# Patient Record
Sex: Female | Born: 1965
Health system: Southern US, Community
[De-identification: ages and names within clinical notes are randomized; demographics above are authoritative.]

## PROBLEM LIST (undated history)

## (undated) DIAGNOSIS — I10 Essential (primary) hypertension: Secondary | ICD-10-CM

## (undated) DIAGNOSIS — F329 Major depressive disorder, single episode, unspecified: Secondary | ICD-10-CM

## (undated) DIAGNOSIS — E669 Obesity, unspecified: Secondary | ICD-10-CM

## (undated) DIAGNOSIS — F32A Depression, unspecified: Secondary | ICD-10-CM

## (undated) DIAGNOSIS — M25559 Pain in unspecified hip: Secondary | ICD-10-CM

## (undated) DIAGNOSIS — Z9889 Other specified postprocedural states: Secondary | ICD-10-CM

## (undated) DIAGNOSIS — Z973 Presence of spectacles and contact lenses: Secondary | ICD-10-CM

## (undated) DIAGNOSIS — I219 Acute myocardial infarction, unspecified: Secondary | ICD-10-CM

## (undated) DIAGNOSIS — R7303 Prediabetes: Secondary | ICD-10-CM

## (undated) DIAGNOSIS — R112 Nausea with vomiting, unspecified: Secondary | ICD-10-CM

## (undated) DIAGNOSIS — R609 Edema, unspecified: Secondary | ICD-10-CM

## (undated) DIAGNOSIS — M549 Dorsalgia, unspecified: Secondary | ICD-10-CM

## (undated) DIAGNOSIS — C801 Malignant (primary) neoplasm, unspecified: Secondary | ICD-10-CM

## (undated) DIAGNOSIS — I2510 Atherosclerotic heart disease of native coronary artery without angina pectoris: Secondary | ICD-10-CM

## (undated) HISTORY — DX: Dorsalgia, unspecified: M54.9

## (undated) HISTORY — PX: COLONOSCOPY: SHX174

## (undated) HISTORY — DX: Pain in unspecified hip: M25.559

## (undated) HISTORY — DX: Prediabetes: R73.03

## (undated) HISTORY — DX: Essential (primary) hypertension: I10

## (undated) HISTORY — PX: CHOLECYSTECTOMY: SHX55

## (undated) HISTORY — DX: Edema, unspecified: R60.9

## (undated) HISTORY — PX: GASTRIC BYPASS: SHX52

## (undated) HISTORY — PX: DILATION AND CURETTAGE OF UTERUS: SHX78

## (undated) HISTORY — DX: Obesity, unspecified: E66.9

---
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
## 1997-04-16 ENCOUNTER — Other Ambulatory Visit: Admission: RE | Admit: 1997-04-16 | Discharge: 1997-04-16 | Payer: Self-pay | Admitting: Obstetrics & Gynecology

## 1997-04-17 ENCOUNTER — Ambulatory Visit (HOSPITAL_COMMUNITY): Admission: RE | Admit: 1997-04-17 | Discharge: 1997-04-17 | Payer: Self-pay | Admitting: Obstetrics & Gynecology

## 1997-09-13 ENCOUNTER — Emergency Department (HOSPITAL_COMMUNITY): Admission: EM | Admit: 1997-09-13 | Discharge: 1997-09-13 | Payer: Self-pay | Admitting: Emergency Medicine

## 1998-05-21 ENCOUNTER — Other Ambulatory Visit: Admission: RE | Admit: 1998-05-21 | Discharge: 1998-05-21 | Payer: Self-pay | Admitting: Obstetrics and Gynecology

## 1998-05-29 ENCOUNTER — Emergency Department (HOSPITAL_COMMUNITY): Admission: EM | Admit: 1998-05-29 | Discharge: 1998-05-29 | Payer: Self-pay | Admitting: Emergency Medicine

## 1999-05-18 ENCOUNTER — Other Ambulatory Visit: Admission: RE | Admit: 1999-05-18 | Discharge: 1999-05-18 | Payer: Self-pay | Admitting: Obstetrics and Gynecology

## 1999-09-07 ENCOUNTER — Emergency Department (HOSPITAL_COMMUNITY): Admission: EM | Admit: 1999-09-07 | Discharge: 1999-09-07 | Payer: Self-pay | Admitting: Emergency Medicine

## 1999-09-07 ENCOUNTER — Encounter: Payer: Self-pay | Admitting: Emergency Medicine

## 1999-10-28 ENCOUNTER — Encounter: Payer: Self-pay | Admitting: Orthopedic Surgery

## 1999-10-28 ENCOUNTER — Encounter: Admission: RE | Admit: 1999-10-28 | Discharge: 1999-10-28 | Payer: Self-pay | Admitting: Orthopedic Surgery

## 2000-01-03 HISTORY — PX: KNEE SURGERY: SHX244

## 2000-05-11 ENCOUNTER — Encounter: Admission: RE | Admit: 2000-05-11 | Discharge: 2000-05-11 | Payer: Self-pay | Admitting: Family Medicine

## 2000-05-11 ENCOUNTER — Encounter: Payer: Self-pay | Admitting: Family Medicine

## 2000-06-15 ENCOUNTER — Encounter (INDEPENDENT_AMBULATORY_CARE_PROVIDER_SITE_OTHER): Payer: Self-pay | Admitting: Specialist

## 2000-06-15 ENCOUNTER — Observation Stay (HOSPITAL_COMMUNITY): Admission: RE | Admit: 2000-06-15 | Discharge: 2000-06-16 | Payer: Self-pay | Admitting: Surgery

## 2001-09-16 ENCOUNTER — Other Ambulatory Visit: Admission: RE | Admit: 2001-09-16 | Discharge: 2001-09-16 | Payer: Self-pay | Admitting: Obstetrics and Gynecology

## 2002-07-09 ENCOUNTER — Encounter: Admission: RE | Admit: 2002-07-09 | Discharge: 2002-07-09 | Payer: Self-pay | Admitting: *Deleted

## 2002-07-21 ENCOUNTER — Encounter: Admission: RE | Admit: 2002-07-21 | Discharge: 2002-10-19 | Payer: Self-pay | Admitting: *Deleted

## 2002-10-13 ENCOUNTER — Other Ambulatory Visit: Admission: RE | Admit: 2002-10-13 | Discharge: 2002-10-13 | Payer: Self-pay | Admitting: Obstetrics and Gynecology

## 2003-05-25 ENCOUNTER — Encounter: Admission: RE | Admit: 2003-05-25 | Discharge: 2003-08-23 | Payer: Self-pay | Admitting: *Deleted

## 2003-06-08 ENCOUNTER — Inpatient Hospital Stay (HOSPITAL_COMMUNITY): Admission: RE | Admit: 2003-06-08 | Discharge: 2003-06-10 | Payer: Self-pay | Admitting: *Deleted

## 2003-10-28 ENCOUNTER — Encounter: Admission: RE | Admit: 2003-10-28 | Discharge: 2003-10-28 | Payer: Self-pay | Admitting: *Deleted

## 2004-02-02 ENCOUNTER — Encounter: Admission: RE | Admit: 2004-02-02 | Discharge: 2004-05-02 | Payer: Self-pay | Admitting: *Deleted

## 2004-03-29 ENCOUNTER — Other Ambulatory Visit: Admission: RE | Admit: 2004-03-29 | Discharge: 2004-03-29 | Payer: Self-pay | Admitting: Obstetrics and Gynecology

## 2004-04-22 ENCOUNTER — Emergency Department (HOSPITAL_COMMUNITY): Admission: EM | Admit: 2004-04-22 | Discharge: 2004-04-22 | Payer: Self-pay | Admitting: Emergency Medicine

## 2004-06-30 ENCOUNTER — Ambulatory Visit: Admission: RE | Admit: 2004-06-30 | Discharge: 2004-06-30 | Payer: Self-pay | Admitting: Obstetrics and Gynecology

## 2005-01-31 ENCOUNTER — Encounter: Admission: RE | Admit: 2005-01-31 | Discharge: 2005-01-31 | Payer: Self-pay | Admitting: Obstetrics and Gynecology

## 2005-07-04 ENCOUNTER — Ambulatory Visit (HOSPITAL_COMMUNITY): Admission: RE | Admit: 2005-07-04 | Discharge: 2005-07-04 | Payer: Self-pay | Admitting: Obstetrics and Gynecology

## 2006-04-04 ENCOUNTER — Encounter: Admission: RE | Admit: 2006-04-04 | Discharge: 2006-04-04 | Payer: Self-pay | Admitting: Obstetrics and Gynecology

## 2006-07-10 ENCOUNTER — Ambulatory Visit (HOSPITAL_COMMUNITY): Admission: RE | Admit: 2006-07-10 | Discharge: 2006-07-10 | Payer: Self-pay | Admitting: Obstetrics and Gynecology

## 2007-06-25 ENCOUNTER — Emergency Department (HOSPITAL_COMMUNITY): Admission: EM | Admit: 2007-06-25 | Discharge: 2007-06-25 | Payer: Self-pay | Admitting: Emergency Medicine

## 2007-07-18 ENCOUNTER — Ambulatory Visit (HOSPITAL_COMMUNITY): Admission: RE | Admit: 2007-07-18 | Discharge: 2007-07-18 | Payer: Self-pay | Admitting: Obstetrics and Gynecology

## 2008-07-30 ENCOUNTER — Ambulatory Visit (HOSPITAL_COMMUNITY): Admission: RE | Admit: 2008-07-30 | Discharge: 2008-07-30 | Payer: Self-pay | Admitting: Obstetrics and Gynecology

## 2009-08-03 ENCOUNTER — Ambulatory Visit (HOSPITAL_COMMUNITY): Admission: RE | Admit: 2009-08-03 | Discharge: 2009-08-03 | Payer: Self-pay | Admitting: Obstetrics and Gynecology

## 2010-01-22 ENCOUNTER — Encounter: Payer: Self-pay | Admitting: Surgery

## 2010-01-24 ENCOUNTER — Encounter: Payer: Self-pay | Admitting: Obstetrics and Gynecology

## 2010-05-20 NOTE — Op Note (Signed)
Roberta [name], Roberta [name]                          ACCOUNT NO.:  [id number]   MEDICAL RECORD NO.:  [id number]                   PATIENT TYPE:  INP   LOCATION:  X001                                 FACILITY:  [name]   PHYSICIAN:  Vikki Ports, M.D.         DATE OF BIRTH:  01/27/1965   DATE OF PROCEDURE:  06/08/2003  DATE OF DISCHARGE:                                 OPERATIVE REPORT   PREOPERATIVE DIAGNOSIS:  Morbid obesity.   POSTOPERATIVE DIAGNOSIS:  Morbid obesity.   OPERATION/PROCEDURE:  Laparoscopic Roux-en-Y gastric bypass, antegastric,  antecolic.   SURGEON:  Vikki Ports, M.D.   ASSISTANT:  Sandria Bales. Ezzard Standing, M.D.   ANESTHESIA:  General.   DESCRIPTION OF PROCEDURE:  The patient was taken to the operating room and  placed in the supine position.  After adequate general anesthesia was  induced using endotracheal tube, the abdomen was prepped and draped in the  normal sterile fashion.  Using a left upper quadrant 10 mm incision, a 12 mm  trocar was placed using the Optiview technique.  Pneumoperitoneum was  obtained.  Additional two 12 mm trocars were placed in the right upper  quadrant and another one in the left mid abdomen.  A 5 mm trocar was placed  on the left lateral abdomen.  The ligament of Treitz was identified and the  bowel was run to 40 cm and divided using a white load GIA stapling device.  Mesentery was divided slightly using Harmonic scalpel.  Distal end was  tacked using a Penrose drain.  I then counted 100 cm distal to this,  bringing up the area of jejunum.  I did a side-to-side jejunojejunostomy in  the standard fashion using a GIA stapling device.  Defect was closed with a  running 2-0 Vicryl suture.  The anastomosis was reinforced suing Tisseel.  The Roux limb was easily mobilized and went up to the EG junction.   Nathenson liver retractor was then placed through a 5 mm incision in the  upper abdomen.  The area on the lesser curve was  identified and opened using  a Harmonic scalpel into the lesser sac.  The stomach was then divided using  four 60 mm blue load GIA stapling device.  The EG junction was opened.  This  created a very nice small gastric pouch.  A gastric posterior serosal layer  approximating the gastric pouch to the Roux limb was accomplished using a  running 2-0 Vicryl suture.  Gastrotomy and enterotomy were then made using  the Harmonic scalpel and gastrojejunostomy was accomplished with a 45 mm  blue load GIA stapler.  Defect was closed with running 2-0 Vicryl suture.  Anterior serosal layer was performed with the Ewald through the anastomosis.  Upper abdomen was then irrigated.  Upper endoscopy was performed by Dr.  Ezzard Standing which showed patent anastomosis and no evidence of leak.  The  anastomosis was reinforced using Tisseel.  Drain was placed through the  gastrogastrostomy.  Mesenteric defect of the jejunojejunostomy was closed  with a running 2-0 silk.  Pneumoperitoneum was released.  Skin incisions  were closed with staples and injected with Marcaine.  The patient tolerated  the procedure well and went to PACU in good condition.                                              Vikki Ports, M.D.   KRH/MEDQ  D:  06/08/2003  T:  06/08/2003  Job:  [id number]

## 2010-05-20 NOTE — Discharge Summary (Signed)
NAMECECELY, RENGEL                          ACCOUNT NO.:  [id number]   MEDICAL RECORD NO.:  [id number]                   PATIENT TYPE:  INP   LOCATION:  0482                                 [facility]ACILITY:  [facility]   PHYSICIAN:  Vikki Ports, M.D.         DATE OF BIRTH:  12/11/1965   DATE OF ADMISSION:  06/08/2003  DATE OF DISCHARGE:  06/10/2003                                 DISCHARGE SUMMARY   ADMISSION DIAGNOSIS:  Morbid obesity.   DISCHARGE DIAGNOSIS:  Morbid obesity.   CONDITION ON DISCHARGE:  Good.   FOLLOW UP:  With me five days after discharge.   DISPOSITION:  Discharge home.   HOSPITAL COURSE:  Patient was admitted and underwent laparoscopic Roux-en-Y  bypass.  She did well.  Postoperatively, she had upper GI.  On postoperative  day #1, was started on a clear-liquid diet.  She also underwent Doppler  studies which showed no evidence of DVT.  Upper GI showed no evidence of  leak and a patent anastomosis.  She tolerated clear liquids well.   By postoperative day #2, she was advanced to include Ensure.  She was  tolerating that well.  She had minimal pain and was ready for discharge  home.  She was discharged home to follow up with me in five days.  Wound  care instructions and drain care instructions were given.                                               Vikki Ports, M.D.    KRH/MEDQ  D:  06/24/2003  T:  06/25/2003  Job:  [id number]

## 2010-05-20 NOTE — Op Note (Signed)
[facility]  Patient:    Roberta [name], Roberta [name]                         MRN: [id number] Proc. Date: 06/15/00 Attending:  Abigail Miyamoto, M.D.                           Operative Report  PREOPERATIVE DIAGNOSIS:  Symptomatic cholelithiasis.  POSTOPERATIVE DIAGNOSIS:  Symptomatic cholelithiasis.  OPERATION:  Laparoscopic cholecystectomy  SURGEON:  Abigail Miyamoto, M.D.  ASSISTANT:  Chevis Pretty, M.D.  ANESTHESIA:  General endotracheal anesthesia.  ESTIMATED BLOOD LOSS:  Minimal.  PROCEDURE IN DETAIL:  The patient was brought to the operating room and identified as Roberta [name].  She was placed supine on the operating table, and general anesthesia was induced.  Her abdomen was then prepped and draped in the usual sterile fashion.  Using a #15 blade, a small transverse incision was below the umbilicus.  The incision was carried down to the fascia which was then opened with a scalpel.  A small umbilical hernia was identified just above this incision, and the incision was extended into this.  Next, a 0 Vicryl pursestring suture was placed around the fascial opening.  The Hasson port was then placed through the opening after Hemostat was used into the peritoneum cavity.  Insufflation of abdomen was begun.  Next, an 11 mm port was placed in the patients epigastrium, and two 5 mm ports were placed in the patients right flank all under direct vision.  The gallbladder was then grasped and retracted above the liver bed.  Dissection was then carried out at the base.  The cystic duct was dissected out along with the cystic artery. The cystic artery was first clipped twice proximally and once distally and transected with scissors.  The second branch of vessels was clipped once proximally and transected as well.  The cystic stump was then further dissected out.  It was then clipped twice proximally and once distally and transected with scissors.  The gallbladder was then  dissected free from the liver bed with the electrocautery.  Hemostasis appeared to be achieved in the liver bed with the cautery.  The gallbladder was then grasped and removed through the incision at the umbilicus.  The 0 Vicryl in the umbilicus was then tied in place.  Two separate 0 Vicryl sutures were placed at the umbilicus for fascial closure as well.  The abdomen was then irrigated with normal saline. Hemostasis appeared to be achieved.  All ports were then removed under direct vision, and the abdomen was deflated.  All incisions were then anesthetized with 0.25% Marcaine and closed with 4-0 Monocryl subcuticular sutures. Steri-Strips, gauze, and tape were then applied.  The patient tolerated the procedure well.   All sponge, needle, and instrument counts were correct at the end of the procedure.  The patient was then extubated in the operating room and taken in stable condition to the recovery room. DD:  06/15/00 TD:  06/15/00 Job: [id number]

## 2010-05-20 NOTE — Op Note (Signed)
[name], [name]                          ACCOUNT NO.:  [id number]   MEDICAL RECORD NO.:  [id number]                   PATIENT TYPE:  INP   LOCATION:  X001                                 FACILITY:  [facility]   PHYSICIAN:  Sandria Bales. Ezzard Standing, M.D.               DATE OF BIRTH:  [birth year]   DATE OF PROCEDURE:  06/08/2003  DATE OF DISCHARGE:                                 OPERATIVE REPORT   PREOPERATIVE DIAGNOSIS:  Morbid obesity with body mass index of 52.   POSTOPERATIVE DIAGNOSIS:  1. Morbid obesity with body mass index of 52.  2. Patent gastrojejunal anastomosis.   OPERATION/PROCEDURE:  Esophagogastroscopy.   SURGEON:  Sandria Bales. Ezzard Standing, M.D.   FIRST ASSISTANT:  None.   ANESTHESIA:  General endotracheal anesthesia.   ESTIMATED BLOOD LOSS:  None.   INDICATIONS FOR PROCEDURE:  Mrs. Roberta [name] is a 45 year old black female who  has a BMI of 52 and is undergoing a laparoscopic Roux-en-Y gastrojejunostomy  by Dr. Vikki Ports, M.D. for her morbid obesity.  Dr. Luan Pulling  is manning the laparoscope while I am doing the upper endoscopy to check her  anastomosis, make sure there is no bleeding from her pouch and make sure  there is no leak.   DESCRIPTION OF PROCEDURE:  A flexible Olympus endoscope was passed without  difficulty down the back of [name]'s throat into  her stomach pouch.  Her gastrojejunal anastomosis was visualized at 47 cm.  There was no  bleeding from the anastomosis.  It was patent.  I took photos of this. While  I was at the anastomosis, I insufflated the stomach.  Dr. Luan Pulling had  clamped off the jejunal limb and we insufflated this while she flooded the  upper abdomen.  There was no bleeding from the staple line and the stomach.   The scope was then withdrawn and the GE junction noticed at about 40-41 cm,  so the patient had an approximately 6 cm length pouch.  The scope was then  withdrawn into the esophagus which was unremarkable.  The patient  tolerated  the procedure well, had a patent anastomosis without leak, without bleeding  and Dr. Luan Pulling will dictate the majority of the Roux-en-Y  gastrojejunostomy surgery.                                               Sandria Bales. Ezzard Standing, M.D.    DHN/MEDQ  D:  06/08/2003  T:  06/08/2003  Job:  [id number]   cc:   Vikki Ports, M.D.  [address], [address]  [place]  [place] [address]  Fax: [phone]

## 2010-07-14 ENCOUNTER — Other Ambulatory Visit (HOSPITAL_COMMUNITY): Payer: Self-pay | Admitting: Obstetrics and Gynecology

## 2010-07-14 DIAGNOSIS — Z1231 Encounter for screening mammogram for malignant neoplasm of breast: Secondary | ICD-10-CM

## 2010-08-09 ENCOUNTER — Ambulatory Visit (HOSPITAL_COMMUNITY)
Admission: RE | Admit: 2010-08-09 | Discharge: 2010-08-09 | Disposition: A | Discharge status: Home / Self Care | Payer: BC Managed Care – PPO | Source: Ambulatory Visit | Attending: Obstetrics and Gynecology | Admitting: Obstetrics and Gynecology

## 2010-08-09 ENCOUNTER — Ambulatory Visit (HOSPITAL_COMMUNITY): Payer: Self-pay

## 2010-08-09 DIAGNOSIS — Z1231 Encounter for screening mammogram for malignant neoplasm of breast: Secondary | ICD-10-CM | POA: Insufficient documentation

## 2010-09-29 LAB — DIFFERENTIAL
Basophils Absolute: 0.1
Basophils Relative: 1
Eosinophils Absolute: 0.2
Eosinophils Relative: 2
Lymphocytes Relative: 26
Lymphs Abs: 1.8
Monocytes Absolute: 0.6
Monocytes Relative: 8
Neutro Abs: 4.5
Neutrophils Relative %: 63

## 2010-09-29 LAB — CBC
HCT: 35.5 — ABNORMAL LOW
Hemoglobin: 11.6 — ABNORMAL LOW
MCHC: 32.5
MCV: 80.7
Platelets: 236
RBC: 4.4
RDW: 18.9 — ABNORMAL HIGH
WBC: 7.1

## 2010-09-29 LAB — URINALYSIS, ROUTINE W REFLEX MICROSCOPIC
Bilirubin Urine: NEGATIVE
Glucose, UA: NEGATIVE
Ketones, ur: NEGATIVE
Nitrite: POSITIVE — AB
Protein, ur: 100 — AB
Specific Gravity, Urine: 1.016
Urobilinogen, UA: 0.2
pH: 6

## 2010-09-29 LAB — URINE MICROSCOPIC-ADD ON

## 2010-09-29 LAB — POCT I-STAT, CHEM 8
BUN: 12
Calcium, Ion: 1.13
Chloride: 106
Creatinine, Ser: 1.1
Glucose, Bld: 100 — ABNORMAL HIGH
HCT: 36
Hemoglobin: 12.2
Potassium: 3.8
Sodium: 138
TCO2: 24

## 2010-09-29 LAB — POCT PREGNANCY, URINE
Operator id: 27065
Preg Test, Ur: NEGATIVE

## 2011-04-05 ENCOUNTER — Ambulatory Visit (INDEPENDENT_AMBULATORY_CARE_PROVIDER_SITE_OTHER): Payer: BC Managed Care – PPO | Admitting: Obstetrics and Gynecology

## 2011-04-05 DIAGNOSIS — Z124 Encounter for screening for malignant neoplasm of cervix: Secondary | ICD-10-CM

## 2011-04-05 DIAGNOSIS — R300 Dysuria: Secondary | ICD-10-CM

## 2011-04-05 DIAGNOSIS — N390 Urinary tract infection, site not specified: Secondary | ICD-10-CM

## 2011-04-05 DIAGNOSIS — Z01419 Encounter for gynecological examination (general) (routine) without abnormal findings: Secondary | ICD-10-CM

## 2011-04-13 ENCOUNTER — Telehealth: Payer: Self-pay | Admitting: Obstetrics and Gynecology

## 2011-04-14 NOTE — Telephone Encounter (Signed)
TC TO PT PER TELEPHONE CALL. LM ON VM TO CB

## 2011-04-17 ENCOUNTER — Other Ambulatory Visit: Payer: Self-pay | Admitting: Obstetrics and Gynecology

## 2011-04-17 ENCOUNTER — Other Ambulatory Visit: Payer: Self-pay

## 2011-04-17 ENCOUNTER — Encounter: Payer: Self-pay | Admitting: Obstetrics and Gynecology

## 2011-04-17 DIAGNOSIS — R300 Dysuria: Secondary | ICD-10-CM

## 2011-04-17 NOTE — Telephone Encounter (Signed)
PT C/O "DISCOMFORT WITH URINATION, BUT NOT NECESSARILY PAIN". ADVISED PT TO INC WATER AND CRANBERRY JUICE INTAKE. PT AGREES

## 2011-04-17 NOTE — Telephone Encounter (Signed)
TC FROM PT. PT REQ URINE CULTURE RESULTS. TOLD PT URINE NOT SENT FOR CULTURE. APOLOGIZED TO PT FOR INCONVENIENCE.  APPT SCHED 04-18-11 @2 :30 FOR LAB ONLY APPT FOR URINE CULTURE. PT AGREES. DENIES FEVER OR DYSURIA.

## 2011-04-17 NOTE — Telephone Encounter (Signed)
LM ON VM TO CB PER TELEPHONE CALL.  

## 2011-04-18 ENCOUNTER — Other Ambulatory Visit: Payer: BC Managed Care – PPO

## 2011-04-18 ENCOUNTER — Telehealth: Payer: Self-pay | Admitting: Obstetrics and Gynecology

## 2011-04-19 ENCOUNTER — Other Ambulatory Visit: Payer: BC Managed Care – PPO

## 2011-04-24 ENCOUNTER — Other Ambulatory Visit: Payer: BC Managed Care – PPO

## 2011-04-24 DIAGNOSIS — R300 Dysuria: Secondary | ICD-10-CM

## 2011-04-24 DIAGNOSIS — N390 Urinary tract infection, site not specified: Secondary | ICD-10-CM

## 2011-04-26 LAB — URINE CULTURE: Colony Count: >=100000

## 2011-05-02 ENCOUNTER — Telehealth: Payer: Self-pay | Admitting: Obstetrics and Gynecology

## 2011-05-02 ENCOUNTER — Telehealth: Payer: Self-pay

## 2011-05-02 MED ORDER — SULFAMETHOXAZOLE-TRIMETHOPRIM 800-160 MG PO TABS: ORAL_TABLET | ORAL | Status: DC

## 2011-05-02 NOTE — Telephone Encounter (Signed)
Pc to pt rgdg positive urine culture results. Told pt needs Septra DS 1 tab po bid x3d #6 with no rf's. Rx called to pharm on file. Informed pt to inc water intake. Needs toc 2wks after completing meds. Pt states," will cb to sched due to being in class at this time". Pt voices understanding.

## 2011-05-03 NOTE — Telephone Encounter (Signed)
See telephone note. Thanks

## 2011-05-04 ENCOUNTER — Telehealth: Payer: Self-pay | Admitting: Obstetrics and Gynecology

## 2011-05-16 ENCOUNTER — Other Ambulatory Visit: Payer: BC Managed Care – PPO

## 2011-05-16 DIAGNOSIS — R300 Dysuria: Secondary | ICD-10-CM

## 2011-05-18 LAB — URINE CULTURE
Colony Count: NO GROWTH
Organism ID, Bacteria: NO GROWTH

## 2011-06-13 ENCOUNTER — Encounter: Payer: BC Managed Care – PPO | Admitting: Obstetrics and Gynecology

## 2011-06-22 ENCOUNTER — Ambulatory Visit (HOSPITAL_COMMUNITY)
Admission: RE | Admit: 2011-06-22 | Payer: BC Managed Care – PPO | Source: Ambulatory Visit | Admitting: Obstetrics and Gynecology

## 2011-06-22 ENCOUNTER — Encounter (HOSPITAL_COMMUNITY): Admission: RE | Payer: Self-pay | Source: Ambulatory Visit

## 2011-06-22 SURGERY — LIGATION, FALLOPIAN TUBE, LAPAROSCOPIC
Anesthesia: General

## 2011-07-04 ENCOUNTER — Encounter: Payer: BC Managed Care – PPO | Admitting: Obstetrics and Gynecology

## 2011-08-21 ENCOUNTER — Other Ambulatory Visit: Payer: Self-pay | Admitting: Obstetrics and Gynecology

## 2011-08-21 DIAGNOSIS — Z1231 Encounter for screening mammogram for malignant neoplasm of breast: Secondary | ICD-10-CM

## 2011-09-06 ENCOUNTER — Ambulatory Visit (HOSPITAL_COMMUNITY): Payer: BC Managed Care – PPO | Attending: Obstetrics and Gynecology

## 2011-10-16 ENCOUNTER — Ambulatory Visit (INDEPENDENT_AMBULATORY_CARE_PROVIDER_SITE_OTHER): Payer: BC Managed Care – PPO | Admitting: Family Medicine

## 2011-10-16 VITALS — BP 119/79 | HR 73 | Temp 98.3°F | Resp 16 | Ht 66.0 in | Wt 273.0 lb

## 2011-10-16 DIAGNOSIS — M549 Dorsalgia, unspecified: Secondary | ICD-10-CM

## 2011-10-16 DIAGNOSIS — T148XXA Other injury of unspecified body region, initial encounter: Secondary | ICD-10-CM

## 2011-10-16 MED ORDER — METHOCARBAMOL 500 MG PO TABS: 500.0000 mg | ORAL_TABLET | Freq: Every evening | ORAL | Status: DC | PRN

## 2011-10-16 MED ORDER — TRAMADOL HCL 50 MG PO TABS: 50.0000 mg | ORAL_TABLET | Freq: Four times a day (QID) | ORAL | Status: DC | PRN

## 2011-10-16 MED ORDER — NABUMETONE 500 MG PO TABS: 500.0000 mg | ORAL_TABLET | Freq: Two times a day (BID) | ORAL | Status: DC

## 2011-10-16 NOTE — Progress Notes (Signed)
[facility]:  Office Visit  Chief Complaint:  Chief Complaint  Patient presents with  . Spasms    Low back since Sat; NKI    HPI: Roberta [name] is a 46 y.o. female who complains of  one week h/o back spasm, occurred at home while in front of kitchen sink.  NKI. No numbness or tingling. No incontinence. She denies any prior back pain, any back surgeries or recent heavy lifting/pulling or pushing. The apin is localized more to the right lower back but is also on the left, sharp 7-10/10 pain, worse with any ROM. She is a [employer] and would prefer not to be out of work due to back pain.   Past Medical History  Diagnosis Date  . Hypertension    Past Surgical History  Procedure Date  . Cholecystectomy   . Gastric bypass    History   Social History  . Marital Status: Legally Separated    Spouse Name: N/A    Number of Children: N/A  . Years of Education: N/A   Social History Main Topics  . Smoking status: Never Smoker   . Smokeless tobacco: None  . Alcohol Use: None  . Drug Use: None  . Sexually Active: None   Other Topics Concern  . None   Social History Narrative  . None   No family history on file. Allergies  Allergen Reactions  . Lisinopril Swelling    Swelling of the tongue  . Penicillins     Pt told as a child-unknown reaction.  . Tetanus Toxoids Swelling    Swelling at site of injection  . Norvasc (Amlodipine Besylate) Rash   Prior to Admission medications   Medication Sig Start Date End Date Taking? Authorizing Provider  Olmesartan-Amlodipine-HCTZ (TRIBENZOR) 20-5-12.5 MG TABS Take by mouth.   Yes Historical Provider, MD     ROS: The patient denies fevers, chills, night sweats, unintentional weight loss, chest pain, palpitations, wheezing, dyspnea on exertion, nausea, vomiting, abdominal pain, dysuria, hematuria, melena, numbness, weakness, or tingling.   All other systems have been reviewed and were otherwise negative with the  exception of those mentioned in the HPI and as above.    PHYSICAL EXAM: Filed Vitals:   10/16/11 2034  BP: 119/79  Pulse: 73  Temp: 98.3 F (36.8 C)  Resp: 16   Filed Vitals:   10/16/11 2034  Height: 5\' 6"  (1.676 m)  Weight: 273 lb (123.832 kg)   Body mass index is 44.06 kg/(m^2).  General: Alert, no acute distress, obese AA female HEENT:  Normocephalic, atraumatic, oropharynx patent.  Cardiovascular:  Regular rate and rhythm, no rubs murmurs or gallops.  No Carotid bruits, radial pulse intact. No pedal edema.  Respiratory: Clear to auscultation bilaterally.  No wheezes, rales, or rhonchi.  No cyanosis, no use of accessory musculature GI: No organomegaly, abdomen is soft and non-tender, positive bowel sounds.  No masses. Skin: No rashes. Neurologic: Facial musculature symmetric. Psychiatric: Patient is appropriate throughout our interaction. Lymphatic: No cervical lymphadenopathy Musculoskeletal: Gait intact. Decrease ROM due to pain Ender on right LB 5/5 strength, 2/2 DTRs, sensation intact + DP, neg. Straight leg Hips nl   LABS: Results for orders placed in visit on 05/16/11  URINE CULTURE      Component Value Range   Colony Count NO GROWTH     Organism ID, Bacteria NO GROWTH       EKG/XRAY:   Primary read interpreted by Dr. Conley Rolls at [facility].  ASSESSMENT/PLAN: Encounter Diagnoses  Name Primary?  . Back pain Yes  . Sprain and strain    Will defer Xrays since NKI Rx Robaxin, Relafen, Tramadol Would rather not take narcotics or drugs that cause her to be sleepy If she continues to have pain consider xray and rx prednisone taper    Spence Soberano PHUONG, DO 10/17/2011 8:50 AM

## 2011-11-09 ENCOUNTER — Ambulatory Visit (HOSPITAL_COMMUNITY)
Admission: RE | Admit: 2011-11-09 | Discharge: 2011-11-09 | Disposition: A | Discharge status: Home / Self Care | Payer: BC Managed Care – PPO | Source: Ambulatory Visit | Attending: Obstetrics and Gynecology | Admitting: Obstetrics and Gynecology

## 2011-11-09 DIAGNOSIS — Z1231 Encounter for screening mammogram for malignant neoplasm of breast: Secondary | ICD-10-CM | POA: Insufficient documentation

## 2011-11-10 ENCOUNTER — Ambulatory Visit (INDEPENDENT_AMBULATORY_CARE_PROVIDER_SITE_OTHER): Payer: BC Managed Care – PPO | Admitting: Surgery

## 2011-11-10 ENCOUNTER — Encounter (INDEPENDENT_AMBULATORY_CARE_PROVIDER_SITE_OTHER): Payer: Self-pay | Admitting: Surgery

## 2011-11-10 VITALS — BP 132/74 | HR 76 | Temp 97.8°F | Resp 16 | Ht 66.0 in | Wt 272.4 lb

## 2011-11-10 DIAGNOSIS — Z9884 Bariatric surgery status: Secondary | ICD-10-CM

## 2011-11-10 DIAGNOSIS — I10 Essential (primary) hypertension: Secondary | ICD-10-CM

## 2011-11-10 NOTE — Progress Notes (Signed)
Roberta [name] is a 46 year old African American lady followed by Dr. Leontine Locket who underwent a gastric bypass by Dr. Colin Benton in June of 2005. It preop weight was 350 and today's weight is 272.6. She is finding that she is able to be more he doesn't feel restriction and was inquiring about her pouch size and whether it should be revised.  I discussed getting her over to see Maralyn Sago and perhaps we will do that in the future. At the present time I will arrange for her to have an upper GI series so that we can assess her pouch size and I will it empties. Based on that we may make recommendations redo regarding further resectional therapy or lap band of her bypass.

## 2011-11-13 ENCOUNTER — Other Ambulatory Visit: Payer: Self-pay | Admitting: Obstetrics and Gynecology

## 2011-11-13 DIAGNOSIS — R928 Other abnormal and inconclusive findings on diagnostic imaging of breast: Secondary | ICD-10-CM

## 2011-11-29 ENCOUNTER — Ambulatory Visit
Admission: RE | Admit: 2011-11-29 | Discharge: 2011-11-29 | Disposition: A | Discharge status: Home / Self Care | Payer: BC Managed Care – PPO | Source: Ambulatory Visit | Attending: Obstetrics and Gynecology | Admitting: Obstetrics and Gynecology

## 2011-11-29 ENCOUNTER — Ambulatory Visit
Admission: RE | Admit: 2011-11-29 | Discharge: 2011-11-29 | Disposition: A | Discharge status: Home / Self Care | Payer: BC Managed Care – PPO | Source: Ambulatory Visit | Attending: Surgery | Admitting: Surgery

## 2011-11-29 ENCOUNTER — Other Ambulatory Visit (INDEPENDENT_AMBULATORY_CARE_PROVIDER_SITE_OTHER): Payer: Self-pay | Admitting: Surgery

## 2011-11-29 DIAGNOSIS — Z9884 Bariatric surgery status: Secondary | ICD-10-CM

## 2011-11-29 DIAGNOSIS — R928 Other abnormal and inconclusive findings on diagnostic imaging of breast: Secondary | ICD-10-CM

## 2011-12-13 ENCOUNTER — Encounter: Payer: Self-pay | Admitting: Obstetrics and Gynecology

## 2011-12-13 DIAGNOSIS — R922 Inconclusive mammogram: Secondary | ICD-10-CM | POA: Insufficient documentation

## 2011-12-21 ENCOUNTER — Encounter (INDEPENDENT_AMBULATORY_CARE_PROVIDER_SITE_OTHER): Payer: Self-pay | Admitting: Surgery

## 2011-12-21 ENCOUNTER — Ambulatory Visit (INDEPENDENT_AMBULATORY_CARE_PROVIDER_SITE_OTHER): Payer: BC Managed Care – PPO | Admitting: Surgery

## 2011-12-21 VITALS — BP 128/82 | HR 76 | Temp 98.8°F | Resp 16 | Ht 66.0 in | Wt 270.8 lb

## 2011-12-21 DIAGNOSIS — Z1329 Encounter for screening for other suspected endocrine disorder: Secondary | ICD-10-CM

## 2011-12-21 LAB — T4: T4, Total: 8.8 ug/dL (ref 5.0–12.5)

## 2011-12-21 LAB — T3: T3, Total: 142.7 ng/dL (ref 80.0–204.0)

## 2011-12-21 LAB — TSH: TSH: 1.79 u[IU]/mL (ref 0.350–4.500)

## 2011-12-21 NOTE — Patient Instructions (Addendum)
Diet Following Bariatric Surgery The bariatric diet is designed to provide fluids and nourishment while promoting weight loss after bariatric surgery. The diet is divided into 3 stages. The rate of progression varies based on individual food tolerance. DIET FOLLOWING BARIATRIC SURGERY The diet following surgery is divided into 3 stages to allow a gradual adjustment. It is very important to the success of your surgery to:  Progress to each stage slowly.  Eat at set times.  Chew food well and stop eating when you are full.  Not drink liquids 30 minutes before and after meals. If you feel tightness or pressure in your chest, that means you are full. Wait 30 minutes before you try to eat again. STAGE 1 BARIATRIC DIET - ABOUT 2 WEEKS IN DURATION   The diet begins the day of surgery. It will last about 1 to 2 weeks after surgery. Your surgeon may have individual guidelines for you about specific foods or the progression of your diet. Follow your surgeon's guidelines.  If clear liquids are well-tolerated without vomiting, your caregiver will add a 4 oz to 6 oz high protein, low-calorie liquid supplement. You could add this to your meal plan 3 times daily. You will need at least 60 g to 80 g of protein daily or as determined by your Registered Dietitian.  Guidelines for choosing a protein supplement include:  At least 15 g of protein per 8 oz serving.  Less than 20 g total carbohydrate per 8 oz serving.  Less than 5 g fat per 8 oz serving.  Avoid carbonated beverages, caffeine, alcohol, and concentrated sweets such as sugar, cakes, and cookies.  Right after surgery, you may only be able to eat 3 to 4 tsp per meal. Your maximum volume should not exceed  to  cup total. Do not eat or drink more than 1 oz or 2 tbs every 15 minutes.  Take a chewable multivitamin and mineral supplement.  Drink at least 48 oz of fluid daily, which includes your protein supplement. Food and beverages from the  list below are allowed at set times (for example at 8 AM, 12 noon, or 5 PM):  Decaffeinated coffee or tea.  100% fruit juice.  Diet or sugar-free drinks.  Broth.  Blenderized soup.  Skim milk or lactose-free milk.  Sugar-free gelatin dessert or frozen ice pops.  Mashed potatoes.  Yogurt (artificially sweetened).  Sugar-free pudding.  Blended low-fat cottage cheese.  Unsweetened applesauce, grits, or hot wheat cereal. Four to six ounces of a liquid protein supplement from the list below is recommended for snacks at 10 AM, 2 PM, and 8 PM.  STAGE 2 BARIATRIC DIET (SOFT DIET) - ABOUT 4 WEEKS IN DURATION  About 2 weeks after surgery, your caregiver will progress your diet to this stage. Foods may need to be blended to the consistency of applesauce. Choose low-fat foods (less than 5 g of fat per serving) and avoid concentrated sweets and sugar (less than 10 g of sugar per serving). Meals should not exceed  to  cup total. This stage will last about 4 weeks. It is recommended that you meet with your dietitian at this stage to begin preparation for the last stage. This stage consists of 3 meals a day with a liquid protein supplement between meals twice daily. Do not drink liquids with foods. You must wait 30 minutes for the stomach pouch to empty before drinking. Chew food well. The food must be almost liquified before swallowing. Soft foods from the   list below can now be slowly added to your diet:  Soft fruit (soft canned fruit in light syrup or natural juice, banana, melon, peaches, pears, or strawberries).  Cooked vegetables.  Toast or crackers (becomes soft after chewing 20 times).  Hot wheat cereal.  Fish.  Eggs (scrambled, soft-boiled). STAGE 3 BARIATRIC DIET (REGULAR DIET) - ABOUT 6 to 8 WEEKS AFTER SURGERY About 6 to 8 weeks after surgery, you will be advanced to food that is regular in texture. This diet should include all food groups. The diet will continue to promote  weight loss. Meals should not exceed  to 1 cup total. Your dietitian will be available to assist you in meal planning and additional behavioral strategies to make this final stage a long-term success. Slowly add foods of regular consistency and remember:  Eat only at your chosen meal times.  Minimize drinking with meals. You should drink 30 minutes before eating. Do not start drinking again for about 2 hours after eating.  Chew food well. Take small bites.  Think about the portion size of a healthy frozen meal. You will be able to eat most of this.  Make sure your meal is balanced with starch, protein, fruits, and vegetables.  When you feel full, stop eating. Document Released: 06/25/2002 Document Revised: 03/13/2011 Document Reviewed: 03/18/2010 [facility] Patient Information 2013 [place], [place].

## 2011-12-21 NOTE — Progress Notes (Signed)
Roberta [name] 46 y.o.  Body mass index is 43.71 kg/(m^2).  Patient Active Problem List  Diagnosis  . 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 Hypertension  . Dense breasts    Allergies  Allergen Reactions  . Lisinopril Swelling    Swelling of the tongue  . Penicillins     Pt told as a child-unknown reaction.  . Tetanus Toxoids Swelling    Swelling at site of injection  . Norvasc (Amlodipine Besylate) Rash    Past Surgical History  Procedure Date  . Cholecystectomy   . Gastric bypass    SANDERS,ROBYN N, MD No diagnosis found.  Ms. Roberta [name] came in today and I reviewed her upper GI series of November 27. Her pouch is small at 8 x 4 cm. I think she has the tool but has spelled out in its use. She has lost approximately 70 pounds from her preoperative weight and is That off but she wants to be under 200 pounds which means she needs to lose another 70. Her weight today is 270 with a BMI of 44.  She's never had her thyroid functions checked. Thyroid exam reveals a possible nodule in the right so we'll also get a thyroid ultrasound. In addition I would get a hypothyroid panel. I think she needs to see Huntley Dec Himmelrich for a dietary consult.  Matt B. Daphine Deutscher, MD, [facility] [phone] beeper [phone]  12/21/2011 9:21 AM

## 2011-12-22 ENCOUNTER — Other Ambulatory Visit: Payer: BC Managed Care – PPO

## 2011-12-22 ENCOUNTER — Telehealth (INDEPENDENT_AMBULATORY_CARE_PROVIDER_SITE_OTHER): Payer: Self-pay

## 2011-12-22 NOTE — Telephone Encounter (Signed)
LMOM letting pt know of her f/u appt with MM - Thurs 01/10/12 @ 215p.

## 2012-01-02 ENCOUNTER — Ambulatory Visit
Admission: RE | Admit: 2012-01-02 | Discharge: 2012-01-02 | Disposition: A | Discharge status: Home / Self Care | Payer: BC Managed Care – PPO | Source: Ambulatory Visit | Attending: Surgery | Admitting: Surgery

## 2012-01-02 DIAGNOSIS — Z1329 Encounter for screening for other suspected endocrine disorder: Secondary | ICD-10-CM

## 2012-01-04 ENCOUNTER — Encounter: Payer: Self-pay | Admitting: *Deleted

## 2012-01-04 ENCOUNTER — Encounter: Payer: BC Managed Care – PPO | Attending: Surgery | Admitting: *Deleted

## 2012-01-04 VITALS — Ht 66.0 in | Wt 274.5 lb

## 2012-01-04 DIAGNOSIS — Z713 Dietary counseling and surveillance: Secondary | ICD-10-CM | POA: Insufficient documentation

## 2012-01-04 DIAGNOSIS — Z09 Encounter for follow-up examination after completed treatment for conditions other than malignant neoplasm: Secondary | ICD-10-CM | POA: Insufficient documentation

## 2012-01-04 DIAGNOSIS — Z9884 Bariatric surgery status: Secondary | ICD-10-CM | POA: Insufficient documentation

## 2012-01-04 DIAGNOSIS — E669 Obesity, unspecified: Secondary | ICD-10-CM

## 2012-01-04 NOTE — Progress Notes (Addendum)
  Follow-up visit:  8 Years Post-Operative RYGB Surgery  Medical Nutrition Therapy:  Appt start time: 1545  End time:  1715.  Primary concerns today: Post-operative RYGB Nutrition Refresher. Roberta [name] comes today for RYGB nutrition refresher. States she can eat sweets with no problems and has noticed times when she can eat more than others; likely d/t drinking with meals. EGD shows no stretching of the pouch. Dietary recall reveals excessive CHO intake. 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   Surgery date: 06/2003 Start weight: 349.5 lbs (per pt) Lowest reported weight: 225 lbs (2008) Weight today: 274.5 lbs Total weight lost: 75.0 lbs  TANITA  BODY COMP RESULTS  01/04/12   BMI (kg/m^2) 44.3   Fat Mass (lbs) 145.0   Fat Free Mass (lbs) 129.5   Total Body Water (lbs) 95.0   24-hr recall: B (AM): Reg coffee (10 oz); 3 pcs reg bacon & 1 egg on wheat bread (2 pcs) OR Yoplait greek yogurt (5.3 oz cup) -- 12-15g Snk (AM): Coffee (16 oz) on way to work and almonds, luna bar, or yogurt (1-2 days/week) - 10-15g L (PM): Salad w/ tomato, 3 oz rotissere chicken (no skin) & balsamic or [place] dressing (dips or light drizzle on top) - sometimes uses ranch - 25g Snk (PM): NONE  D (PM): Spaghetti (by [employer] - made with veggies - 40g CHO) with ground beef in tomato sauce - 10g Snk (PM): NONE or dark chocolate hot chocolate w/ marshmallows ("craves" during monthly cycle) Beverages: Coffee, water, Monster Pink Lemonade energy drink  Fluid intake: 5-6 (16.9 oz bottles) water = 80-95 oz Estimated total protein intake: 60-65 g  Medications:  See medication list Supplementation: None at this time  Using straws: No Drinking while eating: Yes Hair loss: No Carbonated beverages: No N/V/D/C: No Dumping syndrome: None recently - only 4-5 episodes during first year post-op  Recent physical activity:  None at this time. Was working out 1 hr/day @ 2x/wk until  06/2011. Stopped d/t school ending and exercise program no longer available. Currently wearing foot brace (Lt foot) d/t bone spurs, but usually dances (Salsa) 1-2 times/week for 2 hours/day.  Progress Towards Goal(s):  In progress.  Handouts given during visit include:  Bariatric Surgery Modified Post-Op diet  Vitamin & Minerals handout   Nutritional Diagnosis:  NI-5.8.2 Excessive carbohydrate intake As related to gastric bypass surgery.  As evidenced by patient-reported food recall and weight gain of ~50 lbs in last 4 years.    Intervention:  Nutrition education.  Monitoring/Evaluation:  Dietary intake, exercise, and body weight. Follow up in 6 weeks for post-op visit.

## 2012-01-04 NOTE — Patient Instructions (Addendum)
Goals:  Follow Bariatric Surgery Modified Post-Op Diet  Keep carbs to 15 g per meal - use www.calorieking.com for food label info  Track intake for 2 weekdays & 1 Saturday - email to me for analysis  Increase lean protein foods to meet 60-80g goal  Avoid drinking 15 minutes before, nothing during and for 20-30 minutes after eating  Aim for >30 min of physical activity daily  Resume vitamin and mineral supplementation (see handout)  Decrease coffee intake - try 1/2 and 1/2  Contact Dr. Ermalene Searing office re: having nutrition labs drawn (specifically ferritin, vit D)

## 2012-01-10 ENCOUNTER — Encounter (INDEPENDENT_AMBULATORY_CARE_PROVIDER_SITE_OTHER): Payer: BC Managed Care – PPO | Admitting: Surgery

## 2012-02-10 ENCOUNTER — Ambulatory Visit: Payer: BC Managed Care – PPO | Admitting: *Deleted

## 2012-02-23 ENCOUNTER — Ambulatory Visit (INDEPENDENT_AMBULATORY_CARE_PROVIDER_SITE_OTHER): Payer: BC Managed Care – PPO | Admitting: Surgery

## 2012-02-23 ENCOUNTER — Encounter (INDEPENDENT_AMBULATORY_CARE_PROVIDER_SITE_OTHER): Payer: Self-pay | Admitting: Surgery

## 2012-02-23 VITALS — BP 122/83 | HR 68 | Temp 98.4°F | Resp 16 | Ht 66.0 in | Wt 276.0 lb

## 2012-02-23 DIAGNOSIS — Z9884 Bariatric surgery status: Secondary | ICD-10-CM

## 2012-02-23 NOTE — Patient Instructions (Signed)
Stay on the low carb diet and try to engage in the water aerobics and exercise programs.

## 2012-02-23 NOTE — Progress Notes (Signed)
Roberta [name] 47 y.o.  Body mass index is 44.57 kg/(m^2).  Patient Active Problem List  Diagnosis  . 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 Hypertension  . Dense breasts    Allergies  Allergen Reactions  . Lisinopril Swelling    Swelling of the tongue  . Penicillins     Pt told as a child-unknown reaction.  . Tetanus Toxoids Swelling    Swelling at site of injection  . Norvasc (Amlodipine Besylate) Rash    Past Surgical History  Procedure Laterality Date  . Cholecystectomy    . Gastric bypass    . Knee surgery  2002    Ligament repair   [name], MD No diagnosis found.  Has had heel spurs since last visit and this has sidelined her.  Visit with Huntley Dec was good.  Her thyroid functions were normal and the ultrasound was unremarkable.  She is headed to [place] over spring break.  Will see her back in 3 months. Matt B. Daphine Deutscher, MD, [facility] [phone] beeper [phone]  02/23/2012 5:14 PM

## 2012-02-26 ENCOUNTER — Encounter: Payer: Self-pay | Admitting: *Deleted

## 2012-02-26 ENCOUNTER — Encounter: Payer: BC Managed Care – PPO | Attending: Surgery | Admitting: *Deleted

## 2012-02-26 DIAGNOSIS — Z09 Encounter for follow-up examination after completed treatment for conditions other than malignant neoplasm: Secondary | ICD-10-CM | POA: Insufficient documentation

## 2012-02-26 DIAGNOSIS — Z9884 Bariatric surgery status: Secondary | ICD-10-CM | POA: Insufficient documentation

## 2012-02-26 DIAGNOSIS — Z713 Dietary counseling and surveillance: Secondary | ICD-10-CM | POA: Insufficient documentation

## 2012-02-26 NOTE — Progress Notes (Addendum)
  Follow-up visit:  8 Years Post-Operative RYGB Surgery  Medical Nutrition Therapy:  Appt start time:  1400  End time:  1445.  Primary concerns today: Post-operative RYGB Nutrition Refresher. Roberta [name] comes today for f/u and reports increased fatigue.  Dietary recall reveals increased CHO intake from previous visit, though reports she is now reading food labels. 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  Surgery date: 06/2003 Start weight: 349.5 lbs (per pt) Lowest reported weight: 225 lbs (2008)  Weight today: 278.0 lbs Weight change: 3.5 lb GAIN (lost 5 lbs FM) Total weight lost: 70.0 lbs  TANITA  BODY COMP RESULTS  01/04/12 02/26/12   BMI (kg/m^2) 44.3 44.9   Fat Mass (lbs) 145.0 140.0   Fat Free Mass (lbs) 129.5 138.0   Total Body Water (lbs) 95.0 101.0   24-hr recall: B (AM): Reg coffee (20 oz); waited 30 min - then Special K muffin w/ sausage, egg, and cheese (240 cal; 9-11 g pro) Snk (AM):  Plantars Mixed nuts (1.5 oz) L (PM): Leftover dinner - 3 oz sirloin w/ 1/4 cup lobster mac & cheese Snk (PM): NONE  D (PM): Salad with 3 oz grilled chicken in teriyaki sauce Snk (PM): 1 pc banana foster cheesecake (at [employer]) - had out of town guests; not normal snack Beverages: Coffee, water, Monster Pink Lemonade energy drink (only 1 per week now)  Fluid intake: 5-6 (16.9 oz bottles) water = 80-95 oz Estimated total protein intake: 60-65 g  Medications:  See medication list  Supplementation:  Taking 1 MVI and B12; no calcium   Using straws: No Drinking while eating: Yes - though has decreased by 1/2 Hair loss: No Carbonated beverages: No N/V/D/C: No Dumping syndrome: None recently - only 4-5 episodes during first year post-op  Recent physical activity:  None at this time d/t bone spurs. Reports Dr. Daphine Deutscher recommends water aerobics, which I agree with. Also states she  is having trouble finding the time to exercise.   Progress Towards Goal(s):  In progress.   Nutritional Diagnosis:  NI-5.8.2 Excessive carbohydrate intake As related to gastric bypass surgery.  As evidenced by patient-reported food recall and weight gain of ~50 lbs in last 4 years.    Intervention:  Nutrition education.  Monitoring/Evaluation:  Dietary intake, exercise, and body weight. Follow up in 6 weeks for post-op visit.

## 2012-02-26 NOTE — Patient Instructions (Addendum)
Goals:  Follow Bariatric Surgery Modified Post-Op Diet  Keep carbs to 15-20 g per meal - use www.calorieking.com for food label info  Track intake for 2 weekdays & 1 Saturday - email to me for analysis  Increase lean protein foods to meet 60-80g goal  Avoid drinking 15 minutes before, nothing during and for 20-30 minutes after eating  Aim for >30 min of physical activity daily  Resume vitamin and mineral supplementation (see handout)   Take 2 doses of your multivitamin, (3) 500 mg doses of calcium citrate, and 1 dose of (350-500 mcg) sublingual B12  Sample schedule:  B: MVI + B12  Snk: MVI L: calcium D: calcium Bed: calcium  Decrease coffee intake - try 1/2 and 1/2  Contact PCP or Dr. Ermalene Searing office re: having nutrition labs drawn (specifically ferritin, vit D)

## 2012-04-19 ENCOUNTER — Ambulatory Visit: Payer: BC Managed Care – PPO | Admitting: *Deleted

## 2012-05-22 ENCOUNTER — Encounter (INDEPENDENT_AMBULATORY_CARE_PROVIDER_SITE_OTHER): Payer: BC Managed Care – PPO | Admitting: Surgery

## 2012-05-22 ENCOUNTER — Ambulatory Visit (INDEPENDENT_AMBULATORY_CARE_PROVIDER_SITE_OTHER): Payer: BC Managed Care – PPO | Admitting: Surgery

## 2012-06-05 ENCOUNTER — Encounter (INDEPENDENT_AMBULATORY_CARE_PROVIDER_SITE_OTHER): Payer: Self-pay | Admitting: Surgery

## 2013-03-04 ENCOUNTER — Other Ambulatory Visit: Payer: Self-pay

## 2013-03-04 DIAGNOSIS — Z1231 Encounter for screening mammogram for malignant neoplasm of breast: Secondary | ICD-10-CM

## 2013-03-17 ENCOUNTER — Ambulatory Visit
Admission: RE | Admit: 2013-03-17 | Discharge: 2013-03-17 | Disposition: A | Discharge status: Home / Self Care | Payer: BC Managed Care – PPO | Source: Ambulatory Visit

## 2013-03-17 DIAGNOSIS — Z1231 Encounter for screening mammogram for malignant neoplasm of breast: Secondary | ICD-10-CM

## 2013-03-27 LAB — HM PAP SMEAR: HM Pap smear: NEGATIVE

## 2014-05-14 ENCOUNTER — Other Ambulatory Visit: Payer: Self-pay

## 2014-05-14 DIAGNOSIS — Z1231 Encounter for screening mammogram for malignant neoplasm of breast: Secondary | ICD-10-CM

## 2014-05-18 ENCOUNTER — Ambulatory Visit: Payer: BC Managed Care – PPO

## 2014-05-29 ENCOUNTER — Ambulatory Visit
Admission: RE | Admit: 2014-05-29 | Discharge: 2014-05-29 | Disposition: A | Discharge status: Home / Self Care | Payer: BC Managed Care – PPO | Source: Ambulatory Visit

## 2014-05-29 DIAGNOSIS — Z1231 Encounter for screening mammogram for malignant neoplasm of breast: Secondary | ICD-10-CM

## 2014-08-08 ENCOUNTER — Ambulatory Visit (INDEPENDENT_AMBULATORY_CARE_PROVIDER_SITE_OTHER): Payer: BC Managed Care – PPO | Admitting: Emergency Medicine

## 2014-08-08 VITALS — BP 126/80 | HR 68 | Temp 98.3°F | Ht 69.5 in | Wt 269.4 lb

## 2014-08-08 DIAGNOSIS — W57XXXA Bitten or stung by nonvenomous insect and other nonvenomous arthropods, initial encounter: Secondary | ICD-10-CM

## 2014-08-08 DIAGNOSIS — T148 Other injury of unspecified body region: Secondary | ICD-10-CM

## 2014-08-08 MED ORDER — BETAMETHASONE DIPROPIONATE 0.05 % EX CREA: TOPICAL_CREAM | Freq: Two times a day (BID) | CUTANEOUS | Status: DC

## 2014-08-08 NOTE — Patient Instructions (Signed)

## 2014-08-08 NOTE — Progress Notes (Signed)
Subjective:  Patient ID: Roberta [name], female    DOB: Oct 24, 1965  Age: 49 y.o. MRN: [id number]  CC: Insect Bite   HPI Roberta [name] presents  with multiple insect bites. She said over the last several days she been staying in her mother's house and has received a number of insect bites on her extremities and trunk. She denies any symptoms suggestive of a generalized allergic reaction wheezing or shortness of breath and difficulty with speaking or swallowing. She has isolated individual discrete pruritic insect bites.  History Roberta [name] has a past medical history of Hypertension and Obesity.   She has past surgical history that includes Cholecystectomy; Gastric bypass; and Knee surgery (2002).   Her  family history includes Cancer in her maternal aunt and maternal uncle.  She   reports that she has never smoked. She has never used smokeless tobacco. She reports that she does not drink alcohol or use illicit drugs.  Outpatient Prescriptions Prior to Visit  Medication Sig Dispense Refill  . Olmesartan-Amlodipine-HCTZ (TRIBENZOR) 20-5-12.5 MG TABS Take by mouth.     No facility-administered medications prior to visit.    History   Social History  . Marital Status: Legally Separated    Spouse Name: N/A  . Number of Children: N/A  . Years of Education: N/A   Social History Main Topics  . Smoking status: Never Smoker   . Smokeless tobacco: Never Used  . Alcohol Use: No  . Drug Use: No  . Sexual Activity: Not on file   Other Topics Concern  . None   Social History Narrative     Review of Systems  Constitutional: Negative for fever, chills and appetite change.  HENT: Negative for congestion, ear pain, postnasal drip, sinus pressure and sore throat.   Eyes: Negative for pain and redness.  Respiratory: Negative for cough, shortness of breath and wheezing.   Cardiovascular: Negative for leg swelling.  Gastrointestinal: Negative for nausea, vomiting, abdominal pain,  diarrhea, constipation and blood in stool.  Endocrine: Negative for polyuria.  Genitourinary: Negative for dysuria, urgency, frequency and flank pain.  Musculoskeletal: Negative for gait problem.  Skin: Negative for rash.  Neurological: Negative for weakness and headaches.  Psychiatric/Behavioral: Negative for confusion and decreased concentration. The patient is not nervous/anxious.     Objective:  BP 126/80 mmHg  Pulse 68  Temp(Src) 98.3 F (36.8 C) (Oral)  Ht 5' 9.5" (1.765 m)  Wt 269 lb 6.4 oz (122.199 kg)  BMI 39.23 kg/m2  SpO2 98%  LMP 08/07/2014  Physical Exam  Constitutional: She is oriented to person, place, and time. She appears well-developed and well-nourished.  HENT:  Head: Normocephalic and atraumatic.  Eyes: Conjunctivae are normal. Pupils are equal, round, and reactive to light.  Pulmonary/Chest: Effort normal.  Musculoskeletal: She exhibits no edema.  Neurological: She is alert and oriented to person, place, and time.  Skin: Skin is warm and dry. Rash noted.  Psychiatric: She has a normal mood and affect. Her behavior is normal. Thought content normal.      Assessment & Plan:   Roberta [name] was seen today for insect bite.  Diagnoses and all orders for this visit:  Insect bite  Other orders -     Discontinue: betamethasone dipropionate (DIPROLENE) 0.05 % cream; Apply topically 2 (two) times daily. -     betamethasone dipropionate (DIPROLENE) 0.05 % cream; Apply topically 2 (two) times daily.   I am having Roberta [name] maintain her Olmesartan-Amlodipine-HCTZ, Furosemide (LASIX PO), LOSARTAN POTASSIUM  PO, multivitamin, cholecalciferol, cyanocobalamin, ferrous sulfate, and betamethasone dipropionate.  Meds ordered this encounter  Medications  . Furosemide (LASIX PO)    Sig: Take 1 tablet by mouth every other day.  Marland Kitchen LOSARTAN POTASSIUM PO    Sig: Take by mouth.  . Multiple Vitamin (MULTIVITAMIN) tablet    Sig: Take 1 tablet by mouth daily.  .  cholecalciferol (VITAMIN D) 1000 UNITS tablet    Sig: Take 1,000 Units by mouth daily.  . cyanocobalamin 100 MCG tablet    Sig: Take 100 mcg by mouth daily.  . ferrous sulfate 325 (65 FE) MG tablet    Sig: Take 325 mg by mouth daily with breakfast.  . DISCONTD: betamethasone dipropionate (DIPROLENE) 0.05 % cream    Sig: Apply topically 2 (two) times daily.    Dispense:  30 g    Refill:  0  . betamethasone dipropionate (DIPROLENE) 0.05 % cream    Sig: Apply topically 2 (two) times daily.    Dispense:  30 g    Refill:  0    Appropriate red flag conditions were discussed with the patient as well as actions that should be taken.  Patient expressed his understanding.  Follow-up: Return if symptoms worsen or fail to improve.  Roselee Culver, MD

## 2015-06-15 ENCOUNTER — Other Ambulatory Visit: Payer: Self-pay | Admitting: Obstetrics and Gynecology

## 2015-06-15 DIAGNOSIS — Z1231 Encounter for screening mammogram for malignant neoplasm of breast: Secondary | ICD-10-CM

## 2015-06-18 ENCOUNTER — Other Ambulatory Visit: Payer: Self-pay | Admitting: Internal Medicine

## 2015-06-18 DIAGNOSIS — I739 Peripheral vascular disease, unspecified: Secondary | ICD-10-CM

## 2015-06-18 DIAGNOSIS — E049 Nontoxic goiter, unspecified: Secondary | ICD-10-CM

## 2015-06-23 ENCOUNTER — Ambulatory Visit
Admission: RE | Admit: 2015-06-23 | Discharge: 2015-06-23 | Disposition: A | Discharge status: Home / Self Care | Payer: BC Managed Care – PPO | Source: Ambulatory Visit | Attending: Obstetrics and Gynecology | Admitting: Obstetrics and Gynecology

## 2015-06-23 DIAGNOSIS — Z1231 Encounter for screening mammogram for malignant neoplasm of breast: Secondary | ICD-10-CM

## 2015-07-02 ENCOUNTER — Ambulatory Visit
Admission: RE | Admit: 2015-07-02 | Discharge: 2015-07-02 | Disposition: A | Discharge status: Home / Self Care | Payer: BC Managed Care – PPO | Source: Ambulatory Visit | Attending: Internal Medicine | Admitting: Internal Medicine

## 2015-07-02 DIAGNOSIS — E049 Nontoxic goiter, unspecified: Secondary | ICD-10-CM

## 2015-07-02 DIAGNOSIS — I739 Peripheral vascular disease, unspecified: Secondary | ICD-10-CM

## 2015-10-18 ENCOUNTER — Other Ambulatory Visit: Payer: Self-pay | Admitting: Emergency Medicine

## 2015-10-18 MED ORDER — LOSARTAN POTASSIUM-HCTZ 100-25 MG PO TABS: 1.0000 | ORAL_TABLET | Freq: Every day | ORAL | 0 refills | Status: DC

## 2015-10-18 NOTE — Telephone Encounter (Signed)
Yes, ok 

## 2015-10-18 NOTE — Telephone Encounter (Signed)
Pt has an establish care visit on 12/13/15, are you okay with filling this until then?

## 2015-12-12 NOTE — Progress Notes (Signed)
Subjective:    Patient ID: Roberta [name], female    DOB: [birth year], 50 y.o.   MRN: [id number]  HPI She is here to establish with a new pcp.    She is working two jobs, but is stopping one of her jobs this week.  She is hoping to have more time for herself and work on her health.  She is currently not exercising.   Headache:  She has had an intermittent headache since Friday, three days ago.  She usually takes ibuprofen and it works, but it is not working with this headache.  She has had headaches with her cycle.  She has not had menses since October.  She has had some cramping since then, but no periods.  She has taken advil, midol and has drank extra coffee.  She usually takes 400 mg of advil.  The headache is in her frontal region now, but has been in the posterior head.  She denies sensitivity to light/sound and nausea.   Lump in throat:  She has had a lump in her throat.  She was able to feel it more when she swallowed.,  She feels it less now.  She had a thyroid US, which was normal.  She denies gerd or PND.  She does not feel it now.     Left leg swelling:  Over the summer her left leg was swollen and her right leg was not.  She had an [place] of her leg this summer and it was normal.  She takes lasix every other day.  She has not had swelling since the summer.  She has not tried stopping the lasix.   Obesity: She had a gastric bypass in 2005.  She gained weight after that and was on saxenda last year for about one year.  She also had an elevated a1c.  She does not think it heped that much.  She wants to start exercising regularly and work on weight loss naturally.   Hypertension: She is taking her medication daily. She is compliant with a low sodium diet.  She denies chest pain, palpitations, edema, shortness of breath and regular headaches. She is not exercising regularly.    She does not monitor her BP at home.    Medications and allergies reviewed with patient and updated if  appropriate.  Patient Active Problem List   Diagnosis Date Noted  . Dense breasts 12/13/2011  . 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 Hypertension 11/10/2011    Current Outpatient Prescriptions on File Prior to Visit  Medication Sig Dispense Refill  . losartan-hydrochlorothiazide (HYZAAR) 100-25 MG tablet Take 1 tablet by mouth daily. 90 tablet 0  . Multiple Vitamin (MULTIVITAMIN) tablet Take 1 tablet by mouth daily.    . ferrous sulfate 325 (65 FE) MG tablet Take 325 mg by mouth daily with breakfast.     No current facility-administered medications on file prior to visit.     Past Medical History:  Diagnosis Date  . Hypertension   . Obesity     Past Surgical History:  Procedure Laterality Date  . CHOLECYSTECTOMY    . GASTRIC BYPASS    . KNEE SURGERY  2002   Ligament repair    Social History   Social History  . Marital status: Legally Separated    Spouse name: N/A  . Number of children: N/A  . Years of education: N/A   Social History Main Topics  . Smoking status: Never Smoker  .  Smokeless tobacco: Never Used  . Alcohol use No  . Drug use: No  . Sexual activity: Not Asked   Other Topics Concern  . None   Social History Narrative  . None    Family History  Problem Relation Age of Onset  . Cancer Maternal Aunt     lung  . Cancer Maternal Uncle     lung    Review of Systems  Constitutional: Negative for chills and fever.  HENT: Negative for ear pain, sinus pain, sore throat and trouble swallowing.   Eyes: Negative for visual disturbance.  Respiratory: Negative for cough, shortness of breath and wheezing.   Cardiovascular: Positive for leg swelling. Negative for chest pain and palpitations.  Gastrointestinal: Negative for blood in stool, constipation, diarrhea and nausea.       No gerd  Endocrine: Negative for polyuria.  Genitourinary: Negative for dysuria and hematuria.  Musculoskeletal: Negative for arthralgias and back pain.    Neurological: Positive for headaches. Negative for dizziness and light-headedness.  Psychiatric/Behavioral: Negative for dysphoric mood. The patient is not nervous/anxious.        Objective:   Vitals:   12/13/15 1447  BP: 128/84  Pulse: 89  Resp: 16  Temp: 98.1 F (36.7 C)   Filed Weights   12/13/15 1447  Weight: 258 lb (117 kg)   Body mass index is 41.64 kg/m.   Physical Exam Constitutional: She appears well-developed and well-nourished. No distress.  HENT:  Head: Normocephalic and atraumatic.  Right Ear: External ear normal. Normal ear canal and TM Left Ear: External ear normal.  Normal ear canal and TM Mouth/Throat: Oropharynx is clear and moist.  Eyes: Conjunctivae and EOM are normal.  Neck: Neck supple. No tracheal deviation present. No thyromegaly present.  No carotid bruit  Cardiovascular: Normal rate, regular rhythm and normal heart sounds.   No murmur heard.  No edema. Pulmonary/Chest: Effort normal and breath sounds normal. No respiratory distress. She has no wheezes. She has no rales.  Abdominal: Soft. She exhibits no distension. There is no tenderness.  Lymphadenopathy: She has no cervical adenopathy.  Skin: Skin is warm and dry. She is not diaphoretic.  Psychiatric: She has a normal mood and affect. Her behavior is normal.         Assessment & Plan:   See Problem List for Assessment and Plan of chronic medical problems.  F/u in 6 months

## 2015-12-13 ENCOUNTER — Other Ambulatory Visit (INDEPENDENT_AMBULATORY_CARE_PROVIDER_SITE_OTHER): Payer: BC Managed Care – PPO

## 2015-12-13 ENCOUNTER — Ambulatory Visit (INDEPENDENT_AMBULATORY_CARE_PROVIDER_SITE_OTHER): Payer: BC Managed Care – PPO | Admitting: Internal Medicine

## 2015-12-13 ENCOUNTER — Encounter: Payer: Self-pay | Admitting: Internal Medicine

## 2015-12-13 VITALS — BP 128/84 | HR 89 | Temp 98.1°F | Resp 16 | Ht 66.0 in | Wt 258.0 lb

## 2015-12-13 DIAGNOSIS — Z369 Encounter for antenatal screening, unspecified: Secondary | ICD-10-CM

## 2015-12-13 DIAGNOSIS — R0989 Other specified symptoms and signs involving the circulatory and respiratory systems: Secondary | ICD-10-CM | POA: Insufficient documentation

## 2015-12-13 DIAGNOSIS — I10 Essential (primary) hypertension: Secondary | ICD-10-CM

## 2015-12-13 DIAGNOSIS — R7303 Prediabetes: Secondary | ICD-10-CM | POA: Diagnosis not present

## 2015-12-13 DIAGNOSIS — R198 Other specified symptoms and signs involving the digestive system and abdomen: Secondary | ICD-10-CM | POA: Insufficient documentation

## 2015-12-13 DIAGNOSIS — K909 Intestinal malabsorption, unspecified: Secondary | ICD-10-CM | POA: Diagnosis not present

## 2015-12-13 DIAGNOSIS — Z6841 Body Mass Index (BMI) 40.0 and over, adult: Secondary | ICD-10-CM

## 2015-12-13 DIAGNOSIS — E669 Obesity, unspecified: Secondary | ICD-10-CM

## 2015-12-13 DIAGNOSIS — R221 Localized swelling, mass and lump, neck: Secondary | ICD-10-CM

## 2015-12-13 DIAGNOSIS — IMO0001 Reserved for inherently not codable concepts without codable children: Secondary | ICD-10-CM

## 2015-12-13 DIAGNOSIS — R600 Localized edema: Secondary | ICD-10-CM | POA: Insufficient documentation

## 2015-12-13 DIAGNOSIS — E6609 Other obesity due to excess calories: Secondary | ICD-10-CM

## 2015-12-13 LAB — COMPREHENSIVE METABOLIC PANEL
ALT: 11 U/L (ref 0–35)
AST: 16 U/L (ref 0–37)
Albumin: 3.7 g/dL (ref 3.5–5.2)
Alkaline Phosphatase: 70 U/L (ref 39–117)
BUN: 21 mg/dL (ref 6–23)
CO2: 29 mEq/L (ref 19–32)
Calcium: 9.3 mg/dL (ref 8.4–10.5)
Chloride: 102 mEq/L (ref 96–112)
Creatinine, Ser: 1 mg/dL (ref 0.40–1.20)
GFR: 61.71 mL/min (ref >60.00)
Glucose, Bld: 82 mg/dL (ref 70–99)
Potassium: 3.3 mEq/L — ABNORMAL LOW (ref 3.5–5.1)
Sodium: 138 mEq/L (ref 135–145)
Total Bilirubin: 0.3 mg/dL (ref 0.2–1.2)
Total Protein: 8 g/dL (ref 6.0–8.3)

## 2015-12-13 LAB — CBC WITH DIFFERENTIAL/PLATELET
Basophils Absolute: 0.1 10*3/uL (ref 0.0–0.1)
Basophils Relative: 1.6 % (ref 0.0–3.0)
Eosinophils Absolute: 0.1 10*3/uL (ref 0.0–0.7)
Eosinophils Relative: 1.7 % (ref 0.0–5.0)
HCT: 35.5 % — ABNORMAL LOW (ref 36.0–46.0)
Hemoglobin: 11.6 g/dL — ABNORMAL LOW (ref 12.0–15.0)
Lymphocytes Relative: 23.9 % (ref 12.0–46.0)
Lymphs Abs: 1.5 10*3/uL (ref 0.7–4.0)
MCHC: 32.7 g/dL (ref 30.0–36.0)
MCV: 76.7 fl — ABNORMAL LOW (ref 78.0–100.0)
Monocytes Absolute: 0.5 10*3/uL (ref 0.1–1.0)
Monocytes Relative: 8 % (ref 3.0–12.0)
Neutro Abs: 4.2 10*3/uL (ref 1.4–7.7)
Neutrophils Relative %: 64.8 % (ref 43.0–77.0)
Platelets: 299 10*3/uL (ref 150.0–400.0)
RBC: 4.63 Mil/uL (ref 3.87–5.11)
RDW: 17.1 % — ABNORMAL HIGH (ref 11.5–15.5)
WBC: 6.4 10*3/uL (ref 4.0–10.5)

## 2015-12-13 LAB — VITAMIN B12: Vitamin B-12: 867 pg/mL (ref 211–911)

## 2015-12-13 LAB — HEMOGLOBIN A1C: Hgb A1c MFr Bld: 6.2 % (ref 4.6–6.5)

## 2015-12-13 LAB — FERRITIN: Ferritin: 8.7 ng/mL — ABNORMAL LOW (ref 10.0–291.0)

## 2015-12-13 LAB — IRON: Iron: 30 ug/dL — ABNORMAL LOW (ref 42–145)

## 2015-12-13 LAB — VITAMIN D 25 HYDROXY (VIT D DEFICIENCY, FRACTURES): VITD: 40.63 ng/mL (ref 30.00–100.00)

## 2015-12-13 MED ORDER — LOSARTAN POTASSIUM-HCTZ 100-25 MG PO TABS: 1.0000 | ORAL_TABLET | Freq: Every day | ORAL | 3 refills | Status: DC

## 2015-12-13 NOTE — Progress Notes (Signed)
Pre visit review using our clinic review tool, if applicable. No additional management support is needed unless otherwise documented below in the visit note. 

## 2015-12-13 NOTE — Assessment & Plan Note (Signed)
On lasix 20 mg every other day, also taking hctz 25 mg daily No edema now Check cmp ? Truly need for lasix - advised to try taking only as needed Low sodium diet Work on increasing exercise and weight loss

## 2015-12-13 NOTE — Assessment & Plan Note (Signed)
Check B12, D level, iron, ferritin, cbc, cmp

## 2015-12-13 NOTE — Assessment & Plan Note (Signed)
Check a1c Low sugar/carb diet Start regular exercise Work on weight loss

## 2015-12-13 NOTE — Assessment & Plan Note (Signed)
?   Atypical GERD Thyroid US done - not cause of symptoms Symptoms have improved - will monitor for now

## 2015-12-13 NOTE — Assessment & Plan Note (Signed)
BP well controlled  - higher here than usual per patient Current regimen effective and well tolerated Continue current medications at current doses cmp

## 2015-12-13 NOTE — Patient Instructions (Addendum)
  Test(s) ordered today. Your results will be released to [place] (or called to you) after review, usually within 72hours after test completion. If any changes need to be made, you will be notified at that same time.  All other Health Maintenance issues reviewed.   All recommended immunizations and age-appropriate screenings are up-to-date or discussed.  No immunizations administered today.   Medications reviewed and updated.  No changes recommended at this time. Try taking lasix only as needed.   Your prescription(s) have been submitted to your pharmacy. Please take as directed and contact our office if you believe you are having problem(s) with the medication(s).   Please followup in  6 months

## 2015-12-13 NOTE — Assessment & Plan Note (Signed)
S/p gastric bypass Was on saxenda for one year Will now work on regular exercise, decreased portions and healthy diet

## 2015-12-13 NOTE — Assessment & Plan Note (Signed)
Perimenopausal - no period for two months Taking OCP;s Very unlikely to be pregnant but will check serum pregnancy test

## 2015-12-14 LAB — HCG, SERUM, QUALITATIVE: Preg, Serum: NEGATIVE

## 2015-12-15 ENCOUNTER — Encounter: Payer: Self-pay | Admitting: Internal Medicine

## 2015-12-15 NOTE — Progress Notes (Unsigned)
Roberta [name],  Your pregnancy test is negative.  Your vitamin D and B12 levels are normal.    Your kidney function, liver tests are normal.  Your sugars are in the prediabetic range.   Your potassium is slightly low.  Your iron is on the low side and you have mild anemia.  You need to make sure you are taking the iron daily.  Your potassium is a little low - try to eat more high potassium foods - if this remains low you may need potassium pills.    Let me know if you have any questions or concerns.   Dr. Billey Gosling.

## 2015-12-19 ENCOUNTER — Encounter: Payer: Self-pay | Admitting: Internal Medicine

## 2015-12-21 NOTE — Telephone Encounter (Signed)
Call her and see if she can come in - she needs an appt to get an injection for her migraine

## 2015-12-22 NOTE — Telephone Encounter (Signed)
LVM for pt to call to schedule appt.

## 2015-12-29 NOTE — Telephone Encounter (Signed)
Pt call back stating she doesn't not need injection anymore, migraine went away.

## 2015-12-29 NOTE — Telephone Encounter (Signed)
Noted  

## 2016-04-17 ENCOUNTER — Ambulatory Visit (INDEPENDENT_AMBULATORY_CARE_PROVIDER_SITE_OTHER): Payer: BC Managed Care – PPO | Admitting: Internal Medicine

## 2016-04-17 ENCOUNTER — Encounter: Payer: Self-pay | Admitting: Internal Medicine

## 2016-04-17 VITALS — BP 110/80 | HR 68 | Temp 98.4°F | Resp 16 | Wt 277.0 lb

## 2016-04-17 DIAGNOSIS — R21 Rash and other nonspecific skin eruption: Secondary | ICD-10-CM | POA: Diagnosis not present

## 2016-04-17 DIAGNOSIS — W57XXXA Bitten or stung by nonvenomous insect and other nonvenomous arthropods, initial encounter: Secondary | ICD-10-CM | POA: Diagnosis not present

## 2016-04-17 MED ORDER — PREDNISONE 20 MG PO TABS: 40.0000 mg | ORAL_TABLET | Freq: Every day | ORAL | 0 refills | Status: DC

## 2016-04-17 NOTE — Patient Instructions (Signed)
Continue benadryl and allegra.  Apply topical steroid cream to affected areas.   Use the prednisone if needed.   Call if no improvement

## 2016-04-17 NOTE — Progress Notes (Signed)
Pre visit review using our clinic review tool, if applicable. No additional management support is needed unless otherwise documented below in the visit note. 

## 2016-04-17 NOTE — Assessment & Plan Note (Signed)
Presumed bug bites - but may be hives ? Allergic or reactive to bug bite Continue allegra and benadryl Will try betamethasone steroid cream she has at home on the lesions Will give a prescription for prednisone - if no improvement in 24 hrs she will take it Call with questions If episodes recur may need allergy testing - discussed with her this may be hives/allergic reaction

## 2016-04-17 NOTE — Progress Notes (Signed)
Subjective:    Patient ID: Roberta [name], female    DOB: 1965-05-01, 50 y.o.   MRN: [id number]  HPI She is here for an acute visit.   Easter weekend she went to someone's house and the next day she felt bitten.  She thought they were bug bites.  The bites lasted several days, but she took benadryl/allegra and they did go away.  They were very itchy.  She went back to that house this past weekend she started to feel it again Sunday morning.  She has a bug bites - one on each arm, one on back of thigh and one on back of neck.    She has been taking benadryl at night.  She has been taking allegra during day.  She is itching and that makes the area larger.  They have no pets.  She was not outside.  She does not recall eating anything different or the same thing each time.  No one else has gotten "bitten" as far as she knows.  Medications and allergies reviewed with patient and updated if appropriate.  Patient Active Problem List   Diagnosis Date Noted  . 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 Prediabetes 12/13/2015  . Visit for prenatal screening 12/13/2015  . Obesity 12/13/2015  . Sensation of lump in throat 12/13/2015  . Leg edema 12/13/2015  . Dense breasts 12/13/2011  . 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 Hypertension 11/10/2011    Current Outpatient Prescriptions on File Prior to Visit  Medication Sig Dispense Refill  . ferrous sulfate 325 (65 FE) MG tablet Take 325 mg by mouth daily with breakfast.    . furosemide (LASIX) 20 MG tablet Take 20 mg by mouth every other day.    . losartan-hydrochlorothiazide (HYZAAR) 100-25 MG tablet Take 1 tablet by mouth daily. 90 tablet 3  . Multiple Vitamin (MULTIVITAMIN) tablet Take 1 tablet by mouth daily.    . norethindrone (MICRONOR,CAMILA,ERRIN) 0.35 MG tablet Take 1 tablet by mouth daily.     No current facility-administered medications on file prior to visit.     Past Medical History:  Diagnosis Date  . Hypertension   .  Obesity     Past Surgical History:  Procedure Laterality Date  . CHOLECYSTECTOMY    . GASTRIC BYPASS    . KNEE SURGERY  2002   Ligament repair    Social History   Social History  . Marital status: Legally Separated    Spouse name: N/A  . Number of children: N/A  . Years of education: N/A   Social History Main Topics  . Smoking status: Never Smoker  . Smokeless tobacco: Never Used  . Alcohol use No  . Drug use: No  . Sexual activity: Not on file   Other Topics Concern  . Not on file   Social History Narrative  . No narrative on file    Family History  Problem Relation Age of Onset  . Cancer Maternal Aunt     lung  . Cancer Maternal Uncle     lung    Review of Systems  Constitutional: Negative for chills, fatigue and fever.  HENT: Negative for sore throat.   Respiratory: Negative for shortness of breath.   Musculoskeletal: Positive for arthralgias (not new - age related). Negative for myalgias.  Skin: Negative for rash.  Neurological: Negative for light-headedness and headaches.       Objective:   Vitals:   04/17/16 1038  BP: 110/80  Pulse: 68  Resp: 16  Temp: 98.4 F (36.9 C)   Filed Weights   04/17/16 1038  Weight: 277 lb (125.6 kg)   Body mass index is 44.71 kg/m.  Wt Readings from Last 3 Encounters:  04/17/16 277 lb (125.6 kg)  12/13/15 258 lb (117 kg)  08/08/14 269 lb 6.4 oz (122.2 kg)     Physical Exam  Constitutional: She appears well-developed and well-nourished. No distress.  HENT:  Head: Normocephalic and atraumatic.  Skin: Skin is warm and dry. She is not diaphoretic.  Two bites or hives on back neck, one on right forearm, two left arm and one on left thigh, largest one is size of baseball that looks like a large hive - red, raised          Assessment & Plan:   See Problem List for Assessment and Plan of chronic medical problems.

## 2016-06-06 ENCOUNTER — Other Ambulatory Visit: Payer: Self-pay | Admitting: Obstetrics and Gynecology

## 2016-06-06 DIAGNOSIS — Z1231 Encounter for screening mammogram for malignant neoplasm of breast: Secondary | ICD-10-CM

## 2016-06-19 ENCOUNTER — Ambulatory Visit (INDEPENDENT_AMBULATORY_CARE_PROVIDER_SITE_OTHER): Payer: BC Managed Care – PPO | Admitting: Internal Medicine

## 2016-06-19 ENCOUNTER — Encounter: Payer: Self-pay | Admitting: Internal Medicine

## 2016-06-19 VITALS — BP 130/84 | HR 64 | Temp 98.4°F | Resp 16 | Wt 281.0 lb

## 2016-06-19 DIAGNOSIS — M549 Dorsalgia, unspecified: Secondary | ICD-10-CM | POA: Diagnosis not present

## 2016-06-19 MED ORDER — CYCLOBENZAPRINE HCL 10 MG PO TABS: 10.0000 mg | ORAL_TABLET | Freq: Every day | ORAL | 0 refills | Status: DC

## 2016-06-19 NOTE — Assessment & Plan Note (Signed)
Possible strain - pain seems more musculoskeletal in nature, could possibly be early shingles - no rash now; less likely menstrual in nature She will monitor for a rash Tylenol ES for pain, avoid advil given gastric bypass in past Flexeril at night Will revise medication if no improvement She will let me know if there is no improvement

## 2016-06-19 NOTE — Progress Notes (Signed)
Subjective:    Patient ID: Roberta [name], female    DOB: 02-23-1965, 51 y.o.   MRN: [id number]  HPI She is here for an acute visit.   She went to bed two nights ago and felt fine.  Yesterday she woke up with back pain and is unsure why.  She denies unusual activities/injuries.  The back pain is in the middle back and wraps around to her front.  She also had pain in her right hip and knee.  She had difficulty walking initially.  Yesterday afternoon it seemed to settle in the left middle back to umbilicus.  Yesterday she took advil and later pamprim and later in the evening advil.  She used a heating pad.    She denies numbness/tingling in her legs.  She denies weakness in the legs.  Since yesterday she denies pain her her right leg.  Her pain s 5-6/10.  She has had similar pain in the past with her cycles.  She missed one month this year, but otherwise has been regular.      Medications and allergies reviewed with patient and updated if appropriate.  Patient Active Problem List   Diagnosis Date Noted  . Bug bite 04/17/2016  . 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 Prediabetes 12/13/2015  . Obesity 12/13/2015  . Sensation of lump in throat 12/13/2015  . Leg edema 12/13/2015  . Dense breasts 12/13/2011  . 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 Hypertension 11/10/2011    Current Outpatient Prescriptions on File Prior to Visit  Medication Sig Dispense Refill  . BLACK COHOSH EXTRACT PO Take by mouth daily.    . ferrous sulfate 325 (65 FE) MG tablet Take 325 mg by mouth daily with breakfast.    . furosemide (LASIX) 20 MG tablet Take 20 mg by mouth every other day.    . losartan-hydrochlorothiazide (HYZAAR) 100-25 MG tablet Take 1 tablet by mouth daily. 90 tablet 3  . Multiple Vitamin (MULTIVITAMIN) tablet Take 1 tablet by mouth daily.    . norethindrone (MICRONOR,CAMILA,ERRIN) 0.35 MG tablet Take 1 tablet by mouth daily.     No current facility-administered medications on file  prior to visit.     Past Medical History:  Diagnosis Date  . Hypertension   . Obesity     Past Surgical History:  Procedure Laterality Date  . CHOLECYSTECTOMY    . GASTRIC BYPASS    . KNEE SURGERY  2002   Ligament repair    Social History   Social History  . Marital status: Legally Separated    Spouse name: N/A  . Number of children: N/A  . Years of education: N/A   Social History Main Topics  . Smoking status: Never Smoker  . Smokeless tobacco: Never Used  . Alcohol use No  . Drug use: No  . Sexual activity: Not on file   Other Topics Concern  . Not on file   Social History Narrative  . No narrative on file    Family History  Problem Relation Age of Onset  . Cancer Maternal Aunt        lung  . Cancer Maternal Uncle        lung    Review of Systems  Constitutional: Negative for chills and fever.  Gastrointestinal: Negative for blood in stool, constipation, diarrhea and nausea.       No fecal incontinence; no gerd  Genitourinary: Negative for difficulty urinating, dysuria, frequency and hematuria.  No urinary incontinence  Musculoskeletal: Positive for back pain.  Neurological: Negative for weakness and numbness.       Objective:   Vitals:   06/19/16 1132  BP: 130/84  Pulse: 64  Resp: 16  Temp: 98.4 F (36.9 C)   Filed Weights   06/19/16 1132  Weight: 281 lb (127.5 kg)   Body mass index is 45.35 kg/m.  Wt Readings from Last 3 Encounters:  06/19/16 281 lb (127.5 kg)  04/17/16 277 lb (125.6 kg)  12/13/15 258 lb (117 kg)     Physical Exam  Constitutional: She appears well-developed and well-nourished. No distress.  Abdominal: Soft. She exhibits no distension. There is no tenderness. There is no rebound and no guarding.  Musculoskeletal: She exhibits no edema.  Tenderness in lower thoracic and lumbar spine, tenderness in left lower back  Neurological:  Normal sensation and strength b/l LE; positive left straight leg raise  Skin:  Skin is warm and dry. No rash noted. She is not diaphoretic.          Assessment & Plan:   See Problem List for Assessment and Plan of chronic medical problems.

## 2016-06-19 NOTE — Patient Instructions (Addendum)
Take 1/2 - 1 flexeril at night for your back pain.  Take advil infrequently for your pain.  Try ice/ heat  Try extra strength tylenol for your pain.   Monitor your skin for any rash/lesions.     Avoid bending, twisting and lifting.   Call with any questions or concerns.      Back Pain, Adult Back pain is very common in adults.The cause of back pain is rarely dangerous and the pain often gets better over time.The cause of your back pain may not be known. Some common causes of back pain include:  Strain of the muscles or ligaments supporting the spine.  Wear and tear (degeneration) of the spinal disks.  Arthritis.  Direct injury to the back.  For many people, back pain may return. Since back pain is rarely dangerous, most people can learn to manage this condition on their own. Follow these instructions at home: Watch your back pain for any changes. The following actions may help to lessen any discomfort you are feeling:  Remain active. It is stressful on your back to sit or stand in one place for long periods of time. Do not sit, drive, or stand in one place for more than 30 minutes at a time. Take short walks on even surfaces as soon as you are able.Try to increase the length of time you walk each day.  Exercise regularly as directed by your health care provider. Exercise helps your back heal faster. It also helps avoid future injury by keeping your muscles strong and flexible.  Do not stay in bed.Resting more than 1-2 days can delay your recovery.  Pay attention to your body when you bend and lift. The most comfortable positions are those that put less stress on your recovering back. Always use proper lifting techniques, including: ? Bending your knees. ? Keeping the load close to your body. ? Avoiding twisting.  Find a comfortable position to sleep. Use a firm mattress and lie on your side with your knees slightly bent. If you lie on your back, put a pillow under your  knees.  Avoid feeling anxious or stressed.Stress increases muscle tension and can worsen back pain.It is important to recognize when you are anxious or stressed and learn ways to manage it, such as with exercise.  Take medicines only as directed by your health care provider. Over-the-counter medicines to reduce pain and inflammation are often the most helpful.Your health care provider may prescribe muscle relaxant drugs.These medicines help dull your pain so you can more quickly return to your normal activities and healthy exercise.  Apply ice to the injured area: ? Put ice in a plastic bag. ? Place a towel between your skin and the bag. ? Leave the ice on for 20 minutes, 2-3 times a day for the first 2-3 days. After that, ice and heat may be alternated to reduce pain and spasms.  Maintain a healthy weight. Excess weight puts extra stress on your back and makes it difficult to maintain good posture.  Contact a health care provider if:  You have pain that is not relieved with rest or medicine.  You have increasing pain going down into the legs or buttocks.  You have pain that does not improve in one week.  You have night pain.  You lose weight.  You have a fever or chills. Get help right away if:  You develop new bowel or bladder control problems.  You have unusual weakness or numbness in your arms  or legs.  You develop nausea or vomiting.  You develop abdominal pain.  You feel faint. This information is not intended to replace advice given to you by your health care provider. Make sure you discuss any questions you have with your health care provider. Document Released: 12/19/2004 Document Revised: 04/29/2015 Document Reviewed: 04/22/2013 Elsevier Interactive Patient Education  2017 [employer].

## 2016-06-28 ENCOUNTER — Ambulatory Visit
Admission: RE | Admit: 2016-06-28 | Discharge: 2016-06-28 | Disposition: A | Discharge status: Home / Self Care | Payer: BC Managed Care – PPO | Source: Ambulatory Visit | Attending: Obstetrics and Gynecology | Admitting: Obstetrics and Gynecology

## 2016-06-28 DIAGNOSIS — Z1231 Encounter for screening mammogram for malignant neoplasm of breast: Secondary | ICD-10-CM

## 2016-06-28 LAB — HM MAMMOGRAPHY

## 2016-07-11 ENCOUNTER — Ambulatory Visit (INDEPENDENT_AMBULATORY_CARE_PROVIDER_SITE_OTHER): Payer: BC Managed Care – PPO | Admitting: Internal Medicine

## 2016-07-11 ENCOUNTER — Encounter: Payer: Self-pay | Admitting: Internal Medicine

## 2016-07-11 ENCOUNTER — Other Ambulatory Visit (INDEPENDENT_AMBULATORY_CARE_PROVIDER_SITE_OTHER): Payer: BC Managed Care – PPO

## 2016-07-11 VITALS — BP 132/88 | HR 99 | Temp 98.1°F | Resp 16 | Wt 287.0 lb

## 2016-07-11 DIAGNOSIS — Z6841 Body Mass Index (BMI) 40.0 and over, adult: Secondary | ICD-10-CM | POA: Diagnosis not present

## 2016-07-11 DIAGNOSIS — K9089 Other intestinal malabsorption: Secondary | ICD-10-CM

## 2016-07-11 DIAGNOSIS — Z1159 Encounter for screening for other viral diseases: Secondary | ICD-10-CM

## 2016-07-11 DIAGNOSIS — R7303 Prediabetes: Secondary | ICD-10-CM | POA: Diagnosis not present

## 2016-07-11 DIAGNOSIS — I10 Essential (primary) hypertension: Secondary | ICD-10-CM

## 2016-07-11 LAB — CBC WITH DIFFERENTIAL/PLATELET
Basophils Absolute: 0.1 10*3/uL (ref 0.0–0.1)
Basophils Relative: 1.2 % (ref 0.0–3.0)
Eosinophils Absolute: 0.1 10*3/uL (ref 0.0–0.7)
Eosinophils Relative: 1.9 % (ref 0.0–5.0)
HCT: 33.3 % — ABNORMAL LOW (ref 36.0–46.0)
Hemoglobin: 10.8 g/dL — ABNORMAL LOW (ref 12.0–15.0)
Lymphocytes Relative: 20.7 % (ref 12.0–46.0)
Lymphs Abs: 1.5 10*3/uL (ref 0.7–4.0)
MCHC: 32.6 g/dL (ref 30.0–36.0)
MCV: 77.1 fl — ABNORMAL LOW (ref 78.0–100.0)
Monocytes Absolute: 0.7 10*3/uL (ref 0.1–1.0)
Monocytes Relative: 10.1 % (ref 3.0–12.0)
Neutro Abs: 4.8 10*3/uL (ref 1.4–7.7)
Neutrophils Relative %: 66.1 % (ref 43.0–77.0)
Platelets: 253 10*3/uL (ref 150.0–400.0)
RBC: 4.32 Mil/uL (ref 3.87–5.11)
RDW: 18.7 % — ABNORMAL HIGH (ref 11.5–15.5)
WBC: 7.3 10*3/uL (ref 4.0–10.5)

## 2016-07-11 LAB — COMPREHENSIVE METABOLIC PANEL
ALT: 11 U/L (ref 0–35)
AST: 15 U/L (ref 0–37)
Albumin: 3.2 g/dL — ABNORMAL LOW (ref 3.5–5.2)
Alkaline Phosphatase: 63 U/L (ref 39–117)
BUN: 16 mg/dL (ref 6–23)
CO2: 29 mEq/L (ref 19–32)
Calcium: 8.6 mg/dL (ref 8.4–10.5)
Chloride: 102 mEq/L (ref 96–112)
Creatinine, Ser: 0.89 mg/dL (ref 0.40–1.20)
GFR: 70.43 mL/min (ref >60.00)
Glucose, Bld: 92 mg/dL (ref 70–99)
Potassium: 3.6 mEq/L (ref 3.5–5.1)
Sodium: 136 mEq/L (ref 135–145)
Total Bilirubin: 0.3 mg/dL (ref 0.2–1.2)
Total Protein: 7 g/dL (ref 6.0–8.3)

## 2016-07-11 LAB — IRON: Iron: 99 ug/dL (ref 42–145)

## 2016-07-11 LAB — VITAMIN D 25 HYDROXY (VIT D DEFICIENCY, FRACTURES): VITD: 32.86 ng/mL (ref 30.00–100.00)

## 2016-07-11 LAB — LIPID PANEL
Cholesterol: 114 mg/dL (ref 0–200)
HDL: 56.4 mg/dL (ref >39.00)
LDL Cholesterol: 43 mg/dL (ref 0–99)
NonHDL: 57.97
Total CHOL/HDL Ratio: 2
Triglycerides: 74 mg/dL (ref 0.0–149.0)
VLDL: 14.8 mg/dL (ref 0.0–40.0)

## 2016-07-11 LAB — FERRITIN: Ferritin: 16.3 ng/mL (ref 10.0–291.0)

## 2016-07-11 LAB — HEMOGLOBIN A1C: Hgb A1c MFr Bld: 6 % (ref 4.6–6.5)

## 2016-07-11 LAB — TSH: TSH: 1.53 u[IU]/mL (ref 0.35–4.50)

## 2016-07-11 LAB — VITAMIN B12: Vitamin B-12: 592 pg/mL (ref 211–911)

## 2016-07-11 NOTE — Assessment & Plan Note (Signed)
Check a1c Low sugar / carb diet Stressed regular exercise, weight loss  

## 2016-07-11 NOTE — Assessment & Plan Note (Signed)
BP Readings from Last 3 Encounters:  07/11/16 132/88  06/19/16 130/84  04/17/16 110/80   BP well controlled Current regimen effective and well tolerated Continue current medications at current doses Check labs

## 2016-07-11 NOTE — Progress Notes (Signed)
Subjective:    Patient ID: Roberta [name], female    DOB: 1965/11/01, 51 y.o.   MRN: [id number]  HPI The patient is here for follow up.  Hypertension: She is taking her medication daily. She is compliant with a low sodium diet.  She denies chest pain, palpitations, edema, shortness of breath and regular headaches. She is exercising regularly.  She does not monitor her blood pressure at home.    Prediabetes:  She is compliant with a low sugar/carbohydrate diet.  She is exercising regularly.  Obesity:  She joined the gym last week  She sees a trainer once a week and is doing classes the other days - she is just trying to get in the routine of going regularly.   She is trying to increase the frequency of eating small meals.  She has been told she does not eat enough.  She does not eat as frequently as she should.  Her appetite is decreased since the bypass. Working with trainer with food changes as well as exercise.    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  Medications and allergies reviewed with patient and updated if appropriate.  Patient Active Problem List   Diagnosis Date Noted  . Acute back pain 06/19/2016  . Bug bite 04/17/2016  . 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 Prediabetes 12/13/2015  . Obesity 12/13/2015  . Sensation of lump in throat 12/13/2015  . Leg edema 12/13/2015  . Dense breasts 12/13/2011  . 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 Hypertension 11/10/2011    Current Outpatient Prescriptions on File Prior to Visit  Medication Sig Dispense Refill  . BLACK COHOSH EXTRACT PO Take by mouth daily.    . cyclobenzaprine (FLEXERIL) 10 MG tablet Take 1 tablet (10 mg total) by mouth at bedtime. 30 tablet 0  . ferrous sulfate 325 (65 FE) MG tablet Take 325 mg by mouth daily with breakfast.    . furosemide (LASIX) 20 MG tablet Take 20 mg by mouth every other day.    . losartan-hydrochlorothiazide (HYZAAR) 100-25  MG tablet Take 1 tablet by mouth daily. 90 tablet 3  . Multiple Vitamin (MULTIVITAMIN) tablet Take 1 tablet by mouth daily.    . norethindrone (MICRONOR,CAMILA,ERRIN) 0.35 MG tablet Take 1 tablet by mouth daily.     No current facility-administered medications on file prior to visit.     Past Medical History:  Diagnosis Date  . Hypertension   . Obesity     Past Surgical History:  Procedure Laterality Date  . CHOLECYSTECTOMY    . GASTRIC BYPASS    . KNEE SURGERY  2002   Ligament repair    Social History   Social History  . Marital status: Legally Separated    Spouse name: N/A  . Number of children: N/A  . Years of education: N/A   Social History Main Topics  . Smoking status: Never Smoker  . Smokeless tobacco: Never Used  . Alcohol use No  . Drug use: No  . Sexual activity: Not on file   Other Topics Concern  . Not on file   Social History Narrative  . No narrative on file    Family History  Problem Relation Age of Onset  . Cancer Maternal Aunt        lung  . Cancer Maternal Uncle        lung    Review of Systems  Constitutional:  Negative for chills and fever.  Respiratory: Negative for cough, shortness of breath and wheezing.   Cardiovascular: Negative for chest pain, palpitations and leg swelling.  Neurological: Negative for light-headedness, numbness and headaches.       Objective:   Vitals:   07/11/16 1111  BP: 132/88  Pulse: 99  Resp: 16  Temp: 98.1 F (36.7 C)   Wt Readings from Last 3 Encounters:  07/11/16 287 lb (130.2 kg)  06/19/16 281 lb (127.5 kg)  04/17/16 277 lb (125.6 kg)   Body mass index is 46.32 kg/m.   Physical Exam    Constitutional: Appears well-developed and well-nourished. No distress.  HENT:  Head: Normocephalic and atraumatic.  Neck: Neck supple. No tracheal deviation present. No thyromegaly present.  No cervical lymphadenopathy Cardiovascular: Normal rate, regular rhythm and normal heart sounds.   No murmur  heard. No carotid bruit .  No edema Pulmonary/Chest: Effort normal and breath sounds normal. No respiratory distress. No has no wheezes. No rales.  Skin: Skin is warm and dry. Not diaphoretic.  Psychiatric: Normal mood and affect. Behavior is normal.      Assessment & Plan:    See Problem List for Assessment and Plan of chronic medical problems.

## 2016-07-11 NOTE — Assessment & Plan Note (Addendum)
Has joined the gym - discussed goal of 5 days a week Working on increasing frequency - may not eat enough since gastric bypass Working with trainer for exercise and weight loss Not focusing on weight loss - just trying to focus on healthy habits and how her clothes fit F/u at next visit

## 2016-07-11 NOTE — Patient Instructions (Addendum)
  Test(s) ordered today. Your results will be released to [place] (or called to you) after review, usually within 72hours after test completion. If any changes need to be made, you will be notified at that same time.  All other Health Maintenance issues reviewed.   All recommended immunizations and age-appropriate screenings are up-to-date or discussed.  No immunizations administered today.   Medications reviewed and updated.  No changes recommended at this time.   Please followup in 6 months for a physical

## 2016-07-11 NOTE — Assessment & Plan Note (Signed)
Taking an MVI Check B12, D, iron/ferritin, cbc and basic blood work

## 2016-07-12 LAB — HEPATITIS C ANTIBODY: HCV Ab: NEGATIVE

## 2016-07-13 ENCOUNTER — Encounter: Payer: Self-pay | Admitting: Internal Medicine

## 2016-08-15 ENCOUNTER — Encounter: Payer: Self-pay | Admitting: Internal Medicine

## 2016-11-08 ENCOUNTER — Encounter: Payer: Self-pay | Admitting: Internal Medicine

## 2017-01-02 HISTORY — PX: MASTECTOMY: SHX3

## 2017-01-20 ENCOUNTER — Other Ambulatory Visit: Payer: Self-pay | Admitting: Internal Medicine

## 2017-01-21 DIAGNOSIS — D649 Anemia, unspecified: Secondary | ICD-10-CM | POA: Insufficient documentation

## 2017-01-21 DIAGNOSIS — D509 Iron deficiency anemia, unspecified: Secondary | ICD-10-CM | POA: Insufficient documentation

## 2017-01-21 NOTE — Patient Instructions (Addendum)

## 2017-01-21 NOTE — Progress Notes (Signed)
Subjective:    Patient ID: Roberta [name], female    DOB: [birth year], 52 y.o.   MRN: [id number]  HPI The patient is here for follow up.  Hypertension: She is taking her medication daily. She is compliant with a low sodium diet.  She denies chest pain, palpitations, shortness of breath and regular headaches. She is exercising.      Prediabetes:  She is compliant with a low sugar/carbohydrate diet.  She is exercising.  Anemia:  Her anemia stated after her gastric bypass.  Her last menstrual cycle was in July 2018.    She saw gyn for hirsutism and she was placed on aldactone 50 mg daily and her BP dropped.  She developed muscle cramping.  She had blood work done in December- kidney function was decreased.  She stopped the medication.  She has an appointment with dermatology to discuss other options for the hair growth..    Obesity:  She is s/p gastric bypass.  She is back to the gym - she did take a hiatus from the gym.  She is trying to eat more healthy.  She would like to lose some weight.    Issues with her hands:  Both hands was painful a while ago - the center of the palm, but is no longer painful.  The right hand now just feels weak - she has difficulty gripping things and holding things.  She denies numbness/tingling.     Medications and allergies reviewed with patient and updated if appropriate.  Patient Active Problem List   Diagnosis Date Noted  . Anemia 01/21/2017  . Acute back pain 06/19/2016  . Bug bite 04/17/2016  . 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 Prediabetes 12/13/2015  . Obesity 12/13/2015  . Sensation of lump in throat 12/13/2015  . Leg edema 12/13/2015  . Dense breasts 12/13/2011  . 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 Hypertension 11/10/2011    Current Outpatient Medications on File Prior to Visit  Medication Sig Dispense Refill  . BLACK COHOSH EXTRACT PO Take by mouth daily.    . cyclobenzaprine (FLEXERIL) 10 MG tablet Take 1 tablet (10 mg total)  by mouth at bedtime. 30 tablet 0  . ferrous sulfate 325 (65 FE) MG tablet Take 325 mg by mouth daily with breakfast.    . furosemide (LASIX) 20 MG tablet Take 20 mg by mouth every other day.    . losartan-hydrochlorothiazide (HYZAAR) 100-25 MG tablet Take 1 tablet by mouth daily. 90 tablet 3  . Multiple Vitamin (MULTIVITAMIN) tablet Take 1 tablet by mouth daily.    . norethindrone (MICRONOR,CAMILA,ERRIN) 0.35 MG tablet Take 1 tablet by mouth daily.     No current facility-administered medications on file prior to visit.     Past Medical History:  Diagnosis Date  . Hypertension   . Obesity     Past Surgical History:  Procedure Laterality Date  . CHOLECYSTECTOMY    . GASTRIC BYPASS    . KNEE SURGERY  2002   Ligament repair    Social History   Socioeconomic History  . Marital status: Legally Separated    Spouse name: None  . Number of children: None  . Years of education: None  . Highest education level: None  Social Needs  . Financial resource strain: None  . Food insecurity - worry: None  . Food insecurity - inability: None  . Transportation needs - medical: None  . Transportation needs - non-medical: None  Occupational History  .  None  Tobacco Use  . Smoking status: Never Smoker  . Smokeless tobacco: Never Used  Substance and Sexual Activity  . Alcohol use: No  . Drug use: No  . Sexual activity: None  Other Topics Concern  . None  Social History Narrative  . None    Family History  Problem Relation Age of Onset  . Cancer Maternal Aunt        lung  . Cancer Maternal Uncle        lung    Review of Systems  Constitutional: Negative for chills, fatigue (energy level medium) and fever.  Respiratory: Negative for cough, shortness of breath and wheezing.   Cardiovascular: Positive for leg swelling (mild, chronic ). Negative for chest pain and palpitations.  Neurological: Negative for dizziness, light-headedness and headaches.       Objective:   Vitals:     01/22/17 1117  BP: 132/82  Pulse: 84  Resp: 16  Temp: 98.1 F (36.7 C)  SpO2: 98%   Wt Readings from Last 3 Encounters:  01/22/17 287 lb (130.2 kg)  07/11/16 287 lb (130.2 kg)  06/19/16 281 lb (127.5 kg)   Body mass index is 46.32 kg/m.   Physical Exam    Constitutional: Appears well-developed and well-nourished. No distress.  HENT:  Head: Normocephalic and atraumatic.  Neck: Neck supple. No tracheal deviation present. No thyromegaly present.  No cervical lymphadenopathy Cardiovascular: Normal rate, regular rhythm and normal heart sounds.   No murmur heard. No carotid bruit .  No edema Pulmonary/Chest: Effort normal and breath sounds normal. No respiratory distress. No has no wheezes. No rales.  Skin: Skin is warm and dry. Not diaphoretic.  Psychiatric: Normal mood and affect. Behavior is normal.      Assessment & Plan:    See Problem List for Assessment and Plan of chronic medical problems.

## 2017-01-22 ENCOUNTER — Encounter: Payer: Self-pay | Admitting: Internal Medicine

## 2017-01-22 ENCOUNTER — Other Ambulatory Visit (INDEPENDENT_AMBULATORY_CARE_PROVIDER_SITE_OTHER): Payer: BC Managed Care – PPO

## 2017-01-22 ENCOUNTER — Ambulatory Visit: Payer: BC Managed Care – PPO | Admitting: Internal Medicine

## 2017-01-22 VITALS — BP 132/82 | HR 84 | Temp 98.1°F | Resp 16 | Wt 287.0 lb

## 2017-01-22 DIAGNOSIS — I10 Essential (primary) hypertension: Secondary | ICD-10-CM

## 2017-01-22 DIAGNOSIS — Z6841 Body Mass Index (BMI) 40.0 and over, adult: Secondary | ICD-10-CM | POA: Diagnosis not present

## 2017-01-22 DIAGNOSIS — Z9884 Bariatric surgery status: Secondary | ICD-10-CM | POA: Diagnosis not present

## 2017-01-22 DIAGNOSIS — R600 Localized edema: Secondary | ICD-10-CM

## 2017-01-22 DIAGNOSIS — D649 Anemia, unspecified: Secondary | ICD-10-CM

## 2017-01-22 DIAGNOSIS — R7303 Prediabetes: Secondary | ICD-10-CM

## 2017-01-22 LAB — COMPREHENSIVE METABOLIC PANEL
ALT: 15 U/L (ref 0–35)
AST: 21 U/L (ref 0–37)
Albumin: 3.5 g/dL (ref 3.5–5.2)
Alkaline Phosphatase: 67 U/L (ref 39–117)
BUN: 19 mg/dL (ref 6–23)
CO2: 31 mEq/L (ref 19–32)
Calcium: 8.8 mg/dL (ref 8.4–10.5)
Chloride: 99 mEq/L (ref 96–112)
Creatinine, Ser: 0.94 mg/dL (ref 0.40–1.20)
GFR: 65.99 mL/min (ref >60.00)
Glucose, Bld: 114 mg/dL — ABNORMAL HIGH (ref 70–99)
Potassium: 3.5 mEq/L (ref 3.5–5.1)
Sodium: 136 mEq/L (ref 135–145)
Total Bilirubin: 0.6 mg/dL (ref 0.2–1.2)
Total Protein: 7.4 g/dL (ref 6.0–8.3)

## 2017-01-22 LAB — CBC WITH DIFFERENTIAL/PLATELET
Basophils Absolute: 0.1 10*3/uL (ref 0.0–0.1)
Basophils Relative: 1.3 % (ref 0.0–3.0)
Eosinophils Absolute: 0.2 10*3/uL (ref 0.0–0.7)
Eosinophils Relative: 2.8 % (ref 0.0–5.0)
HCT: 37.3 % (ref 36.0–46.0)
Hemoglobin: 11.8 g/dL — ABNORMAL LOW (ref 12.0–15.0)
Lymphocytes Relative: 26 % (ref 12.0–46.0)
Lymphs Abs: 1.7 10*3/uL (ref 0.7–4.0)
MCHC: 31.8 g/dL (ref 30.0–36.0)
MCV: 78.5 fl (ref 78.0–100.0)
Monocytes Absolute: 0.8 10*3/uL (ref 0.1–1.0)
Monocytes Relative: 11.8 % (ref 3.0–12.0)
Neutro Abs: 3.7 10*3/uL (ref 1.4–7.7)
Neutrophils Relative %: 58.1 % (ref 43.0–77.0)
Platelets: 291 10*3/uL (ref 150.0–400.0)
RBC: 4.75 Mil/uL (ref 3.87–5.11)
RDW: 17.9 % — ABNORMAL HIGH (ref 11.5–15.5)
WBC: 6.4 10*3/uL (ref 4.0–10.5)

## 2017-01-22 LAB — VITAMIN D 25 HYDROXY (VIT D DEFICIENCY, FRACTURES): VITD: 40.09 ng/mL (ref 30.00–100.00)

## 2017-01-22 LAB — IRON: Iron: 162 ug/dL — ABNORMAL HIGH (ref 42–145)

## 2017-01-22 LAB — FERRITIN: Ferritin: 14.5 ng/mL (ref 10.0–291.0)

## 2017-01-22 LAB — VITAMIN B12: Vitamin B-12: 562 pg/mL (ref 211–911)

## 2017-01-22 LAB — HEMOGLOBIN A1C: Hgb A1c MFr Bld: 6.3 % (ref 4.6–6.5)

## 2017-01-22 MED ORDER — LOSARTAN POTASSIUM-HCTZ 100-25 MG PO TABS: 1.0000 | ORAL_TABLET | Freq: Every day | ORAL | 3 refills | Status: DC

## 2017-01-22 NOTE — Assessment & Plan Note (Signed)
Continue weight loss efforts Exercising Decreased portions, healthy diet F/u in 6 months

## 2017-01-22 NOTE — Assessment & Plan Note (Signed)
Associated with some malabsorption Will check vitamin d, vitamin b12, cbc, iron, ferritin

## 2017-01-22 NOTE — Assessment & Plan Note (Signed)
BP Readings from Last 3 Encounters:  01/22/17 132/82  07/11/16 132/88  06/19/16 130/84   BP well controlled Current regimen effective and well tolerated Continue current medications at current doses Cmp

## 2017-01-22 NOTE — Assessment & Plan Note (Signed)
On hctz daily Taking lasix every other day cmp At next visit will try to eliminate one of these

## 2017-01-22 NOTE — Assessment & Plan Note (Addendum)
Last menses 07/2016, s/p gastric bypass Anemia thought to be related to surgery Check cbc, iron, ferritin

## 2017-01-22 NOTE — Assessment & Plan Note (Signed)
Check a1c Low sugar / carb diet Stressed regular exercise, weight loss  

## 2017-01-25 ENCOUNTER — Telehealth: Payer: Self-pay | Admitting: Internal Medicine

## 2017-01-25 ENCOUNTER — Encounter: Payer: Self-pay | Admitting: Internal Medicine

## 2017-01-25 NOTE — Telephone Encounter (Signed)
Copied from [place]. Topic: General - Other >> Jan 25, 2017  3:10 PM Carolyn Stare wrote:  Pt was told by her pharmacy [facility] that they can not get the following med  losartan-hydrochlorothiazide (HYZAAR) 100-25 MG tablet  They told her you can write the RX separately or change to something else    [facility] [address]

## 2017-01-26 MED ORDER — HYDROCHLOROTHIAZIDE 25 MG PO TABS: 25.0000 mg | ORAL_TABLET | Freq: Every day | ORAL | 3 refills | Status: DC

## 2017-01-26 MED ORDER — LOSARTAN POTASSIUM-HCTZ 100-25 MG PO TABS: 1.0000 | ORAL_TABLET | Freq: Every day | ORAL | 0 refills | Status: DC

## 2017-01-26 MED ORDER — LOSARTAN POTASSIUM 100 MG PO TABS: 100.0000 mg | ORAL_TABLET | Freq: Every day | ORAL | 3 refills | Status: DC

## 2017-01-26 NOTE — Telephone Encounter (Signed)
Spoke with pt, she is going to contact [facility] and see if they have losartan at all.. Will call me back if it needs to be sent to [facility] on [address]

## 2017-01-26 NOTE — Telephone Encounter (Signed)
Okay to change to separate medications?

## 2017-01-26 NOTE — Telephone Encounter (Signed)
LVM with pt, [facility] does not have either Losartan RX, RX sent to [facility] per pts approval.

## 2017-01-26 NOTE — Telephone Encounter (Signed)
Pt is calling back today to follow up on this. States she is on her last one today. Please advise.

## 2017-01-26 NOTE — Telephone Encounter (Signed)
sent 

## 2017-02-06 ENCOUNTER — Ambulatory Visit: Payer: BC Managed Care – PPO | Admitting: Family Medicine

## 2017-02-06 ENCOUNTER — Encounter: Payer: Self-pay | Admitting: Family Medicine

## 2017-02-06 VITALS — BP 134/72 | HR 71 | Temp 98.2°F | Ht 66.0 in | Wt 288.0 lb

## 2017-02-06 DIAGNOSIS — M25552 Pain in left hip: Secondary | ICD-10-CM | POA: Diagnosis not present

## 2017-02-06 MED ORDER — MELOXICAM 15 MG PO TABS: 15.0000 mg | ORAL_TABLET | Freq: Every day | ORAL | 0 refills | Status: DC

## 2017-02-06 NOTE — Progress Notes (Signed)
Roberta [name] - 52 y.o. female MRN [id number]  Date of birth: 1965/01/23  SUBJECTIVE:  Including CC & ROS.  Chief Complaint  Patient presents with  . Hip Pain    Roberta [name] is a 52 y.o. female that is presenting with left hip pain. Pain started three days ago. She went bowling on Saturday night. Denies injury or surgeries. Walking triggers the pain. She has been using heat. Pain located in the anterior groin. Described as an ache. She took motrin with no improvement. No radiation. Pain is intermittent and moderate.    Review of Systems  Constitutional: Negative for fever.  HENT: Negative for sinus pressure.   Respiratory: Negative for shortness of breath.   Cardiovascular: Negative for chest pain.  Gastrointestinal: Negative for abdominal distention.  Musculoskeletal: Negative for back pain and gait problem.  Skin: Negative for color change.  Neurological: Negative for weakness.  Hematological: Negative for adenopathy.  Psychiatric/Behavioral: Negative for agitation.    HISTORY: Past Medical, Surgical, Social, and Family History Reviewed & Updated per EMR.   Pertinent Historical Findings include:  Past Medical History:  Diagnosis Date  . Hypertension   . Obesity     Past Surgical History:  Procedure Laterality Date  . CHOLECYSTECTOMY    . GASTRIC BYPASS    . KNEE SURGERY  2002   Ligament repair    Allergies  Allergen Reactions  . Lisinopril Swelling    Swelling of the tongue  . Penicillins     Pt told as a child-unknown reaction.  . Tetanus Toxoids Swelling    Swelling at site of injection  . Norvasc [Amlodipine Besylate] Rash    Family History  Problem Relation Age of Onset  . Cancer Maternal Aunt        lung  . Cancer Maternal Uncle        lung     Social History   Socioeconomic History  . Marital status: Legally Separated    Spouse name: Not on file  . Number of children: Not on file  . Years of education: Not on file  . Highest education  level: Not on file  Social Needs  . Financial resource strain: Not on file  . Food insecurity - worry: Not on file  . Food insecurity - inability: Not on file  . Transportation needs - medical: Not on file  . Transportation needs - non-medical: Not on file  Occupational History  . Not on file  Tobacco Use  . Smoking status: Never Smoker  . Smokeless tobacco: Never Used  Substance and Sexual Activity  . Alcohol use: No  . Drug use: No  . Sexual activity: Not on file  Other Topics Concern  . Not on file  Social History Narrative  . Not on file     PHYSICAL EXAM:  VS: BP 134/72 (BP Location: Left Arm, Patient Position: Sitting, Cuff Size: Normal)   Pulse 71   Temp 98.2 F (36.8 C) (Oral)   Ht 5\' 6"  (1.676 m)   Wt 288 lb (130.6 kg)   SpO2 100%   BMI 46.48 kg/m  Physical Exam Gen: NAD, alert, cooperative with exam, well-appearing ENT: normal lips, normal nasal mucosa,  Eye: normal EOM, normal conjunctiva and lids CV:  no edema, +2 pedal pulses   Resp: no accessory muscle use, non-labored,   Skin: no rashes, no areas of induration  Neuro: normal tone, normal sensation to touch Psych:  normal insight, alert and oriented MSK:  Hip:  Normal IR and ER ROM Pelvic alignment unremarkable to inspection and palpation. Greater trochanter with mild tenderness to palpation. No tenderness over piriformis. No SI joint tenderness and normal minimal SI movement. Negative SLR b/l Neurovascularly intact     ASSESSMENT & PLAN:   Left hip pain Appears to be an acute flare of tendinitis. No radicular symptoms and strong on exam. Doesn't usually go bowling so performed a new maneuver. Possible for cam or pincher lesion.  - mobic - counseled on ice - xrays - if no improvement consider  pt vs injection

## 2017-02-06 NOTE — Patient Instructions (Signed)
Please take the meloxicam for 14 days straight and then as needed. you can take Tylenol with the meloxicam. please try to ice this area. Please follow-up with me in 4-6 weeks if her pain is unimproved.

## 2017-02-10 DIAGNOSIS — M25552 Pain in left hip: Secondary | ICD-10-CM | POA: Insufficient documentation

## 2017-02-10 NOTE — Assessment & Plan Note (Addendum)
Appears to be an acute flare of tendinitis. No radicular symptoms and strong on exam. Doesn't usually go bowling so performed a new maneuver. Possible for cam or pincher lesion.  - mobic - counseled on ice - xrays - if no improvement consider  pt vs injection

## 2017-05-25 ENCOUNTER — Other Ambulatory Visit: Payer: Self-pay | Admitting: Internal Medicine

## 2017-06-12 ENCOUNTER — Other Ambulatory Visit: Payer: Self-pay | Admitting: Obstetrics and Gynecology

## 2017-06-12 DIAGNOSIS — Z1231 Encounter for screening mammogram for malignant neoplasm of breast: Secondary | ICD-10-CM

## 2017-06-24 NOTE — Progress Notes (Signed)
Subjective:    Patient ID: Roberta [name], female    DOB: 08-29-1965, 52 y.o.   MRN: [id number]  HPI She is here for a physical exam.   She is concerned about her weight.  She had gastric bypass in 2005 and lost a lot of weight the first year.  She has been gaining weight.  She does not feel hungry a lot of the times.  She eats and drinks at the same time, which she knows she should not do.  She is trying to eat clean.  She is trying to decrease her sugar intake, but has been craving sugars more and more.  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  She was exercising earlier this year and did not feel like that was helping maintain her weight.  She does plan on restarting regular exercise.  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  She is currently using a prescription strength over-the-counter deodorant.  She knows her weight may be contributing.  Medications and allergies reviewed with patient and updated if appropriate.  Patient Active Problem List   Diagnosis Date Noted  . Left hip pain 02/10/2017  . Anemia 01/21/2017  . Acute back pain 06/19/2016  . Bug bite 04/17/2016  . 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 Prediabetes 12/13/2015  . Obesity 12/13/2015  . Sensation of lump in throat 12/13/2015  . Leg edema 12/13/2015  . Dense breasts 12/13/2011  . 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 Hypertension 11/10/2011    Current Outpatient Medications on File Prior to Visit  Medication Sig Dispense Refill  . BLACK COHOSH EXTRACT PO Take by mouth daily.    . furosemide (LASIX) 20 MG tablet Take 20 mg by mouth every other day.    . losartan-hydrochlorothiazide (HYZAAR) 100-25 MG tablet TAKE 1 TABLET BY MOUTH EVERY DAY 90 tablet 1  . meloxicam (MOBIC) 15 MG tablet Take 1 tablet (15 mg total) by mouth daily. 30 tablet 0  . Multiple Vitamin (MULTIVITAMIN) tablet Take 1 tablet by mouth daily.    . norethindrone (MICRONOR,CAMILA,ERRIN)  0.35 MG tablet Take 1 tablet by mouth daily.     No current facility-administered medications on file prior to visit.     Past Medical History:  Diagnosis Date  . Hypertension   . Obesity     Past Surgical History:  Procedure Laterality Date  . CHOLECYSTECTOMY    . GASTRIC BYPASS    . KNEE SURGERY  2002   Ligament repair    Social History   Socioeconomic History  . Marital status: Legally Separated    Spouse name: Not on file  . Number of children: Not on file  . Years of education: Not on file  . Highest education level: Not on file  Occupational History  . Not on file  Social Needs  . Financial resource strain: Not on file  . Food insecurity:    Worry: Not on file    Inability: Not on file  . Transportation needs:    Medical: Not on file    Non-medical: Not on file  Tobacco Use  . Smoking status: Never Smoker  . Smokeless tobacco: Never Used  Substance and Sexual Activity  . Alcohol use: No  . Drug use: No  . Sexual activity: Not on file  Lifestyle  . Physical activity:    Days per week: Not on file    Minutes per session: Not on  file  . Stress: Not on file  Relationships  . Social connections:    Talks on phone: Not on file    Gets together: Not on file    Attends religious service: Not on file    Active member of club or organization: Not on file    Attends meetings of clubs or organizations: Not on file    Relationship status: Not on file  Other Topics Concern  . Not on file  Social History Narrative  . Not on file    Family History  Problem Relation Age of Onset  . Cancer Maternal Aunt        lung  . Cancer Maternal Uncle        lung    Review of Systems  Constitutional: Negative for chills and fever.  Eyes: Negative for visual disturbance.  Respiratory: Negative for cough, shortness of breath and wheezing.   Cardiovascular: Negative for chest pain, palpitations and leg swelling.  Gastrointestinal: Negative for abdominal pain, blood in  stool, constipation, diarrhea and nausea.       No gerd  Genitourinary: Negative for dysuria and hematuria.  Musculoskeletal: Positive for back pain. Negative for arthralgias.  Skin: Negative for color change and rash.  Neurological: Positive for headaches (occ). Negative for light-headedness.  Psychiatric/Behavioral: Negative for dysphoric mood. The patient is not nervous/anxious.        Objective:   Vitals:   06/25/17 1456  BP: 112/84  Pulse: 71  Resp: 16  Temp: 98.5 F (36.9 C)  SpO2: 98%   Filed Weights   06/25/17 1456  Weight: 293 lb (132.9 kg)   Body mass index is 47.29 kg/m.  Wt Readings from Last 3 Encounters:  06/25/17 293 lb (132.9 kg)  02/06/17 288 lb (130.6 kg)  01/22/17 287 lb (130.2 kg)     Physical Exam Constitutional: She appears well-developed and well-nourished. No distress.  HENT:  Head: Normocephalic and atraumatic.  Right Ear: External ear normal. Normal ear canal and TM Left Ear: External ear normal.  Normal ear canal and TM Mouth/Throat: Oropharynx is clear and moist.  Eyes: Conjunctivae and EOM are normal.  Neck: Neck supple. No tracheal deviation present. No thyromegaly present.  No carotid bruit  Cardiovascular: Normal rate, regular rhythm and normal heart sounds.   No murmur heard.  No edema. Pulmonary/Chest: Effort normal and breath sounds normal. No respiratory distress. She has no wheezes. She has no rales.  Breast: deferred to Gyn Abdominal: Soft. She exhibits no distension. There is no tenderness.  Lymphadenopathy: She has no cervical adenopathy.  Skin: Skin is warm and dry. She is not diaphoretic.  Psychiatric: She has a normal mood and affect. Her behavior is normal.        Assessment & Plan:   Physical exam: Screening blood work  ordered Immunizations   up to date Colonoscopy   Up to date  Mammogram  Scheduled for this week Gyn  Up to date  Eye exams    Up to date  EKG   None on file Exercise  None - will  restart Weight   Discussed weight at length - wants to lose weight - has been gaining weight Skin   No concerns Substance abuse    none  See Problem List for Assessment and Plan of chronic medical problems.   Follow-up in 6 months

## 2017-06-24 NOTE — Patient Instructions (Addendum)
Test(s) ordered today. Your results will be released to [place] (or called to you) after review, usually within 72hours after test completion. If any changes need to be made, you will be notified at that same time.  All other Health Maintenance issues reviewed.   All recommended immunizations and age-appropriate screenings are up-to-date or discussed.  No immunizations administered today.   Medications reviewed and updated.  Changes include trying drysol for your deodorant.   Your prescription(s) have been submitted to your pharmacy. Please take as directed and contact our office if you believe you are having problem(s) with the medication(s).   Please followup in 6 months   Health Maintenance, Female Adopting a healthy lifestyle and getting preventive care can go a long way to promote health and wellness. Talk with your health care provider about what schedule of regular examinations is right for you. This is a good chance for you to check in with your provider about disease prevention and staying healthy. In between checkups, there are plenty of things you can do on your own. Experts have done a lot of research about which lifestyle changes and preventive measures are most likely to keep you healthy. Ask your health care provider for more information. Weight and diet Eat a healthy diet  Be sure to include plenty of vegetables, fruits, low-fat dairy products, and lean protein.  Do not eat a lot of foods high in solid fats, added sugars, or salt.  Get regular exercise. This is one of the most important things you can do for your health. ? Most adults should exercise for at least 150 minutes each week. The exercise should increase your heart rate and make you sweat (moderate-intensity exercise). ? Most adults should also do strengthening exercises at least twice a week. This is in addition to the moderate-intensity exercise.  Maintain a healthy weight  Body mass index (BMI) is a  measurement that can be used to identify possible weight problems. It estimates body fat based on height and weight. Your health care provider can help determine your BMI and help you achieve or maintain a healthy weight.  For females 89 years of age and older: ? A BMI below 18.5 is considered underweight. ? A BMI of 18.5 to 24.9 is normal. ? A BMI of 25 to 29.9 is considered overweight. ? A BMI of 30 and above is considered obese.  Watch levels of cholesterol and blood lipids  You should start having your blood tested for lipids and cholesterol at 54 years of age, then have this test every 5 years.  You may need to have your cholesterol levels checked more often if: ? Your lipid or cholesterol levels are high. ? You are older than 54 years of age. ? You are at high risk for heart disease.  Cancer screening Lung Cancer  Lung cancer screening is recommended for adults 17-4 years old who are at high risk for lung cancer because of a history of smoking.  A yearly low-dose CT scan of the lungs is recommended for people who: ? Currently smoke. ? Have quit within the past 15 years. ? Have at least a 30-pack-year history of smoking. A pack year is smoking an average of one pack of cigarettes a day for 1 year.  Yearly screening should continue until it has been 15 years since you quit.  Yearly screening should stop if you develop a health problem that would prevent you from having lung cancer treatment.  Breast Cancer  Practice  breast self-awareness. This means understanding how your breasts normally appear and feel.  It also means doing regular breast self-exams. Let your health care provider know about any changes, no matter how small.  If you are in your 20s or 30s, you should have a clinical breast exam (CBE) by a health care provider every 1-3 years as part of a regular health exam.  If you are 49 or older, have a CBE every year. Also consider having a breast X-ray (mammogram)  every year.  If you have a family history of breast cancer, talk to your health care provider about genetic screening.  If you are at high risk for breast cancer, talk to your health care provider about having an MRI and a mammogram every year.  Breast cancer gene (BRCA) assessment is recommended for women who have family members with BRCA-related cancers. BRCA-related cancers include: ? Breast. ? Ovarian. ? Tubal. ? Peritoneal cancers.  Results of the assessment will determine the need for genetic counseling and BRCA1 and BRCA2 testing.  Cervical Cancer Your health care provider may recommend that you be screened regularly for cancer of the pelvic organs (ovaries, uterus, and vagina). This screening involves a pelvic examination, including checking for microscopic changes to the surface of your cervix (Pap test). You may be encouraged to have this screening done every 3 years, beginning at age 67.  For women ages 52-65, health care providers may recommend pelvic exams and Pap testing every 3 years, or they may recommend the Pap and pelvic exam, combined with testing for human papilloma virus (HPV), every 5 years. Some types of HPV increase your risk of cervical cancer. Testing for HPV may also be done on women of any age with unclear Pap test results.  Other health care providers may not recommend any screening for nonpregnant women who are considered low risk for pelvic cancer and who do not have symptoms. Ask your health care provider if a screening pelvic exam is right for you.  If you have had past treatment for cervical cancer or a condition that could lead to cancer, you need Pap tests and screening for cancer for at least 20 years after your treatment. If Pap tests have been discontinued, your risk factors (such as having a new sexual partner) need to be reassessed to determine if screening should resume. Some women have medical problems that increase the chance of getting cervical  cancer. In these cases, your health care provider may recommend more frequent screening and Pap tests.  Colorectal Cancer  This type of cancer can be detected and often prevented.  Routine colorectal cancer screening usually begins at 54 years of age and continues through 54 years of age.  Your health care provider may recommend screening at an earlier age if you have risk factors for colon cancer.  Your health care provider may also recommend using home test kits to check for hidden blood in the stool.  A small camera at the end of a tube can be used to examine your colon directly (sigmoidoscopy or colonoscopy). This is done to check for the earliest forms of colorectal cancer.  Routine screening usually begins at age 40.  Direct examination of the colon should be repeated every 5-10 years through 54 years of age. However, you may need to be screened more often if early forms of precancerous polyps or small growths are found.  Skin Cancer  Check your skin from head to toe regularly.  Tell your health care provider  about any new moles or changes in moles, especially if there is a change in a mole's shape or color.  Also tell your health care provider if you have a mole that is larger than the size of a pencil eraser.  Always use sunscreen. Apply sunscreen liberally and repeatedly throughout the day.  Protect yourself by wearing long sleeves, pants, a wide-brimmed hat, and sunglasses whenever you are outside.  Heart disease, diabetes, and high blood pressure  High blood pressure causes heart disease and increases the risk of stroke. High blood pressure is more likely to develop in: ? People who have blood pressure in the high end of the normal range (130-139/85-89 mm Hg). ? People who are overweight or obese. ? People who are African American.  If you are 83-3 years of age, have your blood pressure checked every 3-5 years. If you are 79 years of age or older, have your blood  pressure checked every year. You should have your blood pressure measured twice-once when you are at a hospital or clinic, and once when you are not at a hospital or clinic. Record the average of the two measurements. To check your blood pressure when you are not at a hospital or clinic, you can use: ? An automated blood pressure machine at a pharmacy. ? A home blood pressure monitor.  If you are between 95 years and 47 years old, ask your health care provider if you should take aspirin to prevent strokes.  Have regular diabetes screenings. This involves taking a blood sample to check your fasting blood sugar level. ? If you are at a normal weight and have a low risk for diabetes, have this test once every three years after 54 years of age. ? If you are overweight and have a high risk for diabetes, consider being tested at a younger age or more often. Preventing infection Hepatitis B  If you have a higher risk for hepatitis B, you should be screened for this virus. You are considered at high risk for hepatitis B if: ? You were born in a country where hepatitis B is common. Ask your health care provider which countries are considered high risk. ? Your parents were born in a high-risk country, and you have not been immunized against hepatitis B (hepatitis B vaccine). ? You have HIV or AIDS. ? You use needles to inject street drugs. ? You live with someone who has hepatitis B. ? You have had sex with someone who has hepatitis B. ? You get hemodialysis treatment. ? You take certain medicines for conditions, including cancer, organ transplantation, and autoimmune conditions.  Hepatitis C  Blood testing is recommended for: ? Everyone born from [birth year] through 1967. ? Anyone with known risk factors for hepatitis C.  Sexually transmitted infections (STIs)  You should be screened for sexually transmitted infections (STIs) including gonorrhea and chlamydia if: ? You are sexually active and are  younger than 54 years of age. ? You are older than 54 years of age and your health care provider tells you that you are at risk for this type of infection. ? Your sexual activity has changed since you were last screened and you are at an increased risk for chlamydia or gonorrhea. Ask your health care provider if you are at risk.  If you do not have HIV, but are at risk, it may be recommended that you take a prescription medicine daily to prevent HIV infection. This is called pre-exposure prophylaxis (PrEP). You are  considered at risk if: ? You are sexually active and do not regularly use condoms or know the HIV status of your partner(s). ? You take drugs by injection. ? You are sexually active with a partner who has HIV.  Talk with your health care provider about whether you are at high risk of being infected with HIV. If you choose to begin PrEP, you should first be tested for HIV. You should then be tested every 3 months for as long as you are taking PrEP. Pregnancy  If you are premenopausal and you may become pregnant, ask your health care provider about preconception counseling.  If you may become pregnant, take 400 to 800 micrograms (mcg) of folic acid every day.  If you want to prevent pregnancy, talk to your health care provider about birth control (contraception). Osteoporosis and menopause  Osteoporosis is a disease in which the bones lose minerals and strength with aging. This can result in serious bone fractures. Your risk for osteoporosis can be identified using a bone density scan.  If you are 39 years of age or older, or if you are at risk for osteoporosis and fractures, ask your health care provider if you should be screened.  Ask your health care provider whether you should take a calcium or vitamin D supplement to lower your risk for osteoporosis.  Menopause may have certain physical symptoms and risks.  Hormone replacement therapy may reduce some of these symptoms and  risks. Talk to your health care provider about whether hormone replacement therapy is right for you. Follow these instructions at home:  Schedule regular health, dental, and eye exams.  Stay current with your immunizations.  Do not use any tobacco products including cigarettes, chewing tobacco, or electronic cigarettes.  If you are pregnant, do not drink alcohol.  If you are breastfeeding, limit how much and how often you drink alcohol.  Limit alcohol intake to no more than 1 drink per day for nonpregnant women. One drink equals 12 ounces of beer, 5 ounces of wine, or 1 ounces of hard liquor.  Do not use street drugs.  Do not share needles.  Ask your health care provider for help if you need support or information about quitting drugs.  Tell your health care provider if you often feel depressed.  Tell your health care provider if you have ever been abused or do not feel safe at home. This information is not intended to replace advice given to you by your health care provider. Make sure you discuss any questions you have with your health care provider. Document Released: 07/04/2010 Document Revised: 05/27/2015 Document Reviewed: 09/22/2014 Elsevier Interactive Patient Education  [employer].

## 2017-06-25 ENCOUNTER — Encounter: Payer: Self-pay | Admitting: Internal Medicine

## 2017-06-25 ENCOUNTER — Ambulatory Visit (INDEPENDENT_AMBULATORY_CARE_PROVIDER_SITE_OTHER): Payer: BC Managed Care – PPO | Admitting: Internal Medicine

## 2017-06-25 ENCOUNTER — Other Ambulatory Visit (INDEPENDENT_AMBULATORY_CARE_PROVIDER_SITE_OTHER): Payer: BC Managed Care – PPO

## 2017-06-25 VITALS — BP 112/84 | HR 71 | Temp 98.5°F | Resp 16 | Ht 66.0 in | Wt 293.0 lb

## 2017-06-25 DIAGNOSIS — Z0000 Encounter for general adult medical examination without abnormal findings: Secondary | ICD-10-CM | POA: Diagnosis not present

## 2017-06-25 DIAGNOSIS — I10 Essential (primary) hypertension: Secondary | ICD-10-CM

## 2017-06-25 DIAGNOSIS — R635 Abnormal weight gain: Secondary | ICD-10-CM

## 2017-06-25 DIAGNOSIS — R7303 Prediabetes: Secondary | ICD-10-CM

## 2017-06-25 DIAGNOSIS — Z9884 Bariatric surgery status: Secondary | ICD-10-CM | POA: Insufficient documentation

## 2017-06-25 DIAGNOSIS — Z6841 Body Mass Index (BMI) 40.0 and over, adult: Secondary | ICD-10-CM

## 2017-06-25 DIAGNOSIS — L74510 Primary focal hyperhidrosis, axilla: Secondary | ICD-10-CM | POA: Diagnosis not present

## 2017-06-25 LAB — COMPREHENSIVE METABOLIC PANEL
ALT: 13 U/L (ref 0–35)
AST: 19 U/L (ref 0–37)
Albumin: 3.6 g/dL (ref 3.5–5.2)
Alkaline Phosphatase: 76 U/L (ref 39–117)
BUN: 18 mg/dL (ref 6–23)
CO2: 29 mEq/L (ref 19–32)
Calcium: 9.1 mg/dL (ref 8.4–10.5)
Chloride: 100 mEq/L (ref 96–112)
Creatinine, Ser: 0.91 mg/dL (ref 0.40–1.20)
GFR: 68.4 mL/min (ref >60.00)
Glucose, Bld: 98 mg/dL (ref 70–99)
Potassium: 3.3 mEq/L — ABNORMAL LOW (ref 3.5–5.1)
Sodium: 136 mEq/L (ref 135–145)
Total Bilirubin: 0.3 mg/dL (ref 0.2–1.2)
Total Protein: 7.8 g/dL (ref 6.0–8.3)

## 2017-06-25 LAB — LIPID PANEL
Cholesterol: 123 mg/dL (ref 0–200)
HDL: 53.2 mg/dL (ref >39.00)
LDL Cholesterol: 55 mg/dL (ref 0–99)
NonHDL: 69.93
Total CHOL/HDL Ratio: 2
Triglycerides: 75 mg/dL (ref 0.0–149.0)
VLDL: 15 mg/dL (ref 0.0–40.0)

## 2017-06-25 LAB — CBC WITH DIFFERENTIAL/PLATELET
Basophils Absolute: 0.1 10*3/uL (ref 0.0–0.1)
Basophils Relative: 1.3 % (ref 0.0–3.0)
Eosinophils Absolute: 0.2 10*3/uL (ref 0.0–0.7)
Eosinophils Relative: 2.7 % (ref 0.0–5.0)
HCT: 37.2 % (ref 36.0–46.0)
Hemoglobin: 12.1 g/dL (ref 12.0–15.0)
Lymphocytes Relative: 31.2 % (ref 12.0–46.0)
Lymphs Abs: 1.9 10*3/uL (ref 0.7–4.0)
MCHC: 32.5 g/dL (ref 30.0–36.0)
MCV: 79.2 fl (ref 78.0–100.0)
Monocytes Absolute: 0.5 10*3/uL (ref 0.1–1.0)
Monocytes Relative: 8.9 % (ref 3.0–12.0)
Neutro Abs: 3.3 10*3/uL (ref 1.4–7.7)
Neutrophils Relative %: 55.9 % (ref 43.0–77.0)
Platelets: 262 10*3/uL (ref 150.0–400.0)
RBC: 4.7 Mil/uL (ref 3.87–5.11)
RDW: 16.8 % — ABNORMAL HIGH (ref 11.5–15.5)
WBC: 6 10*3/uL (ref 4.0–10.5)

## 2017-06-25 LAB — TSH: TSH: 1.24 u[IU]/mL (ref 0.35–4.50)

## 2017-06-25 LAB — HEMOGLOBIN A1C: Hgb A1c MFr Bld: 6.3 % (ref 4.6–6.5)

## 2017-06-25 MED ORDER — ALUMINUM CHLORIDE 20 % EX SOLN: Freq: Every day | CUTANEOUS | 3 refills | Status: DC

## 2017-06-25 NOTE — Assessment & Plan Note (Signed)
Bilateral Using over-the-counter prescription strength deodorant-not effective Will try Drysol if covered

## 2017-06-25 NOTE — Assessment & Plan Note (Signed)
Has been gaining weight  S/p gastric bypass

## 2017-06-25 NOTE — Assessment & Plan Note (Signed)
Status post gastric bypass 2005 Has not been able to lose weight and weight is slowly increasing May not exercising-stressed the importance of regular exercise Often it sounds she is not eating enough and likely not eating the right foods-she is trying to change her diet Will refer to the medical weight management clinic

## 2017-06-25 NOTE — Assessment & Plan Note (Signed)
Check A1c Stressed low sugar/carbohydrate diet Stressed the importance of regular exercise

## 2017-06-25 NOTE — Assessment & Plan Note (Signed)
BP well controlled Current regimen effective and well tolerated Continue current medications at current doses cmp  

## 2017-06-26 ENCOUNTER — Encounter: Payer: Self-pay | Admitting: Internal Medicine

## 2017-06-27 ENCOUNTER — Telehealth: Payer: Self-pay | Admitting: Emergency Medicine

## 2017-06-27 MED ORDER — ALUMINUM CHLORIDE 20 % EX SOLN: Freq: Every day | CUTANEOUS | 3 refills | Status: DC

## 2017-06-27 NOTE — Telephone Encounter (Signed)
Needs to go to another pharmacy

## 2017-06-27 NOTE — Telephone Encounter (Signed)
Spoke with pt, resent RX to [facility]

## 2017-06-27 NOTE — Telephone Encounter (Signed)
Drysol is on back order at Pharmacy is there an alternative or does this need to be called into another pharmacy?

## 2017-06-29 ENCOUNTER — Other Ambulatory Visit: Payer: Self-pay | Admitting: Obstetrics and Gynecology

## 2017-06-29 ENCOUNTER — Ambulatory Visit
Admission: RE | Admit: 2017-06-29 | Discharge: 2017-06-29 | Disposition: A | Discharge status: Home / Self Care | Payer: BC Managed Care – PPO | Source: Ambulatory Visit | Attending: Obstetrics and Gynecology | Admitting: Obstetrics and Gynecology

## 2017-06-29 DIAGNOSIS — Z1231 Encounter for screening mammogram for malignant neoplasm of breast: Secondary | ICD-10-CM

## 2017-07-23 ENCOUNTER — Encounter: Payer: BC Managed Care – PPO | Admitting: Internal Medicine

## 2017-08-28 ENCOUNTER — Other Ambulatory Visit (INDEPENDENT_AMBULATORY_CARE_PROVIDER_SITE_OTHER): Payer: BC Managed Care – PPO

## 2017-08-28 ENCOUNTER — Encounter: Payer: Self-pay | Admitting: Internal Medicine

## 2017-08-28 ENCOUNTER — Ambulatory Visit: Payer: BC Managed Care – PPO | Admitting: Internal Medicine

## 2017-08-28 VITALS — BP 110/70 | HR 78 | Temp 98.5°F | Ht 66.0 in | Wt 300.0 lb

## 2017-08-28 DIAGNOSIS — M62838 Other muscle spasm: Secondary | ICD-10-CM | POA: Insufficient documentation

## 2017-08-28 LAB — COMPREHENSIVE METABOLIC PANEL
ALT: 11 U/L (ref 0–35)
AST: 19 U/L (ref 0–37)
Albumin: 3.5 g/dL (ref 3.5–5.2)
Alkaline Phosphatase: 80 U/L (ref 39–117)
BUN: 21 mg/dL (ref 6–23)
CO2: 30 mEq/L (ref 19–32)
Calcium: 9.6 mg/dL (ref 8.4–10.5)
Chloride: 101 mEq/L (ref 96–112)
Creatinine, Ser: 1 mg/dL (ref 0.40–1.20)
GFR: 61.31 mL/min (ref >60.00)
Glucose, Bld: 92 mg/dL (ref 70–99)
Potassium: 3.4 mEq/L — ABNORMAL LOW (ref 3.5–5.1)
Sodium: 136 mEq/L (ref 135–145)
Total Bilirubin: 0.4 mg/dL (ref 0.2–1.2)
Total Protein: 7.9 g/dL (ref 6.0–8.3)

## 2017-08-28 LAB — CK: Total CK: 158 U/L (ref 7–177)

## 2017-08-28 LAB — MAGNESIUM: Magnesium: 1.8 mg/dL (ref 1.5–2.5)

## 2017-08-28 MED ORDER — CYCLOBENZAPRINE HCL 5 MG PO TABS
5.0000 mg | ORAL_TABLET | Freq: Every day | ORAL | 0 refills | Status: DC
Start: 2017-08-28 — End: 2017-10-08

## 2017-08-28 NOTE — Patient Instructions (Addendum)
We will have you take meloxicam daily for the next 1-2 weeks to help.   Try icing the knee to help with pain.  We have sent in the muscle relaxer to use in the evening if needed.  This should get better in 1-2 weeks and could be related to a baker's cyst.   We are checking the labs today to rule out a potassium being low to cause this.    Baker Cyst A Baker cyst, also called a popliteal cyst, is a sac-like growth that forms at the back of the knee. The cyst forms when the fluid-filled sac (bursa) that cushions the knee joint becomes enlarged. The bursa that becomes a Baker cyst is located at the back of the knee joint. What are the causes? In most cases, a Baker cyst results from another knee problem that causes swelling inside the knee. This makes the fluid inside the knee joint (synovial fluid) flow into the bursa behind the knee, causing the bursa to enlarge. What increases the risk? You may be more likely to develop a Baker cyst if you already have a knee problem, such as:  A tear in cartilage that cushions the knee joint (meniscal tear).  A tear in the tissues that connect the bones of the knee joint (ligament tear).  Knee swelling from osteoarthritis, rheumatoid arthritis, or gout.  What are the signs or symptoms? A Baker cyst does not always cause symptoms. A lump behind the knee may be the only sign of the condition. The lump may be painful, especially when the knee is straightened. If the lump is painful, the pain may come and go. The knee may also be stiff. Symptoms may quickly get more severe if the cyst breaks open (ruptures). If your cyst ruptures, signs and symptoms may affect the knee and the back of the lower leg (calf) and may include:  Sudden or worsening pain.  Swelling.  Bruising.  How is this diagnosed? This condition may be diagnosed based on your symptoms and medical history. Your health care provider will also do a physical exam. This may include:  Feeling  the cyst to check whether it is tender.  Checking your knee for signs of another knee condition that causes swelling.  You may have imaging tests, such as:  X-rays.  MRI.  Ultrasound.  How is this treated? A Baker cyst that is not painful may go away without treatment. If the cyst gets large or painful, it will likely get better if the underlying knee problem is treated. Treatment for a Baker cyst may include:  Resting.  Keeping weight off of the knee. This means not leaning on the knee to support your body weight.  NSAIDs to reduce pain and swelling.  A procedure to drain the fluid from the cyst with a needle (aspiration). You may also get an injection of a medicine that reduces swelling (steroid).  Surgery. This may be needed if other treatments do not work. This usually involves correcting knee damage and removing the cyst.  Follow these instructions at home:  Take over-the-counter and prescription medicines only as told by your health care provider.  Rest and return to your normal activities as told by your health care provider. Avoid activities that make pain or swelling worse. Ask your health care provider what activities are safe for you.  Keep all follow-up visits as told by your health care provider. This is important. Contact a health care provider if:  You have knee pain, stiffness,  or swelling that does not get better. Get help right away if:  You have sudden or worsening pain and swelling in your calf area. This information is not intended to replace advice given to you by your health care provider. Make sure you discuss any questions you have with your health care provider. Document Released: 12/19/2004 Document Revised: 09/09/2015 Document Reviewed: 09/09/2015 Elsevier Interactive Patient Education  2018 [employer].

## 2017-08-28 NOTE — Progress Notes (Signed)
   Subjective:    Patient ID: Roberta [name], female    DOB: [birth year], 52 y.o.   MRN: [id number]  HPI The patient is a 52 YO female coming in for muscle spasms in left leg. Started Sunday. Is having some weakness of the leg with it giving out after being in spasm. Pain in the posterior knee and now in the calf. Denies injury or trauma. Overall it is worsening to stable since onset. Has tried meloxicam which helped for awhile but the symptoms came back. Hx gastric bypass last labs for vitamins in Jan 2019. Denies change in medicine recently. She is having to limp around school and having problems with stairs. Prior knee surgery on the left knee which did not feel similar to this current problem. She has never had symptoms like this before. Recent labs with low potassium and taking stable dosing losartan/hctz.  Review of Systems  Constitutional: Positive for activity change. Negative for appetite change, chills, fatigue, fever and unexpected weight change.  Respiratory: Negative.   Cardiovascular: Negative.   Gastrointestinal: Negative.   Musculoskeletal: Positive for arthralgias, gait problem and myalgias. Negative for back pain and joint swelling.  Skin: Negative.       Objective:   Physical Exam  Constitutional: She is oriented to person, place, and time. She appears well-developed and well-nourished.  overweight  HENT:  Head: Normocephalic and atraumatic.  Eyes: EOM are normal.  Neck: Normal range of motion.  Cardiovascular: Normal rate and regular rhythm.  Pulmonary/Chest: Effort normal and breath sounds normal. No respiratory distress. She has no wheezes. She has no rales.  Musculoskeletal: She exhibits tenderness. She exhibits no edema.  No left knee effusion, ACL and PCL intact left knee, no pain with palpation of LCL or MCL. Some pain posteriorly although no swelling. Calf without swelling or tenderness to palpation although sore.   Neurological: She is alert and oriented to  person, place, and time. Coordination normal.  Skin: Skin is warm and dry.   Vitals:   08/28/17 1554  BP: 110/70  Pulse: 78  Temp: 98.5 F (36.9 C)  TempSrc: Oral  SpO2: 96%  Weight: 300 lb (136.1 kg)  Height: 5\' 6"  (1.676 m)      Assessment & Plan:

## 2017-08-28 NOTE — Assessment & Plan Note (Signed)
Sounds like this could be baker's cyst rupture. No tendon problems on exam and no pain on exam or effusion in the knee. Checking CK, CMP, magnesium for deficiencies given that she had low potassium previously and is taking losartan/hctz. Rx for flexeril and advised to take meloxicam daily which she has at home. If no improvement in 1-2 weeks needs further evaluation and consider ultrasound knee.

## 2017-10-04 ENCOUNTER — Encounter (INDEPENDENT_AMBULATORY_CARE_PROVIDER_SITE_OTHER): Payer: BC Managed Care – PPO

## 2017-10-08 ENCOUNTER — Ambulatory Visit (INDEPENDENT_AMBULATORY_CARE_PROVIDER_SITE_OTHER): Payer: BC Managed Care – PPO | Admitting: Family Medicine

## 2017-10-08 ENCOUNTER — Encounter (INDEPENDENT_AMBULATORY_CARE_PROVIDER_SITE_OTHER): Payer: Self-pay | Admitting: Family Medicine

## 2017-10-08 VITALS — BP 109/66 | HR 66 | Temp 98.1°F | Ht 66.0 in | Wt 292.0 lb

## 2017-10-08 DIAGNOSIS — Z9189 Other specified personal risk factors, not elsewhere classified: Secondary | ICD-10-CM | POA: Diagnosis not present

## 2017-10-08 DIAGNOSIS — R5383 Other fatigue: Secondary | ICD-10-CM | POA: Diagnosis not present

## 2017-10-08 DIAGNOSIS — R7303 Prediabetes: Secondary | ICD-10-CM

## 2017-10-08 DIAGNOSIS — Z6841 Body Mass Index (BMI) 40.0 and over, adult: Secondary | ICD-10-CM

## 2017-10-08 DIAGNOSIS — R0602 Shortness of breath: Secondary | ICD-10-CM

## 2017-10-08 DIAGNOSIS — Z1331 Encounter for screening for depression: Secondary | ICD-10-CM

## 2017-10-08 DIAGNOSIS — Z0289 Encounter for other administrative examinations: Secondary | ICD-10-CM

## 2017-10-08 DIAGNOSIS — I10 Essential (primary) hypertension: Secondary | ICD-10-CM

## 2017-10-09 LAB — COMPREHENSIVE METABOLIC PANEL
ALT: 9 IU/L (ref 0–32)
AST: 17 IU/L (ref 0–40)
Albumin/Globulin Ratio: 0.9 — ABNORMAL LOW (ref 1.2–2.2)
Albumin: 3.5 g/dL (ref 3.5–5.5)
Alkaline Phosphatase: 88 IU/L (ref 39–117)
BUN/Creatinine Ratio: 13 (ref 9–23)
BUN: 13 mg/dL (ref 6–24)
Bilirubin Total: 0.4 mg/dL (ref 0.0–1.2)
CO2: 26 mmol/L (ref 20–29)
Calcium: 9.1 mg/dL (ref 8.7–10.2)
Chloride: 97 mmol/L (ref 96–106)
Creatinine, Ser: 0.97 mg/dL (ref 0.57–1.00)
GFR calc Af Amer: 77 mL/min/{1.73_m2} (ref >59)
GFR calc non Af Amer: 66 mL/min/{1.73_m2} (ref >59)
Globulin, Total: 3.7 g/dL (ref 1.5–4.5)
Glucose: 88 mg/dL (ref 65–99)
Potassium: 3.8 mmol/L (ref 3.5–5.2)
Sodium: 139 mmol/L (ref 134–144)
Total Protein: 7.2 g/dL (ref 6.0–8.5)

## 2017-10-09 LAB — CBC WITH DIFFERENTIAL
Basophils Absolute: 0 10*3/uL (ref 0.0–0.2)
Basos: 1 %
EOS (ABSOLUTE): 0.2 10*3/uL (ref 0.0–0.4)
Eos: 3 %
Hematocrit: 37.2 % (ref 34.0–46.6)
Hemoglobin: 12.3 g/dL (ref 11.1–15.9)
Immature Grans (Abs): 0 10*3/uL (ref 0.0–0.1)
Immature Granulocytes: 0 %
Lymphocytes Absolute: 1.6 10*3/uL (ref 0.7–3.1)
Lymphs: 23 %
MCH: 26.4 pg — ABNORMAL LOW (ref 26.6–33.0)
MCHC: 33.1 g/dL (ref 31.5–35.7)
MCV: 80 fL (ref 79–97)
Monocytes Absolute: 0.6 10*3/uL (ref 0.1–0.9)
Monocytes: 9 %
Neutrophils Absolute: 4.5 10*3/uL (ref 1.4–7.0)
Neutrophils: 64 %
RBC: 4.66 x10E6/uL (ref 3.77–5.28)
RDW: 15.1 % (ref 12.3–15.4)
WBC: 6.9 10*3/uL (ref 3.4–10.8)

## 2017-10-09 LAB — LIPID PANEL WITH LDL/HDL RATIO
Cholesterol, Total: 112 mg/dL (ref 100–199)
HDL: 50 mg/dL (ref >39)
LDL Calculated: 48 mg/dL (ref 0–99)
LDl/HDL Ratio: 1 ratio (ref 0.0–3.2)
Triglycerides: 69 mg/dL (ref 0–149)
VLDL Cholesterol Cal: 14 mg/dL (ref 5–40)

## 2017-10-09 LAB — HEMOGLOBIN A1C
Est. average glucose Bld gHb Est-mCnc: 126 mg/dL
Hgb A1c MFr Bld: 6 % — ABNORMAL HIGH (ref 4.8–5.6)

## 2017-10-09 LAB — FOLATE: Folate: >20 ng/mL (ref >3.0)

## 2017-10-09 LAB — T4, FREE: Free T4: 1.15 ng/dL (ref 0.82–1.77)

## 2017-10-09 LAB — T3: T3, Total: 119 ng/dL (ref 71–180)

## 2017-10-09 LAB — VITAMIN B12: Vitamin B-12: 615 pg/mL (ref 232–1245)

## 2017-10-09 LAB — VITAMIN D 25 HYDROXY (VIT D DEFICIENCY, FRACTURES): Vit D, 25-Hydroxy: 47.7 ng/mL (ref 30.0–100.0)

## 2017-10-09 LAB — INSULIN, RANDOM: INSULIN: 10.6 u[IU]/mL (ref 2.6–24.9)

## 2017-10-09 LAB — TSH: TSH: 1.48 u[IU]/mL (ref 0.450–4.500)

## 2017-10-09 NOTE — Progress Notes (Signed)
Office: [phone]  /  Fax: [phone]   Dear Dr. Quay [name],   Thank you for referring Roberta [name] to our clinic. The following note includes my evaluation and treatment recommendations.  HPI:   Chief Complaint: OBESITY    Roberta [name] has been referred by Roberta Rail, MD for consultation regarding her obesity and obesity related comorbidities.    Roberta [name] (MR# [id number]) is a 53 y.o. female who presents on 10/08/2017 for obesity evaluation and treatment. Current BMI is Body mass index is 47.13 kg/m.Marland Kitchen Roberta [name] has been struggling with her weight for many years and has been unsuccessful in either losing weight, maintaining weight loss, or reaching her healthy weight goal.     Roberta [name] is status post gastric bypass surgery in 2005.     Roberta [name] attended our information session and states she is currently in the action stage of change and ready to dedicate time achieving and maintaining a healthier weight. Roberta [name] is interested in becoming our Roberta [name] and working on intensive lifestyle modifications including (but not limited to) diet, exercise and weight loss.    Roberta [name] states her desired weight loss is 132 lbs she has been heavy most of  her life she started gaining weight (always been overweight) her heaviest weight ever was 350 lbs she has significant food cravings issues  she snacks frequently in the evenings she wakes up frquently in the middle of the night to eat she skips meals frequently she is frequently drinking liquids with calories   Fatigue Roberta [name] feels her energy is lower than it should be. This has worsened with weight gain and has not worsened recently. Roberta [name] admits to daytime somnolence and  admits to waking up still tired. Roberta [name] is at risk for obstructive sleep apnea. Patent has a history of symptoms of daytime fatigue. Roberta [name] generally gets 7 hours of sleep per night, and states they generally have generally restful sleep. Snoring is not present. Apneic episodes  are not present. Epworth Sleepiness Score is 3.  Dyspnea on exertion Roberta [name] notes increasing shortness of breath with exercising and seems to be worsening over time with weight gain. She notes getting out of breath sooner with activity than she used to. This has not gotten worse recently. EKG-normal sinus rhythm. Roberta [name] denies orthopnea.  Hypertension Roberta [name] is a 52 y.o. female with hypertension, diagnosed for 11 years. Roberta [name] is on Losartan-hydrochlorothiazide and denies chest pain. She is working weight loss to help control her blood pressure with the goal of decreasing her risk of heart attack and stroke. Bret's blood pressure is currently controlled.  Pre-Diabetes Roberta [name] has a diagnosis of pre-diabetes based on her elevated Hgb A1c and was informed this puts her at greater risk of developing diabetes. She has had diagnosis for the past 2 years. Her last Hgb A1c was of 6.3 and she is not taking metformin currently and continues to work on diet and exercise to decrease risk of diabetes. She denies nausea or hypoglycemia.  At risk for diabetes Roberta [name] is at higher than average risk for developing diabetes due to her obesity and pre-diabetes. She currently denies polyuria or polydipsia.  Depression Screen Roberta [name]'s Food and Mood (modified PHQ-9) score was  Depression screen PHQ 2/9 10/08/2017  Decreased Interest 1  Down, Depressed, Hopeless 1  PHQ - 2 Score 2  Altered sleeping 1  Tired, decreased energy 1  Change in appetite 0  Feeling bad or failure about yourself  0  Trouble concentrating 0  Moving  slowly or fidgety/restless 0  Suicidal thoughts 0  PHQ-9 Score 4  Difficult doing work/chores Not difficult at all    ALLERGIES: Allergies  Allergen Reactions  . Lisinopril Swelling    Swelling of the tongue  . Penicillins     Pt told as a child-unknown reaction.  . Tetanus Toxoids Swelling    Swelling at site of injection  . Norvasc [Amlodipine Besylate] Rash     MEDICATIONS: Current Outpatient Medications on File Prior to Visit  Medication Sig Dispense Refill  . Black Cohosh 40 MG CAPS Take 1 capsule by mouth daily.    Marland Kitchen losartan-hydrochlorothiazide (HYZAAR) 100-25 MG tablet TAKE 1 TABLET BY MOUTH EVERY DAY 90 tablet 1  . Multiple Vitamin (MULTIVITAMIN) tablet Take 1 tablet by mouth daily.    . norethindrone (MICRONOR,CAMILA,ERRIN) 0.35 MG tablet Take 1 tablet by mouth daily.     No current facility-administered medications on file prior to visit.     PAST MEDICAL HISTORY: Past Medical History:  Diagnosis Date  . Back pain   . Hip pain   . Hypertension   . Obesity   . Prediabetes   . Swelling    bilat LE    PAST SURGICAL HISTORY: Past Surgical History:  Procedure Laterality Date  . CHOLECYSTECTOMY    . GASTRIC BYPASS    . KNEE SURGERY  2002   Ligament repair    SOCIAL HISTORY: Social History   Tobacco Use  . Smoking status: Never Smoker  . Smokeless tobacco: Never Used  Substance Use Topics  . Alcohol use: No  . Drug use: No    FAMILY HISTORY: Family History  Problem Relation Age of Onset  . Diabetes Mother   . Hypertension Mother   . Heart disease Mother   . Stroke Mother   . Kidney disease Mother   . Obesity Mother   . Cancer Maternal Aunt        lung  . Cancer Maternal Uncle        lung    ROS: Review of Systems  Constitutional: Positive for malaise/fatigue. Negative for weight loss.       + Trouble sleeping  Eyes:       + Wear glasses or contacts  Respiratory: Positive for shortness of breath (with exertion).   Cardiovascular: Negative for chest pain and orthopnea.  Gastrointestinal: Negative for nausea.  Genitourinary: Negative for frequency.  Musculoskeletal: Positive for back pain.       + Muscle or joint pain  Skin:       + Dryness + Hair or nail changes  Endo/Heme/Allergies: Negative for polydipsia.       Negative hypoglycemia    PHYSICAL EXAM: Blood pressure 109/66, pulse 66,  temperature 98.1 F (36.7 C), temperature source Oral, height 5\' 6"  (1.676 m), weight 292 lb (132.5 kg), SpO2 98 %. Body mass index is 47.13 kg/m. Physical Exam  Constitutional: She is oriented to person, place, and time. She appears well-developed and well-nourished.  HENT:  Head: Normocephalic and atraumatic.  Nose: Nose normal.  Eyes: EOM are normal. No scleral icterus.  Neck: Normal range of motion. Neck supple. Thyromegaly present.  Cardiovascular: Normal rate and regular rhythm.  Pulmonary/Chest: Effort normal. No respiratory distress.  Abdominal: Soft. There is no tenderness.  + Obesity  Musculoskeletal:  Range of Motion normal in all 4 extremities Trace edema noted in bilateral lower extremities  Neurological: She is alert and oriented to person, place, and time. Coordination normal.  Skin: Skin  is warm and dry.  Psychiatric: She has a normal mood and affect. Her behavior is normal.  Vitals reviewed.   RECENT LABS AND TESTS: BMET    Component Value Date/Time   NA 139 10/08/2017 1239   K 3.8 10/08/2017 1239   CL 97 10/08/2017 1239   CO2 26 10/08/2017 1239   GLUCOSE 88 10/08/2017 1239   GLUCOSE 92 08/28/2017 1623   BUN 13 10/08/2017 1239   CREATININE 0.97 10/08/2017 1239   CALCIUM 9.1 10/08/2017 1239   GFRNONAA 66 10/08/2017 1239   GFRAA 77 10/08/2017 1239   Lab Results  Component Value Date   HGBA1C 6.0 (H) 10/08/2017   Lab Results  Component Value Date   INSULIN 10.6 10/08/2017   CBC    Component Value Date/Time   WBC 6.9 10/08/2017 1239   WBC 6.0 06/25/2017 1541   RBC 4.66 10/08/2017 1239   RBC 4.70 06/25/2017 1541   HGB 12.3 10/08/2017 1239   HCT 37.2 10/08/2017 1239   PLT 262.0 06/25/2017 1541   MCV 80 10/08/2017 1239   MCH 26.4 (L) 10/08/2017 1239   MCHC 33.1 10/08/2017 1239   MCHC 32.5 06/25/2017 1541   RDW 15.1 10/08/2017 1239   LYMPHSABS 1.6 10/08/2017 1239   MONOABS 0.5 06/25/2017 1541   EOSABS 0.2 10/08/2017 1239   BASOSABS 0.0  10/08/2017 1239   Iron/TIBC/Ferritin/ %Sat    Component Value Date/Time   IRON 162 (H) 01/22/2017 1152   FERRITIN 14.5 01/22/2017 1152   Lipid Panel     Component Value Date/Time   CHOL 112 10/08/2017 1239   TRIG 69 10/08/2017 1239   HDL 50 10/08/2017 1239   CHOLHDL 2 06/25/2017 1541   VLDL 15.0 06/25/2017 1541   LDLCALC 48 10/08/2017 1239   Hepatic Function Panel     Component Value Date/Time   PROT 7.2 10/08/2017 1239   ALBUMIN 3.5 10/08/2017 1239   AST 17 10/08/2017 1239   ALT 9 10/08/2017 1239   ALKPHOS 88 10/08/2017 1239   BILITOT 0.4 10/08/2017 1239      Component Value Date/Time   TSH 1.480 10/08/2017 1239   TSH 1.24 06/25/2017 1541   TSH 1.53 07/11/2016 1150    ECG  shows NSR with a rate of 74 BPM INDIRECT CALORIMETER done today shows a VO2 of 202 and a REE of 1404.  Her calculated basal metabolic rate is 5102 thus her basal metabolic rate is worse than expected.    ASSESSMENT AND PLAN: Other fatigue - Plan: EKG 12-Lead, Vitamin B12, CBC With Differential, Folate, Lipid Panel With LDL/HDL Ratio, T3, T4, free, TSH, VITAMIN D 25 Hydroxy (Vit-D Deficiency, Fractures)  Shortness of breath on exertion  Essential hypertension  Prediabetes - Plan: Hemoglobin A1c, Insulin, random, Comprehensive metabolic panel  At risk for diabetes mellitus  Depression screening  Class 3 severe obesity with serious comorbidity and body mass index (BMI) of 45.0 to 49.9 in adult, unspecified obesity type ([place])  PLAN:  Fatigue Roberta [name] was informed that her fatigue may be related to obesity, depression or many other causes. Labs will be ordered, and in the meanwhile Jaelah has agreed to work on diet, exercise and weight loss to help with fatigue. Proper sleep hygiene was discussed including the need for 7-8 hours of quality sleep each night. A sleep study was not ordered based on symptoms and Epworth score.  Dyspnea on exertion Roberta [name]'s shortness of breath appears to be obesity  related and exercise induced. She has agreed to work  on weight loss and gradually increase exercise to treat her exercise induced shortness of breath. If Luverta follows our instructions and loses weight without improvement of her shortness of breath, we will plan to refer to pulmonology. We will monitor this condition regularly. Roberta [name] agrees to this plan.  Hypertension We discussed sodium restriction, working on healthy weight loss, and a regular exercise program as the means to achieve improved blood pressure control. Taelyn agreed with this plan and agreed to follow up as directed. We will continue to monitor her blood pressure as well as her progress with the above lifestyle modifications. Roberta [name] agrees to continue her medications as prescribed and will watch for signs of hypotension as she continues her lifestyle modifications. We will check labs and Roberta [name] agrees to follow up with our clinic in 2 weeks.  Pre-Diabetes Roberta [name] will continue to work on weight loss, exercise, and decreasing simple carbohydrates in her diet to help decrease the risk of diabetes. We dicussed metformin including benefits and risks. She was informed that eating too many simple carbohydrates or too many calories at one sitting increases the likelihood of GI side effects. Roberta [name] declined metformin for now and a prescription was not written today. We will check labs and Roberta [name] agrees to follow up with our clinic in 2 weeks as directed to monitor her progress.  Diabetes risk counselling Roberta [name] was given extended (15 minutes) diabetes prevention counseling today. She is 52 y.o. female and has risk factors for diabetes including obesity and pre-diabetes. We discussed intensive lifestyle modifications today with an emphasis on weight loss as well as increasing exercise and decreasing simple carbohydrates in her diet.  Depression Screen Roberta [name] had a negative depression screening. Depression is commonly associated with obesity and often  results in emotional eating behaviors. We will monitor this closely and work on CBT to help improve the non-hunger eating patterns. Referral to Psychology may be required if no improvement is seen as she continues in our clinic.  Obesity Roberta [name] is currently in the action stage of change and her goal is to continue with weight loss efforts. I recommend Roberta [name] begin the structured treatment plan as follows:  She has agreed to follow the Category 2 plan Roberta [name] has been instructed to eventually work up to a goal of 150 minutes of combined cardio and strengthening exercise per week for weight loss and overall health benefits. We discussed the following Behavioral Modification Strategies today: increasing lean protein intake, increasing vegetables, work on meal planning and easy cooking plans, better snacking choices, and planning for success   She was informed of the importance of frequent follow up visits to maximize her success with intensive lifestyle modifications for her multiple health conditions. She was informed we would discuss her lab results at her next visit unless there is a critical issue that needs to be addressed sooner. [name] agreed to keep her next visit at the agreed upon time to discuss these results.    OBESITY BEHAVIORAL INTERVENTION VISIT  Today's visit was # 1   Starting weight: 292 lbs Starting date: 10/08/17 Today's weight : 292 lbs  Today's date: 10/08/2017 Total lbs lost to date: 0    ASK: We discussed the diagnosis of obesity with Roberta [name] today and Jermia agreed to give [place] permission to discuss obesity behavioral modification therapy today.  ASSESS: Jannifer has the diagnosis of obesity and her BMI today is 47.15 Mckenzey is in the action stage of change   ADVISE: Patryce was educated on the  multiple health risks of obesity as well as the benefit of weight loss to improve her health. She was advised of the need for long term treatment and the importance of lifestyle  modifications to improve her current health and to decrease her risk of future health problems.  AGREE: Multiple dietary modification options and treatment options were discussed and  Pachia agreed to follow the recommendations documented in the above note.  ARRANGE: Mayling was educated on the importance of frequent visits to treat obesity as outlined per CMS and USPSTF guidelines and agreed to schedule her next follow up appointment today.  I, Trixie Dredge, am acting as transcriptionist for Ilene Qua, MD  I have reviewed the above documentation for accuracy and completeness, and I agree with the above. - Ilene Qua, MD

## 2017-10-22 ENCOUNTER — Ambulatory Visit (INDEPENDENT_AMBULATORY_CARE_PROVIDER_SITE_OTHER): Payer: BC Managed Care – PPO | Admitting: Family Medicine

## 2017-10-22 VITALS — BP 121/83 | HR 79 | Temp 98.2°F | Ht 66.0 in | Wt 288.0 lb

## 2017-10-22 DIAGNOSIS — R7303 Prediabetes: Secondary | ICD-10-CM | POA: Diagnosis not present

## 2017-10-22 DIAGNOSIS — Z6841 Body Mass Index (BMI) 40.0 and over, adult: Secondary | ICD-10-CM

## 2017-10-22 DIAGNOSIS — Z9189 Other specified personal risk factors, not elsewhere classified: Secondary | ICD-10-CM | POA: Diagnosis not present

## 2017-10-22 DIAGNOSIS — E559 Vitamin D deficiency, unspecified: Secondary | ICD-10-CM

## 2017-10-22 MED ORDER — METFORMIN HCL 500 MG PO TABS: 500.0000 mg | ORAL_TABLET | Freq: Every day | ORAL | 0 refills | Status: DC

## 2017-10-22 MED ORDER — VITAMIN D-3 125 MCG (5000 UT) PO TABS: 1.0000 | ORAL_TABLET | Freq: Every day | ORAL | Status: DC

## 2017-10-24 NOTE — Progress Notes (Signed)
Office: [phone]  /  Fax: [phone]   HPI:   Chief Complaint: OBESITY Roberta [name] is here to discuss her progress with her obesity treatment plan. She is on the Category 2 plan and is following her eating plan approximately 100 % of the time. She states she is exercising 0 minutes 0 times per week. Roberta [name] voices that she had to change up the meal plan to make it less boring. She has been breaking up meals to satiate using snack calories for coffee creamer. She notes occasional hunger before dinner.  Her weight is 288 lb (130.6 kg) today and has had a weight loss of 4 pounds over a period of 2 weeks since her last visit. She has lost 4 lbs since starting treatment with [place].  Vitamin D Deficiency Roberta [name] has a diagnosis of vitamin D deficiency. She is not currently taking OTC Vit D. She notes fatigue and denies nausea, vomiting or muscle weakness.  Pre-Diabetes Roberta [name] has a diagnosis of pre-diabetes based on her elevated Hgb A1c and was informed this puts her at greater risk of developing diabetes. Her Hgb A1c has been elevated for at least 2 years. She has a significant history of diabetes. She is not taking metformin currently and continues to work on diet and exercise to decrease risk of diabetes. She denies nausea or hypoglycemia.  At risk for diabetes Roberta [name] is at higher than average risk for developing diabetes due to her obesity and pre-diabetes. She currently denies polyuria or polydipsia.  ALLERGIES: Allergies  Allergen Reactions  . Lisinopril Swelling    Swelling of the tongue  . Penicillins     Pt told as a child-unknown reaction.  . Tetanus Toxoids Swelling    Swelling at site of injection  . Norvasc [Amlodipine Besylate] Rash    MEDICATIONS: Current Outpatient Medications on File Prior to Visit  Medication Sig Dispense Refill  . Black Cohosh 40 MG CAPS Take 1 capsule by mouth daily.    Marland Kitchen losartan-hydrochlorothiazide (HYZAAR) 100-25 MG tablet TAKE 1 TABLET BY MOUTH EVERY  DAY 90 tablet 1  . Multiple Vitamin (MULTIVITAMIN) tablet Take 1 tablet by mouth daily.    . norethindrone (MICRONOR,CAMILA,ERRIN) 0.35 MG tablet Take 1 tablet by mouth daily.     No current facility-administered medications on file prior to visit.     PAST MEDICAL HISTORY: Past Medical History:  Diagnosis Date  . Back pain   . Hip pain   . Hypertension   . Obesity   . Prediabetes   . Swelling    bilat LE    PAST SURGICAL HISTORY: Past Surgical History:  Procedure Laterality Date  . CHOLECYSTECTOMY    . GASTRIC BYPASS    . KNEE SURGERY  2002   Ligament repair    SOCIAL HISTORY: Social History   Tobacco Use  . Smoking status: Never Smoker  . Smokeless tobacco: Never Used  Substance Use Topics  . Alcohol use: No  . Drug use: No    FAMILY HISTORY: Family History  Problem Relation Age of Onset  . Diabetes Mother   . Hypertension Mother   . Heart disease Mother   . Stroke Mother   . Kidney disease Mother   . Obesity Mother   . Cancer Maternal Aunt        lung  . Cancer Maternal Uncle        lung    ROS: Review of Systems  Constitutional: Positive for malaise/fatigue and weight loss.  Gastrointestinal: Negative for  nausea and vomiting.  Genitourinary: Negative for frequency.  Musculoskeletal:       Negative muscle weakness  Endo/Heme/Allergies: Negative for polydipsia.       Negative hypoglycemia    PHYSICAL EXAM: Blood pressure 121/83, pulse 79, temperature 98.2 F (36.8 C), temperature source Oral, height 5\' 6"  (1.676 m), weight 288 lb (130.6 kg), SpO2 98 %. Body mass index is 46.48 kg/m. Physical Exam  Constitutional: She is oriented to person, place, and time. She appears well-developed and well-nourished.  Cardiovascular: Normal rate.  Pulmonary/Chest: Effort normal.  Musculoskeletal: Normal range of motion.  Neurological: She is oriented to person, place, and time.  Skin: Skin is warm and dry.  Psychiatric: She has a normal mood and affect.  Her behavior is normal.  Vitals reviewed.   RECENT LABS AND TESTS: BMET    Component Value Date/Time   NA 139 10/08/2017 1239   K 3.8 10/08/2017 1239   CL 97 10/08/2017 1239   CO2 26 10/08/2017 1239   GLUCOSE 88 10/08/2017 1239   GLUCOSE 92 08/28/2017 1623   BUN 13 10/08/2017 1239   CREATININE 0.97 10/08/2017 1239   CALCIUM 9.1 10/08/2017 1239   GFRNONAA 66 10/08/2017 1239   GFRAA 77 10/08/2017 1239   Lab Results  Component Value Date   HGBA1C 6.0 (H) 10/08/2017   HGBA1C 6.3 06/25/2017   HGBA1C 6.3 01/22/2017   HGBA1C 6.0 07/11/2016   HGBA1C 6.2 12/13/2015   Lab Results  Component Value Date   INSULIN 10.6 10/08/2017   CBC    Component Value Date/Time   WBC 6.9 10/08/2017 1239   WBC 6.0 06/25/2017 1541   RBC 4.66 10/08/2017 1239   RBC 4.70 06/25/2017 1541   HGB 12.3 10/08/2017 1239   HCT 37.2 10/08/2017 1239   PLT 262.0 06/25/2017 1541   MCV 80 10/08/2017 1239   MCH 26.4 (L) 10/08/2017 1239   MCHC 33.1 10/08/2017 1239   MCHC 32.5 06/25/2017 1541   RDW 15.1 10/08/2017 1239   LYMPHSABS 1.6 10/08/2017 1239   MONOABS 0.5 06/25/2017 1541   EOSABS 0.2 10/08/2017 1239   BASOSABS 0.0 10/08/2017 1239   Iron/TIBC/Ferritin/ %Sat    Component Value Date/Time   IRON 162 (H) 01/22/2017 1152   FERRITIN 14.5 01/22/2017 1152   Lipid Panel     Component Value Date/Time   CHOL 112 10/08/2017 1239   TRIG 69 10/08/2017 1239   HDL 50 10/08/2017 1239   CHOLHDL 2 06/25/2017 1541   VLDL 15.0 06/25/2017 1541   LDLCALC 48 10/08/2017 1239   Hepatic Function Panel     Component Value Date/Time   PROT 7.2 10/08/2017 1239   ALBUMIN 3.5 10/08/2017 1239   AST 17 10/08/2017 1239   ALT 9 10/08/2017 1239   ALKPHOS 88 10/08/2017 1239   BILITOT 0.4 10/08/2017 1239      Component Value Date/Time   TSH 1.480 10/08/2017 1239   TSH 1.24 06/25/2017 1541   TSH 1.53 07/11/2016 1150  Results for DESIRAE, MANCUSI (MRN [id number]) as of 10/24/2017 17:57  Ref. Range 10/08/2017 12:39    Vitamin D, 25-Hydroxy Latest Ref Range: 30.0 - 100.0 ng/mL 47.7    ASSESSMENT AND PLAN: Prediabetes - Plan: metFORMIN (GLUCOPHAGE) 500 MG tablet  Vitamin D deficiency - Plan: Cholecalciferol (VITAMIN D-3) 5000 units TABS  At risk for diabetes mellitus  Class 3 severe obesity with serious comorbidity and body mass index (BMI) of 45.0 to 49.9 in adult, unspecified obesity type ([place])  PLAN:  Vitamin  D Deficiency Roberta [name] was informed that low vitamin D levels contributes to fatigue and are associated with obesity, breast, and colon cancer. Roberta [name] agrees to start OTC Vit D 5,000 IU daily and will follow up for routine testing of vitamin D, at least 2-3 times per year. She was informed of the risk of over-replacement of vitamin D and agrees to not increase her dose unless she discusses this with [place] first. Roberta [name] agrees to follow up with our clinic in 2 weeks.  Pre-Diabetes Roberta [name] will continue to work on weight loss, exercise, and decreasing simple carbohydrates in her diet to help decrease the risk of diabetes. We dicussed metformin including benefits and risks. She was informed that eating too many simple carbohydrates or too many calories at one sitting increases the likelihood of GI side effects. Roberta [name] agrees to start metformin 500 mg PO q AM #30 with no refills. Roberta [name] agrees to follow up with our clinic in 2 weeks as directed to monitor her progress.  Diabetes risk counselling Roberta [name] was given extended (30 minutes) diabetes prevention counseling today. She is 52 y.o. female and has risk factors for diabetes including obesity and pre-diabetes. We discussed intensive lifestyle modifications today with an emphasis on weight loss as well as increasing exercise and decreasing simple carbohydrates in her diet.  Obesity Roberta [name] is currently in the action stage of change. As such, her goal is to continue with weight loss efforts She has agreed to follow the Category 2 plan Roberta [name] has been instructed to  work up to a goal of 150 minutes of combined cardio and strengthening exercise per week for weight loss and overall health benefits. We discussed the following Behavioral Modification Strategies today: increasing lean protein intake, increasing vegetables, work on meal planning and easy cooking plans, and planning for success   Roberta [name] has agreed to follow up with our clinic in 2 weeks. She was informed of the importance of frequent follow up visits to maximize her success with intensive lifestyle modifications for her multiple health conditions.   OBESITY BEHAVIORAL INTERVENTION VISIT  Today's visit was # 2   Starting weight: 292 lbs Starting date: 10/08/17 Today's weight : 288 lbs  Today's date: 10/22/2017 Total lbs lost to date: 4    ASK: We discussed the diagnosis of obesity with Roberta [name] today and Roberta [name] agreed to give [place] permission to discuss obesity behavioral modification therapy today.  ASSESS: Roberta [name] has the diagnosis of obesity and her BMI today is 46.51 Roberta [name] is in the action stage of change   ADVISE: Roberta [name] was educated on the multiple health risks of obesity as well as the benefit of weight loss to improve her health. She was advised of the need for long term treatment and the importance of lifestyle modifications to improve her current health and to decrease her risk of future health problems.  AGREE: Multiple dietary modification options and treatment options were discussed and  Roberta [name] agreed to follow the recommendations documented in the above note.  ARRANGE: Roberta [name] was educated on the importance of frequent visits to treat obesity as outlined per CMS and USPSTF guidelines and agreed to schedule her next follow up appointment today.  I, Trixie Dredge, am acting as transcriptionist for Ilene Qua, MD  I have reviewed the above documentation for accuracy and completeness, and I agree with the above. - Ilene Qua, MD

## 2017-11-07 ENCOUNTER — Ambulatory Visit (INDEPENDENT_AMBULATORY_CARE_PROVIDER_SITE_OTHER): Payer: BC Managed Care – PPO | Admitting: Family Medicine

## 2017-11-07 ENCOUNTER — Encounter (INDEPENDENT_AMBULATORY_CARE_PROVIDER_SITE_OTHER): Payer: Self-pay | Admitting: Family Medicine

## 2017-11-07 VITALS — BP 119/79 | HR 69 | Temp 98.1°F | Ht 66.0 in | Wt 283.0 lb

## 2017-11-07 DIAGNOSIS — R7303 Prediabetes: Secondary | ICD-10-CM

## 2017-11-07 DIAGNOSIS — Z6841 Body Mass Index (BMI) 40.0 and over, adult: Secondary | ICD-10-CM

## 2017-11-07 DIAGNOSIS — E559 Vitamin D deficiency, unspecified: Secondary | ICD-10-CM

## 2017-11-08 NOTE — Progress Notes (Signed)
Office: [phone]  /  Fax: [phone]   HPI:   Chief Complaint: OBESITY Roberta [name] is here to discuss her progress with her obesity treatment plan. She is on the Category 2 plan and is following her eating plan approximately 95% of the time. She states she is at the gym for 30 minutes 4 times per week. Roberta [name] notes cravings for fried chicken from [employer]. She had some snacks and not on the program recently.  Her weight is 283 lb (128.4 kg) today and has had a weight loss of 5 pounds over a period of 2 weeks since her last visit. She has lost 9 lbs since starting treatment with [place].  Vitamin D Deficiency Roberta [name] has a diagnosis of vitamin D deficiency. She is currently taking OTC Vit D, but level is not yet at goal. She denies nausea, vomiting or muscle weakness.  Pre-Diabetes Roberta [name] has a diagnosis of pre-diabetes based on her elevated Hgb A1c and was informed this puts her at greater risk of developing diabetes. She is on metformin but she is taking it at night rather than in the morning. She denies nausea, vomiting, or diarrhea. She continues to work on diet and exercise to decrease risk of diabetes. She denies polyphagia or hypoglycemia.  ALLERGIES: Allergies  Allergen Reactions  . Lisinopril Swelling    Swelling of the tongue  . Penicillins     Pt told as a child-unknown reaction.  . Tetanus Toxoids Swelling    Swelling at site of injection  . Norvasc [Amlodipine Besylate] Rash    MEDICATIONS: Current Outpatient Medications on File Prior to Visit  Medication Sig Dispense Refill  . Black Cohosh 40 MG CAPS Take 1 capsule by mouth daily.    . Cholecalciferol (VITAMIN D-3) 5000 units TABS Take 1 tablet by mouth daily. 30 tablet   . losartan-hydrochlorothiazide (HYZAAR) 100-25 MG tablet TAKE 1 TABLET BY MOUTH EVERY DAY 90 tablet 1  . metFORMIN (GLUCOPHAGE) 500 MG tablet Take 1 tablet (500 mg total) by mouth daily with breakfast. 30 tablet 0  . Multiple Vitamin (MULTIVITAMIN)  tablet Take 1 tablet by mouth daily.    . norethindrone (MICRONOR,CAMILA,ERRIN) 0.35 MG tablet Take 1 tablet by mouth daily.     No current facility-administered medications on file prior to visit.     PAST MEDICAL HISTORY: Past Medical History:  Diagnosis Date  . Back pain   . Hip pain   . Hypertension   . Obesity   . Prediabetes   . Swelling    bilat LE    PAST SURGICAL HISTORY: Past Surgical History:  Procedure Laterality Date  . CHOLECYSTECTOMY    . GASTRIC BYPASS    . KNEE SURGERY  2002   Ligament repair    SOCIAL HISTORY: Social History   Tobacco Use  . Smoking status: Never Smoker  . Smokeless tobacco: Never Used  Substance Use Topics  . Alcohol use: No  . Drug use: No    FAMILY HISTORY: Family History  Problem Relation Age of Onset  . Diabetes Mother   . Hypertension Mother   . Heart disease Mother   . Stroke Mother   . Kidney disease Mother   . Obesity Mother   . Cancer Maternal Aunt        lung  . Cancer Maternal Uncle        lung    ROS: Review of Systems  Constitutional: Positive for weight loss.  Gastrointestinal: Negative for diarrhea, nausea and vomiting.  Musculoskeletal:       Negative muscle weakness  Endo/Heme/Allergies:       Negative polyphagia Negative hypoglycemia    PHYSICAL EXAM: Blood pressure 119/79, pulse 69, temperature 98.1 F (36.7 C), temperature source Oral, height 5\' 6"  (1.676 m), weight 283 lb (128.4 kg), SpO2 98 %. Body mass index is 45.68 kg/m. Physical Exam  Constitutional: She is oriented to person, place, and time. She appears well-developed and well-nourished.  Cardiovascular: Normal rate.  Pulmonary/Chest: Effort normal.  Musculoskeletal: Normal range of motion.  Neurological: She is oriented to person, place, and time.  Skin: Skin is warm and dry.  Psychiatric: She has a normal mood and affect. Her behavior is normal.  Vitals reviewed.   RECENT LABS AND TESTS: BMET    Component Value  Date/Time   NA 139 10/08/2017 1239   K 3.8 10/08/2017 1239   CL 97 10/08/2017 1239   CO2 26 10/08/2017 1239   GLUCOSE 88 10/08/2017 1239   GLUCOSE 92 08/28/2017 1623   BUN 13 10/08/2017 1239   CREATININE 0.97 10/08/2017 1239   CALCIUM 9.1 10/08/2017 1239   GFRNONAA 66 10/08/2017 1239   GFRAA 77 10/08/2017 1239   Lab Results  Component Value Date   HGBA1C 6.0 (H) 10/08/2017   HGBA1C 6.3 06/25/2017   HGBA1C 6.3 01/22/2017   HGBA1C 6.0 07/11/2016   HGBA1C 6.2 12/13/2015   Lab Results  Component Value Date   INSULIN 10.6 10/08/2017   CBC    Component Value Date/Time   WBC 6.9 10/08/2017 1239   WBC 6.0 06/25/2017 1541   RBC 4.66 10/08/2017 1239   RBC 4.70 06/25/2017 1541   HGB 12.3 10/08/2017 1239   HCT 37.2 10/08/2017 1239   PLT 262.0 06/25/2017 1541   MCV 80 10/08/2017 1239   MCH 26.4 (L) 10/08/2017 1239   MCHC 33.1 10/08/2017 1239   MCHC 32.5 06/25/2017 1541   RDW 15.1 10/08/2017 1239   LYMPHSABS 1.6 10/08/2017 1239   MONOABS 0.5 06/25/2017 1541   EOSABS 0.2 10/08/2017 1239   BASOSABS 0.0 10/08/2017 1239   Iron/TIBC/Ferritin/ %Sat    Component Value Date/Time   IRON 162 (H) 01/22/2017 1152   FERRITIN 14.5 01/22/2017 1152   Lipid Panel     Component Value Date/Time   CHOL 112 10/08/2017 1239   TRIG 69 10/08/2017 1239   HDL 50 10/08/2017 1239   CHOLHDL 2 06/25/2017 1541   VLDL 15.0 06/25/2017 1541   LDLCALC 48 10/08/2017 1239   Hepatic Function Panel     Component Value Date/Time   PROT 7.2 10/08/2017 1239   ALBUMIN 3.5 10/08/2017 1239   AST 17 10/08/2017 1239   ALT 9 10/08/2017 1239   ALKPHOS 88 10/08/2017 1239   BILITOT 0.4 10/08/2017 1239      Component Value Date/Time   TSH 1.480 10/08/2017 1239   TSH 1.24 06/25/2017 1541   TSH 1.53 07/11/2016 1150  Results for Roberta, [name] (MRN [id number]) as of 11/08/2017 16:59  Ref. Range 10/08/2017 12:39  Vitamin D, 25-Hydroxy Latest Ref Range: 30.0 - 100.0 ng/mL 47.7    ASSESSMENT AND  PLAN: Vitamin D deficiency  Prediabetes  Class 3 severe obesity with serious comorbidity and body mass index (BMI) of 45.0 to 49.9 in adult, unspecified obesity type ([place])  PLAN:  Vitamin D Deficiency Roberta [name] was informed that low vitamin D levels contributes to fatigue and are associated with obesity, breast, and colon cancer. Machelle agrees to continue taking OTC Vit D and will  follow up for routine testing of vitamin D, at least 2-3 times per year. She was informed of the risk of over-replacement of vitamin D and agrees to not increase her dose unless she discusses this with [place] first. Joscelynn agrees to follow up with our clinic in 2 weeks.  Pre-Diabetes Christeen will continue to work on weight loss, diet, exercise, and decreasing simple carbohydrates in her diet to help decrease the risk of diabetes. We dicussed metformin including benefits and risks. She was informed that eating too many simple carbohydrates or too many calories at one sitting increases the likelihood of GI side effects. Eriyana agrees to continue taking metformin, no refill needed, and she agrees to follow up with our clinic in 2 weeks as directed to monitor her progress.  I spent > than 50% of the 15 minute visit on counseling as documented in the note.  Obesity Bela is currently in the action stage of change. As such, her goal is to continue with weight loss efforts She has agreed to follow the Category 2 plan Analeya has been instructed to  Continue exercising at gym for 30 minutes 4 times per week for weight loss and overall health benefits. We discussed the following Behavioral Modification Strategies today: ways to avoid night time snacking and planning for success Madelyne is able to have fried chicken for 1 meal at [employer] to alleviate craving. She is to use snack calories for this and choose healthy sides.  Ayanah has agreed to follow up with our clinic in 2 weeks. She was informed of the importance of frequent follow  up visits to maximize her success with intensive lifestyle modifications for her multiple health conditions.   OBESITY BEHAVIORAL INTERVENTION VISIT  Today's visit was # 3  Starting weight: 292 lbs Starting date: 10/08/17 Today's weight : 283 lbs  Today's date: 11/07/2017 Total lbs lost to date: 9    ASK: We discussed the diagnosis of obesity with Jonnie Kind today and Tanya agreed to give [place] permission to discuss obesity behavioral modification therapy today.  ASSESS: Aylyn has the diagnosis of obesity and her BMI today is 45.7 Lorri is in the action stage of change   ADVISE: Eunique was educated on the multiple health risks of obesity as well as the benefit of weight loss to improve her health. She was advised of the need for long term treatment and the importance of lifestyle modifications to improve her current health and to decrease her risk of future health problems.  AGREE: Multiple dietary modification options and treatment options were discussed and  Shalah agreed to follow the recommendations documented in the above note.  ARRANGE: Keilany was educated on the importance of frequent visits to treat obesity as outlined per CMS and USPSTF guidelines and agreed to schedule her next follow up appointment today.  Wilhemena Durie, am acting as Location manager for [employer], FNP-C.  I have reviewed the above documentation for accuracy and completeness, and I agree with the above.  - Karlene Southard, FNP-C.

## 2017-11-12 ENCOUNTER — Encounter (INDEPENDENT_AMBULATORY_CARE_PROVIDER_SITE_OTHER): Payer: Self-pay | Admitting: Family Medicine

## 2017-11-21 ENCOUNTER — Ambulatory Visit (INDEPENDENT_AMBULATORY_CARE_PROVIDER_SITE_OTHER): Payer: BC Managed Care – PPO | Admitting: Physician Assistant

## 2017-11-21 ENCOUNTER — Encounter (INDEPENDENT_AMBULATORY_CARE_PROVIDER_SITE_OTHER): Payer: Self-pay | Admitting: Physician Assistant

## 2017-11-21 VITALS — BP 121/78 | HR 77 | Temp 97.9°F | Ht 66.0 in | Wt 280.0 lb

## 2017-11-21 DIAGNOSIS — R7303 Prediabetes: Secondary | ICD-10-CM | POA: Diagnosis not present

## 2017-11-21 DIAGNOSIS — E559 Vitamin D deficiency, unspecified: Secondary | ICD-10-CM

## 2017-11-21 DIAGNOSIS — Z9189 Other specified personal risk factors, not elsewhere classified: Secondary | ICD-10-CM | POA: Diagnosis not present

## 2017-11-21 DIAGNOSIS — Z6841 Body Mass Index (BMI) 40.0 and over, adult: Secondary | ICD-10-CM

## 2017-11-26 ENCOUNTER — Telehealth (INDEPENDENT_AMBULATORY_CARE_PROVIDER_SITE_OTHER): Payer: Self-pay | Admitting: Family Medicine

## 2017-11-26 MED ORDER — METFORMIN HCL 500 MG PO TABS: 500.0000 mg | ORAL_TABLET | Freq: Every day | ORAL | 0 refills | Status: DC

## 2017-11-26 NOTE — Telephone Encounter (Signed)
Pt called last week requesting refill on metformin; checked with pharmacy and still has not been called in. Please advise. Requests it be called to [facility] on [address]. [address]h

## 2017-11-26 NOTE — Progress Notes (Signed)
Office: [phone]  /  Fax: [phone]   HPI:   Chief Complaint: OBESITY Roberta [name] is here to discuss her progress with her obesity treatment plan. She is on the Category 2 plan and is following her eating plan approximately 90-95 % of the time. She states she is at the gym and doing cardio for 30 minutes 4 times per week. Roberta [name] did well with weight loss. She reports being worried about holiday eating.  Her weight is 280 lb (127 kg) today and has had a weight loss of 3 pounds over a period of 2 weeks since her last visit. She has lost 12 lbs since starting treatment with [place].  Pre-Diabetes Roberta [name] has a diagnosis of pre-diabetes based on her elevated Hgb A1c and was informed this puts her at greater risk of developing diabetes. She she denies nausea, vomiting, or diarrhea on metformin and continues to work on diet and exercise to decrease risk of diabetes. She denies polyphagia or hypoglycemia.  At risk for diabetes Roberta [name] is at higher than average risk for developing diabetes due to her obesity and pre-diabetes. She currently denies polyuria or polydipsia.  Vitamin D Deficiency [name] has a diagnosis of vitamin D deficiency. She is on OTC Vit D 5,000 IU daily and denies nausea, vomiting or muscle weakness.  ALLERGIES: Allergies  Allergen Reactions  . Lisinopril Swelling    Swelling of the tongue  . Penicillins     Pt told as a child-unknown reaction.  . Tetanus Toxoids Swelling    Swelling at site of injection  . Norvasc [Amlodipine Besylate] Rash    MEDICATIONS: Current Outpatient Medications on File Prior to Visit  Medication Sig Dispense Refill  . Black Cohosh 40 MG CAPS Take 1 capsule by mouth daily.    . Cholecalciferol (VITAMIN D-3) 5000 units TABS Take 1 tablet by mouth daily. 30 tablet   . losartan-hydrochlorothiazide (HYZAAR) 100-25 MG tablet TAKE 1 TABLET BY MOUTH EVERY DAY 90 tablet 1  . metFORMIN (GLUCOPHAGE) 500 MG tablet Take 1 tablet (500 mg total) by mouth daily  with breakfast. 30 tablet 0  . Multiple Vitamin (MULTIVITAMIN) tablet Take 1 tablet by mouth daily.    . norethindrone (MICRONOR,CAMILA,ERRIN) 0.35 MG tablet Take 1 tablet by mouth daily.     No current facility-administered medications on file prior to visit.     PAST MEDICAL HISTORY: Past Medical History:  Diagnosis Date  . Back pain   . Hip pain   . Hypertension   . Obesity   . Prediabetes   . Swelling    bilat LE    PAST SURGICAL HISTORY: Past Surgical History:  Procedure Laterality Date  . CHOLECYSTECTOMY    . GASTRIC BYPASS    . KNEE SURGERY  2002   Ligament repair    SOCIAL HISTORY: Social History   Tobacco Use  . Smoking status: Never Smoker  . Smokeless tobacco: Never Used  Substance Use Topics  . Alcohol use: No  . Drug use: No    FAMILY HISTORY: Family History  Problem Relation Age of Onset  . Diabetes Mother   . Hypertension Mother   . Heart disease Mother   . Stroke Mother   . Kidney disease Mother   . Obesity Mother   . Cancer Maternal Aunt        lung  . Cancer Maternal Uncle        lung    ROS: Review of Systems  Constitutional: Positive for weight loss.  Gastrointestinal:  Negative for diarrhea, nausea and vomiting.  Genitourinary: Negative for frequency.  Musculoskeletal:       Negative muscle weakness  Endo/Heme/Allergies: Negative for polydipsia.       Negative polyphagia Negative hypoglycemia    PHYSICAL EXAM: Blood pressure 121/78, pulse 77, temperature 97.9 F (36.6 C), temperature source Oral, height 5\' 6"  (1.676 m), weight 280 lb (127 kg), SpO2 100 %. Body mass index is 45.19 kg/m. Physical Exam  Constitutional: She is oriented to person, place, and time. She appears well-developed and well-nourished.  Cardiovascular: Normal rate.  Pulmonary/Chest: Effort normal.  Musculoskeletal: Normal range of motion.  Neurological: She is oriented to person, place, and time.  Skin: Skin is warm and dry.  Psychiatric: She has a  normal mood and affect. Her behavior is normal.  Vitals reviewed.   RECENT LABS AND TESTS: BMET    Component Value Date/Time   NA 139 10/08/2017 1239   K 3.8 10/08/2017 1239   CL 97 10/08/2017 1239   CO2 26 10/08/2017 1239   GLUCOSE 88 10/08/2017 1239   GLUCOSE 92 08/28/2017 1623   BUN 13 10/08/2017 1239   CREATININE 0.97 10/08/2017 1239   CALCIUM 9.1 10/08/2017 1239   GFRNONAA 66 10/08/2017 1239   GFRAA 77 10/08/2017 1239   Lab Results  Component Value Date   HGBA1C 6.0 (H) 10/08/2017   HGBA1C 6.3 06/25/2017   HGBA1C 6.3 01/22/2017   HGBA1C 6.0 07/11/2016   HGBA1C 6.2 12/13/2015   Lab Results  Component Value Date   INSULIN 10.6 10/08/2017   CBC    Component Value Date/Time   WBC 6.9 10/08/2017 1239   WBC 6.0 06/25/2017 1541   RBC 4.66 10/08/2017 1239   RBC 4.70 06/25/2017 1541   HGB 12.3 10/08/2017 1239   HCT 37.2 10/08/2017 1239   PLT 262.0 06/25/2017 1541   MCV 80 10/08/2017 1239   MCH 26.4 (L) 10/08/2017 1239   MCHC 33.1 10/08/2017 1239   MCHC 32.5 06/25/2017 1541   RDW 15.1 10/08/2017 1239   LYMPHSABS 1.6 10/08/2017 1239   MONOABS 0.5 06/25/2017 1541   EOSABS 0.2 10/08/2017 1239   BASOSABS 0.0 10/08/2017 1239   Iron/TIBC/Ferritin/ %Sat    Component Value Date/Time   IRON 162 (H) 01/22/2017 1152   FERRITIN 14.5 01/22/2017 1152   Lipid Panel     Component Value Date/Time   CHOL 112 10/08/2017 1239   TRIG 69 10/08/2017 1239   HDL 50 10/08/2017 1239   CHOLHDL 2 06/25/2017 1541   VLDL 15.0 06/25/2017 1541   LDLCALC 48 10/08/2017 1239   Hepatic Function Panel     Component Value Date/Time   PROT 7.2 10/08/2017 1239   ALBUMIN 3.5 10/08/2017 1239   AST 17 10/08/2017 1239   ALT 9 10/08/2017 1239   ALKPHOS 88 10/08/2017 1239   BILITOT 0.4 10/08/2017 1239      Component Value Date/Time   TSH 1.480 10/08/2017 1239   TSH 1.24 06/25/2017 1541   TSH 1.53 07/11/2016 1150  Results for ESTHEFANY, [name] (MRN [id number]) as of 11/26/2017 13:19  Ref.  Range 10/08/2017 12:39  Vitamin D, 25-Hydroxy Latest Ref Range: 30.0 - 100.0 ng/mL 47.7    ASSESSMENT AND PLAN: Prediabetes - Plan: metFORMIN (GLUCOPHAGE) 500 MG tablet  Vitamin D deficiency  At risk for diabetes mellitus  Class 3 severe obesity with serious comorbidity and body mass index (BMI) of 45.0 to 49.9 in adult, unspecified obesity type [facility])  PLAN:  Pre-Diabetes Shaneika will continue to  work on weight loss, exercise, and decreasing simple carbohydrates in her diet to help decrease the risk of diabetes. We dicussed metformin including benefits and risks. She was informed that eating too many simple carbohydrates or too many calories at one sitting increases the likelihood of GI side effects. Vicenta agrees to continue taking metformin 500 mg q AM #30 and we will refill for 1 month. Alize agrees to follow up with our clinic in 2 weeks as directed to monitor her progress.  Diabetes risk counselling Arcelia was given extended (15 minutes) diabetes prevention counseling today. She is 52 y.o. female and has risk factors for diabetes including obesity and pre-diabetes. We discussed intensive lifestyle modifications today with an emphasis on weight loss as well as increasing exercise and decreasing simple carbohydrates in her diet.  Vitamin D Deficiency Parissa was informed that low vitamin D levels contributes to fatigue and are associated with obesity, breast, and colon cancer. Zafirah agrees to continue taking OTC Vit D 5,000 IU daily and will follow up for routine testing of vitamin D, at least 2-3 times per year. She was informed of the risk of over-replacement of vitamin D and agrees to not increase her dose unless she discusses this with [place] first. Vara agrees to follow up with our clinic in 2 weeks.  Obesity Chevie is currently in the action stage of change. As such, her goal is to continue with weight loss efforts She has agreed to follow the Category 2 plan Jodel has been instructed to  work up to a goal of 150 minutes of combined cardio and strengthening exercise per week for weight loss and overall health benefits. We discussed the following Behavioral Modification Strategies today: work on meal planning and easy cooking plans and holiday eating strategies    Lawanna has agreed to follow up with our clinic in 2 weeks. She was informed of the importance of frequent follow up visits to maximize her success with intensive lifestyle modifications for her multiple health conditions.   OBESITY BEHAVIORAL INTERVENTION VISIT  Today's visit was # 4   Starting weight: 292 lbs Starting date: 10/08/17 Today's weight : 280 lbs Today's date: 11/21/2017 Total lbs lost to date: 12    ASK: We discussed the diagnosis of obesity with Jonnie Kind today and Deseri agreed to give [place] permission to discuss obesity behavioral modification therapy today.  ASSESS: Lakresha has the diagnosis of obesity and her BMI today is 45.21 Sultana is in the action stage of change   ADVISE: Jaianna was educated on the multiple health risks of obesity as well as the benefit of weight loss to improve her health. She was advised of the need for long term treatment and the importance of lifestyle modifications.  AGREE: Multiple dietary modification options and treatment options were discussed and  Haden agreed to the above obesity treatment plan.  Wilhemena Durie, am acting as transcriptionist for Abby Potash, PA-C I, Abby Potash, PA-C have reviewed above note and agree with its content

## 2017-11-27 ENCOUNTER — Other Ambulatory Visit (INDEPENDENT_AMBULATORY_CARE_PROVIDER_SITE_OTHER): Payer: Self-pay

## 2017-11-27 DIAGNOSIS — R7303 Prediabetes: Secondary | ICD-10-CM

## 2017-11-27 MED ORDER — METFORMIN HCL 500 MG PO TABS: 500.0000 mg | ORAL_TABLET | Freq: Every day | ORAL | 0 refills | Status: DC

## 2017-11-27 NOTE — Telephone Encounter (Signed)
Medication sent. Larayne Baxley, LPN

## 2017-12-06 ENCOUNTER — Encounter (INDEPENDENT_AMBULATORY_CARE_PROVIDER_SITE_OTHER): Payer: Self-pay | Admitting: Family Medicine

## 2017-12-06 ENCOUNTER — Ambulatory Visit (INDEPENDENT_AMBULATORY_CARE_PROVIDER_SITE_OTHER): Payer: BC Managed Care – PPO | Admitting: Family Medicine

## 2017-12-06 VITALS — BP 119/80 | HR 72 | Temp 98.0°F | Ht 66.0 in | Wt 277.0 lb

## 2017-12-06 DIAGNOSIS — R600 Localized edema: Secondary | ICD-10-CM | POA: Diagnosis not present

## 2017-12-06 DIAGNOSIS — Z6841 Body Mass Index (BMI) 40.0 and over, adult: Secondary | ICD-10-CM

## 2017-12-06 DIAGNOSIS — R7303 Prediabetes: Secondary | ICD-10-CM | POA: Diagnosis not present

## 2017-12-10 ENCOUNTER — Encounter (INDEPENDENT_AMBULATORY_CARE_PROVIDER_SITE_OTHER): Payer: Self-pay | Admitting: Family Medicine

## 2017-12-10 NOTE — Progress Notes (Signed)
Office: [phone]  /  Fax: [phone]   HPI:   Chief Complaint: OBESITY Roberta [name] is here to discuss her progress with her obesity treatment plan. She is on the  follow the Category 2 plan and is following her eating plan approximately 90 % of the time. She states she is exercising 0 minutes 0 times per week. Roberta [name] is surprised at weight loss. She did not overeat over Thanksgiving.  Her weight is 277 lb (125.6 kg) today and has had a weight loss of 3 pounds over a period of 2 weeks since her last visit. She has lost 15 lbs since starting treatment with [place].  Pre-Diabetes Roberta [name] has a diagnosis of prediabetes based on her elevated HgA1c and was informed this puts her at greater risk of developing diabetes. She is taking metformin currently taking it at night and continues to work on diet and exercise to decrease risk of diabetes. She denies nausea, vomiting, diarrhea, or hypoglycemia. Lab Results  Component Value Date   HGBA1C 6.0 (H) 10/08/2017    Lower extremity edema Roberta [name] has noticed left lower extremity edema. She had one episode of shortness of breath recently while traveling. She denies chest pain. Edema subsides after elevating feet. She is taking losartan-hctz.   ALLERGIES: Allergies  Allergen Reactions  . Lisinopril Swelling    Swelling of the tongue  . Penicillins     Pt told as a child-unknown reaction.  . Tetanus Toxoids Swelling    Swelling at site of injection  . Norvasc [Amlodipine Besylate] Rash    MEDICATIONS: Current Outpatient Medications on File Prior to Visit  Medication Sig Dispense Refill  . Black Cohosh 40 MG CAPS Take 1 capsule by mouth daily.    . Cholecalciferol (VITAMIN D-3) 5000 units TABS Take 1 tablet by mouth daily. 30 tablet   . losartan-hydrochlorothiazide (HYZAAR) 100-25 MG tablet TAKE 1 TABLET BY MOUTH EVERY DAY 90 tablet 1  . metFORMIN (GLUCOPHAGE) 500 MG tablet Take 1 tablet (500 mg total) by mouth daily with breakfast. 30 tablet 0  .  Multiple Vitamin (MULTIVITAMIN) tablet Take 1 tablet by mouth daily.    . norethindrone (MICRONOR,CAMILA,ERRIN) 0.35 MG tablet Take 1 tablet by mouth daily.     No current facility-administered medications on file prior to visit.     PAST MEDICAL HISTORY: Past Medical History:  Diagnosis Date  . Back pain   . Hip pain   . Hypertension   . Obesity   . Prediabetes   . Swelling    bilat LE    PAST SURGICAL HISTORY: Past Surgical History:  Procedure Laterality Date  . CHOLECYSTECTOMY    . GASTRIC BYPASS    . KNEE SURGERY  2002   Ligament repair    SOCIAL HISTORY: Social History   Tobacco Use  . Smoking status: Never Smoker  . Smokeless tobacco: Never Used  Substance Use Topics  . Alcohol use: No  . Drug use: No    FAMILY HISTORY: Family History  Problem Relation Age of Onset  . Diabetes Mother   . Hypertension Mother   . Heart disease Mother   . Stroke Mother   . Kidney disease Mother   . Obesity Mother   . Cancer Maternal Aunt        lung  . Cancer Maternal Uncle        lung    ROS: Review of Systems  Constitutional: Positive for weight loss.  Cardiovascular: Positive for leg swelling.  Gastrointestinal: Negative for  diarrhea, nausea and vomiting.  Endo/Heme/Allergies:       Negative for hypoglycemia    PHYSICAL EXAM: Blood pressure 119/80, pulse 72, temperature 98 F (36.7 C), temperature source Oral, height 5\' 6"  (1.676 m), weight 277 lb (125.6 kg), SpO2 100 %. Body mass index is 44.71 kg/m. Physical Exam  Constitutional: She is oriented to person, place, and time. She appears well-developed and well-nourished.  HENT:  Head: Normocephalic.  Eyes: Pupils are equal, round, and reactive to light.  Neck: Normal range of motion.  Cardiovascular: Normal rate.  Pulmonary/Chest: Effort normal.  Musculoskeletal: Normal range of motion.  Neurological: She is alert and oriented to person, place, and time.  Skin: Skin is warm and dry.  Psychiatric: She  has a normal mood and affect. Her behavior is normal.  Vitals reviewed.   RECENT LABS AND TESTS: BMET    Component Value Date/Time   NA 139 10/08/2017 1239   K 3.8 10/08/2017 1239   CL 97 10/08/2017 1239   CO2 26 10/08/2017 1239   GLUCOSE 88 10/08/2017 1239   GLUCOSE 92 08/28/2017 1623   BUN 13 10/08/2017 1239   CREATININE 0.97 10/08/2017 1239   CALCIUM 9.1 10/08/2017 1239   GFRNONAA 66 10/08/2017 1239   GFRAA 77 10/08/2017 1239   Lab Results  Component Value Date   HGBA1C 6.0 (H) 10/08/2017   HGBA1C 6.3 06/25/2017   HGBA1C 6.3 01/22/2017   HGBA1C 6.0 07/11/2016   HGBA1C 6.2 12/13/2015   Lab Results  Component Value Date   INSULIN 10.6 10/08/2017   CBC    Component Value Date/Time   WBC 6.9 10/08/2017 1239   WBC 6.0 06/25/2017 1541   RBC 4.66 10/08/2017 1239   RBC 4.70 06/25/2017 1541   HGB 12.3 10/08/2017 1239   HCT 37.2 10/08/2017 1239   PLT 262.0 06/25/2017 1541   MCV 80 10/08/2017 1239   MCH 26.4 (L) 10/08/2017 1239   MCHC 33.1 10/08/2017 1239   MCHC 32.5 06/25/2017 1541   RDW 15.1 10/08/2017 1239   LYMPHSABS 1.6 10/08/2017 1239   MONOABS 0.5 06/25/2017 1541   EOSABS 0.2 10/08/2017 1239   BASOSABS 0.0 10/08/2017 1239   Iron/TIBC/Ferritin/ %Sat    Component Value Date/Time   IRON 162 (H) 01/22/2017 1152   FERRITIN 14.5 01/22/2017 1152   Lipid Panel     Component Value Date/Time   CHOL 112 10/08/2017 1239   TRIG 69 10/08/2017 1239   HDL 50 10/08/2017 1239   CHOLHDL 2 06/25/2017 1541   VLDL 15.0 06/25/2017 1541   LDLCALC 48 10/08/2017 1239   Hepatic Function Panel     Component Value Date/Time   PROT 7.2 10/08/2017 1239   ALBUMIN 3.5 10/08/2017 1239   AST 17 10/08/2017 1239   ALT 9 10/08/2017 1239   ALKPHOS 88 10/08/2017 1239   BILITOT 0.4 10/08/2017 1239      Component Value Date/Time   TSH 1.480 10/08/2017 1239   TSH 1.24 06/25/2017 1541   TSH 1.53 07/11/2016 1150    ASSESSMENT AND PLAN: Prediabetes  Lower extremity  edema  Class 3 severe obesity with serious comorbidity and body mass index (BMI) of 40.0 to 44.9 in adult, unspecified obesity type ([place])  PLAN: Pre-Diabetes Roberta [name] will continue to work on weight loss, exercise, and decreasing simple carbohydrates in her diet to help decrease the risk of diabetes. We dicussed metformin including benefits and risks. She was informed that eating too many simple carbohydrates or too many calories at one sitting  increases the likelihood of GI side effects. Roberta [name] agrees to continue metformin and meal plan.  Roberta [name] agreed to follow up with [place] as directed to monitor her progress.  Lower extremity edema We discussed decreasing sodium in diet. She will continue losartan-hctz as prescribed, but will let her PCP know if the edema worsens or if she has increased shortness of breath. Agrees to follow up with our clinic as directed.   I spent > than 50% of the 15 minute visit on counseling as documented in the note.  Obesity Roberta [name] is currently in the action stage of change. As such, her goal is to continue with weight loss efforts She has agreed to follow the Category 2 plan Roberta [name] has been instructed not to work out yet.  We discussed the following Behavioral Modification Strategies today: holiday eating strategies and planning for success.    Roberta [name] has agreed to follow up with our clinic in 2 weeks. She was informed of the importance of frequent follow up visits to maximize her success with intensive lifestyle modifications for her multiple health conditions.   OBESITY BEHAVIORAL INTERVENTION VISIT  Today's visit was # 5   Starting weight: 292 lb Starting date: 10/08/17 Today's weight : Weight: 277 lb (125.6 kg)  Today's date: 12/06/17 Total lbs lost to date: 15 lb    ASK: We discussed the diagnosis of obesity with Roberta [name] today and Roberta [name] agreed to give [place] permission to discuss obesity behavioral modification therapy today.  ASSESS: Roberta [name] has the  diagnosis of obesity and her BMI today is 44.73 Roberta [name] is in the action stage of change   ADVISE: Roberta [name] was educated on the multiple health risks of obesity as well as the benefit of weight loss to improve her health. She was advised of the need for long term treatment and the importance of lifestyle modifications to improve her current health and to decrease her risk of future health problems.  AGREE: Multiple dietary modification options and treatment options were discussed and  Roberta [name] agreed to follow the recommendations documented in the above note.  ARRANGE: Roberta [name] was educated on the importance of frequent visits to treat obesity as outlined per CMS and USPSTF guidelines and agreed to schedule her next follow up appointment today.  I, Renee Ramus, am acting as Location manager for [employer], FNP-C.  I have reviewed the above documentation for accuracy and completeness, and I agree with the above.  - Dawn Whitmire, FNP-C.

## 2017-12-20 ENCOUNTER — Encounter (INDEPENDENT_AMBULATORY_CARE_PROVIDER_SITE_OTHER): Payer: Self-pay | Admitting: Family Medicine

## 2017-12-20 ENCOUNTER — Ambulatory Visit (INDEPENDENT_AMBULATORY_CARE_PROVIDER_SITE_OTHER): Payer: BC Managed Care – PPO | Admitting: Family Medicine

## 2017-12-20 VITALS — BP 107/73 | HR 70 | Temp 97.7°F | Ht 66.0 in | Wt 273.0 lb

## 2017-12-20 DIAGNOSIS — I10 Essential (primary) hypertension: Secondary | ICD-10-CM | POA: Diagnosis not present

## 2017-12-20 DIAGNOSIS — R7303 Prediabetes: Secondary | ICD-10-CM

## 2017-12-20 DIAGNOSIS — Z9189 Other specified personal risk factors, not elsewhere classified: Secondary | ICD-10-CM | POA: Diagnosis not present

## 2017-12-20 DIAGNOSIS — Z6841 Body Mass Index (BMI) 40.0 and over, adult: Secondary | ICD-10-CM

## 2017-12-20 MED ORDER — METFORMIN HCL 500 MG PO TABS: 500.0000 mg | ORAL_TABLET | Freq: Every day | ORAL | 0 refills | Status: DC

## 2017-12-20 NOTE — Progress Notes (Signed)
Office: [phone]  /  Fax: [phone]   HPI:   Chief Complaint: OBESITY Roberta [name] is here to discuss her progress with her obesity treatment plan. She is on the Category 2 plan and is following her eating plan approximately 85-90 % of the time. She states she is doing cardio for 30 minutes 4 times per week. Roberta [name] had a few indulgent days with chicken wings and wine. She is shocked to see she lost 4 lbs. She has a holiday party on Friday that she is hosting and family is coming over for Christmas.  Her weight is 273 lb (123.8 kg) today and has had a weight loss of 4 pounds over a period of 2 weeks since her last visit. She has lost 19 lbs since starting treatment with [place].  Pre-Diabetes Roberta [name] has a diagnosis of pre-diabetes based on her elevated Hgb A1c and was informed this puts her at greater risk of developing diabetes. She notes occasional carbohydrate cravings with wine, and denies GI side effects of metformin. She continues to work on diet and exercise to decrease risk of diabetes. She denies hypoglycemia.  At risk for diabetes Roberta [name] is at higher than average risk for developing diabetes due to her obesity and pre-diabetes. She currently denies polyuria or polydipsia.  Hypertension Roberta [name] is a 52 y.o. female with hypertension. Roberta [name]'s blood pressure is controlled today. She denies chest pain, chest pressure, or headaches. She is working weight loss to help control her blood pressure with the goal of decreasing her risk of heart attack and stroke.   ASSESSMENT AND PLAN:  Prediabetes - Plan: metFORMIN (GLUCOPHAGE) 500 MG tablet  Essential hypertension  At risk for diabetes mellitus  Class 3 severe obesity with serious comorbidity and body mass index (BMI) of 40.0 to 44.9 in adult, unspecified obesity type ([place])  PLAN:  Pre-Diabetes Roberta [name] will continue to work on weight loss, exercise, and decreasing simple carbohydrates in her diet to help decrease the risk of diabetes.  We dicussed metformin including benefits and risks. She was informed that eating too many simple carbohydrates or too many calories at one sitting increases the likelihood of GI side effects. Roberta [name] agrees to continue taking metformin 500 mg PO q AM #30 and we will refill for 1 month. Roberta [name] agrees to follow up with our clinic in 2 weeks as directed to monitor her progress.  Diabetes risk counselling Roberta [name] was given extended (15 minutes) diabetes prevention counseling today. She is 52 y.o. female and has risk factors for diabetes including obesity and pre-diabetes. We discussed intensive lifestyle modifications today with an emphasis on weight loss as well as increasing exercise and decreasing simple carbohydrates in her diet.  Hypertension We discussed sodium restriction, working on healthy weight loss, and a regular exercise program as the means to achieve improved blood pressure control. Roberta [name] agreed with this plan and agreed to follow up as directed. We will continue to monitor her blood pressure as well as her progress with the above lifestyle modifications. Roberta [name] agrees to continue taking Hyzaar and will watch for signs of hypotension as she continues her lifestyle modifications. Roberta [name] agrees to follow up with our clinic in 2 weeks.  Obesity Roberta [name] is currently in the action stage of change. As such, her goal is to continue with weight loss efforts She has agreed to follow the Category 2 plan Roberta [name] has been instructed to work up to a goal of 150 minutes of combined cardio and strengthening exercise per week for weight  loss and overall health benefits. We discussed the following Behavioral Modification Strategies today: increasing lean protein intake, work on meal planning and easy cooking plans, holiday eating strategies, celebration eating strategies, and planning for success   Roberta [name] has agreed to follow up with our clinic in 2 weeks. She was informed of the importance of frequent follow up  visits to maximize her success with intensive lifestyle modifications for her multiple health conditions.  ALLERGIES: Allergies  Allergen Reactions  . Lisinopril Swelling    Swelling of the tongue  . Penicillins     Pt told as a child-unknown reaction.  . Tetanus Toxoids Swelling    Swelling at site of injection  . Norvasc [Amlodipine Besylate] Rash    MEDICATIONS: Current Outpatient Medications on File Prior to Visit  Medication Sig Dispense Refill  . Black Cohosh 40 MG CAPS Take 1 capsule by mouth daily.    . Cholecalciferol (VITAMIN D-3) 5000 units TABS Take 1 tablet by mouth daily. 30 tablet   . losartan-hydrochlorothiazide (HYZAAR) 100-25 MG tablet TAKE 1 TABLET BY MOUTH EVERY DAY 90 tablet 1  . Multiple Vitamin (MULTIVITAMIN) tablet Take 1 tablet by mouth daily.    . norethindrone (MICRONOR,CAMILA,ERRIN) 0.35 MG tablet Take 1 tablet by mouth daily.     No current facility-administered medications on file prior to visit.     PAST MEDICAL HISTORY: Past Medical History:  Diagnosis Date  . Back pain   . Hip pain   . Hypertension   . Obesity   . Prediabetes   . Swelling    bilat LE    PAST SURGICAL HISTORY: Past Surgical History:  Procedure Laterality Date  . CHOLECYSTECTOMY    . GASTRIC BYPASS    . KNEE SURGERY  2002   Ligament repair    SOCIAL HISTORY: Social History   Tobacco Use  . Smoking status: Never Smoker  . Smokeless tobacco: Never Used  Substance Use Topics  . Alcohol use: No  . Drug use: No    FAMILY HISTORY: Family History  Problem Relation Age of Onset  . Diabetes Mother   . Hypertension Mother   . Heart disease Mother   . Stroke Mother   . Kidney disease Mother   . Obesity Mother   . Cancer Maternal Aunt        lung  . Cancer Maternal Uncle        lung    ROS: Review of Systems  Constitutional: Positive for weight loss.  Cardiovascular: Negative for chest pain.       Negative chest pressure  Genitourinary: Negative for  frequency.  Neurological: Negative for headaches.  Endo/Heme/Allergies: Negative for polydipsia.       Negative hypoglycemia    PHYSICAL EXAM: Blood pressure 107/73, pulse 70, temperature 97.7 F (36.5 C), temperature source Oral, height 5\' 6"  (1.676 m), weight 273 lb (123.8 kg), SpO2 100 %. Body mass index is 44.06 kg/m. Physical Exam Vitals signs reviewed.  Constitutional:      Appearance: Normal appearance. She is obese.  Cardiovascular:     Rate and Rhythm: Normal rate.     Pulses: Normal pulses.  Pulmonary:     Effort: Pulmonary effort is normal.  Musculoskeletal: Normal range of motion.  Skin:    General: Skin is warm and dry.  Neurological:     Mental Status: She is alert and oriented to person, place, and time.  Psychiatric:        Mood and Affect: Mood normal.  Behavior: Behavior normal.     RECENT LABS AND TESTS: BMET    Component Value Date/Time   NA 139 10/08/2017 1239   K 3.8 10/08/2017 1239   CL 97 10/08/2017 1239   CO2 26 10/08/2017 1239   GLUCOSE 88 10/08/2017 1239   GLUCOSE 92 08/28/2017 1623   BUN 13 10/08/2017 1239   CREATININE 0.97 10/08/2017 1239   CALCIUM 9.1 10/08/2017 1239   GFRNONAA 66 10/08/2017 1239   GFRAA 77 10/08/2017 1239   Lab Results  Component Value Date   HGBA1C 6.0 (H) 10/08/2017   HGBA1C 6.3 06/25/2017   HGBA1C 6.3 01/22/2017   HGBA1C 6.0 07/11/2016   HGBA1C 6.2 12/13/2015   Lab Results  Component Value Date   INSULIN 10.6 10/08/2017   CBC    Component Value Date/Time   WBC 6.9 10/08/2017 1239   WBC 6.0 06/25/2017 1541   RBC 4.66 10/08/2017 1239   RBC 4.70 06/25/2017 1541   HGB 12.3 10/08/2017 1239   HCT 37.2 10/08/2017 1239   PLT 262.0 06/25/2017 1541   MCV 80 10/08/2017 1239   MCH 26.4 (L) 10/08/2017 1239   MCHC 33.1 10/08/2017 1239   MCHC 32.5 06/25/2017 1541   RDW 15.1 10/08/2017 1239   LYMPHSABS 1.6 10/08/2017 1239   MONOABS 0.5 06/25/2017 1541   EOSABS 0.2 10/08/2017 1239   BASOSABS 0.0  10/08/2017 1239   Iron/TIBC/Ferritin/ %Sat    Component Value Date/Time   IRON 162 (H) 01/22/2017 1152   FERRITIN 14.5 01/22/2017 1152   Lipid Panel     Component Value Date/Time   CHOL 112 10/08/2017 1239   TRIG 69 10/08/2017 1239   HDL 50 10/08/2017 1239   CHOLHDL 2 06/25/2017 1541   VLDL 15.0 06/25/2017 1541   LDLCALC 48 10/08/2017 1239   Hepatic Function Panel     Component Value Date/Time   PROT 7.2 10/08/2017 1239   ALBUMIN 3.5 10/08/2017 1239   AST 17 10/08/2017 1239   ALT 9 10/08/2017 1239   ALKPHOS 88 10/08/2017 1239   BILITOT 0.4 10/08/2017 1239      Component Value Date/Time   TSH 1.480 10/08/2017 1239   TSH 1.24 06/25/2017 1541   TSH 1.53 07/11/2016 1150      OBESITY BEHAVIORAL INTERVENTION VISIT  Today's visit was # 6   Starting weight: 292 lbs Starting date: 10/08/17 Today's weight : 273 lbs  Today's date: 12/20/2017 Total lbs lost to date: 36    ASK: We discussed the diagnosis of obesity with Roberta [name] today and Roberta [name] agreed to give [place] permission to discuss obesity behavioral modification therapy today.  ASSESS: Roberta [name] has the diagnosis of obesity and her BMI today is 44.08 Roberta [name] is in the action stage of change   ADVISE: Roberta [name] was educated on the multiple health risks of obesity as well as the benefit of weight loss to improve her health. She was advised of the need for long term treatment and the importance of lifestyle modifications to improve her current health and to decrease her risk of future health problems.  AGREE: Multiple dietary modification options and treatment options were discussed and  Roberta [name] agreed to follow the recommendations documented in the above note.  ARRANGE: Roberta [name] was educated on the importance of frequent visits to treat obesity as outlined per CMS and USPSTF guidelines and agreed to schedule her next follow up appointment today.  Roberta [name], am acting as transcriptionist for Ilene Qua,  MD  I have reviewed the  above documentation for accuracy and completeness, and I agree with the above. - Ilene Qua, MD

## 2017-12-24 ENCOUNTER — Telehealth (INDEPENDENT_AMBULATORY_CARE_PROVIDER_SITE_OTHER): Payer: Self-pay | Admitting: Family Medicine

## 2017-12-24 NOTE — Telephone Encounter (Signed)
Spoke with the patient and informed her the prescription was at her Pharmacy on hold and they are now going to get it ready for pick up. Ottis Vacha, [place]

## 2017-12-24 NOTE — Telephone Encounter (Signed)
Patient was seen last week and states a refill of Metformin was to be sent to [facility] on [address].  She states this is the 2nd month the refill was to be sent but wasn't and she has had to call to request again.  States she will be out by the end of the week.   Thank you

## 2017-12-28 ENCOUNTER — Ambulatory Visit: Payer: BC Managed Care – PPO | Admitting: Internal Medicine

## 2017-12-31 NOTE — Progress Notes (Signed)
Subjective:    Patient ID: Roberta [name], female    DOB: June 11, 1965, 52 y.o.   MRN: [id number]  HPI The patient is here for follow up.  Hypertension: She is taking her medication daily. She is compliant with a low sodium diet.  She denies chest pain, palpitations, daily edema, shortness of breath and regular headaches. She is exercising regularly.     Prediabetes:  She is compliant with a low sugar/carbohydrate diet.  She is exercising regularly.  Obesity:  She is following with the weight management center.  She has lost weight.  Sh eis eating much better.     Medications and allergies reviewed with patient and updated if appropriate.  Patient Active Problem List   Diagnosis Date Noted  . Muscle spasm 08/28/2017  . Weight gain 06/25/2017  . 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 Left hip pain 02/10/2017  . Anemia 01/21/2017  . Acute back pain 06/19/2016  . 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 Prediabetes 12/13/2015  . Obesity 12/13/2015  . Sensation of lump in throat 12/13/2015  . Leg edema 12/13/2015  . Dense breasts 12/13/2011  . 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 Hypertension 11/10/2011    Current Outpatient Medications on File Prior to Visit  Medication Sig Dispense Refill  . Black Cohosh 40 MG CAPS Take 1 capsule by mouth daily.    . Cholecalciferol (VITAMIN D-3) 5000 units TABS Take 1 tablet by mouth daily. 30 tablet   . losartan-hydrochlorothiazide (HYZAAR) 100-25 MG tablet TAKE 1 TABLET BY MOUTH EVERY DAY 90 tablet 1  . metFORMIN (GLUCOPHAGE) 500 MG tablet Take 1 tablet (500 mg total) by mouth daily with breakfast. 30 tablet 0  . Multiple Vitamin (MULTIVITAMIN) tablet Take 1 tablet by mouth daily.    . norethindrone (MICRONOR,CAMILA,ERRIN) 0.35 MG tablet Take 1 tablet by mouth daily.     No current facility-administered medications on file prior to visit.     Past Medical History:  Diagnosis Date  . Back pain   . Hip pain   . Hypertension   .  Obesity   . Prediabetes   . Swelling    bilat LE    Past Surgical History:  Procedure Laterality Date  . CHOLECYSTECTOMY    . GASTRIC BYPASS    . KNEE SURGERY  2002   Ligament repair    Social History   Socioeconomic History  . Marital status: Legally Separated    Spouse name: Not on file  . Number of children: Not on file  . Years of education: Not on file  . Highest education level: Not on file  Occupational History  . Not on file  Social Needs  . Financial resource strain: Not on file  . Food insecurity:    Worry: Not on file    Inability: Not on file  . Transportation needs:    Medical: Not on file    Non-medical: Not on file  Tobacco Use  . Smoking status: Never Smoker  . Smokeless tobacco: Never Used  Substance and Sexual Activity  . Alcohol use: No  . Drug use: No  . Sexual activity: Not on file  Lifestyle  . Physical activity:    Days per week: Not on file    Minutes per session: Not on file  . Stress: Not on file  Relationships  . Social connections:    Talks on phone: Not on file    Gets together: Not on file  Attends religious service: Not on file    Active member of club or organization: Not on file    Attends meetings of clubs or organizations: Not on file    Relationship status: Not on file  Other Topics Concern  . Not on file  Social History Narrative  . Not on file    Family History  Problem Relation Age of Onset  . Diabetes Mother   . Hypertension Mother   . Heart disease Mother   . Stroke Mother   . Kidney disease Mother   . Obesity Mother   . Cancer Maternal Aunt        lung  . Cancer Maternal Uncle        lung    Review of Systems  Constitutional: Positive for diaphoresis (night sweats). Negative for chills and fever.  Respiratory: Negative for cough, shortness of breath and wheezing.   Cardiovascular: Positive for leg swelling (occ - less often). Negative for chest pain and palpitations.  Neurological: Positive for  light-headedness (with sauna). Negative for headaches.       Objective:   Vitals:   01/01/18 1054  BP: 124/72  Pulse: 74  Resp: 18  Temp: 98.3 F (36.8 C)  SpO2: 97%   BP Readings from Last 3 Encounters:  01/01/18 124/72  12/20/17 107/73  12/06/17 119/80   Wt Readings from Last 3 Encounters:  01/01/18 273 lb (123.8 kg)  12/20/17 273 lb (123.8 kg)  12/06/17 277 lb (125.6 kg)   Body mass index is 44.06 kg/m.   Physical Exam    Constitutional: Appears well-developed and well-nourished. No distress.  HENT:  Head: Normocephalic and atraumatic.  Neck: Neck supple. No tracheal deviation present. No thyromegaly present.  No cervical lymphadenopathy Cardiovascular: Normal rate, regular rhythm and normal heart sounds.   No murmur heard. No carotid bruit .  No edema Pulmonary/Chest: Effort normal and breath sounds normal. No respiratory distress. No has no wheezes. No rales.  Skin: Skin is warm and dry. Not diaphoretic.  Psychiatric: Normal mood and affect. Behavior is normal.      Assessment & Plan:    See Problem List for Assessment and Plan of chronic medical problems.

## 2017-12-31 NOTE — Patient Instructions (Addendum)
  Medications reviewed and updated.  Changes include :   none    Please followup in 6 months   

## 2018-01-01 ENCOUNTER — Ambulatory Visit: Payer: BC Managed Care – PPO | Admitting: Internal Medicine

## 2018-01-01 ENCOUNTER — Encounter: Payer: Self-pay | Admitting: Internal Medicine

## 2018-01-01 VITALS — BP 124/72 | HR 74 | Temp 98.3°F | Resp 18 | Ht 66.0 in | Wt 273.0 lb

## 2018-01-01 DIAGNOSIS — Z6841 Body Mass Index (BMI) 40.0 and over, adult: Secondary | ICD-10-CM | POA: Diagnosis not present

## 2018-01-01 DIAGNOSIS — I10 Essential (primary) hypertension: Secondary | ICD-10-CM

## 2018-01-01 DIAGNOSIS — R7303 Prediabetes: Secondary | ICD-10-CM | POA: Diagnosis not present

## 2018-01-01 MED ORDER — LOSARTAN POTASSIUM-HCTZ 100-25 MG PO TABS: 1.0000 | ORAL_TABLET | Freq: Every day | ORAL | 1 refills | Status: DC

## 2018-01-01 NOTE — Assessment & Plan Note (Signed)
BP well controlled Current regimen effective and well tolerated Continue current medications at current doses  

## 2018-01-01 NOTE — Assessment & Plan Note (Signed)
Check a1c Low sugar / carb diet Stressed regular exercise,  Continue weight loss efforts

## 2018-01-01 NOTE — Assessment & Plan Note (Signed)
Following with the weight management center and is losing weight Taking metformin Has lost 19 lbs

## 2018-01-01 NOTE — Addendum Note (Signed)
Addended by: [name] on: 01/01/2018 11:40 AM   Modules accepted: Orders

## 2018-01-10 ENCOUNTER — Ambulatory Visit (INDEPENDENT_AMBULATORY_CARE_PROVIDER_SITE_OTHER): Payer: BC Managed Care – PPO | Admitting: Family Medicine

## 2018-01-10 ENCOUNTER — Encounter (INDEPENDENT_AMBULATORY_CARE_PROVIDER_SITE_OTHER): Payer: Self-pay | Admitting: Family Medicine

## 2018-01-10 VITALS — BP 104/71 | HR 80 | Temp 98.0°F | Ht 66.0 in | Wt 270.0 lb

## 2018-01-10 DIAGNOSIS — L03818 Cellulitis of other sites: Secondary | ICD-10-CM

## 2018-01-10 DIAGNOSIS — I10 Essential (primary) hypertension: Secondary | ICD-10-CM

## 2018-01-10 DIAGNOSIS — Z6841 Body Mass Index (BMI) 40.0 and over, adult: Secondary | ICD-10-CM

## 2018-01-10 DIAGNOSIS — R7303 Prediabetes: Secondary | ICD-10-CM | POA: Diagnosis not present

## 2018-01-10 DIAGNOSIS — Z9189 Other specified personal risk factors, not elsewhere classified: Secondary | ICD-10-CM | POA: Diagnosis not present

## 2018-01-10 MED ORDER — CEPHALEXIN 500 MG PO CAPS: 500.0000 mg | ORAL_CAPSULE | Freq: Four times a day (QID) | ORAL | 0 refills | Status: DC

## 2018-01-12 NOTE — Progress Notes (Signed)
Office: [phone]  /  Fax: [phone]   HPI:   Chief Complaint: OBESITY Roberta [name] is here to discuss her progress with her obesity treatment plan. She is on the Category 2 plan and is following her eating plan approximately 95 % of the time. She states she is going to [employer] for 30-45 minutes 3-4 times per week. Roberta [name] had quiet holidays with time to herself. She has had a few excellent workout days. She noticed she is hungry right at lunch time. She is still liking the meal plan.  Her weight is 270 lb (122.5 kg) today and has had a weight loss of 3 pounds over a period of 3 weeks since her last visit. She has lost 22 lbs since starting treatment with [place].  Pre-Diabetes Roberta [name] has a diagnosis of pre-diabetes based on her elevated Hgb A1c and was informed this puts her at greater risk of developing diabetes. She is taking metformin currently and continues to work on diet and exercise to decrease risk of diabetes. She denies nausea or hypoglycemia.  At risk for diabetes Roberta [name] is at higher than average risk for developing diabetes due to her obesity and pre-diabetes. She currently denies polyuria or polydipsia.  Cellulitis, Nonpurlent Roberta [name] denies fever or signs of systemic infection. She has no history of MRSA.  Hypertension Roberta RENNAKER is a 53 y.o. female with hypertension. [name]'s blood pressure is well controlled. She denies dizziness or lightheadedness. She is working weight loss to help control her blood pressure with the goal of decreasing her risk of heart attack and stroke.  ASSESSMENT AND PLAN:  Prediabetes  Cellulitis of other specified site - Nonpurlent - Plan: cephALEXin (KEFLEX) 500 MG capsule  Essential hypertension  At risk for diabetes mellitus  Class 3 severe obesity with serious comorbidity and body mass index (BMI) of 40.0 to 44.9 in adult, unspecified obesity type ([place])  PLAN:  Pre-Diabetes Roberta [name] will continue to work on weight loss, exercise, and  decreasing simple carbohydrates in her diet to help decrease the risk of diabetes. We dicussed metformin including benefits and risks. She was informed that eating too many simple carbohydrates or too many calories at one sitting increases the likelihood of GI side effects. Roberta [name] agrees to continue taking metformin, and she agrees to follow up with our clinic in 2 weeks as directed to monitor her progress.  Diabetes risk counselling Roberta [name] was given extended (15 minutes) diabetes prevention counseling today. She is 53 y.o. female and has risk factors for diabetes including obesity and pre-diabetes. We discussed intensive lifestyle modifications today with an emphasis on weight loss as well as increasing exercise and decreasing simple carbohydrates in her diet.  Cellulitis, Nonpurlent Roberta [name] agrees to start Keflex 500 mg PO QID #20 with no refills. Roberta [name] agrees to follow up with our clinic in 2 weeks.  Hypertension We discussed sodium restriction, working on healthy weight loss, and a regular exercise program as the means to achieve improved blood pressure control. Roberta [name] agreed with this plan and agreed to follow up as directed. We will continue to monitor her blood pressure as well as her progress with the above lifestyle modifications. Roberta [name] agrees to continue her current medications and will watch for signs of hypotension as she continues her lifestyle modifications. Roberta [name] agrees to follow up with our clinic in 2 weeks.  Obesity Roberta [name] is currently in the action stage of change. As such, her goal is to continue with weight loss efforts She has agreed to follow the  Category 2 plan + 100 calories Roberta [name] has been instructed to work up to a goal of 150 minutes of combined cardio and strengthening exercise per week for weight loss and overall health benefits. We discussed the following Behavioral Modification Strategies today: increasing lean protein intake, increasing vegetables, work on meal planning and  easy cooking plans, and planning for success   Roberta [name] has agreed to follow up with our clinic in 2 weeks. She was informed of the importance of frequent follow up visits to maximize her success with intensive lifestyle modifications for her multiple health conditions.  ALLERGIES: Allergies  Allergen Reactions  . Lisinopril Swelling    Swelling of the tongue  . Penicillins     Pt told as a child-unknown reaction.  . Tetanus Toxoids Swelling    Swelling at site of injection  . Norvasc [Amlodipine Besylate] Rash    MEDICATIONS: Current Outpatient Medications on File Prior to Visit  Medication Sig Dispense Refill  . Black Cohosh 40 MG CAPS Take 1 capsule by mouth daily.    . Cholecalciferol (VITAMIN D-3) 5000 units TABS Take 1 tablet by mouth daily. 30 tablet   . losartan-hydrochlorothiazide (HYZAAR) 100-25 MG tablet Take 1 tablet by mouth daily. 90 tablet 1  . metFORMIN (GLUCOPHAGE) 500 MG tablet Take 1 tablet (500 mg total) by mouth daily with breakfast. 30 tablet 0  . Multiple Vitamin (MULTIVITAMIN) tablet Take 1 tablet by mouth daily.    . norethindrone (MICRONOR,CAMILA,ERRIN) 0.35 MG tablet Take 1 tablet by mouth daily.     No current facility-administered medications on file prior to visit.     PAST MEDICAL HISTORY: Past Medical History:  Diagnosis Date  . Back pain   . Hip pain   . Hypertension   . Obesity   . Prediabetes   . Swelling    bilat LE    PAST SURGICAL HISTORY: Past Surgical History:  Procedure Laterality Date  . CHOLECYSTECTOMY    . GASTRIC BYPASS    . KNEE SURGERY  2002   Ligament repair    SOCIAL HISTORY: Social History   Tobacco Use  . Smoking status: Never Smoker  . Smokeless tobacco: Never Used  Substance Use Topics  . Alcohol use: No  . Drug use: No    FAMILY HISTORY: Family History  Problem Relation Age of Onset  . Diabetes Mother   . Hypertension Mother   . Heart disease Mother   . Stroke Mother   . Kidney disease Mother   .  Obesity Mother   . Cancer Maternal Aunt        lung  . Cancer Maternal Uncle        lung    ROS: Review of Systems  Constitutional: Positive for weight loss. Negative for fever.  Gastrointestinal: Negative for nausea.  Genitourinary: Negative for frequency.  Neurological: Negative for dizziness.       Negative lightheadedness  Endo/Heme/Allergies: Negative for polydipsia.       Negative hypoglycemia    PHYSICAL EXAM: Blood pressure 104/71, pulse 80, temperature 98 F (36.7 C), temperature source Oral, height 5\' 6"  (1.676 m), weight 270 lb (122.5 kg), SpO2 99 %. Body mass index is 43.58 kg/m. Physical Exam Vitals signs reviewed.  Constitutional:      Appearance: Normal appearance. She is obese.  Cardiovascular:     Rate and Rhythm: Normal rate.     Pulses: Normal pulses.  Pulmonary:     Effort: Pulmonary effort is normal.  Breath sounds: Normal breath sounds.  Musculoskeletal: Normal range of motion.  Skin:    General: Skin is warm and dry.  Neurological:     Mental Status: She is alert and oriented to person, place, and time.  Psychiatric:        Mood and Affect: Mood normal.        Behavior: Behavior normal.     RECENT LABS AND TESTS: BMET    Component Value Date/Time   NA 139 10/08/2017 1239   K 3.8 10/08/2017 1239   CL 97 10/08/2017 1239   CO2 26 10/08/2017 1239   GLUCOSE 88 10/08/2017 1239   GLUCOSE 92 08/28/2017 1623   BUN 13 10/08/2017 1239   CREATININE 0.97 10/08/2017 1239   CALCIUM 9.1 10/08/2017 1239   GFRNONAA 66 10/08/2017 1239   GFRAA 77 10/08/2017 1239   Lab Results  Component Value Date   HGBA1C 6.0 (H) 10/08/2017   HGBA1C 6.3 06/25/2017   HGBA1C 6.3 01/22/2017   HGBA1C 6.0 07/11/2016   HGBA1C 6.2 12/13/2015   Lab Results  Component Value Date   INSULIN 10.6 10/08/2017   CBC    Component Value Date/Time   WBC 6.9 10/08/2017 1239   WBC 6.0 06/25/2017 1541   RBC 4.66 10/08/2017 1239   RBC 4.70 06/25/2017 1541   HGB 12.3  10/08/2017 1239   HCT 37.2 10/08/2017 1239   PLT 262.0 06/25/2017 1541   MCV 80 10/08/2017 1239   MCH 26.4 (L) 10/08/2017 1239   MCHC 33.1 10/08/2017 1239   MCHC 32.5 06/25/2017 1541   RDW 15.1 10/08/2017 1239   LYMPHSABS 1.6 10/08/2017 1239   MONOABS 0.5 06/25/2017 1541   EOSABS 0.2 10/08/2017 1239   BASOSABS 0.0 10/08/2017 1239   Iron/TIBC/Ferritin/ %Sat    Component Value Date/Time   IRON 162 (H) 01/22/2017 1152   FERRITIN 14.5 01/22/2017 1152   Lipid Panel     Component Value Date/Time   CHOL 112 10/08/2017 1239   TRIG 69 10/08/2017 1239   HDL 50 10/08/2017 1239   CHOLHDL 2 06/25/2017 1541   VLDL 15.0 06/25/2017 1541   LDLCALC 48 10/08/2017 1239   Hepatic Function Panel     Component Value Date/Time   PROT 7.2 10/08/2017 1239   ALBUMIN 3.5 10/08/2017 1239   AST 17 10/08/2017 1239   ALT 9 10/08/2017 1239   ALKPHOS 88 10/08/2017 1239   BILITOT 0.4 10/08/2017 1239      Component Value Date/Time   TSH 1.480 10/08/2017 1239   TSH 1.24 06/25/2017 1541   TSH 1.53 07/11/2016 1150      OBESITY BEHAVIORAL INTERVENTION VISIT  Today's visit was # 7   Starting weight: 292 lbs Starting date: 10/08/17 Today's weight : 270 lbs  Today's date: 01/10/2018 Total lbs lost to date: 23    ASK: We discussed the diagnosis of obesity with Jonnie Kind today and [name] agreed to give [place] permission to discuss obesity behavioral modification therapy today.  ASSESS: Kristelle has the diagnosis of obesity and her BMI today is 43.6 Rosa is in the action stage of change   ADVISE: Canisha was educated on the multiple health risks of obesity as well as the benefit of weight loss to improve her health. She was advised of the need for long term treatment and the importance of lifestyle modifications to improve her current health and to decrease her risk of future health problems.  AGREE: Multiple dietary modification options and treatment options were discussed and  Emerlyn agreed to  follow the recommendations documented in the above note.  ARRANGE: Librada was educated on the importance of frequent visits to treat obesity as outlined per CMS and USPSTF guidelines and agreed to schedule her next follow up appointment today.  I, Trixie Dredge, am acting as transcriptionist for Ilene Qua, MD  I have reviewed the above documentation for accuracy and completeness, and I agree with the above. - Ilene Qua, MD

## 2018-01-17 ENCOUNTER — Ambulatory Visit: Payer: BC Managed Care – PPO | Admitting: Internal Medicine

## 2018-01-17 ENCOUNTER — Encounter: Payer: Self-pay | Admitting: Internal Medicine

## 2018-01-17 VITALS — BP 126/84 | HR 64 | Temp 97.8°F | Resp 16 | Ht 66.0 in | Wt 273.8 lb

## 2018-01-17 DIAGNOSIS — L509 Urticaria, unspecified: Secondary | ICD-10-CM | POA: Diagnosis not present

## 2018-01-17 MED ORDER — METHYLPREDNISOLONE 4 MG PO TBPK: ORAL_TABLET | ORAL | 0 refills | Status: DC

## 2018-01-17 NOTE — Patient Instructions (Addendum)
Take zyrtec 10 mg at night for two weeks.   Take the medrol pak as prescribed   Call if no improvement     Hives Hives (urticaria) are itchy, red, swollen areas on the skin. Hives can appear on any part of the body. Hives often fade within 24 hours (acute hives). Sometimes, new hives appear after old ones fade and the cycle can continue for several days or weeks (chronic hives). Hives do not spread from person to person (are not contagious). Hives come from the body's reaction to something a person is allergic to (allergen), something that causes irritation, or various other triggers. When a person is exposed to a trigger, his or her body releases a chemical (histamine) that causes redness, itching, and swelling. Hives can appear right after exposure to a trigger or hours later. What are the causes? This condition may be caused by:  Allergies to foods or ingredients.  Insect bites or stings.  Exposure to pollen or pets.  Contact with latex or chemicals.  Spending time in sunlight, heat, or cold (exposure).  Exercise.  Stress.  Certain medicines. You can also get hives from other medical conditions and treatments, such as:  Viruses, including the common cold.  Bacterial infections, such as urinary tract infections and strep throat.  Certain medicines.  Allergy shots.  Blood transfusions. Sometimes, the cause of this condition is not known (idiopathic hives). What increases the risk? You are more likely to develop this condition if you:  Are a woman.  Have food allergies, especially to citrus fruits, milk, eggs, peanuts, tree nuts, or shellfish.  Are allergic to: ? Medicines. ? Latex. ? Insects. ? Animals. ? Pollen. What are the signs or symptoms? Common symptoms of this condition include raised, itchy, red or white bumps or patches on your skin. These areas may:  Become large and swollen (welts).  Change in shape and location, quickly and repeatedly.  Be  separate hives or connect over a large area of skin.  Sting or become painful.  Turn white when pressed in the center (blanch). In severe cases, yourhands, feet, and face may also become swollen. This may occur if hives develop deeper in your skin. How is this diagnosed? This condition may be diagnosed by your symptoms, medical history, and physical exam.  Your skin, urine, or blood may be tested to find out what is causing your hives and to rule out other health issues.  Your health care provider may also remove a small sample of skin from the affected area and examine it under a microscope (biopsy). How is this treated? Treatment for this condition depends on the cause and severity of your symptoms. Your health care provider may recommend using cool, wet cloths (cool compresses) or taking cool showers to relieve itching. Treatment may include:  Medicines that help: ? Relieve itching (antihistamines). ? Reduce swelling (corticosteroids). ? Treat infection (antibiotics).  An injectable medicine (omalizumab). Your health care provider may prescribe this if you have chronic idiopathic hives and you continue to have symptoms even after treatment with antihistamines. Severe cases may require an emergency injection of adrenaline (epinephrine) to prevent a life-threatening allergic reaction (anaphylaxis). Follow these instructions at home: Medicines  Take and apply over-the-counter and prescription medicines only as told by your health care provider.  If you were prescribed an antibiotic medicine, take it as told by your health care provider. Do not stop using the antibiotic even if you start to feel better. Skin care  Apply cool  compresses to the affected areas.  Do not scratch or rub your skin. General instructions  Do not take hot showers or baths. This can make itching worse.  Do not wear tight-fitting clothing.  Use sunscreen and wear protective clothing when you are  outside.  Avoid any substances that cause your hives. Keep a journal to help track what causes your hives. Write down: ? What medicines you take. ? What you eat and drink. ? What products you use on your skin.  Keep all follow-up visits as told by your health care provider. This is important. Contact a health care provider if:  Your symptoms are not controlled with medicine.  Your joints are painful or swollen. Get help right away if:  You have a fever.  You have pain in your abdomen.  Your tongue or lips are swollen.  Your eyelids are swollen.  Your chest or throat feels tight.  You have trouble breathing or swallowing. These symptoms may represent a serious problem that is an emergency. Do not wait to see if the symptoms will go away. Get medical help right away. Call your local emergency services (911 in the [place] Do not drive yourself to the hospital. Summary  Hives (urticaria) are itchy, red, swollen areas on your skin. Hives come from the body's reaction to something a person is allergic to (allergen), something that causes irritation, or various other triggers.  Treatment for this condition depends on the cause and severity of your symptoms.  Avoid any substances that cause your hives. Keep a journal to help track what causes your hives.  Take and apply over-the-counter and prescription medicines only as told by your health care provider.  Keep all follow-up visits as told by your health care provider. This is important. This information is not intended to replace advice given to you by your health care provider. Make sure you discuss any questions you have with your health care provider. Document Released: 12/19/2004 Document Revised: 07/04/2017 Document Reviewed: 07/04/2017 Elsevier Interactive Patient Education  2019 [employer].

## 2018-01-17 NOTE — Assessment & Plan Note (Signed)
Likely allergic in nature Likely from eating spicy Doritos-had similar reaction in the past when eating [employer]aki's, which is a similar [place] tree Discussed allergy referral to find out if it is a spice or the dye that she is allergic to-she deferred Start Zyrtec daily and take for least 2 weeks Medrol Dosepak Call if no improvement

## 2018-01-17 NOTE — Progress Notes (Signed)
Subjective:    Patient ID: Roberta [name], female    DOB: 1965-03-31, 53 y.o.   MRN: [id number]  HPI The patient is here for an acute visit.   Rash on arms: In the very beginning of this month she ate a spicy Doritos and she thinks that is the cause of the rash.  She had something similar when she had Taki's, which is a [place]  They are basically the same type of food.  Rash started in her left arm just recently she has it on her right arm.  She wonders if she is allergic to red dye.  On 1/9 she was at the weight management clinic and there was concern about it being possibly cellulitis and she was started on antibiotic-Keflex.  She has been taking it twice daily and has taken 7 days and there has been no improvement.  She just recently found a new lesion on her right arm.  There are no fevers or chills.  The rash is very itchy.  She is tried over-the-counter anti-itch creams without improvement.  She did take Benadryl, but she does not think it helped-just made her sleep.    Medications and allergies reviewed with patient and updated if appropriate.  Patient Active Problem List   Diagnosis Date Noted  . Muscle spasm 08/28/2017  . Weight gain 06/25/2017  . 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 Left hip pain 02/10/2017  . Anemia 01/21/2017  . Acute back pain 06/19/2016  . 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 Prediabetes 12/13/2015  . Obesity 12/13/2015  . Sensation of lump in throat 12/13/2015  . Leg edema 12/13/2015  . Dense breasts 12/13/2011  . 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 Hypertension 11/10/2011    Current Outpatient Medications on File Prior to Visit  Medication Sig Dispense Refill  . Black Cohosh 40 MG CAPS Take 1 capsule by mouth daily.    . Cholecalciferol (VITAMIN D-3) 5000 units TABS Take 1 tablet by mouth daily. 30 tablet   . losartan-hydrochlorothiazide (HYZAAR) 100-25 MG tablet Take 1 tablet by mouth daily. 90 tablet 1  . metFORMIN  (GLUCOPHAGE) 500 MG tablet Take 1 tablet (500 mg total) by mouth daily with breakfast. 30 tablet 0  . Multiple Vitamin (MULTIVITAMIN) tablet Take 1 tablet by mouth daily.    . norethindrone (MICRONOR,CAMILA,ERRIN) 0.35 MG tablet Take 1 tablet by mouth daily.     No current facility-administered medications on file prior to visit.     Past Medical History:  Diagnosis Date  . Back pain   . Hip pain   . Hypertension   . Obesity   . Prediabetes   . Swelling    bilat LE    Past Surgical History:  Procedure Laterality Date  . CHOLECYSTECTOMY    . GASTRIC BYPASS    . KNEE SURGERY  2002   Ligament repair    Social History   Socioeconomic History  . Marital status: Legally Separated    Spouse name: Not on file  . Number of children: Not on file  . Years of education: Not on file  . Highest education level: Not on file  Occupational History  . Not on file  Social Needs  . Financial resource strain: Not on file  . Food insecurity:    Worry: Not on file    Inability: Not on file  . Transportation needs:    Medical: Not on file    Non-medical: Not on  file  Tobacco Use  . Smoking status: Never Smoker  . Smokeless tobacco: Never Used  Substance and Sexual Activity  . Alcohol use: No  . Drug use: No  . Sexual activity: Not on file  Lifestyle  . Physical activity:    Days per week: Not on file    Minutes per session: Not on file  . Stress: Not on file  Relationships  . Social connections:    Talks on phone: Not on file    Gets together: Not on file    Attends religious service: Not on file    Active member of club or organization: Not on file    Attends meetings of clubs or organizations: Not on file    Relationship status: Not on file  Other Topics Concern  . Not on file  Social History Narrative  . Not on file    Family History  Problem Relation Age of Onset  . Diabetes Mother   . Hypertension Mother   . Heart disease Mother   . Stroke Mother   . Kidney  disease Mother   . Obesity Mother   . Cancer Maternal Aunt        lung  . Cancer Maternal Uncle        lung    Review of Systems  Constitutional: Negative for chills and fever.  Respiratory: Negative for cough, shortness of breath and wheezing.   Skin: Positive for rash (Itchy). Negative for wound.       Objective:   Vitals:   01/17/18 1101  BP: 126/84  Pulse: 64  Resp: 16  Temp: 97.8 F (36.6 C)  SpO2: 99%   BP Readings from Last 3 Encounters:  01/17/18 126/84  01/10/18 104/71  01/01/18 124/72   Wt Readings from Last 3 Encounters:  01/17/18 273 lb 12.8 oz (124.2 kg)  01/10/18 270 lb (122.5 kg)  01/01/18 273 lb (123.8 kg)   Body mass index is 44.19 kg/m.   Physical Exam Constitutional:      General: She is not in acute distress.    Appearance: Normal appearance. She is not ill-appearing.  Skin:    Findings: Rash (Hives on bilateral arms-none elsewhere, slightly warm to touch, no wounds or other skin abnormalities) present.  Neurological:     Mental Status: She is alert.            Assessment & Plan:    See Problem List for Assessment and Plan of chronic medical problems.

## 2018-01-30 ENCOUNTER — Ambulatory Visit (INDEPENDENT_AMBULATORY_CARE_PROVIDER_SITE_OTHER): Payer: BC Managed Care – PPO | Admitting: Family Medicine

## 2018-01-30 ENCOUNTER — Encounter (INDEPENDENT_AMBULATORY_CARE_PROVIDER_SITE_OTHER): Payer: Self-pay

## 2018-01-31 ENCOUNTER — Ambulatory Visit (INDEPENDENT_AMBULATORY_CARE_PROVIDER_SITE_OTHER): Payer: BC Managed Care – PPO | Admitting: Family Medicine

## 2018-01-31 VITALS — BP 101/60 | HR 74 | Temp 97.8°F | Ht 66.0 in | Wt 266.0 lb

## 2018-01-31 DIAGNOSIS — Z9189 Other specified personal risk factors, not elsewhere classified: Secondary | ICD-10-CM

## 2018-01-31 DIAGNOSIS — E559 Vitamin D deficiency, unspecified: Secondary | ICD-10-CM

## 2018-01-31 DIAGNOSIS — R7303 Prediabetes: Secondary | ICD-10-CM | POA: Diagnosis not present

## 2018-01-31 DIAGNOSIS — Z6841 Body Mass Index (BMI) 40.0 and over, adult: Secondary | ICD-10-CM

## 2018-01-31 MED ORDER — METFORMIN HCL 500 MG PO TABS: 500.0000 mg | ORAL_TABLET | Freq: Every day | ORAL | 0 refills | Status: DC

## 2018-02-01 LAB — COMPREHENSIVE METABOLIC PANEL
ALT: 17 IU/L (ref 0–32)
AST: 19 IU/L (ref 0–40)
Albumin/Globulin Ratio: 1 — ABNORMAL LOW (ref 1.2–2.2)
Albumin: 3.5 g/dL — ABNORMAL LOW (ref 3.8–4.9)
Alkaline Phosphatase: 84 IU/L (ref 39–117)
BUN/Creatinine Ratio: 17 (ref 9–23)
BUN: 16 mg/dL (ref 6–24)
Bilirubin Total: 0.4 mg/dL (ref 0.0–1.2)
CO2: 26 mmol/L (ref 20–29)
Calcium: 8.8 mg/dL (ref 8.7–10.2)
Chloride: 99 mmol/L (ref 96–106)
Creatinine, Ser: 0.95 mg/dL (ref 0.57–1.00)
GFR calc Af Amer: 79 mL/min/{1.73_m2} (ref >59)
GFR calc non Af Amer: 68 mL/min/{1.73_m2} (ref >59)
Globulin, Total: 3.4 g/dL (ref 1.5–4.5)
Glucose: 101 mg/dL — ABNORMAL HIGH (ref 65–99)
Potassium: 3.6 mmol/L (ref 3.5–5.2)
Sodium: 141 mmol/L (ref 134–144)
Total Protein: 6.9 g/dL (ref 6.0–8.5)

## 2018-02-01 LAB — INSULIN, RANDOM: INSULIN: 13.4 u[IU]/mL (ref 2.6–24.9)

## 2018-02-01 LAB — HEMOGLOBIN A1C
Est. average glucose Bld gHb Est-mCnc: 128 mg/dL
Hgb A1c MFr Bld: 6.1 % — ABNORMAL HIGH (ref 4.8–5.6)

## 2018-02-01 LAB — VITAMIN D 25 HYDROXY (VIT D DEFICIENCY, FRACTURES): Vit D, 25-Hydroxy: 61.3 ng/mL (ref 30.0–100.0)

## 2018-02-02 NOTE — Progress Notes (Signed)
Office: [phone]  /  Fax: [phone]   HPI:   Chief Complaint: OBESITY Roberta [name] is here to discuss her progress with her obesity treatment plan. She is on the Category 2 plan + 100 calories and is following her eating plan approximately 80-85% of the time. She states she is doing cardio and strengthening for 30-45 minutes 2-3 times per week. Roberta [name] has been on steroids for an allergic reaction to Dorito's recently. It caused increased hunger and she was slightly off the plan. She has had a recent death in the family. She is eating all protein and food on the plan.  Her weight is 266 lb (120.7 kg) today and has gained 4 pounds since her last visit. She has lost 26 lbs since starting treatment with [place].  Pre-Diabetes Roberta [name] has a diagnosis of pre-diabetes based on her elevated Hgb A1c and was informed this puts her at greater risk of developing diabetes. She is on metformin and notes polyphagia due to recent steroids. She continues to work on diet and exercise to decrease risk of diabetes. She denies hypoglycemia. Lab Results  Component Value Date   HGBA1C 6.1 (H) 01/31/2018    At risk for diabetes Roberta [name] is at higher than average risk for developing diabetes due to her obesity and pre-diabetes. She currently denies polyuria or polydipsia.  Vitamin D Deficiency [name] has a diagnosis of vitamin D deficiency. She is currently taking Vit D, but level is not at goal. She denies nausea, vomiting or muscle weakness.  ALLERGIES: Allergies  Allergen Reactions  . Lisinopril Swelling    Swelling of the tongue  . Penicillins     Pt told as a child-unknown reaction.  . Tetanus Toxoids Swelling    Swelling at site of injection  . Norvasc [Amlodipine Besylate] Rash    MEDICATIONS: Current Outpatient Medications on File Prior to Visit  Medication Sig Dispense Refill  . Black Cohosh 40 MG CAPS Take 1 capsule by mouth daily.    . Cholecalciferol (VITAMIN D-3) 5000 units TABS Take 1 tablet by  mouth daily. 30 tablet   . losartan-hydrochlorothiazide (HYZAAR) 100-25 MG tablet Take 1 tablet by mouth daily. 90 tablet 1  . methylPREDNISolone (MEDROL DOSEPAK) 4 MG TBPK tablet 24 mg PO on day 1, then decr. by 4 mg/day x5 days 21 tablet 0  . Multiple Vitamin (MULTIVITAMIN) tablet Take 1 tablet by mouth daily.    . norethindrone (MICRONOR,CAMILA,ERRIN) 0.35 MG tablet Take 1 tablet by mouth daily.     No current facility-administered medications on file prior to visit.     PAST MEDICAL HISTORY: Past Medical History:  Diagnosis Date  . Back pain   . Hip pain   . Hypertension   . Obesity   . Prediabetes   . Swelling    bilat LE    PAST SURGICAL HISTORY: Past Surgical History:  Procedure Laterality Date  . CHOLECYSTECTOMY    . GASTRIC BYPASS    . KNEE SURGERY  2002   Ligament repair    SOCIAL HISTORY: Social History   Tobacco Use  . Smoking status: Never Smoker  . Smokeless tobacco: Never Used  Substance Use Topics  . Alcohol use: No  . Drug use: No    FAMILY HISTORY: Family History  Problem Relation Age of Onset  . Diabetes Mother   . Hypertension Mother   . Heart disease Mother   . Stroke Mother   . Kidney disease Mother   . Obesity Mother   .  Cancer Maternal Aunt        lung  . Cancer Maternal Uncle        lung    ROS: Review of Systems  Constitutional: Negative for weight loss.  Gastrointestinal: Negative for nausea and vomiting.  Genitourinary: Negative for frequency.  Musculoskeletal:       Negative muscle weakness  Endo/Heme/Allergies: Negative for polydipsia.       Positive polyphagia Negative hypoglycemia    PHYSICAL EXAM: Blood pressure 101/60, pulse 74, temperature 97.8 F (36.6 C), temperature source Oral, height 5\' 6"  (1.676 m), weight 266 lb (120.7 kg), SpO2 99 %. Body mass index is 42.93 kg/m. Physical Exam Vitals signs reviewed.  Constitutional:      Appearance: Normal appearance. She is obese.  Cardiovascular:     Rate and  Rhythm: Normal rate.     Pulses: Normal pulses.  Pulmonary:     Effort: Pulmonary effort is normal.     Breath sounds: Normal breath sounds.  Musculoskeletal: Normal range of motion.  Skin:    General: Skin is warm and dry.  Neurological:     Mental Status: She is alert and oriented to person, place, and time.  Psychiatric:        Mood and Affect: Mood normal.        Behavior: Behavior normal.     RECENT LABS AND TESTS: BMET    Component Value Date/Time   NA 141 01/31/2018 0840   K 3.6 01/31/2018 0840   CL 99 01/31/2018 0840   CO2 26 01/31/2018 0840   GLUCOSE 101 (H) 01/31/2018 0840   GLUCOSE 92 08/28/2017 1623   BUN 16 01/31/2018 0840   CREATININE 0.95 01/31/2018 0840   CALCIUM 8.8 01/31/2018 0840   GFRNONAA 68 01/31/2018 0840   GFRAA 79 01/31/2018 0840   Lab Results  Component Value Date   HGBA1C 6.1 (H) 01/31/2018   HGBA1C 6.0 (H) 10/08/2017   HGBA1C 6.3 06/25/2017   HGBA1C 6.3 01/22/2017   HGBA1C 6.0 07/11/2016   Lab Results  Component Value Date   INSULIN 13.4 01/31/2018   INSULIN 10.6 10/08/2017   CBC    Component Value Date/Time   WBC 6.9 10/08/2017 1239   WBC 6.0 06/25/2017 1541   RBC 4.66 10/08/2017 1239   RBC 4.70 06/25/2017 1541   HGB 12.3 10/08/2017 1239   HCT 37.2 10/08/2017 1239   PLT 262.0 06/25/2017 1541   MCV 80 10/08/2017 1239   MCH 26.4 (L) 10/08/2017 1239   MCHC 33.1 10/08/2017 1239   MCHC 32.5 06/25/2017 1541   RDW 15.1 10/08/2017 1239   LYMPHSABS 1.6 10/08/2017 1239   MONOABS 0.5 06/25/2017 1541   EOSABS 0.2 10/08/2017 1239   BASOSABS 0.0 10/08/2017 1239   Iron/TIBC/Ferritin/ %Sat    Component Value Date/Time   IRON 162 (H) 01/22/2017 1152   FERRITIN 14.5 01/22/2017 1152   Lipid Panel     Component Value Date/Time   CHOL 112 10/08/2017 1239   TRIG 69 10/08/2017 1239   HDL 50 10/08/2017 1239   CHOLHDL 2 06/25/2017 1541   VLDL 15.0 06/25/2017 1541   LDLCALC 48 10/08/2017 1239   Hepatic Function Panel     Component  Value Date/Time   PROT 6.9 01/31/2018 0840   ALBUMIN 3.5 (L) 01/31/2018 0840   AST 19 01/31/2018 0840   ALT 17 01/31/2018 0840   ALKPHOS 84 01/31/2018 0840   BILITOT 0.4 01/31/2018 0840      Component Value Date/Time   TSH  1.480 10/08/2017 1239   TSH 1.24 06/25/2017 1541   TSH 1.53 07/11/2016 1150    ASSESSMENT AND PLAN: Prediabetes - Plan: Comprehensive metabolic panel, Hemoglobin A1c, Insulin, random, metFORMIN (GLUCOPHAGE) 500 MG tablet  Vitamin D deficiency - Plan: VITAMIN D 25 Hydroxy (Vit-D Deficiency, Fractures)  At risk for diabetes mellitus  Class 3 severe obesity with serious comorbidity and body mass index (BMI) of 40.0 to 44.9 in adult, unspecified obesity type ([place])  PLAN:  Pre-Diabetes Roberta [name] will continue to work on weight loss, exercise, and decreasing simple carbohydrates in her diet to help decrease the risk of diabetes.  Roberta [name] agrees to continue taking metformin 500 mg q AM #30 and we will refill for 1 month. We will check A1c, fasting insulin and glucose today. Roberta [name] agrees to follow up with our clinic in 2 weeks as directed to monitor her progress.  Diabetes risk counselling Roberta [name] was given extended (15 minutes) diabetes prevention counseling today. She is 53 y.o. female and has risk factors for diabetes including obesity and pre-diabetes. We discussed intensive lifestyle modifications today with an emphasis on weight loss as well as increasing exercise and decreasing simple carbohydrates in her diet.  Vitamin D Deficiency Roberta [name] was informed that low vitamin D levels contributes to fatigue and are associated with obesity, breast, and colon cancer. Roberta [name] agrees to continue taking Vit D-3 and will follow up for routine testing of vitamin D, at least 2-3 times per year. She was informed of the risk of over-replacement of vitamin D and agrees to not increase her dose unless she discusses this with [place] first. We will check Vit D level today. Roberta [name] agrees to follow up  with our clinic in 2 weeks.  Obesity Roberta [name] is currently in the action stage of change. As such, her goal is to continue with weight loss efforts She has agreed to follow the Category 2 plan Roberta [name] will continue current exercise regimen for weight loss and overall health benefits. We discussed the following Behavioral Modification Strategies today: planning for success   Roberta [name] has agreed to follow up with our clinic in 2 weeks. She was informed of the importance of frequent follow up visits to maximize her success with intensive lifestyle modifications for her multiple health conditions.   OBESITY BEHAVIORAL INTERVENTION VISIT  Today's visit was # 8  Starting weight: 292 lbs Starting date: 10/08/17 Today's weight : 266 lbs  Today's date: 01/31/2018 Total lbs lost to date: 58    ASK: We discussed the diagnosis of obesity with Roberta [name] Kind today and Roberta [name] agreed to give [place] permission to discuss obesity behavioral modification therapy today.  ASSESS: Roberta [name] has the diagnosis of obesity and her BMI today is 42.95 Roberta [name] is in the action stage of change   ADVISE: Roberta [name] was educated on the multiple health risks of obesity as well as the benefit of weight loss to improve her health. She was advised of the need for long term treatment and the importance of lifestyle modifications to improve her current health and to decrease her risk of future health problems.  AGREE: Multiple dietary modification options and treatment options were discussed and  Roberta [name] agreed to follow the recommendations documented in the above note.  ARRANGE: Roberta [name] was educated on the importance of frequent visits to treat obesity as outlined per CMS and USPSTF guidelines and agreed to schedule her next follow up appointment today.  Wilhemena Durie, am acting as Location manager for [employer], FNP-C.  I have reviewed the  above documentation for accuracy and completeness, and I agree with the above.  - Dawn  Whitmire, FNP-C.

## 2018-02-04 ENCOUNTER — Encounter (INDEPENDENT_AMBULATORY_CARE_PROVIDER_SITE_OTHER): Payer: Self-pay | Admitting: Family Medicine

## 2018-02-04 DIAGNOSIS — E559 Vitamin D deficiency, unspecified: Secondary | ICD-10-CM | POA: Insufficient documentation

## 2018-02-19 ENCOUNTER — Encounter (INDEPENDENT_AMBULATORY_CARE_PROVIDER_SITE_OTHER): Payer: Self-pay | Admitting: Family Medicine

## 2018-02-19 ENCOUNTER — Ambulatory Visit (INDEPENDENT_AMBULATORY_CARE_PROVIDER_SITE_OTHER): Payer: BC Managed Care – PPO | Admitting: Family Medicine

## 2018-02-19 VITALS — BP 113/74 | HR 66 | Temp 98.3°F | Ht 66.0 in | Wt 268.0 lb

## 2018-02-19 DIAGNOSIS — E559 Vitamin D deficiency, unspecified: Secondary | ICD-10-CM | POA: Diagnosis not present

## 2018-02-19 DIAGNOSIS — Z6841 Body Mass Index (BMI) 40.0 and over, adult: Secondary | ICD-10-CM | POA: Diagnosis not present

## 2018-02-19 DIAGNOSIS — R7303 Prediabetes: Secondary | ICD-10-CM

## 2018-02-19 DIAGNOSIS — Z9189 Other specified personal risk factors, not elsewhere classified: Secondary | ICD-10-CM

## 2018-02-19 MED ORDER — METFORMIN HCL 500 MG PO TABS: 500.0000 mg | ORAL_TABLET | Freq: Every day | ORAL | 0 refills | Status: DC

## 2018-02-20 NOTE — Progress Notes (Signed)
Office: [phone]  /  Fax: [phone]   HPI:   Chief Complaint: OBESITY Roberta [name] is here to discuss her progress with her obesity treatment plan. She is on the Category 2 plan and is following her eating plan approximately 85-90 % of the time. She states she is doing cardio and strengthening for 30-60 minutes 3-6 times per week. Roberta [name] had a death in the family after her last visit, and did some emotional eating with snack foods and wine. She thinks emotional eating has stopped and she is trying to get back into the daily routine. Breakfast and lunch is on track, but she is struggling with dinner. She is looking for options for dinner.  Her weight is 268 lb (121.6 kg) today and has gained 2 pounds since her last visit. She has lost 24 lbs since starting treatment with [place].  Vitamin D Deficiency Roberta [name] has a diagnosis of vitamin D deficiency. She is currently taking OTC Vit D. She notes fatigue and denies nausea, vomiting or muscle weakness.  Pre-Diabetes Roberta [name] has a diagnosis of pre-diabetes based on her elevated Hgb A1c and was informed this puts her at greater risk of developing diabetes. She notes carbohydrate cravings and denies GI side effects of metformin. She continues to work on diet and exercise to decrease risk of diabetes. She denies hypoglycemia.  At risk for diabetes Roberta [name] is at higher than average risk for developing diabetes due to her obesity and pre-diabetes. She currently denies polyuria or polydipsia.  ASSESSMENT AND PLAN:  Vitamin D deficiency  Prediabetes - Plan: metFORMIN (GLUCOPHAGE) 500 MG tablet  At risk for diabetes mellitus  Class 3 severe obesity with serious comorbidity and body mass index (BMI) of 40.0 to 44.9 in adult, unspecified obesity type (Roberta [name])  PLAN:  Vitamin D Deficiency Roberta [name] was informed that low vitamin D levels contributes to fatigue and are associated with obesity, breast, and colon cancer. Roberta [name] agrees to continue taking OTC Vit D and  will follow up for routine testing of vitamin D, at least 2-3 times per year. She was informed of the risk of over-replacement of vitamin D and agrees to not increase her dose unless she discusses this with [place] first. Roberta [name] agrees to follow up with our clinic in 2 weeks.  Pre-Diabetes Roberta [name] will continue to work on weight loss, exercise, and decreasing simple carbohydrates in her diet to help decrease the risk of diabetes. We dicussed metformin including benefits and risks. She was informed that eating too many simple carbohydrates or too many calories at one sitting increases the likelihood of GI side effects. Roberta [name] agrees to continue taking metformin 500 mg PO daily #30 and we will refill for 1 month. Roberta [name] agrees to follow up with our clinic in 2 weeks as directed to monitor her progress.  Diabetes risk counseling Roberta [name] was given extended (15 minutes) diabetes prevention counseling today. She is 53 y.o. female and has risk factors for diabetes including obesity and pre-diabetes. We discussed intensive lifestyle modifications today with an emphasis on weight loss as well as increasing exercise and decreasing simple carbohydrates in her diet.  Obesity Roberta [name] is currently in the action stage of change. As such, her goal is to continue with weight loss efforts She has agreed to keep a food journal with 400-500 calories and 35+ grams of protein at supper daily and follow the Category 2 plan Roberta [name] has been instructed to work up to a goal of 150 minutes of combined cardio and strengthening exercise per week  for weight loss and overall health benefits. We discussed the following Behavioral Modification Strategies today: increasing lean protein intake, increasing vegetables and work on meal planning and easy cooking plans, and planning for success   Roberta [name] has agreed to follow up with our clinic in 2 weeks. She was informed of the importance of frequent follow up visits to maximize her success with intensive  lifestyle modifications for her multiple health conditions.  ALLERGIES: Allergies  Allergen Reactions  . Lisinopril Swelling    Swelling of the tongue  . Penicillins     Pt told as a child-unknown reaction.  . Tetanus Toxoids Swelling    Swelling at site of injection  . Norvasc [Amlodipine Besylate] Rash    MEDICATIONS: Current Outpatient Medications on File Prior to Visit  Medication Sig Dispense Refill  . Black Cohosh 40 MG CAPS Take 1 capsule by mouth daily.    . Cholecalciferol (VITAMIN D-3) 5000 units TABS Take 1 tablet by mouth daily. 30 tablet   . losartan-hydrochlorothiazide (HYZAAR) 100-25 MG tablet Take 1 tablet by mouth daily. 90 tablet 1  . methylPREDNISolone (MEDROL DOSEPAK) 4 MG TBPK tablet 24 mg PO on day 1, then decr. by 4 mg/day x5 days 21 tablet 0  . Multiple Vitamin (MULTIVITAMIN) tablet Take 1 tablet by mouth daily.    . norethindrone (MICRONOR,CAMILA,ERRIN) 0.35 MG tablet Take 1 tablet by mouth daily.     No current facility-administered medications on file prior to visit.     PAST MEDICAL HISTORY: Past Medical History:  Diagnosis Date  . Back pain   . Hip pain   . Hypertension   . Obesity   . Prediabetes   . Swelling    bilat LE    PAST SURGICAL HISTORY: Past Surgical History:  Procedure Laterality Date  . CHOLECYSTECTOMY    . GASTRIC BYPASS    . KNEE SURGERY  2002   Ligament repair    SOCIAL HISTORY: Social History   Tobacco Use  . Smoking status: Never Smoker  . Smokeless tobacco: Never Used  Substance Use Topics  . Alcohol use: No  . Drug use: No    FAMILY HISTORY: Family History  Problem Relation Age of Onset  . Diabetes Mother   . Hypertension Mother   . Heart disease Mother   . Stroke Mother   . Kidney disease Mother   . Obesity Mother   . Cancer Maternal Aunt        lung  . Cancer Maternal Uncle        lung    ROS: Review of Systems  Constitutional: Positive for malaise/fatigue. Negative for weight loss.    Gastrointestinal: Negative for nausea and vomiting.  Genitourinary: Negative for frequency.  Musculoskeletal:       Negative muscle weakness  Endo/Heme/Allergies: Negative for polydipsia.       Negative hypoglycemia    PHYSICAL EXAM: Blood pressure 113/74, pulse 66, temperature 98.3 F (36.8 C), temperature source Oral, height 5\' 6"  (1.676 m), weight 268 lb (121.6 kg), last menstrual period 08/07/2014, SpO2 99 %. Body mass index is 43.26 kg/m. Physical Exam Vitals signs reviewed.  Constitutional:      Appearance: Normal appearance. She is obese.  Cardiovascular:     Rate and Rhythm: Normal rate.     Pulses: Normal pulses.  Pulmonary:     Effort: Pulmonary effort is normal.     Breath sounds: Normal breath sounds.  Musculoskeletal: Normal range of motion.  Skin:    General:  Skin is warm and dry.  Neurological:     Mental Status: She is alert and oriented to person, place, and time.  Psychiatric:        Mood and Affect: Mood normal.        Behavior: Behavior normal.     RECENT LABS AND TESTS: BMET    Component Value Date/Time   NA 141 01/31/2018 0840   K 3.6 01/31/2018 0840   CL 99 01/31/2018 0840   CO2 26 01/31/2018 0840   GLUCOSE 101 (H) 01/31/2018 0840   GLUCOSE 92 08/28/2017 1623   BUN 16 01/31/2018 0840   CREATININE 0.95 01/31/2018 0840   CALCIUM 8.8 01/31/2018 0840   GFRNONAA 68 01/31/2018 0840   GFRAA 79 01/31/2018 0840   Lab Results  Component Value Date   HGBA1C 6.1 (H) 01/31/2018   HGBA1C 6.0 (H) 10/08/2017   HGBA1C 6.3 06/25/2017   HGBA1C 6.3 01/22/2017   HGBA1C 6.0 07/11/2016   Lab Results  Component Value Date   INSULIN 13.4 01/31/2018   INSULIN 10.6 10/08/2017   CBC    Component Value Date/Time   WBC 6.9 10/08/2017 1239   WBC 6.0 06/25/2017 1541   RBC 4.66 10/08/2017 1239   RBC 4.70 06/25/2017 1541   HGB 12.3 10/08/2017 1239   HCT 37.2 10/08/2017 1239   PLT 262.0 06/25/2017 1541   MCV 80 10/08/2017 1239   MCH 26.4 (L) 10/08/2017  1239   MCHC 33.1 10/08/2017 1239   MCHC 32.5 06/25/2017 1541   RDW 15.1 10/08/2017 1239   LYMPHSABS 1.6 10/08/2017 1239   MONOABS 0.5 06/25/2017 1541   EOSABS 0.2 10/08/2017 1239   BASOSABS 0.0 10/08/2017 1239   Iron/TIBC/Ferritin/ %Sat    Component Value Date/Time   IRON 162 (H) 01/22/2017 1152   FERRITIN 14.5 01/22/2017 1152   Lipid Panel     Component Value Date/Time   CHOL 112 10/08/2017 1239   TRIG 69 10/08/2017 1239   HDL 50 10/08/2017 1239   CHOLHDL 2 06/25/2017 1541   VLDL 15.0 06/25/2017 1541   LDLCALC 48 10/08/2017 1239   Hepatic Function Panel     Component Value Date/Time   PROT 6.9 01/31/2018 0840   ALBUMIN 3.5 (L) 01/31/2018 0840   AST 19 01/31/2018 0840   ALT 17 01/31/2018 0840   ALKPHOS 84 01/31/2018 0840   BILITOT 0.4 01/31/2018 0840      Component Value Date/Time   TSH 1.480 10/08/2017 1239   TSH 1.24 06/25/2017 1541   TSH 1.53 07/11/2016 1150      OBESITY BEHAVIORAL INTERVENTION VISIT  Today's visit was # 9   Starting weight: 292 lbs Starting date: 10/08/17 Today's weight : 268 lbs  Today's date: 02/19/2018 Total lbs lost to date: 24    ASK: We discussed the diagnosis of obesity with Jonnie Kind today and Roberta [name] agreed to give [place] permission to discuss obesity behavioral modification therapy today.  ASSESS: Carmellia has the diagnosis of obesity and her BMI today is 43.28 Roberta [name] is in the action stage of change   ADVISE: Ravynn was educated on the multiple health risks of obesity as well as the benefit of weight loss to improve her health. She was advised of the need for long term treatment and the importance of lifestyle modifications to improve her current health and to decrease her risk of future health problems.  AGREE: Multiple dietary modification options and treatment options were discussed and  Shelina agreed to follow the recommendations documented in  the above note.  ARRANGE: Ashani was educated on the importance of frequent  visits to treat obesity as outlined per CMS and USPSTF guidelines and agreed to schedule her next follow up appointment today.  I, Trixie Dredge, am acting as transcriptionist for Ilene Qua, MD  I have reviewed the above documentation for accuracy and completeness, and I agree with the above. - Ilene Qua, MD

## 2018-03-05 ENCOUNTER — Ambulatory Visit (INDEPENDENT_AMBULATORY_CARE_PROVIDER_SITE_OTHER): Payer: BC Managed Care – PPO | Admitting: Family Medicine

## 2018-03-05 ENCOUNTER — Encounter (INDEPENDENT_AMBULATORY_CARE_PROVIDER_SITE_OTHER): Payer: Self-pay | Admitting: Family Medicine

## 2018-03-05 VITALS — BP 124/85 | HR 60 | Temp 97.7°F | Ht 66.0 in | Wt 268.0 lb

## 2018-03-05 DIAGNOSIS — R7303 Prediabetes: Secondary | ICD-10-CM

## 2018-03-05 DIAGNOSIS — I10 Essential (primary) hypertension: Secondary | ICD-10-CM

## 2018-03-05 DIAGNOSIS — Z6841 Body Mass Index (BMI) 40.0 and over, adult: Secondary | ICD-10-CM

## 2018-03-06 NOTE — Progress Notes (Signed)
Office: [phone]  /  Fax: [phone]   HPI:   Chief Complaint: OBESITY Roberta [name] is here to discuss her progress with her obesity treatment plan. She is on the keep a food journal with 400-500 calories and 35+ grams of protein at supper daily and follow the Category 2 plan and is following her eating plan approximately 95 % of the time. She states she is doing cardio and walking for 30-45 minutes 2-3 times per week. Roberta [name] notes the past few weeks she had a period, which she hasn't had in a long time (approximately 1 year ago). She has had bouts of hunger many hours after eating. She is doing at least 6 oz of meat at night, if she exercises she gets in 8 oz of meat. She didn't journal dinner at all.  Her weight is 268 lb (121.6 kg) today and has not lost weight since her last visit. She has lost 24 lbs since starting treatment with [place].  Hypertension Roberta [name] is a 53 y.o. female with hypertension. Roberta [name]'s blood pressure is controlled. She denies chest pain, chest pressure, or headaches. She is working on weight loss to help control her blood pressure with the goal of decreasing her risk of heart attack and stroke.   Pre-Diabetes Roberta [name] has a diagnosis of pre-diabetes based on her elevated Hgb A1c and was informed this puts her at greater risk of developing diabetes. She notes minimal carbohydrate cravings and denies GI side effects of metformin. She continues to work on diet and exercise to decrease risk of diabetes. She denies hypoglycemia.  ASSESSMENT AND PLAN:  Essential hypertension  Prediabetes  Class 3 severe obesity with serious comorbidity and body mass index (BMI) of 40.0 to 44.9 in adult, unspecified obesity type ([place])  PLAN:  Hypertension We discussed sodium restriction, working on healthy weight loss, and a regular exercise program as the means to achieve improved blood pressure control. Palma agreed with this plan and agreed to follow up as directed. We will continue  to monitor her blood pressure as well as her progress with the above lifestyle modifications. Roberta [name] agrees to continue her current medications and will watch for signs of hypotension as she continues her lifestyle modifications. Roberta [name] agrees to follow up with our clinic in 2 weeks.  Pre-Diabetes Roberta [name] will continue to work on weight loss, exercise, and decreasing simple carbohydrates in her diet to help decrease the risk of diabetes. We dicussed metformin including benefits and risks. She was informed that eating too many simple carbohydrates or too many calories at one sitting increases the likelihood of GI side effects. Roberta [name] agrees to continue taking metformin, no refill needed, and she agrees to follow up with our clinic in 2 weeks as directed to monitor her progress.  I spent > than 50% of the 15 minute visit on counseling as documented in the note.  Obesity Roberta [name] is currently in the action stage of change. As such, her goal is to continue with weight loss efforts She has agreed to follow the Category 2 plan Roberta [name] has been instructed to work up to a goal of 150 minutes of combined cardio and strengthening exercise per week for weight loss and overall health benefits. We discussed the following Behavioral Modification Strategies today: increasing lean protein intake, increase H20 intake, work on meal planning and easy cooking plans, and planning for success   Roberta [name] has agreed to follow up with our clinic in 2 weeks. She was informed of the importance of frequent  follow up visits to maximize her success with intensive lifestyle modifications for her multiple health conditions.  ALLERGIES: Allergies  Allergen Reactions  . Lisinopril Swelling    Swelling of the tongue  . Penicillins     Pt told as a child-unknown reaction.  . Tetanus Toxoids Swelling    Swelling at site of injection  . Norvasc [Amlodipine Besylate] Rash    MEDICATIONS: Current Outpatient Medications on File Prior to  Visit  Medication Sig Dispense Refill  . Black Cohosh 40 MG CAPS Take 1 capsule by mouth daily.    . Cholecalciferol (VITAMIN D-3) 5000 units TABS Take 1 tablet by mouth daily. 30 tablet   . losartan-hydrochlorothiazide (HYZAAR) 100-25 MG tablet Take 1 tablet by mouth daily. 90 tablet 1  . metFORMIN (GLUCOPHAGE) 500 MG tablet Take 1 tablet (500 mg total) by mouth daily with breakfast. 30 tablet 0  . methylPREDNISolone (MEDROL DOSEPAK) 4 MG TBPK tablet 24 mg PO on day 1, then decr. by 4 mg/day x5 days 21 tablet 0  . Multiple Vitamin (MULTIVITAMIN) tablet Take 1 tablet by mouth daily.    . norethindrone (MICRONOR,CAMILA,ERRIN) 0.35 MG tablet Take 1 tablet by mouth daily.     No current facility-administered medications on file prior to visit.     PAST MEDICAL HISTORY: Past Medical History:  Diagnosis Date  . Back pain   . Hip pain   . Hypertension   . Obesity   . Prediabetes   . Swelling    bilat LE    PAST SURGICAL HISTORY: Past Surgical History:  Procedure Laterality Date  . CHOLECYSTECTOMY    . GASTRIC BYPASS    . KNEE SURGERY  2002   Ligament repair    SOCIAL HISTORY: Social History   Tobacco Use  . Smoking status: Never Smoker  . Smokeless tobacco: Never Used  Substance Use Topics  . Alcohol use: No  . Drug use: No    FAMILY HISTORY: Family History  Problem Relation Age of Onset  . Diabetes Mother   . Hypertension Mother   . Heart disease Mother   . Stroke Mother   . Kidney disease Mother   . Obesity Mother   . Cancer Maternal Aunt        lung  . Cancer Maternal Uncle        lung    ROS: Review of Systems  Constitutional: Negative for weight loss.  Cardiovascular: Negative for chest pain.       Negative chest pressure  Neurological: Negative for headaches.  Endo/Heme/Allergies:       Negative hypoglycemia    PHYSICAL EXAM: Blood pressure 124/85, pulse 60, temperature 97.7 F (36.5 C), temperature source Oral, height 5\' 6"  (1.676 m), weight  268 lb (121.6 kg), last menstrual period 08/07/2014, SpO2 99 %. Body mass index is 43.26 kg/m. Physical Exam Vitals signs reviewed.  Constitutional:      Appearance: Normal appearance. She is obese.  Cardiovascular:     Rate and Rhythm: Normal rate.     Pulses: Normal pulses.  Pulmonary:     Effort: Pulmonary effort is normal.     Breath sounds: Normal breath sounds.  Musculoskeletal: Normal range of motion.  Skin:    General: Skin is warm and dry.  Neurological:     Mental Status: She is alert and oriented to person, place, and time.  Psychiatric:        Mood and Affect: Mood normal.        Behavior:  Behavior normal.     RECENT LABS AND TESTS: BMET    Component Value Date/Time   NA 141 01/31/2018 0840   K 3.6 01/31/2018 0840   CL 99 01/31/2018 0840   CO2 26 01/31/2018 0840   GLUCOSE 101 (H) 01/31/2018 0840   GLUCOSE 92 08/28/2017 1623   BUN 16 01/31/2018 0840   CREATININE 0.95 01/31/2018 0840   CALCIUM 8.8 01/31/2018 0840   GFRNONAA 68 01/31/2018 0840   GFRAA 79 01/31/2018 0840   Lab Results  Component Value Date   HGBA1C 6.1 (H) 01/31/2018   HGBA1C 6.0 (H) 10/08/2017   HGBA1C 6.3 06/25/2017   HGBA1C 6.3 01/22/2017   HGBA1C 6.0 07/11/2016   Lab Results  Component Value Date   INSULIN 13.4 01/31/2018   INSULIN 10.6 10/08/2017   CBC    Component Value Date/Time   WBC 6.9 10/08/2017 1239   WBC 6.0 06/25/2017 1541   RBC 4.66 10/08/2017 1239   RBC 4.70 06/25/2017 1541   HGB 12.3 10/08/2017 1239   HCT 37.2 10/08/2017 1239   PLT 262.0 06/25/2017 1541   MCV 80 10/08/2017 1239   MCH 26.4 (L) 10/08/2017 1239   MCHC 33.1 10/08/2017 1239   MCHC 32.5 06/25/2017 1541   RDW 15.1 10/08/2017 1239   LYMPHSABS 1.6 10/08/2017 1239   MONOABS 0.5 06/25/2017 1541   EOSABS 0.2 10/08/2017 1239   BASOSABS 0.0 10/08/2017 1239   Iron/TIBC/Ferritin/ %Sat    Component Value Date/Time   IRON 162 (H) 01/22/2017 1152   FERRITIN 14.5 01/22/2017 1152   Lipid Panel       Component Value Date/Time   CHOL 112 10/08/2017 1239   TRIG 69 10/08/2017 1239   HDL 50 10/08/2017 1239   CHOLHDL 2 06/25/2017 1541   VLDL 15.0 06/25/2017 1541   LDLCALC 48 10/08/2017 1239   Hepatic Function Panel     Component Value Date/Time   PROT 6.9 01/31/2018 0840   ALBUMIN 3.5 (L) 01/31/2018 0840   AST 19 01/31/2018 0840   ALT 17 01/31/2018 0840   ALKPHOS 84 01/31/2018 0840   BILITOT 0.4 01/31/2018 0840      Component Value Date/Time   TSH 1.480 10/08/2017 1239   TSH 1.24 06/25/2017 1541   TSH 1.53 07/11/2016 1150      OBESITY BEHAVIORAL INTERVENTION VISIT  Today's visit was # 10   Starting weight: 292 lbs Starting date: 10/08/17 Today's weight : 268 lbs Today's date: 03/05/2018 Total lbs lost to date: 24    03/05/2018  Height 5\' 6"  (1.676 m)  Weight 268 lb (121.6 kg)  BMI (Calculated) 43.28  BLOOD PRESSURE - SYSTOLIC 536  BLOOD PRESSURE - DIASTOLIC 85   Body Fat % 51 %  Total Body Water (lbs) 97.4 lbs     ASK: We discussed the diagnosis of obesity with Roberta [name] today and Roberta [name] agreed to give [place] permission to discuss obesity behavioral modification therapy today.  ASSESS: Roberta [name] has the diagnosis of obesity and her BMI today is 43.28 Analynn is in the action stage of change   ADVISE: Roberta [name] was educated on the multiple health risks of obesity as well as the benefit of weight loss to improve her health. She was advised of the need for long term treatment and the importance of lifestyle modifications to improve her current health and to decrease her risk of future health problems.  AGREE: Multiple dietary modification options and treatment options were discussed and  Roberta [name] agreed to follow the recommendations  documented in the above note.  ARRANGE: Roberta [name] was educated on the importance of frequent visits to treat obesity as outlined per CMS and USPSTF guidelines and agreed to schedule her next follow up appointment today.  I, Trixie Dredge, am acting  as transcriptionist for Ilene Qua, MD  I have reviewed the above documentation for accuracy and completeness, and I agree with the above. - Ilene Qua, MD

## 2018-03-25 ENCOUNTER — Other Ambulatory Visit: Payer: Self-pay

## 2018-03-25 ENCOUNTER — Encounter (INDEPENDENT_AMBULATORY_CARE_PROVIDER_SITE_OTHER): Payer: Self-pay | Admitting: Bariatrics

## 2018-03-25 ENCOUNTER — Ambulatory Visit (INDEPENDENT_AMBULATORY_CARE_PROVIDER_SITE_OTHER): Payer: BC Managed Care – PPO | Admitting: Bariatrics

## 2018-03-25 VITALS — BP 99/66 | HR 63 | Temp 97.7°F | Ht 66.0 in | Wt 261.0 lb

## 2018-03-25 DIAGNOSIS — R7303 Prediabetes: Secondary | ICD-10-CM

## 2018-03-25 DIAGNOSIS — Z9189 Other specified personal risk factors, not elsewhere classified: Secondary | ICD-10-CM | POA: Diagnosis not present

## 2018-03-25 DIAGNOSIS — N951 Menopausal and female climacteric states: Secondary | ICD-10-CM

## 2018-03-25 DIAGNOSIS — I10 Essential (primary) hypertension: Secondary | ICD-10-CM | POA: Diagnosis not present

## 2018-03-25 DIAGNOSIS — L659 Nonscarring hair loss, unspecified: Secondary | ICD-10-CM

## 2018-03-25 DIAGNOSIS — E66813 Obesity, class 3: Secondary | ICD-10-CM

## 2018-03-25 DIAGNOSIS — Z6841 Body Mass Index (BMI) 40.0 and over, adult: Secondary | ICD-10-CM

## 2018-03-25 MED ORDER — METFORMIN HCL 500 MG PO TABS: 500.0000 mg | ORAL_TABLET | Freq: Every day | ORAL | 0 refills | Status: DC

## 2018-03-25 NOTE — Progress Notes (Signed)
Office: [phone]  /  Fax: [phone]   HPI:   Chief Complaint: OBESITY Roberta [name] is here to discuss her progress with her obesity treatment plan. She is on the Category 2 plan and is following her eating plan approximately 80 to 90 % of the time. She states she is walking for 30 to 60 minutes 1 to 2 times per week. Roberta [name] is doing well. She would like to get down to a size 12 to 14. Roberta [name] is now working from home. She is not sleeping. Her weight is 261 lb (118.4 kg) today and has had a weight loss of 7 pounds over a period of 3 weeks since her last visit. She has lost 31 lbs since starting treatment with [place].  Pre-Diabetes Roberta [name] has a diagnosis of prediabetes based on her elevated Hgb A1c and was informed this puts her at greater risk of developing diabetes. She is taking metformin currently and continues to work on diet and exercise to decrease risk of diabetes. She denies polyphagia.  At risk for diabetes Roberta [name] is at higher than average risk for developing diabetes due to her obesity and prediabetes. She currently denies polyuria or polydipsia.  Hypertension Roberta [name] is a 53 y.o. female with hypertension. She is taking Losartan-HCTZ. Roberta [name] denies lightheadedness. She is working weight loss to help control her blood pressure with the goal of decreasing her risk of heart attack and stroke. Roberta [name] blood pressure is currently controlled.  Hair Loss (related to weight loss, perimenopausal) Roberta [name] has hair loss related to weight loss and she is peri-menopausal. She is taking a multi-vitamin, 50+ with added vitamin E.  Perimenopausal Roberta [name] had her last menstrual period approximately one year ago. She was offered FSH/LH.  ASSESSMENT AND PLAN:  Prediabetes - Plan: metFORMIN (GLUCOPHAGE) 500 MG tablet  Essential hypertension  Hair loss  Perimenopausal  At risk for diabetes mellitus  Class 3 severe obesity with serious comorbidity and body mass index (BMI) of 40.0 to  44.9 in adult, unspecified obesity type (Roberta [name])  PLAN:  Pre-Diabetes Roberta [name] will continue to work on weight loss, exercise, and decreasing simple carbohydrates in her diet to help decrease the risk of diabetes. We dicussed metformin including benefits and risks. She was informed that eating too many simple carbohydrates or too many calories at one sitting increases the likelihood of GI side effects. Roberta [name] agreed to continue Metformin 500 mg once daily with breakfast #30 with no refills and follow up with [place] as directed to monitor her progress.  Diabetes risk counseling Roberta [name] was given extended (15 minutes) diabetes prevention counseling today. She is 53 y.o. female and has risk factors for diabetes including obesity and prediabetes. We discussed intensive lifestyle modifications today with an emphasis on weight loss as well as increasing exercise and decreasing simple carbohydrates in her diet.  Hypertension We discussed sodium restriction, working on healthy weight loss, and a regular exercise program as the means to achieve improved blood pressure control. Roberta [name] agreed with this plan and agreed to follow up as directed. We will continue to monitor her blood pressure as well as her progress with the above lifestyle modifications. She will continue her medications as prescribed and will watch for signs of hypotension as she continues her lifestyle modifications.  Hair Loss (related to weight loss, perimenopausal) Roberta [name] agrees to add Biotin  (hair, skin, nails), Omega 3 and Zinc OTC at standard dose. She agrees to follow up with our clinic in 2 weeks.  Perimenopausal Roberta [name] will  talk with her OB-GYN. She declined testing today. Roberta [name] agrees to follow up with our clinic in 2 weeks.  Obesity Roberta [name] is currently in the action stage of change. As such, her goal is to continue with weight loss efforts She has agreed to follow the Category 2 plan Roberta [name] has been instructed to continue walking as above,  twice daily and do exercise bands and resistance bands exercises for weight loss and overall health benefits. We discussed the following Behavioral Modification Strategies today: increase H2O intake, no skipping meals, keeping healthy foods in the home, increasing lean protein intake, decreasing simple carbohydrates, increasing vegetables, decrease eating out, working on distraction strategies when hungry, and work on meal planning and easy cooking plans  Roberta [name] has agreed to follow up with our clinic in 2 weeks. She was informed of the importance of frequent follow up visits to maximize her success with intensive lifestyle modifications for her multiple health conditions.  ALLERGIES: Allergies  Allergen Reactions  . Lisinopril Swelling    Swelling of the tongue  . Penicillins     Pt told as a child-unknown reaction.  . Tetanus Toxoids Swelling    Swelling at site of injection  . Norvasc [Amlodipine Besylate] Rash    MEDICATIONS: Current Outpatient Medications on File Prior to Visit  Medication Sig Dispense Refill  . Black Cohosh 40 MG CAPS Take 1 capsule by mouth daily.    . Cholecalciferol (VITAMIN D-3) 5000 units TABS Take 1 tablet by mouth daily. 30 tablet   . losartan-hydrochlorothiazide (HYZAAR) 100-25 MG tablet Take 1 tablet by mouth daily. 90 tablet 1  . methylPREDNISolone (MEDROL DOSEPAK) 4 MG TBPK tablet 24 mg PO on day 1, then decr. by 4 mg/day x5 days 21 tablet 0  . Multiple Vitamin (MULTIVITAMIN) tablet Take 1 tablet by mouth daily.    . norethindrone (MICRONOR,CAMILA,ERRIN) 0.35 MG tablet Take 1 tablet by mouth daily.     No current facility-administered medications on file prior to visit.     PAST MEDICAL HISTORY: Past Medical History:  Diagnosis Date  . Back pain   . Hip pain   . Hypertension   . Obesity   . Prediabetes   . Swelling    bilat LE    PAST SURGICAL HISTORY: Past Surgical History:  Procedure Laterality Date  . CHOLECYSTECTOMY    . GASTRIC  BYPASS    . KNEE SURGERY  2002   Ligament repair    SOCIAL HISTORY: Social History   Tobacco Use  . Smoking status: Never Smoker  . Smokeless tobacco: Never Used  Substance Use Topics  . Alcohol use: No  . Drug use: No    FAMILY HISTORY: Family History  Problem Relation Age of Onset  . Diabetes Mother   . Hypertension Mother   . Heart disease Mother   . Stroke Mother   . Kidney disease Mother   . Obesity Mother   . Cancer Maternal Aunt        lung  . Cancer Maternal Uncle        lung    ROS: Review of Systems  Constitutional: Positive for weight loss.  Genitourinary: Negative for frequency.  Skin:       Positive for hair loss  Neurological:       Negative for lightheadedness  Endo/Heme/Allergies: Negative for polydipsia.       Negative for polyphagia  Psychiatric/Behavioral: The patient has insomnia.     PHYSICAL EXAM: Blood pressure 99/66, pulse 63, temperature 97.7  F (36.5 C), temperature source Oral, height 5\' 6"  (1.676 m), weight 261 lb (118.4 kg), last menstrual period 08/07/2014, SpO2 100 %. Body mass index is 42.13 kg/m. Physical Exam Vitals signs reviewed.  Constitutional:      Appearance: Normal appearance. She is well-developed. She is obese.  Cardiovascular:     Rate and Rhythm: Normal rate.  Pulmonary:     Effort: Pulmonary effort is normal.  Musculoskeletal: Normal range of motion.  Skin:    General: Skin is warm and dry.  Neurological:     Mental Status: She is alert and oriented to person, place, and time.  Psychiatric:        Mood and Affect: Mood normal.        Behavior: Behavior normal.     RECENT LABS AND TESTS: BMET    Component Value Date/Time   NA 141 01/31/2018 0840   K 3.6 01/31/2018 0840   CL 99 01/31/2018 0840   CO2 26 01/31/2018 0840   GLUCOSE 101 (H) 01/31/2018 0840   GLUCOSE 92 08/28/2017 1623   BUN 16 01/31/2018 0840   CREATININE 0.95 01/31/2018 0840   CALCIUM 8.8 01/31/2018 0840   GFRNONAA 68 01/31/2018  0840   GFRAA 79 01/31/2018 0840   Lab Results  Component Value Date   HGBA1C 6.1 (H) 01/31/2018   HGBA1C 6.0 (H) 10/08/2017   HGBA1C 6.3 06/25/2017   HGBA1C 6.3 01/22/2017   HGBA1C 6.0 07/11/2016   Lab Results  Component Value Date   INSULIN 13.4 01/31/2018   INSULIN 10.6 10/08/2017   CBC    Component Value Date/Time   WBC 6.9 10/08/2017 1239   WBC 6.0 06/25/2017 1541   RBC 4.66 10/08/2017 1239   RBC 4.70 06/25/2017 1541   HGB 12.3 10/08/2017 1239   HCT 37.2 10/08/2017 1239   PLT 262.0 06/25/2017 1541   MCV 80 10/08/2017 1239   MCH 26.4 (L) 10/08/2017 1239   MCHC 33.1 10/08/2017 1239   MCHC 32.5 06/25/2017 1541   RDW 15.1 10/08/2017 1239   LYMPHSABS 1.6 10/08/2017 1239   MONOABS 0.5 06/25/2017 1541   EOSABS 0.2 10/08/2017 1239   BASOSABS 0.0 10/08/2017 1239   Iron/TIBC/Ferritin/ %Sat    Component Value Date/Time   IRON 162 (H) 01/22/2017 1152   FERRITIN 14.5 01/22/2017 1152   Lipid Panel     Component Value Date/Time   CHOL 112 10/08/2017 1239   TRIG 69 10/08/2017 1239   HDL 50 10/08/2017 1239   CHOLHDL 2 06/25/2017 1541   VLDL 15.0 06/25/2017 1541   LDLCALC 48 10/08/2017 1239   Hepatic Function Panel     Component Value Date/Time   PROT 6.9 01/31/2018 0840   ALBUMIN 3.5 (L) 01/31/2018 0840   AST 19 01/31/2018 0840   ALT 17 01/31/2018 0840   ALKPHOS 84 01/31/2018 0840   BILITOT 0.4 01/31/2018 0840      Component Value Date/Time   TSH 1.480 10/08/2017 1239   TSH 1.24 06/25/2017 1541   TSH 1.53 07/11/2016 1150   Results for RYN, PEINE (MRN [id number]) as of 03/25/2018 16:33  Ref. Range 01/31/2018 08:40  Vitamin D, 25-Hydroxy Latest Ref Range: 30.0 - 100.0 ng/mL 61.3     OBESITY BEHAVIORAL INTERVENTION VISIT  Today's visit was # 11   Starting weight: 292 lbs Starting date: 10/08/2017 Today's weight : 261 lbs Today's date: 03/25/2018 Total lbs lost to date: 31    03/25/2018  Height 5\' 6"  (1.676 m)  Weight 261 lb (118.4  kg)  BMI  (Calculated) 42.15  BLOOD PRESSURE - SYSTOLIC 99  BLOOD PRESSURE - DIASTOLIC 66   Body Fat % 94.1 %  Total Body Water (lbs) 92.4 lbs    ASK: We discussed the diagnosis of obesity with Roberta [name] today and Shervon agreed to give [place] permission to discuss obesity behavioral modification therapy today.  ASSESS: Camiah has the diagnosis of obesity and her BMI today is 42.15 [name] is in the action stage of change   ADVISE: Shanese was educated on the multiple health risks of obesity as well as the benefit of weight loss to improve her health. She was advised of the need for long term treatment and the importance of lifestyle modifications to improve her current health and to decrease her risk of future health problems.  AGREE: Multiple dietary modification options and treatment options were discussed and  Bijal agreed to follow the recommendations documented in the above note.  ARRANGE: Rivka was educated on the importance of frequent visits to treat obesity as outlined per CMS and USPSTF guidelines and agreed to schedule her next follow up appointment today.  Corey Skains, am acting as Location manager for [employer]. Owens Shark, DO  I have reviewed the above documentation for accuracy and completeness, and I agree with the above. -Jearld Lesch, DO

## 2018-03-26 ENCOUNTER — Encounter (INDEPENDENT_AMBULATORY_CARE_PROVIDER_SITE_OTHER): Payer: Self-pay

## 2018-04-03 ENCOUNTER — Ambulatory Visit: Payer: BC Managed Care – PPO

## 2018-04-09 ENCOUNTER — Ambulatory Visit (INDEPENDENT_AMBULATORY_CARE_PROVIDER_SITE_OTHER): Payer: BC Managed Care – PPO | Admitting: Family Medicine

## 2018-04-09 ENCOUNTER — Other Ambulatory Visit: Payer: Self-pay

## 2018-04-09 ENCOUNTER — Encounter (INDEPENDENT_AMBULATORY_CARE_PROVIDER_SITE_OTHER): Payer: Self-pay | Admitting: Family Medicine

## 2018-04-09 DIAGNOSIS — Z6841 Body Mass Index (BMI) 40.0 and over, adult: Secondary | ICD-10-CM

## 2018-04-09 DIAGNOSIS — I10 Essential (primary) hypertension: Secondary | ICD-10-CM

## 2018-04-09 DIAGNOSIS — R7303 Prediabetes: Secondary | ICD-10-CM

## 2018-04-09 NOTE — Progress Notes (Signed)
Office: [phone]  /  Fax: [phone] TeleHealth Visit:  Roberta [name] has verbally consented to this TeleHealth visit today. The patient is located at home, the provider is located at the [employer] [facility] office. The participants in this visit include the listed provider and patient and provider's assistant. The visit was conducted today via skype.  HPI:   Chief Complaint: OBESITY Roberta [name] is here to discuss her progress with her obesity treatment plan. She is on the Category 2 plan and is following her eating plan approximately 80-90 % of the time. She states she is walking for 30 minutes 7 times per week. [name] has been ordering almost everything online. She is getting groceries and prescriptions via online. She is staying at home most of the time. Her hours have been reduced at work.  We were unable to weigh the patient today for this TeleHealth visit. She feels as if she has maintained her weight since her last visit. She has lost 31 lbs since starting treatment with [place].  Pre-Diabetes Roberta [name] has a diagnosis of pre-diabetes based on her elevated Hgb A1c and was informed this puts her at greater risk of developing diabetes. She denies GI side effects of metformin and notes minimal carbohydrate cravings. She continues to work on diet and exercise to decrease risk of diabetes. She denies hypoglycemia.  Hypertension [name] is a 53 y.o. female with hypertension. Siarra's blood pressure was controlled previously. She denies chest pain, chest pressure, or headaches. She is working on weight loss to help control her blood pressure with the goal of decreasing her risk of heart attack and stroke.   ASSESSMENT AND PLAN:  Prediabetes  Essential hypertension  Class 3 severe obesity with serious comorbidity and body mass index (BMI) of 40.0 to 44.9 in adult, unspecified obesity type [facility])  PLAN:  Pre-Diabetes Roberta [name] will continue to work on weight loss, exercise, and  decreasing simple carbohydrates in her diet to help decrease the risk of diabetes. We dicussed metformin including benefits and risks. She was informed that eating too many simple carbohydrates or too many calories at one sitting increases the likelihood of GI side effects. Roberta [name] agrees to continue taking metformin and we will repeat labs in early May. Roberta [name] agrees to follow up with our clinic in 2 weeks as directed to monitor her progress.  Hypertension We discussed sodium restriction, working on healthy weight loss, and a regular exercise program as the means to achieve improved blood pressure control. Roberta [name] agreed with this plan and agreed to follow up as directed. We will continue to monitor her blood pressure as well as her progress with the above lifestyle modifications. Roberta [name] agrees to continue her current medications and will watch for signs of hypotension as she continues her lifestyle modifications. Roberta [name] agrees to follow up with our clinic in 2 weeks.  Obesity Roberta [name] is currently in the action stage of change. As such, her goal is to continue with weight loss efforts She has agreed to follow the Category 2 plan Roberta [name] has been instructed to work up to a goal of 150 minutes of combined cardio and strengthening exercise per week for weight loss and overall health benefits. We discussed the following Behavioral Modification Strategies today: increasing lean protein intake, increasing vegetables, work on meal planning and easy cooking plans, ways to avoid boredom eating, avoiding temptations, and planning for success   Roberta [name] has agreed to follow up with our clinic in 2 weeks. She was informed of the  importance of frequent follow up visits to maximize her success with intensive lifestyle modifications for her multiple health conditions.  ALLERGIES: Allergies  Allergen Reactions  . Lisinopril Swelling    Swelling of the tongue  . Penicillins     Pt told as a child-unknown reaction.  .  Tetanus Toxoids Swelling    Swelling at site of injection  . Norvasc [Amlodipine Besylate] Rash    MEDICATIONS: Current Outpatient Medications on File Prior to Visit  Medication Sig Dispense Refill  . Black Cohosh 40 MG CAPS Take 1 capsule by mouth daily.    . Cholecalciferol (VITAMIN D-3) 5000 units TABS Take 1 tablet by mouth daily. 30 tablet   . losartan-hydrochlorothiazide (HYZAAR) 100-25 MG tablet Take 1 tablet by mouth daily. 90 tablet 1  . metFORMIN (GLUCOPHAGE) 500 MG tablet Take 1 tablet (500 mg total) by mouth daily with breakfast. 30 tablet 0  . Multiple Vitamin (MULTIVITAMIN) tablet Take 1 tablet by mouth daily.    . norethindrone (MICRONOR,CAMILA,ERRIN) 0.35 MG tablet Take 1 tablet by mouth daily.     No current facility-administered medications on file prior to visit.     PAST MEDICAL HISTORY: Past Medical History:  Diagnosis Date  . Back pain   . Hip pain   . Hypertension   . Obesity   . Prediabetes   . Swelling    bilat LE    PAST SURGICAL HISTORY: Past Surgical History:  Procedure Laterality Date  . CHOLECYSTECTOMY    . GASTRIC BYPASS    . KNEE SURGERY  2002   Ligament repair    SOCIAL HISTORY: Social History   Tobacco Use  . Smoking status: Never Smoker  . Smokeless tobacco: Never Used  Substance Use Topics  . Alcohol use: No  . Drug use: No    FAMILY HISTORY: Family History  Problem Relation Age of Onset  . Diabetes Mother   . Hypertension Mother   . Heart disease Mother   . Stroke Mother   . Kidney disease Mother   . Obesity Mother   . Cancer Maternal Aunt        lung  . Cancer Maternal Uncle        lung    ROS: Review of Systems  Constitutional: Negative for weight loss.  Cardiovascular: Negative for chest pain.       Negative chest pressure  Neurological: Negative for headaches.  Endo/Heme/Allergies:       Negative hypoglycemia    PHYSICAL EXAM: Pt in no acute distress  RECENT LABS AND TESTS: BMET    Component  Value Date/Time   NA 141 01/31/2018 0840   K 3.6 01/31/2018 0840   CL 99 01/31/2018 0840   CO2 26 01/31/2018 0840   GLUCOSE 101 (H) 01/31/2018 0840   GLUCOSE 92 08/28/2017 1623   BUN 16 01/31/2018 0840   CREATININE 0.95 01/31/2018 0840   CALCIUM 8.8 01/31/2018 0840   GFRNONAA 68 01/31/2018 0840   GFRAA 79 01/31/2018 0840   Lab Results  Component Value Date   HGBA1C 6.1 (H) 01/31/2018   HGBA1C 6.0 (H) 10/08/2017   HGBA1C 6.3 06/25/2017   HGBA1C 6.3 01/22/2017   HGBA1C 6.0 07/11/2016   Lab Results  Component Value Date   INSULIN 13.4 01/31/2018   INSULIN 10.6 10/08/2017   CBC    Component Value Date/Time   WBC 6.9 10/08/2017 1239   WBC 6.0 06/25/2017 1541   RBC 4.66 10/08/2017 1239   RBC 4.70 06/25/2017 1541  HGB 12.3 10/08/2017 1239   HCT 37.2 10/08/2017 1239   PLT 262.0 06/25/2017 1541   MCV 80 10/08/2017 1239   MCH 26.4 (L) 10/08/2017 1239   MCHC 33.1 10/08/2017 1239   MCHC 32.5 06/25/2017 1541   RDW 15.1 10/08/2017 1239   LYMPHSABS 1.6 10/08/2017 1239   MONOABS 0.5 06/25/2017 1541   EOSABS 0.2 10/08/2017 1239   BASOSABS 0.0 10/08/2017 1239   Iron/TIBC/Ferritin/ %Sat    Component Value Date/Time   IRON 162 (H) 01/22/2017 1152   FERRITIN 14.5 01/22/2017 1152   Lipid Panel     Component Value Date/Time   CHOL 112 10/08/2017 1239   TRIG 69 10/08/2017 1239   HDL 50 10/08/2017 1239   CHOLHDL 2 06/25/2017 1541   VLDL 15.0 06/25/2017 1541   LDLCALC 48 10/08/2017 1239   Hepatic Function Panel     Component Value Date/Time   PROT 6.9 01/31/2018 0840   ALBUMIN 3.5 (L) 01/31/2018 0840   AST 19 01/31/2018 0840   ALT 17 01/31/2018 0840   ALKPHOS 84 01/31/2018 0840   BILITOT 0.4 01/31/2018 0840      Component Value Date/Time   TSH 1.480 10/08/2017 1239   TSH 1.24 06/25/2017 1541   TSH 1.53 07/11/2016 1150      I, Trixie Dredge, am acting as transcriptionist for Ilene Qua, MD  I have reviewed the above documentation for accuracy and  completeness, and I agree with the above. - Ilene Qua, MD

## 2018-04-23 ENCOUNTER — Ambulatory Visit (INDEPENDENT_AMBULATORY_CARE_PROVIDER_SITE_OTHER): Payer: BC Managed Care – PPO | Admitting: Family Medicine

## 2018-04-23 ENCOUNTER — Other Ambulatory Visit: Payer: Self-pay

## 2018-04-23 ENCOUNTER — Encounter (INDEPENDENT_AMBULATORY_CARE_PROVIDER_SITE_OTHER): Payer: Self-pay | Admitting: Family Medicine

## 2018-04-23 DIAGNOSIS — Z6841 Body Mass Index (BMI) 40.0 and over, adult: Secondary | ICD-10-CM | POA: Diagnosis not present

## 2018-04-23 DIAGNOSIS — R7303 Prediabetes: Secondary | ICD-10-CM | POA: Diagnosis not present

## 2018-04-23 DIAGNOSIS — I10 Essential (primary) hypertension: Secondary | ICD-10-CM

## 2018-04-23 MED ORDER — METFORMIN HCL 500 MG PO TABS: 500.0000 mg | ORAL_TABLET | Freq: Every day | ORAL | 0 refills | Status: DC

## 2018-04-28 NOTE — Progress Notes (Signed)
Office: [phone]  /  Fax: [phone] TeleHealth Visit:  Roberta [name] has verbally consented to this TeleHealth visit today. The patient is located at home, the provider is located at the [employer] [facility] office. The participants in this visit include the listed provider and patient. The visit was conducted today via webex.  HPI:   Chief Complaint: OBESITY Roberta [name] is here to discuss her progress with her obesity treatment plan. She is on the Category 2 plan and is following her eating plan approximately 85-90 % of the time. She states she is walking for 30 minutes 3-4 times per week. [name] voices concern over anxiety secondary to weight. She is missing the ability to go to the gym and do the elliptical. She is having a hard time getting motivated to reach out to other people during social distancing.  We were unable to weigh the patient today for this TeleHealth visit. She is unsure if she has gained or lost weight since her last visit. She has lost 31 lbs since starting treatment with [place].  Pre-Diabetes Roberta [name] has a diagnosis of pre-diabetes based on her elevated Hgb A1c and was informed this puts her at greater risk of developing diabetes. She notes minimal carbohydrate cravings. She denies GI side effects of metformin and continues to work on diet and exercise to decrease risk of diabetes. She denies hypoglycemia.  Hypertension Roberta [name] is a 53 y.o. female with hypertension. [name]'s blood pressure was previously controlled. She denies chest pain, chest pressure, or headaches. She is working on weight loss to help control her blood pressure with the goal of decreasing her risk of heart attack and stroke.   ASSESSMENT AND PLAN:  Prediabetes - Plan: metFORMIN (GLUCOPHAGE) 500 MG tablet  Essential hypertension  Class 3 severe obesity with serious comorbidity and body mass index (BMI) of 40.0 to 44.9 in adult, unspecified obesity type ([place])  PLAN:  Pre-Diabetes Roberta [name]  will continue to work on weight loss, exercise, and decreasing simple carbohydrates in her diet to help decrease the risk of diabetes. We dicussed metformin including benefits and risks. She was informed that eating too many simple carbohydrates or too many calories at one sitting increases the likelihood of GI side effects. Roberta [name] agrees to continue taking metformin 500 mg PO daily #30 and we will refill for 1 month. We will repeat labs in mid May. Roberta [name] agrees to follow up with our clinic in 2 weeks as directed to monitor her progress.  Hypertension We discussed sodium restriction, working on healthy weight loss, and a regular exercise program as the means to achieve improved blood pressure control. [name] agreed with this plan and agreed to follow up as directed. We will continue to monitor her blood pressure as well as her progress with the above lifestyle modifications. [name] agrees to continue taking Hyzaar with no change in dose and will watch for signs of hypotension as she continues her lifestyle modifications. [name] agrees to follow up with our clinic in 2 weeks.  Obesity Roberta [name] is currently in the action stage of change. As such, her goal is to continue with weight loss efforts She has agreed to follow the Category 2 plan [name] has been instructed to work up to a goal of 150 minutes of combined cardio and strengthening exercise per week for weight loss and overall health benefits. We discussed the following Behavioral Modification Strategies today: increasing lean protein intake, work on meal planning and easy cooking plans, decrease junk food, avoiding  temptations, better snacking choices, and planning for success   Roberta [name] has agreed to follow up with our clinic in 2 weeks. She was informed of the importance of frequent follow up visits to maximize her success with intensive lifestyle modifications for her multiple health conditions.  ALLERGIES: Allergies  Allergen Reactions  . Lisinopril  Swelling    Swelling of the tongue  . Penicillins     Pt told as a child-unknown reaction.  . Tetanus Toxoids Swelling    Swelling at site of injection  . Norvasc [Amlodipine Besylate] Rash    MEDICATIONS: Current Outpatient Medications on File Prior to Visit  Medication Sig Dispense Refill  . Black Cohosh 40 MG CAPS Take 1 capsule by mouth daily.    . Cholecalciferol (VITAMIN D-3) 5000 units TABS Take 1 tablet by mouth daily. 30 tablet   . losartan-hydrochlorothiazide (HYZAAR) 100-25 MG tablet Take 1 tablet by mouth daily. 90 tablet 1  . Multiple Vitamin (MULTIVITAMIN) tablet Take 1 tablet by mouth daily.    . norethindrone (MICRONOR,CAMILA,ERRIN) 0.35 MG tablet Take 1 tablet by mouth daily.     No current facility-administered medications on file prior to visit.     PAST MEDICAL HISTORY: Past Medical History:  Diagnosis Date  . Back pain   . Hip pain   . Hypertension   . Obesity   . Prediabetes   . Swelling    bilat LE    PAST SURGICAL HISTORY: Past Surgical History:  Procedure Laterality Date  . CHOLECYSTECTOMY    . GASTRIC BYPASS    . KNEE SURGERY  2002   Ligament repair    SOCIAL HISTORY: Social History   Tobacco Use  . Smoking status: Never Smoker  . Smokeless tobacco: Never Used  Substance Use Topics  . Alcohol use: No  . Drug use: No    FAMILY HISTORY: Family History  Problem Relation Age of Onset  . Diabetes Mother   . Hypertension Mother   . Heart disease Mother   . Stroke Mother   . Kidney disease Mother   . Obesity Mother   . Cancer Maternal Aunt        lung  . Cancer Maternal Uncle        lung    ROS: Review of Systems  Constitutional: Negative for weight loss.  Cardiovascular: Negative for chest pain.       Negative chest pressure  Neurological: Negative for headaches.  Endo/Heme/Allergies:       Negative hypoglycemia    PHYSICAL EXAM: Pt in no acute distress  RECENT LABS AND TESTS: BMET    Component Value Date/Time    NA 141 01/31/2018 0840   K 3.6 01/31/2018 0840   CL 99 01/31/2018 0840   CO2 26 01/31/2018 0840   GLUCOSE 101 (H) 01/31/2018 0840   GLUCOSE 92 08/28/2017 1623   BUN 16 01/31/2018 0840   CREATININE 0.95 01/31/2018 0840   CALCIUM 8.8 01/31/2018 0840   GFRNONAA 68 01/31/2018 0840   GFRAA 79 01/31/2018 0840   Lab Results  Component Value Date   HGBA1C 6.1 (H) 01/31/2018   HGBA1C 6.0 (H) 10/08/2017   HGBA1C 6.3 06/25/2017   HGBA1C 6.3 01/22/2017   HGBA1C 6.0 07/11/2016   Lab Results  Component Value Date   INSULIN 13.4 01/31/2018   INSULIN 10.6 10/08/2017   CBC    Component Value Date/Time   WBC 6.9 10/08/2017 1239   WBC 6.0 06/25/2017 1541   RBC 4.66 10/08/2017 1239  RBC 4.70 06/25/2017 1541   HGB 12.3 10/08/2017 1239   HCT 37.2 10/08/2017 1239   PLT 262.0 06/25/2017 1541   MCV 80 10/08/2017 1239   MCH 26.4 (L) 10/08/2017 1239   MCHC 33.1 10/08/2017 1239   MCHC 32.5 06/25/2017 1541   RDW 15.1 10/08/2017 1239   LYMPHSABS 1.6 10/08/2017 1239   MONOABS 0.5 06/25/2017 1541   EOSABS 0.2 10/08/2017 1239   BASOSABS 0.0 10/08/2017 1239   Iron/TIBC/Ferritin/ %Sat    Component Value Date/Time   IRON 162 (H) 01/22/2017 1152   FERRITIN 14.5 01/22/2017 1152   Lipid Panel     Component Value Date/Time   CHOL 112 10/08/2017 1239   TRIG 69 10/08/2017 1239   HDL 50 10/08/2017 1239   CHOLHDL 2 06/25/2017 1541   VLDL 15.0 06/25/2017 1541   LDLCALC 48 10/08/2017 1239   Hepatic Function Panel     Component Value Date/Time   PROT 6.9 01/31/2018 0840   ALBUMIN 3.5 (L) 01/31/2018 0840   AST 19 01/31/2018 0840   ALT 17 01/31/2018 0840   ALKPHOS 84 01/31/2018 0840   BILITOT 0.4 01/31/2018 0840      Component Value Date/Time   TSH 1.480 10/08/2017 1239   TSH 1.24 06/25/2017 1541   TSH 1.53 07/11/2016 1150      I, Trixie Dredge, am acting as transcriptionist for Ilene Qua, MD I have reviewed the above documentation for accuracy and completeness, and I agree  with the above. - Ilene Qua, MD

## 2018-05-07 ENCOUNTER — Other Ambulatory Visit: Payer: Self-pay

## 2018-05-07 ENCOUNTER — Encounter (INDEPENDENT_AMBULATORY_CARE_PROVIDER_SITE_OTHER): Payer: Self-pay | Admitting: Family Medicine

## 2018-05-07 ENCOUNTER — Ambulatory Visit (INDEPENDENT_AMBULATORY_CARE_PROVIDER_SITE_OTHER): Payer: BC Managed Care – PPO | Admitting: Family Medicine

## 2018-05-07 DIAGNOSIS — Z6841 Body Mass Index (BMI) 40.0 and over, adult: Secondary | ICD-10-CM | POA: Diagnosis not present

## 2018-05-07 DIAGNOSIS — R7303 Prediabetes: Secondary | ICD-10-CM

## 2018-05-07 DIAGNOSIS — I10 Essential (primary) hypertension: Secondary | ICD-10-CM

## 2018-05-07 DIAGNOSIS — E66813 Obesity, class 3: Secondary | ICD-10-CM

## 2018-05-07 NOTE — Progress Notes (Signed)
Office: [phone]  /  Fax: [phone] TeleHealth Visit:  Roberta [name] has verbally consented to this TeleHealth visit today. The patient is located at home, the provider is located at the [employer] [facility] office. The participants in this visit include the listed provider and patient. The visit was conducted today via webex.  HPI:   Chief Complaint: OBESITY Roberta [name] is here to discuss her progress with her obesity treatment plan. She is on the Category 2 plan and is following her eating plan approximately 80 % of the time. She states she is walking for 30 minutes 2-3 times per week. [name] finally left the house last week and took about a 3 hour drive, and did go to [employer]. She did indulge in some [place] comfort foods. She has finally come up with a work schedule that she gives her some extra time. Her weight was of 263 lbs and she is weighing every other Sunday.  We were unable to weigh the patient today for this TeleHealth visit. She feels as if she has maintained her weight since her last visit. She has lost 31 lbs since starting treatment with [place].  Pre-Diabetes Roberta [name] has a diagnosis of pre-diabetes based on her elevated Hgb A1c and was informed this puts her at greater risk of developing diabetes. Last labs were in last January 2020. She is taking metformin currently and denies GI side effects. She continues to work on diet and exercise to decrease risk of diabetes. She denies hypoglycemia.  Hypertension NELIAH [name] is a 53 y.o. female with hypertension. [name]'s blood pressure was controlled previously. She denies chest pain, chest pressure, or headaches. She is working on weight loss to help control her blood pressure with the goal of decreasing her risk of heart attack and stroke.  ASSESSMENT AND PLAN:  Prediabetes  Essential hypertension  Class 3 severe obesity with serious comorbidity and body mass index (BMI) of 40.0 to 44.9 in adult, unspecified obesity  type [facility])  PLAN:  Pre-Diabetes Roberta [name] will continue to work on weight loss, exercise, and decreasing simple carbohydrates in her diet to help decrease the risk of diabetes. We dicussed metformin including benefits and risks. She was informed that eating too many simple carbohydrates or too many calories at one sitting increases the likelihood of GI side effects. Lenda agrees to continue taking metformin and we will repeat labs at next appointment. Roberta [name] agrees to follow up with our clinic in 2 weeks as directed to monitor her progress.  Hypertension We discussed sodium restriction, working on healthy weight loss, and a regular exercise program as the means to achieve improved blood pressure control. Roberta [name] agreed with this plan and agreed to follow up as directed. We will continue to monitor her blood pressure as well as her progress with the above lifestyle modifications. Roberta [name] agrees to continue her current medications and will watch for signs of hypotension as she continues her lifestyle modifications. Roberta [name] agrees to follow up with our clinic in 2 weeks.  Obesity Roberta [name] is currently in the action stage of change. As such, her goal is to continue with weight loss efforts She has agreed to follow the Category 2 plan Roberta [name] has been instructed to work up to a goal of 150 minutes of combined cardio and strengthening exercise per week for weight loss and overall health benefits. We discussed the following Behavioral Modification Strategies today: increasing lean protein intake, increasing vegetables and work on meal planning and easy cooking plans, keeping  healthy foods in the home, and planning for success   [name] has agreed to follow up with our clinic in 2 weeks. She was informed of the importance of frequent follow up visits to maximize her success with intensive lifestyle modifications for her multiple health conditions.  ALLERGIES: Allergies  Allergen Reactions  . Lisinopril Swelling     Swelling of the tongue  . Penicillins     Pt told as a child-unknown reaction.  . Tetanus Toxoids Swelling    Swelling at site of injection  . Norvasc [Amlodipine Besylate] Rash    MEDICATIONS: Current Outpatient Medications on File Prior to Visit  Medication Sig Dispense Refill  . Black Cohosh 40 MG CAPS Take 1 capsule by mouth daily.    . Cholecalciferol (VITAMIN D-3) 5000 units TABS Take 1 tablet by mouth daily. 30 tablet   . losartan-hydrochlorothiazide (HYZAAR) 100-25 MG tablet Take 1 tablet by mouth daily. 90 tablet 1  . metFORMIN (GLUCOPHAGE) 500 MG tablet Take 1 tablet (500 mg total) by mouth daily with breakfast. 30 tablet 0  . Multiple Vitamin (MULTIVITAMIN) tablet Take 1 tablet by mouth daily.    . norethindrone (MICRONOR,CAMILA,ERRIN) 0.35 MG tablet Take 1 tablet by mouth daily.     No current facility-administered medications on file prior to visit.     PAST MEDICAL HISTORY: Past Medical History:  Diagnosis Date  . Back pain   . Hip pain   . Hypertension   . Obesity   . Prediabetes   . Swelling    bilat LE    PAST SURGICAL HISTORY: Past Surgical History:  Procedure Laterality Date  . CHOLECYSTECTOMY    . GASTRIC BYPASS    . KNEE SURGERY  2002   Ligament repair    SOCIAL HISTORY: Social History   Tobacco Use  . Smoking status: Never Smoker  . Smokeless tobacco: Never Used  Substance Use Topics  . Alcohol use: No  . Drug use: No    FAMILY HISTORY: Family History  Problem Relation Age of Onset  . Diabetes Mother   . Hypertension Mother   . Heart disease Mother   . Stroke Mother   . Kidney disease Mother   . Obesity Mother   . Cancer Maternal Aunt        lung  . Cancer Maternal Uncle        lung    ROS: Review of Systems  Constitutional: Negative for weight loss.  Cardiovascular: Negative for chest pain.       Negative chest pressure  Neurological: Negative for headaches.  Endo/Heme/Allergies:       Negative hypoglycemia     PHYSICAL EXAM: Pt in no acute distress  RECENT LABS AND TESTS: BMET    Component Value Date/Time   NA 141 01/31/2018 0840   K 3.6 01/31/2018 0840   CL 99 01/31/2018 0840   CO2 26 01/31/2018 0840   GLUCOSE 101 (H) 01/31/2018 0840   GLUCOSE 92 08/28/2017 1623   BUN 16 01/31/2018 0840   CREATININE 0.95 01/31/2018 0840   CALCIUM 8.8 01/31/2018 0840   GFRNONAA 68 01/31/2018 0840   GFRAA 79 01/31/2018 0840   Lab Results  Component Value Date   HGBA1C 6.1 (H) 01/31/2018   HGBA1C 6.0 (H) 10/08/2017   HGBA1C 6.3 06/25/2017   HGBA1C 6.3 01/22/2017   HGBA1C 6.0 07/11/2016   Lab Results  Component Value Date   INSULIN 13.4 01/31/2018   INSULIN 10.6 10/08/2017   CBC  Component Value Date/Time   WBC 6.9 10/08/2017 1239   WBC 6.0 06/25/2017 1541   RBC 4.66 10/08/2017 1239   RBC 4.70 06/25/2017 1541   HGB 12.3 10/08/2017 1239   HCT 37.2 10/08/2017 1239   PLT 262.0 06/25/2017 1541   MCV 80 10/08/2017 1239   MCH 26.4 (L) 10/08/2017 1239   MCHC 33.1 10/08/2017 1239   MCHC 32.5 06/25/2017 1541   RDW 15.1 10/08/2017 1239   LYMPHSABS 1.6 10/08/2017 1239   MONOABS 0.5 06/25/2017 1541   EOSABS 0.2 10/08/2017 1239   BASOSABS 0.0 10/08/2017 1239   Iron/TIBC/Ferritin/ %Sat    Component Value Date/Time   IRON 162 (H) 01/22/2017 1152   FERRITIN 14.5 01/22/2017 1152   Lipid Panel     Component Value Date/Time   CHOL 112 10/08/2017 1239   TRIG 69 10/08/2017 1239   HDL 50 10/08/2017 1239   CHOLHDL 2 06/25/2017 1541   VLDL 15.0 06/25/2017 1541   LDLCALC 48 10/08/2017 1239   Hepatic Function Panel     Component Value Date/Time   PROT 6.9 01/31/2018 0840   ALBUMIN 3.5 (L) 01/31/2018 0840   AST 19 01/31/2018 0840   ALT 17 01/31/2018 0840   ALKPHOS 84 01/31/2018 0840   BILITOT 0.4 01/31/2018 0840      Component Value Date/Time   TSH 1.480 10/08/2017 1239   TSH 1.24 06/25/2017 1541   TSH 1.53 07/11/2016 1150      I, Trixie Dredge, am acting as transcriptionist for  Ilene Qua, MD   I have reviewed the above documentation for accuracy and completeness, and I agree with the above. - Ilene Qua, MD

## 2018-05-21 ENCOUNTER — Other Ambulatory Visit: Payer: Self-pay

## 2018-05-21 ENCOUNTER — Encounter (INDEPENDENT_AMBULATORY_CARE_PROVIDER_SITE_OTHER): Payer: Self-pay | Admitting: Family Medicine

## 2018-05-21 ENCOUNTER — Ambulatory Visit (INDEPENDENT_AMBULATORY_CARE_PROVIDER_SITE_OTHER): Payer: BC Managed Care – PPO | Admitting: Family Medicine

## 2018-05-21 DIAGNOSIS — I10 Essential (primary) hypertension: Secondary | ICD-10-CM

## 2018-05-21 DIAGNOSIS — R7303 Prediabetes: Secondary | ICD-10-CM | POA: Diagnosis not present

## 2018-05-21 DIAGNOSIS — Z6841 Body Mass Index (BMI) 40.0 and over, adult: Secondary | ICD-10-CM

## 2018-05-21 MED ORDER — METFORMIN HCL 500 MG PO TABS: 500.0000 mg | ORAL_TABLET | Freq: Every day | ORAL | 0 refills | Status: DC

## 2018-05-21 NOTE — Progress Notes (Signed)
Office: [phone]  /  Fax: [phone] TeleHealth Visit:  [name] has verbally consented to this TeleHealth visit today. The patient is located at home, the provider is located at the [facility] office. The participants in this visit include the listed provider and patient. The visit was conducted today via webex.  HPI:   Chief Complaint: OBESITY Roberta [name] is here to discuss her progress with her obesity treatment plan. She is on the Category 2 plan and is following her eating plan approximately 80 % of the time. She states she is walking 3 miles 2 times per week. Roberta [name]'s weight is of 264 lbs. She has been doing a significant amount of baking for others, not baking for herself. She voices a good amount of snacking on chips and other indulgent foods.  We were unable to weigh the patient today for this TeleHealth visit. She feels as if she has gained 3 lbs since her last visit. She has lost 31 lbs since starting treatment with [place].  Pre-Diabetes Roberta [name] has a diagnosis of pre-diabetes based on her elevated Hgb A1c and was informed this puts her at greater risk of developing diabetes. She notes carbohydrate cravings for snacks. She denies GI side effects of metformin and continues to work on diet and exercise to decrease risk of diabetes. She denies nausea or hypoglycemia.  Hypertension Roberta [name] is a 53 y.o. female with hypertension. Dalanie's blood pressure was previously well controlled. She denies chest pain, chest pressure, and headaches. She is working on weight loss to help control her blood pressure with the goal of decreasing her risk of heart attack and stroke.   ASSESSMENT AND PLAN:  Prediabetes - Plan: metFORMIN (GLUCOPHAGE) 500 MG tablet  Essential hypertension  Class 3 severe obesity with serious comorbidity and body mass index (BMI) of 40.0 to 44.9 in adult, unspecified obesity type ([place])  PLAN:  Pre-Diabetes Roberta [name] will continue to work on weight  loss, exercise, and decreasing simple carbohydrates in her diet to help decrease the risk of diabetes. We dicussed metformin including benefits and risks. She was informed that eating too many simple carbohydrates or too many calories at one sitting increases the likelihood of GI side effects. Roberta [name] agrees to continue taking metformin 500 mg PO q AM #30 and we will refill for 1 month. Roberta [name] agrees to follow up with our clinic in 2 weeks as directed to monitor her progress.  Hypertension We discussed sodium restriction, working on healthy weight loss, and a regular exercise program as the means to achieve improved blood pressure control. Roberta [name] agreed with this plan and agreed to follow up as directed. We will continue to monitor her blood pressure as well as her progress with the above lifestyle modifications. Roberta [name] agrees to continue her current medications, no change in dose. She will watch for signs of hypotension as she continues her lifestyle modifications. Roberta [name] agrees to follow up with our clinic in 2 weeks.  Obesity Roberta [name] is currently in the action stage of change. As such, her goal is to continue with weight loss efforts She has agreed to follow the Category 2 plan  Roberta [name] is to get up to 90-95 % on the meal plan. Roberta [name] has been instructed to work up to a goal of 150 minutes of combined cardio and strengthening exercise per week for weight loss and overall health benefits. We discussed the following Behavioral Modification Strategies today: increasing lean protein intake, increasing vegetables and work on meal planning and  easy cooking plans, keeping healthy foods in the home, and planning for success   Roberta [name] has agreed to follow up with our clinic in 2 weeks. She was informed of the importance of frequent follow up visits to maximize her success with intensive lifestyle modifications for her multiple health conditions.  ALLERGIES: Allergies  Allergen Reactions  . Lisinopril Swelling     Swelling of the tongue  . Penicillins     Pt told as a child-unknown reaction.  . Tetanus Toxoids Swelling    Swelling at site of injection  . Norvasc [Amlodipine Besylate] Rash    MEDICATIONS: Current Outpatient Medications on File Prior to Visit  Medication Sig Dispense Refill  . Black Cohosh 40 MG CAPS Take 1 capsule by mouth daily.    . Cholecalciferol (VITAMIN D-3) 5000 units TABS Take 1 tablet by mouth daily. 30 tablet   . losartan-hydrochlorothiazide (HYZAAR) 100-25 MG tablet Take 1 tablet by mouth daily. 90 tablet 1  . Multiple Vitamin (MULTIVITAMIN) tablet Take 1 tablet by mouth daily.    . norethindrone (MICRONOR,CAMILA,ERRIN) 0.35 MG tablet Take 1 tablet by mouth daily.     No current facility-administered medications on file prior to visit.     PAST MEDICAL HISTORY: Past Medical History:  Diagnosis Date  . Back pain   . Hip pain   . Hypertension   . Obesity   . Prediabetes   . Swelling    bilat LE    PAST SURGICAL HISTORY: Past Surgical History:  Procedure Laterality Date  . CHOLECYSTECTOMY    . GASTRIC BYPASS    . KNEE SURGERY  2002   Ligament repair    SOCIAL HISTORY: Social History   Tobacco Use  . Smoking status: Never Smoker  . Smokeless tobacco: Never Used  Substance Use Topics  . Alcohol use: No  . Drug use: No    FAMILY HISTORY: Family History  Problem Relation Age of Onset  . Diabetes Mother   . Hypertension Mother   . Heart disease Mother   . Stroke Mother   . Kidney disease Mother   . Obesity Mother   . Cancer Maternal Aunt        lung  . Cancer Maternal Uncle        lung    ROS: Review of Systems  Constitutional: Negative for weight loss.  Cardiovascular: Negative for chest pain.       Negative chest pressure  Gastrointestinal: Negative for nausea.  Neurological: Negative for headaches.  Endo/Heme/Allergies:       Negative hypoglycemia    PHYSICAL EXAM: Pt in no acute distress  RECENT LABS AND TESTS: BMET     Component Value Date/Time   NA 141 01/31/2018 0840   K 3.6 01/31/2018 0840   CL 99 01/31/2018 0840   CO2 26 01/31/2018 0840   GLUCOSE 101 (H) 01/31/2018 0840   GLUCOSE 92 08/28/2017 1623   BUN 16 01/31/2018 0840   CREATININE 0.95 01/31/2018 0840   CALCIUM 8.8 01/31/2018 0840   GFRNONAA 68 01/31/2018 0840   GFRAA 79 01/31/2018 0840   Lab Results  Component Value Date   HGBA1C 6.1 (H) 01/31/2018   HGBA1C 6.0 (H) 10/08/2017   HGBA1C 6.3 06/25/2017   HGBA1C 6.3 01/22/2017   HGBA1C 6.0 07/11/2016   Lab Results  Component Value Date   INSULIN 13.4 01/31/2018   INSULIN 10.6 10/08/2017   CBC    Component Value Date/Time   WBC 6.9 10/08/2017 1239   WBC  6.0 06/25/2017 1541   RBC 4.66 10/08/2017 1239   RBC 4.70 06/25/2017 1541   HGB 12.3 10/08/2017 1239   HCT 37.2 10/08/2017 1239   PLT 262.0 06/25/2017 1541   MCV 80 10/08/2017 1239   MCH 26.4 (L) 10/08/2017 1239   MCHC 33.1 10/08/2017 1239   MCHC 32.5 06/25/2017 1541   RDW 15.1 10/08/2017 1239   LYMPHSABS 1.6 10/08/2017 1239   MONOABS 0.5 06/25/2017 1541   EOSABS 0.2 10/08/2017 1239   BASOSABS 0.0 10/08/2017 1239   Iron/TIBC/Ferritin/ %Sat    Component Value Date/Time   IRON 162 (H) 01/22/2017 1152   FERRITIN 14.5 01/22/2017 1152   Lipid Panel     Component Value Date/Time   CHOL 112 10/08/2017 1239   TRIG 69 10/08/2017 1239   HDL 50 10/08/2017 1239   CHOLHDL 2 06/25/2017 1541   VLDL 15.0 06/25/2017 1541   LDLCALC 48 10/08/2017 1239   Hepatic Function Panel     Component Value Date/Time   PROT 6.9 01/31/2018 0840   ALBUMIN 3.5 (L) 01/31/2018 0840   AST 19 01/31/2018 0840   ALT 17 01/31/2018 0840   ALKPHOS 84 01/31/2018 0840   BILITOT 0.4 01/31/2018 0840      Component Value Date/Time   TSH 1.480 10/08/2017 1239   TSH 1.24 06/25/2017 1541   TSH 1.53 07/11/2016 1150      I, Roberta [name], am acting as transcriptionist for Ilene Qua, MD   I have reviewed the above documentation for  accuracy and completeness, and I agree with the above. - Ilene Qua, MD

## 2018-06-04 ENCOUNTER — Encounter (INDEPENDENT_AMBULATORY_CARE_PROVIDER_SITE_OTHER): Payer: Self-pay | Admitting: Family Medicine

## 2018-06-04 ENCOUNTER — Ambulatory Visit (INDEPENDENT_AMBULATORY_CARE_PROVIDER_SITE_OTHER): Payer: BC Managed Care – PPO | Admitting: Family Medicine

## 2018-06-04 ENCOUNTER — Other Ambulatory Visit: Payer: Self-pay

## 2018-06-04 DIAGNOSIS — E559 Vitamin D deficiency, unspecified: Secondary | ICD-10-CM | POA: Diagnosis not present

## 2018-06-04 DIAGNOSIS — Z6841 Body Mass Index (BMI) 40.0 and over, adult: Secondary | ICD-10-CM

## 2018-06-04 DIAGNOSIS — R7303 Prediabetes: Secondary | ICD-10-CM

## 2018-06-04 NOTE — Progress Notes (Signed)
Office: [phone]  /  Fax: [phone] TeleHealth Visit:  BEYZA BELLINO has verbally consented to this TeleHealth visit today. The patient is located at home, the provider is located at the [facility] office. The participants in this visit include the listed provider and patient. The visit was conducted today via webex.  HPI:   Chief Complaint: OBESITY Makalynn is here to discuss her progress with her obesity treatment plan. She is on the Category 2 plan and is following her eating plan approximately 80 % of the time. She states she is walking 2.5 miles 3-4 times per week. Adalynd has had a good couple of weeks. She did conceal and carry license and went to a shooting range. 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 Her weight is of 264-263 lbs two days ago. She had 2 episodes of feelings of low blood sugar, not consistent in terms of the time of day.  We were unable to weigh the patient today for this TeleHealth visit. She feels as if she has lost 1 lb since her last visit. She has lost 31 lbs since starting treatment with [place].  Pre-Diabetes Brandilyn has a diagnosis of pre-diabetes based on her elevated Hgb A1c of 6.1 in January and was informed this puts her at greater risk of developing diabetes. She is taking metformin currently and notes 1 episode of hypoglycemia with symptoms. She continues to work on diet and exercise to decrease risk of diabetes.   Vitamin D Deficiency Banessa has a diagnosis of vitamin D deficiency. She was previously on prescription Vit D and now on OTC. She notes fatigue and denies nausea, vomiting or muscle weakness.  ASSESSMENT AND PLAN:  Prediabetes  Vitamin D deficiency  Class 3 severe obesity with serious comorbidity and body mass index (BMI) of 40.0 to 44.9 in adult, unspecified obesity type ([place])  PLAN:  Pre-Diabetes Sharnese will continue to work on weight loss, exercise, and decreasing simple  carbohydrates in her diet to help decrease the risk of diabetes. We dicussed metformin including benefits and risks. She was informed that eating too many simple carbohydrates or too many calories at one sitting increases the likelihood of GI side effects. Deandra agrees to continue taking metformin, and we will repeat labs at her first in person appointment. Racheal agrees to follow up with our clinic in 2 weeks as directed to monitor her progress.  Vitamin D Deficiency Kennadee was informed that low vitamin D levels contributes to fatigue and are associated with obesity, breast, and colon cancer. Chasitee agrees to continue taking OTC Vit D and will follow up for routine testing of vitamin D, at least 2-3 times per year. She was informed of the risk of over-replacement of vitamin D and agrees to not increase her dose unless she discusses this with [place] first. We will repeat labs at her first in person appointment. Mallerie agrees to follow up with our clinic in 2 weeks.  Obesity Heidie is currently in the action stage of change. As such, her goal is to continue with weight loss efforts She has agreed to follow the Category 2 plan + 100 calories and 8 oz of protein a day Nashya has been instructed to work up to a goal of 150 minutes of combined cardio and strengthening exercise per week for weight loss and overall health benefits. We discussed the following Behavioral Modification Strategies today: increasing lean protein intake, increasing vegetables and work on meal planning and easy  cooking plans, keeping healthy foods in the home, better snacking choices, and planning for success   Harriet has agreed to follow up with our clinic in 2 weeks. She was informed of the importance of frequent follow up visits to maximize her success with intensive lifestyle modifications for her multiple health conditions.  ALLERGIES: Allergies  Allergen Reactions  . Lisinopril Swelling    Swelling of the tongue  . Penicillins      Pt told as a child-unknown reaction.  . Tetanus Toxoids Swelling    Swelling at site of injection  . Norvasc [Amlodipine Besylate] Rash    MEDICATIONS: Current Outpatient Medications on File Prior to Visit  Medication Sig Dispense Refill  . Black Cohosh 40 MG CAPS Take 1 capsule by mouth daily.    . Cholecalciferol (VITAMIN D-3) 5000 units TABS Take 1 tablet by mouth daily. 30 tablet   . losartan-hydrochlorothiazide (HYZAAR) 100-25 MG tablet Take 1 tablet by mouth daily. 90 tablet 1  . metFORMIN (GLUCOPHAGE) 500 MG tablet Take 1 tablet (500 mg total) by mouth daily with breakfast. 30 tablet 0  . Multiple Vitamin (MULTIVITAMIN) tablet Take 1 tablet by mouth daily.    . norethindrone (MICRONOR,CAMILA,ERRIN) 0.35 MG tablet Take 1 tablet by mouth daily.     No current facility-administered medications on file prior to visit.     PAST MEDICAL HISTORY: Past Medical History:  Diagnosis Date  . Back pain   . Hip pain   . Hypertension   . Obesity   . Prediabetes   . Swelling    bilat LE    PAST SURGICAL HISTORY: Past Surgical History:  Procedure Laterality Date  . CHOLECYSTECTOMY    . GASTRIC BYPASS    . KNEE SURGERY  2002   Ligament repair    SOCIAL HISTORY: Social History   Tobacco Use  . Smoking status: Never Smoker  . Smokeless tobacco: Never Used  Substance Use Topics  . Alcohol use: No  . Drug use: No    FAMILY HISTORY: Family History  Problem Relation Age of Onset  . Diabetes Mother   . Hypertension Mother   . Heart disease Mother   . Stroke Mother   . Kidney disease Mother   . Obesity Mother   . Cancer Maternal Aunt        lung  . Cancer Maternal Uncle        lung    ROS: Review of Systems  Constitutional: Positive for malaise/fatigue and weight loss.  Gastrointestinal: Negative for nausea and vomiting.  Musculoskeletal:       Negative muscle weakness  Endo/Heme/Allergies:       Negative hypoglycemia    PHYSICAL EXAM: Pt in no acute distress   RECENT LABS AND TESTS: BMET    Component Value Date/Time   NA 141 01/31/2018 0840   K 3.6 01/31/2018 0840   CL 99 01/31/2018 0840   CO2 26 01/31/2018 0840   GLUCOSE 101 (H) 01/31/2018 0840   GLUCOSE 92 08/28/2017 1623   BUN 16 01/31/2018 0840   CREATININE 0.95 01/31/2018 0840   CALCIUM 8.8 01/31/2018 0840   GFRNONAA 68 01/31/2018 0840   GFRAA 79 01/31/2018 0840   Lab Results  Component Value Date   HGBA1C 6.1 (H) 01/31/2018   HGBA1C 6.0 (H) 10/08/2017   HGBA1C 6.3 06/25/2017   HGBA1C 6.3 01/22/2017   HGBA1C 6.0 07/11/2016   Lab Results  Component Value Date   INSULIN 13.4 01/31/2018   INSULIN 10.6 10/08/2017  CBC    Component Value Date/Time   WBC 6.9 10/08/2017 1239   WBC 6.0 06/25/2017 1541   RBC 4.66 10/08/2017 1239   RBC 4.70 06/25/2017 1541   HGB 12.3 10/08/2017 1239   HCT 37.2 10/08/2017 1239   PLT 262.0 06/25/2017 1541   MCV 80 10/08/2017 1239   MCH 26.4 (L) 10/08/2017 1239   MCHC 33.1 10/08/2017 1239   MCHC 32.5 06/25/2017 1541   RDW 15.1 10/08/2017 1239   LYMPHSABS 1.6 10/08/2017 1239   MONOABS 0.5 06/25/2017 1541   EOSABS 0.2 10/08/2017 1239   BASOSABS 0.0 10/08/2017 1239   Iron/TIBC/Ferritin/ %Sat    Component Value Date/Time   IRON 162 (H) 01/22/2017 1152   FERRITIN 14.5 01/22/2017 1152   Lipid Panel     Component Value Date/Time   CHOL 112 10/08/2017 1239   TRIG 69 10/08/2017 1239   HDL 50 10/08/2017 1239   CHOLHDL 2 06/25/2017 1541   VLDL 15.0 06/25/2017 1541   LDLCALC 48 10/08/2017 1239   Hepatic Function Panel     Component Value Date/Time   PROT 6.9 01/31/2018 0840   ALBUMIN 3.5 (L) 01/31/2018 0840   AST 19 01/31/2018 0840   ALT 17 01/31/2018 0840   ALKPHOS 84 01/31/2018 0840   BILITOT 0.4 01/31/2018 0840      Component Value Date/Time   TSH 1.480 10/08/2017 1239   TSH 1.24 06/25/2017 1541   TSH 1.53 07/11/2016 1150      I, Trixie Dredge, am acting as transcriptionist for Ilene Qua, MD  I have reviewed  the above documentation for accuracy and completeness, and I agree with the above. - Ilene Qua, MD

## 2018-06-10 ENCOUNTER — Other Ambulatory Visit: Payer: Self-pay | Admitting: Obstetrics and Gynecology

## 2018-06-10 DIAGNOSIS — Z1231 Encounter for screening mammogram for malignant neoplasm of breast: Secondary | ICD-10-CM

## 2018-06-17 ENCOUNTER — Encounter (INDEPENDENT_AMBULATORY_CARE_PROVIDER_SITE_OTHER): Payer: Self-pay | Admitting: Family Medicine

## 2018-06-17 ENCOUNTER — Other Ambulatory Visit: Payer: Self-pay

## 2018-06-17 ENCOUNTER — Ambulatory Visit (INDEPENDENT_AMBULATORY_CARE_PROVIDER_SITE_OTHER): Payer: BC Managed Care – PPO | Admitting: Family Medicine

## 2018-06-17 DIAGNOSIS — E559 Vitamin D deficiency, unspecified: Secondary | ICD-10-CM | POA: Diagnosis not present

## 2018-06-17 DIAGNOSIS — R7303 Prediabetes: Secondary | ICD-10-CM

## 2018-06-17 DIAGNOSIS — Z6841 Body Mass Index (BMI) 40.0 and over, adult: Secondary | ICD-10-CM

## 2018-06-17 DIAGNOSIS — E66813 Obesity, class 3: Secondary | ICD-10-CM

## 2018-06-17 NOTE — Progress Notes (Signed)
Office: [phone]  /  Fax: [phone] TeleHealth Visit:  Roberta [name] has verbally consented to this TeleHealth visit today. The patient is located at home, the provider is located at the [facility] office. The participants in this visit include the listed provider and patient. The visit was conducted today via Webex.  HPI:   Chief Complaint: OBESITY Roberta [name] is here to discuss her progress with her obesity treatment plan. She is on the  follow the Category 2 plan +100 calories and 8oz of protein at dinner and is following her eating plan approximately 85-90 % of the time. She states she is exercising by walking for 30 minutes 3-4 times per week. Roberta [name] is paying more attention to getting all her protein in and trying to work on getting everything in. Her work is over for year so planning to relax for most of the summer.  We were unable to weigh the patient today for this TeleHealth visit. She feels as if she has maintained weight since her last visit. She has lost 31 lbs since starting treatment with [place].  Vitamin D deficiency Roberta [name] has a diagnosis of vitamin D deficiency. She is currently taking vit D and denies nausea, vomiting or muscle weakness. She reports fatigue.   Pre-Diabetes Roberta [name] has a diagnosis of prediabetes based on her elevated HgA1c of 6.1 on 01/31/18 and was informed this puts her at greater risk of developing diabetes. She is taking metformin currently and continues to work on diet and exercise to decrease risk of diabetes. She denies nausea or hypoglycemia.   ASSESSMENT AND PLAN:  Vitamin D deficiency  Prediabetes  Class 3 severe obesity with serious comorbidity and body mass index (BMI) of 40.0 to 44.9 in adult, unspecified obesity type ([place])  PLAN: Vitamin D Deficiency Zaydee was informed that low vitamin D levels contributes to fatigue and are associated with obesity, breast, and colon cancer. She agrees to continue to take prescription Vit D  5,000 units dailyand will follow up for routine testing of vitamin D, at least 2-3 times per year. She was informed of the risk of over-replacement of vitamin D and agrees to not increase her dose unless she discusses this with [place] first. Agrees to follow up with our clinic as directed, will need repeat labs done.   Pre-Diabetes Roberta [name] will continue to work on weight loss, exercise, and decreasing simple carbohydrates in her diet to help decrease the risk of diabetes. We dicussed metformin including benefits and risks. She was informed that eating too many simple carbohydrates or too many calories at one sitting increases the likelihood of GI side effects. Roberta [name] agrees to continue Metformin as prescribed. Roberta [name] agreed to follow up with [place] as directed to monitor her progress. We will repeat labs at next in person appointment.    Obesity Roberta [name] is currently in the action stage of change. As such, her goal is to continue with weight loss efforts She has agreed to follow the Category 2 plan +100 calories and 8oz of protein at dinner.  Roberta [name] has been instructed to work up to a goal of 150 minutes of combined cardio and strengthening exercise per week for weight loss and overall health benefits. We discussed the following Behavioral Modification Stratagies today:planning for success, keeping healthy foods in the home,  increasing lean protein intake, increasing vegetables and work on meal planning and easy cooking plans.   Roberta [name] has agreed to follow up with our clinic in 2 weeks. She was informed  of the importance of frequent follow up visits to maximize her success with intensive lifestyle modifications for her multiple health conditions.  ALLERGIES: Allergies  Allergen Reactions  . Lisinopril Swelling    Swelling of the tongue  . Penicillins     Pt told as a child-unknown reaction.  . Tetanus Toxoids Swelling    Swelling at site of injection  . Norvasc [Amlodipine Besylate] Rash    MEDICATIONS:  Current Outpatient Medications on File Prior to Visit  Medication Sig Dispense Refill  . Black Cohosh 40 MG CAPS Take 1 capsule by mouth daily.    . Cholecalciferol (VITAMIN D-3) 5000 units TABS Take 1 tablet by mouth daily. 30 tablet   . losartan-hydrochlorothiazide (HYZAAR) 100-25 MG tablet Take 1 tablet by mouth daily. 90 tablet 1  . metFORMIN (GLUCOPHAGE) 500 MG tablet Take 1 tablet (500 mg total) by mouth daily with breakfast. 30 tablet 0  . Multiple Vitamin (MULTIVITAMIN) tablet Take 1 tablet by mouth daily.    . norethindrone (MICRONOR,CAMILA,ERRIN) 0.35 MG tablet Take 1 tablet by mouth daily.     No current facility-administered medications on file prior to visit.     PAST MEDICAL HISTORY: Past Medical History:  Diagnosis Date  . Back pain   . Hip pain   . Hypertension   . Obesity   . Prediabetes   . Swelling    bilat LE    PAST SURGICAL HISTORY: Past Surgical History:  Procedure Laterality Date  . CHOLECYSTECTOMY    . GASTRIC BYPASS    . KNEE SURGERY  2002   Ligament repair    SOCIAL HISTORY: Social History   Tobacco Use  . Smoking status: Never Smoker  . Smokeless tobacco: Never Used  Substance Use Topics  . Alcohol use: No  . Drug use: No    FAMILY HISTORY: Family History  Problem Relation Age of Onset  . Diabetes Mother   . Hypertension Mother   . Heart disease Mother   . Stroke Mother   . Kidney disease Mother   . Obesity Mother   . Cancer Maternal Aunt        lung  . Cancer Maternal Uncle        lung    ROS: Review of Systems  Constitutional: Positive for malaise/fatigue.  Gastrointestinal: Negative for nausea and vomiting.  Musculoskeletal:       Negative for muscle weakness  Endo/Heme/Allergies:       Negative for hypoglycemia    PHYSICAL EXAM: Pt in no acute distress  RECENT LABS AND TESTS: BMET    Component Value Date/Time   NA 141 01/31/2018 0840   K 3.6 01/31/2018 0840   CL 99 01/31/2018 0840   CO2 26 01/31/2018 0840    GLUCOSE 101 (H) 01/31/2018 0840   GLUCOSE 92 08/28/2017 1623   BUN 16 01/31/2018 0840   CREATININE 0.95 01/31/2018 0840   CALCIUM 8.8 01/31/2018 0840   GFRNONAA 68 01/31/2018 0840   GFRAA 79 01/31/2018 0840   Lab Results  Component Value Date   HGBA1C 6.1 (H) 01/31/2018   HGBA1C 6.0 (H) 10/08/2017   HGBA1C 6.3 06/25/2017   HGBA1C 6.3 01/22/2017   HGBA1C 6.0 07/11/2016   Lab Results  Component Value Date   INSULIN 13.4 01/31/2018   INSULIN 10.6 10/08/2017   CBC    Component Value Date/Time   WBC 6.9 10/08/2017 1239   WBC 6.0 06/25/2017 1541   RBC 4.66 10/08/2017 1239   RBC 4.70 06/25/2017 1541  HGB 12.3 10/08/2017 1239   HCT 37.2 10/08/2017 1239   PLT 262.0 06/25/2017 1541   MCV 80 10/08/2017 1239   MCH 26.4 (L) 10/08/2017 1239   MCHC 33.1 10/08/2017 1239   MCHC 32.5 06/25/2017 1541   RDW 15.1 10/08/2017 1239   LYMPHSABS 1.6 10/08/2017 1239   MONOABS 0.5 06/25/2017 1541   EOSABS 0.2 10/08/2017 1239   BASOSABS 0.0 10/08/2017 1239   Iron/TIBC/Ferritin/ %Sat    Component Value Date/Time   IRON 162 (H) 01/22/2017 1152   FERRITIN 14.5 01/22/2017 1152   Lipid Panel     Component Value Date/Time   CHOL 112 10/08/2017 1239   TRIG 69 10/08/2017 1239   HDL 50 10/08/2017 1239   CHOLHDL 2 06/25/2017 1541   VLDL 15.0 06/25/2017 1541   LDLCALC 48 10/08/2017 1239   Hepatic Function Panel     Component Value Date/Time   PROT 6.9 01/31/2018 0840   ALBUMIN 3.5 (L) 01/31/2018 0840   AST 19 01/31/2018 0840   ALT 17 01/31/2018 0840   ALKPHOS 84 01/31/2018 0840   BILITOT 0.4 01/31/2018 0840      Component Value Date/Time   TSH 1.480 10/08/2017 1239   TSH 1.24 06/25/2017 1541   TSH 1.53 07/11/2016 1150   Results for TYKIRA, WACHS (MRN [id number]) as of 06/17/2018 20:28  Ref. Range 01/31/2018 08:40  Vitamin D, 25-Hydroxy Latest Ref Range: 30.0 - 100.0 ng/mL 61.3     I, Renee Ramus, am acting as Location manager for Ilene Qua, MD   I have reviewed  the above documentation for accuracy and completeness, and I agree with the above. - Ilene Qua, MD

## 2018-07-01 ENCOUNTER — Other Ambulatory Visit: Payer: Self-pay

## 2018-07-01 ENCOUNTER — Telehealth (INDEPENDENT_AMBULATORY_CARE_PROVIDER_SITE_OTHER): Payer: BC Managed Care – PPO | Admitting: Family Medicine

## 2018-07-01 ENCOUNTER — Encounter (INDEPENDENT_AMBULATORY_CARE_PROVIDER_SITE_OTHER): Payer: Self-pay | Admitting: Family Medicine

## 2018-07-01 DIAGNOSIS — I10 Essential (primary) hypertension: Secondary | ICD-10-CM | POA: Diagnosis not present

## 2018-07-01 DIAGNOSIS — Z6841 Body Mass Index (BMI) 40.0 and over, adult: Secondary | ICD-10-CM | POA: Diagnosis not present

## 2018-07-01 DIAGNOSIS — R7303 Prediabetes: Secondary | ICD-10-CM | POA: Diagnosis not present

## 2018-07-01 MED ORDER — METFORMIN HCL 500 MG PO TABS: 500.0000 mg | ORAL_TABLET | Freq: Every day | ORAL | 0 refills | Status: DC

## 2018-07-02 NOTE — Progress Notes (Signed)
Office: [phone]  /  Fax: [phone] TeleHealth Visit:  Roberta [name] has verbally consented to this TeleHealth visit today. The patient is located at home, the provider is located at the [facility] office. The participants in this visit include the listed provider and patient. The visit was conducted today via webex.  HPI:   Chief Complaint: OBESITY Roberta [name] is here to discuss her progress with her obesity treatment plan. She is on the Category 2 plan + 100 calories with 8 oz of protein at dinner and is following her eating plan approximately 90 % of the time. She states she is doing cardio Youtube for 55 minutes 3-4 times per week. Roberta [name] weight is of 263 lbs this morning. She did go get sundresses in the past 2 weeks, which she was excited about. She is not making any plans for the 4th of July. She is not having any issues getting food on the plan, she notes only occasional hunger.  We were unable to weigh the patient today for this TeleHealth visit. She feels as if she has lost 2 lbs since her last visit. She has lost 31 lbs since starting treatment with [place].  Pre-Diabetes Roberta [name] has a diagnosis of pre-diabetes based on her elevated Hgb A1c and was informed this puts her at greater risk of developing diabetes. She denies GI side effects of metformin and notes improvement with cravings. She continues to work on diet and exercise to decrease risk of diabetes. She denies hypoglycemia.  Hypertension Roberta [name] is a 53 y.o. female with hypertension. Roberta [name]'s blood pressure is well controlled. She denies chest pain, chest pressure, or headaches. She is working on weight loss to help control her blood pressure with the goal of decreasing her risk of heart attack and stroke.   ASSESSMENT AND PLAN:  Prediabetes - Plan: metFORMIN (GLUCOPHAGE) 500 MG tablet  Essential hypertension  Class 3 severe obesity with serious comorbidity and body mass index (BMI) of 40.0 to 44.9 in  adult, unspecified obesity type ([place])  PLAN:  Pre-Diabetes Roberta [name] will continue to work on weight loss, exercise, and decreasing simple carbohydrates in her diet to help decrease the risk of diabetes. We dicussed metformin including benefits and risks. She was informed that eating too many simple carbohydrates or too many calories at one sitting increases the likelihood of GI side effects. Roberta [name] agrees to continue taking metformin 500 mg PO q AM #30 and we will refill for 1 month. Roberta [name] agrees to follow up with our clinic in 2 weeks as directed to monitor her progress.  Hypertension We discussed sodium restriction, working on healthy weight loss, and a regular exercise program as the means to achieve improved blood pressure control. Roberta [name] agreed with this plan and agreed to follow up as directed. We will continue to monitor her blood pressure as well as her progress with the above lifestyle modifications. Roberta [name] agrees to continue her current medications and will watch for signs of hypotension as she continues her lifestyle modifications. Roberta [name] agrees to follow up with our clinic in 2 weeks.  Obesity Roberta [name] is currently in the action stage of change. As such, her goal is to continue with weight loss efforts She has agreed to follow the Category 2 plan + 100 calories Roberta [name] has been instructed to work up to a goal of 150 minutes of combined cardio and strengthening exercise per week for weight loss and overall health benefits. We discussed the following Behavioral Modification Strategies today: increasing  lean protein intake, increasing vegetables and work on meal planning and easy cooking plans, keeping healthy foods in the home, better snacking choices, and planning for success   Roberta [name] has agreed to follow up with our clinic in 2 weeks. She was informed of the importance of frequent follow up visits to maximize her success with intensive lifestyle modifications for her multiple health conditions.   ALLERGIES: Allergies  Allergen Reactions  . Lisinopril Swelling    Swelling of the tongue  . Penicillins     Pt told as a child-unknown reaction.  . Tetanus Toxoids Swelling    Swelling at site of injection  . Norvasc [Amlodipine Besylate] Rash    MEDICATIONS: Current Outpatient Medications on File Prior to Visit  Medication Sig Dispense Refill  . Biotin 10000 MCG TABS Take 10,000 mcg by mouth.    . Black Cohosh 40 MG CAPS Take 1 capsule by mouth daily.    . Cholecalciferol (VITAMIN D-3) 5000 units TABS Take 1 tablet by mouth daily. 30 tablet   . losartan-hydrochlorothiazide (HYZAAR) 100-25 MG tablet Take 1 tablet by mouth daily. 90 tablet 1  . Multiple Vitamin (MULTIVITAMIN) tablet Take 1 tablet by mouth daily.    . norethindrone (MICRONOR,CAMILA,ERRIN) 0.35 MG tablet Take 1 tablet by mouth daily.    . Omega-3 Fatty Acids (OMEGA 3 500 PO) Take 500 mg by mouth.    . vitamin E 100 UNIT capsule Take 180 Units by mouth daily.    Marland Kitchen zinc gluconate 50 MG tablet Take 50 mg by mouth daily.     No current facility-administered medications on file prior to visit.     PAST MEDICAL HISTORY: Past Medical History:  Diagnosis Date  . Back pain   . Hip pain   . Hypertension   . Obesity   . Prediabetes   . Swelling    bilat LE    PAST SURGICAL HISTORY: Past Surgical History:  Procedure Laterality Date  . CHOLECYSTECTOMY    . GASTRIC BYPASS    . KNEE SURGERY  2002   Ligament repair    SOCIAL HISTORY: Social History   Tobacco Use  . Smoking status: Never Smoker  . Smokeless tobacco: Never Used  Substance Use Topics  . Alcohol use: No  . Drug use: No    FAMILY HISTORY: Family History  Problem Relation Age of Onset  . Diabetes Mother   . Hypertension Mother   . Heart disease Mother   . Stroke Mother   . Kidney disease Mother   . Obesity Mother   . Cancer Maternal Aunt        lung  . Cancer Maternal Uncle        lung    ROS: Review of Systems  Constitutional:  Positive for weight loss.  Cardiovascular: Negative for chest pain.       Negative chest pressure  Neurological: Negative for headaches.  Endo/Heme/Allergies:       Negative hypoglycemia    PHYSICAL EXAM: Pt in no acute distress  RECENT LABS AND TESTS: BMET    Component Value Date/Time   NA 141 01/31/2018 0840   K 3.6 01/31/2018 0840   CL 99 01/31/2018 0840   CO2 26 01/31/2018 0840   GLUCOSE 101 (H) 01/31/2018 0840   GLUCOSE 92 08/28/2017 1623   BUN 16 01/31/2018 0840   CREATININE 0.95 01/31/2018 0840   CALCIUM 8.8 01/31/2018 0840   GFRNONAA 68 01/31/2018 0840   GFRAA 79 01/31/2018 0840   Lab  Results  Component Value Date   HGBA1C 6.1 (H) 01/31/2018   HGBA1C 6.0 (H) 10/08/2017   HGBA1C 6.3 06/25/2017   HGBA1C 6.3 01/22/2017   HGBA1C 6.0 07/11/2016   Lab Results  Component Value Date   INSULIN 13.4 01/31/2018   INSULIN 10.6 10/08/2017   CBC    Component Value Date/Time   WBC 6.9 10/08/2017 1239   WBC 6.0 06/25/2017 1541   RBC 4.66 10/08/2017 1239   RBC 4.70 06/25/2017 1541   HGB 12.3 10/08/2017 1239   HCT 37.2 10/08/2017 1239   PLT 262.0 06/25/2017 1541   MCV 80 10/08/2017 1239   MCH 26.4 (L) 10/08/2017 1239   MCHC 33.1 10/08/2017 1239   MCHC 32.5 06/25/2017 1541   RDW 15.1 10/08/2017 1239   LYMPHSABS 1.6 10/08/2017 1239   MONOABS 0.5 06/25/2017 1541   EOSABS 0.2 10/08/2017 1239   BASOSABS 0.0 10/08/2017 1239   Iron/TIBC/Ferritin/ %Sat    Component Value Date/Time   IRON 162 (H) 01/22/2017 1152   FERRITIN 14.5 01/22/2017 1152   Lipid Panel     Component Value Date/Time   CHOL 112 10/08/2017 1239   TRIG 69 10/08/2017 1239   HDL 50 10/08/2017 1239   CHOLHDL 2 06/25/2017 1541   VLDL 15.0 06/25/2017 1541   LDLCALC 48 10/08/2017 1239   Hepatic Function Panel     Component Value Date/Time   PROT 6.9 01/31/2018 0840   ALBUMIN 3.5 (L) 01/31/2018 0840   AST 19 01/31/2018 0840   ALT 17 01/31/2018 0840   ALKPHOS 84 01/31/2018 0840   BILITOT 0.4  01/31/2018 0840      Component Value Date/Time   TSH 1.480 10/08/2017 1239   TSH 1.24 06/25/2017 1541   TSH 1.53 07/11/2016 1150      I, Trixie Dredge, am acting as Location manager for Ilene Qua, MD  I have reviewed the above documentation for accuracy and completeness, and I agree with the above. - Ilene Qua, MD

## 2018-07-16 ENCOUNTER — Ambulatory Visit (INDEPENDENT_AMBULATORY_CARE_PROVIDER_SITE_OTHER): Payer: BC Managed Care – PPO | Admitting: Family Medicine

## 2018-07-16 ENCOUNTER — Other Ambulatory Visit: Payer: Self-pay

## 2018-07-16 ENCOUNTER — Encounter (INDEPENDENT_AMBULATORY_CARE_PROVIDER_SITE_OTHER): Payer: Self-pay | Admitting: Family Medicine

## 2018-07-16 VITALS — BP 112/74 | HR 84 | Temp 98.2°F | Ht 66.0 in | Wt 263.0 lb

## 2018-07-16 DIAGNOSIS — D509 Iron deficiency anemia, unspecified: Secondary | ICD-10-CM

## 2018-07-16 DIAGNOSIS — Z9189 Other specified personal risk factors, not elsewhere classified: Secondary | ICD-10-CM

## 2018-07-16 DIAGNOSIS — I10 Essential (primary) hypertension: Secondary | ICD-10-CM | POA: Diagnosis not present

## 2018-07-16 DIAGNOSIS — Z6841 Body Mass Index (BMI) 40.0 and over, adult: Secondary | ICD-10-CM

## 2018-07-16 DIAGNOSIS — E559 Vitamin D deficiency, unspecified: Secondary | ICD-10-CM | POA: Diagnosis not present

## 2018-07-16 DIAGNOSIS — R7303 Prediabetes: Secondary | ICD-10-CM | POA: Diagnosis not present

## 2018-07-17 NOTE — Progress Notes (Signed)
Office: [phone]  /  Fax: [phone]   HPI:   Chief Complaint: OBESITY Roberta [name] is here to discuss her progress with her obesity treatment plan. She is on the Category 2 plan + 100 calories and is following her eating plan approximately 80-85 % of the time. She states she is exercising with Youtube for 30-35 minutes 3 times per week. This is Roberta [name]'s first in office appointment since prior to start of pandemic. Today is especially hard as it is the 4th anniversary of her mother's death date. She states her home scale says 265 lbs today. She has a consultation for a repeat colonoscopy next week. She has been cravings a lot of carbohydrates. She denies hunger unless she skips parts of a meal.  Her weight is 263 lb (119.3 kg) today and has gained 2 lbs since her last visit. She has lost 29 lbs since starting treatment with [place].  Pre-Diabetes Roberta [name] has a diagnosis of pre-diabetes based on her elevated Hgb A1c and was informed this puts her at greater risk of developing diabetes. She notes carbohydrate cravings and she is on metformin, and denies GI side effects. She continues to work on diet and exercise to decrease risk of diabetes. She denies hypoglycemia.  At risk for diabetes Roberta [name] is at higher than average risk for developing diabetes due to her obesity and pre-diabetes. She currently denies polyuria or polydipsia.  Hypertension BRAD MCGAUGHY is a 53 y.o. female with hypertension. Roberta [name]'s blood pressure is well controlled today. She denies chest pain, chest pressure, or headaches. She is currently on Hyzaar. She is working on weight loss to help control her blood pressure with the goal of decreasing her risk of heart attack and stroke.  Microcytic Anemia Roberta [name] has a diagnosis of anemia. Her MCV was at 80 in October 2019 and H/H within normal limits. She is not on iron supplementation.   Vitamin D Deficiency Roberta [name] has a diagnosis of vitamin D deficiency. She is currently taking OTC Vit  D. She notes fatigue and denies nausea, vomiting or muscle weakness.  ASSESSMENT AND PLAN:  Prediabetes - Plan: Comprehensive metabolic panel, Hemoglobin A1c  Essential hypertension  Vitamin D deficiency - Plan: VITAMIN D 25 Hydroxy (Vit-D Deficiency, Fractures), Vitamin B12  Microcytic anemia - Plan: CBC With Differential, Vitamin B12  At risk for diabetes mellitus  Class 3 severe obesity with serious comorbidity and body mass index (BMI) of 40.0 to 44.9 in adult, unspecified obesity type ([place])  PLAN:  Pre-Diabetes Roberta [name] will continue to work on weight loss, exercise, and decreasing simple carbohydrates in her diet to help decrease the risk of diabetes. We dicussed metformin including benefits and risks. She was informed that eating too many simple carbohydrates or too many calories at one sitting increases the likelihood of GI side effects. [name] agrees to continue taking metformin, and we will check Hgb A1c, insulin, and Vit B12 level today. Roberta [name] agrees to follow up with our clinic in 2 weeks as directed to monitor her progress.  Diabetes risk counseling Roberta [name] was given extended (15 minutes) diabetes prevention counseling today. She is 53 y.o. female and has risk factors for diabetes including obesity and pre-diabetes. We discussed intensive lifestyle modifications today with an emphasis on weight loss as well as increasing exercise and decreasing simple carbohydrates in her diet.  Hypertension We discussed sodium restriction, working on healthy weight loss, and a regular exercise program as the means to achieve improved blood pressure control. Roberta [name] agreed with this plan  and agreed to follow up as directed. We will continue to monitor her blood pressure as well as her progress with the above lifestyle modifications. Roberta [name] agrees to continue her medications and will watch for signs of hypotension as she continues her lifestyle modifications. We will check CMP today. Roberta [name] agrees to  follow up with our clinic in 2 weeks.   Microcytic Anemia The diagnosis of microcytic anemia was discussed with Roberta [name] and was explained in detail. She was given suggestions of iron rich foods and iron supplement was not prescribed. We will check CBC today. Roberta [name] agrees to follow up with our clinic in 2 weeks.  Vitamin D Deficiency Roberta [name] was informed that low vitamin D levels contributes to fatigue and are associated with obesity, breast, and colon cancer. Roberta [name] agrees to continue taking OTC Vit D3 5,000 IU daily and will follow up for routine testing of vitamin D, at least 2-3 times per year. She was informed of the risk of over-replacement of vitamin D and agrees to not increase her dose unless she discusses this with [place] first. We will check Vit D level today. Roberta [name] agrees to follow up with our clinic in 2 weeks.  Obesity Roberta [name] is currently in the action stage of change. As such, her goal is to continue with weight loss efforts She has agreed to follow the Category 2 plan + 100 calories Roberta [name] has been instructed to work up to a goal of 150 minutes of combined cardio and strengthening exercise per week for weight loss and overall health benefits. We discussed the following Behavioral Modification Strategies today: increasing lean protein intake, increasing vegetables and work on meal planning and easy cooking plans, keeping healthy foods in the home, and planning for success We will repeat IC at her next appointment.  Roberta [name] has agreed to follow up with our clinic in 2 weeks. She was informed of the importance of frequent follow up visits to maximize her success with intensive lifestyle modifications for her multiple health conditions.  ALLERGIES: Allergies  Allergen Reactions  . Lisinopril Swelling    Swelling of the tongue  . Penicillins     Pt told as a child-unknown reaction.  . Tetanus Toxoids Swelling    Swelling at site of injection  . Norvasc [Amlodipine Besylate] Rash     MEDICATIONS: Current Outpatient Medications on File Prior to Visit  Medication Sig Dispense Refill  . Biotin 10000 MCG TABS Take 10,000 mcg by mouth.    . Black Cohosh 40 MG CAPS Take 1 capsule by mouth daily.    . Cholecalciferol (VITAMIN D-3) 5000 units TABS Take 1 tablet by mouth daily. 30 tablet   . losartan-hydrochlorothiazide (HYZAAR) 100-25 MG tablet Take 1 tablet by mouth daily. 90 tablet 1  . metFORMIN (GLUCOPHAGE) 500 MG tablet Take 1 tablet (500 mg total) by mouth daily with breakfast. 30 tablet 0  . Multiple Vitamin (MULTIVITAMIN) tablet Take 1 tablet by mouth daily.    . norethindrone (MICRONOR,CAMILA,ERRIN) 0.35 MG tablet Take 1 tablet by mouth daily.    . Omega-3 Fatty Acids (OMEGA 3 500 PO) Take 500 mg by mouth.    . vitamin E 100 UNIT capsule Take 180 Units by mouth daily.    Marland Kitchen zinc gluconate 50 MG tablet Take 50 mg by mouth daily.     No current facility-administered medications on file prior to visit.     PAST MEDICAL HISTORY: Past Medical History:  Diagnosis Date  . Back pain   . Hip pain   .  Hypertension   . Obesity   . Prediabetes   . Swelling    bilat LE    PAST SURGICAL HISTORY: Past Surgical History:  Procedure Laterality Date  . CHOLECYSTECTOMY    . GASTRIC BYPASS    . KNEE SURGERY  2002   Ligament repair    SOCIAL HISTORY: Social History   Tobacco Use  . Smoking status: Never Smoker  . Smokeless tobacco: Never Used  Substance Use Topics  . Alcohol use: No  . Drug use: No    FAMILY HISTORY: Family History  Problem Relation Age of Onset  . Diabetes Mother   . Hypertension Mother   . Heart disease Mother   . Stroke Mother   . Kidney disease Mother   . Obesity Mother   . Cancer Maternal Aunt        lung  . Cancer Maternal Uncle        lung    ROS: Review of Systems  Constitutional: Positive for malaise/fatigue. Negative for weight loss.  Cardiovascular: Negative for chest pain.       Negative chest pressure   Gastrointestinal: Negative for nausea and vomiting.  Genitourinary: Negative for frequency.  Musculoskeletal:       Negative muscle weakness  Neurological: Negative for headaches.  Endo/Heme/Allergies: Negative for polydipsia.       Negative hypoglycemia    PHYSICAL EXAM: Blood pressure 112/74, pulse 84, temperature 98.2 F (36.8 C), temperature source Oral, height 5\' 6"  (1.676 m), weight 263 lb (119.3 kg), last menstrual period 08/07/2014, SpO2 97 %. Body mass index is 42.45 kg/m. Physical Exam Vitals signs reviewed.  Constitutional:      Appearance: Normal appearance. She is obese.  Cardiovascular:     Rate and Rhythm: Normal rate.     Pulses: Normal pulses.  Pulmonary:     Effort: Pulmonary effort is normal.     Breath sounds: Normal breath sounds.  Musculoskeletal: Normal range of motion.  Skin:    General: Skin is warm and dry.  Neurological:     Mental Status: She is alert and oriented to person, place, and time.  Psychiatric:        Mood and Affect: Mood normal.        Behavior: Behavior normal.     RECENT LABS AND TESTS: BMET    Component Value Date/Time   NA 141 01/31/2018 0840   K 3.6 01/31/2018 0840   CL 99 01/31/2018 0840   CO2 26 01/31/2018 0840   GLUCOSE 101 (H) 01/31/2018 0840   GLUCOSE 92 08/28/2017 1623   BUN 16 01/31/2018 0840   CREATININE 0.95 01/31/2018 0840   CALCIUM 8.8 01/31/2018 0840   GFRNONAA 68 01/31/2018 0840   GFRAA 79 01/31/2018 0840   Lab Results  Component Value Date   HGBA1C 6.1 (H) 01/31/2018   HGBA1C 6.0 (H) 10/08/2017   HGBA1C 6.3 06/25/2017   HGBA1C 6.3 01/22/2017   HGBA1C 6.0 07/11/2016   Lab Results  Component Value Date   INSULIN 13.4 01/31/2018   INSULIN 10.6 10/08/2017   CBC    Component Value Date/Time   WBC 6.9 10/08/2017 1239   WBC 6.0 06/25/2017 1541   RBC 4.66 10/08/2017 1239   RBC 4.70 06/25/2017 1541   HGB 12.3 10/08/2017 1239   HCT 37.2 10/08/2017 1239   PLT 262.0 06/25/2017 1541   MCV 80  10/08/2017 1239   MCH 26.4 (L) 10/08/2017 1239   MCHC 33.1 10/08/2017 1239   MCHC 32.5 06/25/2017  1541   RDW 15.1 10/08/2017 1239   LYMPHSABS 1.6 10/08/2017 1239   MONOABS 0.5 06/25/2017 1541   EOSABS 0.2 10/08/2017 1239   BASOSABS 0.0 10/08/2017 1239   Iron/TIBC/Ferritin/ %Sat    Component Value Date/Time   IRON 162 (H) 01/22/2017 1152   FERRITIN 14.5 01/22/2017 1152   Lipid Panel     Component Value Date/Time   CHOL 112 10/08/2017 1239   TRIG 69 10/08/2017 1239   HDL 50 10/08/2017 1239   CHOLHDL 2 06/25/2017 1541   VLDL 15.0 06/25/2017 1541   LDLCALC 48 10/08/2017 1239   Hepatic Function Panel     Component Value Date/Time   PROT 6.9 01/31/2018 0840   ALBUMIN 3.5 (L) 01/31/2018 0840   AST 19 01/31/2018 0840   ALT 17 01/31/2018 0840   ALKPHOS 84 01/31/2018 0840   BILITOT 0.4 01/31/2018 0840      Component Value Date/Time   TSH 1.480 10/08/2017 1239   TSH 1.24 06/25/2017 1541   TSH 1.53 07/11/2016 1150      OBESITY BEHAVIORAL INTERVENTION VISIT  Today's visit was # 19   Starting weight: 292 lbs Starting date: 10/08/17 Today's weight : 263 lbs  Today's date: 07/16/2018 Total lbs lost to date: 98    ASK: We discussed the diagnosis of obesity with Jonnie Kind today and Charlsie agreed to give [place] permission to discuss obesity behavioral modification therapy today.  ASSESS: Bahar has the diagnosis of obesity and her BMI today is 42.47 Nayleen is in the action stage of change   ADVISE: Tyneka was educated on the multiple health risks of obesity as well as the benefit of weight loss to improve her health. She was advised of the need for long term treatment and the importance of lifestyle modifications to improve her current health and to decrease her risk of future health problems.  AGREE: Multiple dietary modification options and treatment options were discussed and  Autym agreed to follow the recommendations documented in the above note.  ARRANGE: Lucindy  was educated on the importance of frequent visits to treat obesity as outlined per CMS and USPSTF guidelines and agreed to schedule her next follow up appointment today.  I, Trixie Dredge, am acting as transcriptionist for Ilene Qua, MD  I have reviewed the above documentation for accuracy and completeness, and I agree with the above. - Ilene Qua, MD

## 2018-07-18 LAB — COMPREHENSIVE METABOLIC PANEL
ALT: 14 IU/L (ref 0–32)
AST: 20 IU/L (ref 0–40)
Albumin/Globulin Ratio: 1.1 — ABNORMAL LOW (ref 1.2–2.2)
Albumin: 3.6 g/dL — ABNORMAL LOW (ref 3.8–4.9)
Alkaline Phosphatase: 84 IU/L (ref 39–117)
BUN/Creatinine Ratio: 16 (ref 9–23)
BUN: 16 mg/dL (ref 6–24)
Bilirubin Total: 0.3 mg/dL (ref 0.0–1.2)
CO2: 25 mmol/L (ref 20–29)
Calcium: 9.1 mg/dL (ref 8.7–10.2)
Chloride: 101 mmol/L (ref 96–106)
Creatinine, Ser: 0.98 mg/dL (ref 0.57–1.00)
GFR calc Af Amer: 75 mL/min/{1.73_m2} (ref >59)
GFR calc non Af Amer: 65 mL/min/{1.73_m2} (ref >59)
Globulin, Total: 3.3 g/dL (ref 1.5–4.5)
Glucose: 95 mg/dL (ref 65–99)
Potassium: 4 mmol/L (ref 3.5–5.2)
Sodium: 139 mmol/L (ref 134–144)
Total Protein: 6.9 g/dL (ref 6.0–8.5)

## 2018-07-18 LAB — HEMOGLOBIN A1C
Est. average glucose Bld gHb Est-mCnc: 123 mg/dL
Hgb A1c MFr Bld: 5.9 % — ABNORMAL HIGH (ref 4.8–5.6)

## 2018-07-18 LAB — VITAMIN D 25 HYDROXY (VIT D DEFICIENCY, FRACTURES): Vit D, 25-Hydroxy: 70.2 ng/mL (ref 30.0–100.0)

## 2018-07-18 LAB — CBC WITH DIFFERENTIAL
Basophils Absolute: 0 10*3/uL (ref 0.0–0.2)
Basos: 1 %
EOS (ABSOLUTE): 0.1 10*3/uL (ref 0.0–0.4)
Eos: 1 %
Hematocrit: 38.5 % (ref 34.0–46.6)
Hemoglobin: 12.3 g/dL (ref 11.1–15.9)
Immature Grans (Abs): 0 10*3/uL (ref 0.0–0.1)
Immature Granulocytes: 0 %
Lymphocytes Absolute: 1.4 10*3/uL (ref 0.7–3.1)
Lymphs: 32 %
MCH: 26.4 pg — ABNORMAL LOW (ref 26.6–33.0)
MCHC: 31.9 g/dL (ref 31.5–35.7)
MCV: 83 fL (ref 79–97)
Monocytes Absolute: 0.4 10*3/uL (ref 0.1–0.9)
Monocytes: 9 %
Neutrophils Absolute: 2.5 10*3/uL (ref 1.4–7.0)
Neutrophils: 57 %
RBC: 4.66 x10E6/uL (ref 3.77–5.28)
RDW: 14.9 % (ref 11.7–15.4)
WBC: 4.4 10*3/uL (ref 3.4–10.8)

## 2018-07-18 LAB — VITAMIN B12: Vitamin B-12: 528 pg/mL (ref 232–1245)

## 2018-07-25 ENCOUNTER — Ambulatory Visit
Admission: RE | Admit: 2018-07-25 | Discharge: 2018-07-25 | Disposition: A | Discharge status: Home / Self Care | Payer: BC Managed Care – PPO | Source: Ambulatory Visit | Attending: Obstetrics and Gynecology | Admitting: Obstetrics and Gynecology

## 2018-07-25 ENCOUNTER — Other Ambulatory Visit: Payer: Self-pay

## 2018-07-25 DIAGNOSIS — Z1231 Encounter for screening mammogram for malignant neoplasm of breast: Secondary | ICD-10-CM

## 2018-07-28 ENCOUNTER — Other Ambulatory Visit: Payer: Self-pay | Admitting: Internal Medicine

## 2018-07-29 ENCOUNTER — Other Ambulatory Visit: Payer: Self-pay

## 2018-07-29 ENCOUNTER — Ambulatory Visit (INDEPENDENT_AMBULATORY_CARE_PROVIDER_SITE_OTHER): Payer: BC Managed Care – PPO | Admitting: Family Medicine

## 2018-07-29 VITALS — BP 116/73 | HR 76 | Temp 98.2°F | Ht 66.0 in | Wt 265.0 lb

## 2018-07-29 DIAGNOSIS — I10 Essential (primary) hypertension: Secondary | ICD-10-CM

## 2018-07-29 DIAGNOSIS — Z6841 Body Mass Index (BMI) 40.0 and over, adult: Secondary | ICD-10-CM

## 2018-07-29 DIAGNOSIS — Z9189 Other specified personal risk factors, not elsewhere classified: Secondary | ICD-10-CM | POA: Diagnosis not present

## 2018-07-29 DIAGNOSIS — R7303 Prediabetes: Secondary | ICD-10-CM

## 2018-07-29 MED ORDER — METFORMIN HCL 500 MG PO TABS: 500.0000 mg | ORAL_TABLET | Freq: Every day | ORAL | 0 refills | Status: DC

## 2018-07-30 NOTE — Progress Notes (Signed)
Office: [phone]  /  Fax: [phone]   HPI:   Chief Complaint: OBESITY Roberta [name] is here to discuss her progress with her obesity treatment plan. She is on the Category 2 plan +100 calories and is following her eating plan approximately 80 to 85 % of the time. She states she is exercising 0 minutes 0 times per week. Roberta [name] voices she isn't feeling the best today, and she does struggle with bouts of depression. Roberta [name] has been more on the plan in the past few weeks. Roberta [name] had tried starting to do more physical activity, but she may have tweaked her back so she hasn't been exercising. Her weight is 265 lb (120.2 kg) today and has had a weight gain of 2 pounds over a period of 2 weeks since her last visit. She has lost 27 lbs since starting treatment with [place].  Pre-Diabetes Roberta [name] has a diagnosis of prediabetes based on her elevated Hgb A1c and was informed this puts her at greater risk of developing diabetes. Her last A1c was at 5.9 (improved from 6.1). She is taking metformin currently. Roberta [name] is still experiencing some emotional eating, especially with the end of her relationship. Roberta [name] continues to work on diet and exercise to decrease risk of diabetes. She denies nausea or hypoglycemia.  At risk for diabetes Roberta [name] is at higher than average risk for developing diabetes due to her obesity and prediabetes. She currently denies polyuria or polydipsia.  Hypertension Roberta [name] is a 53 y.o. female with hypertension. Roberta [name] denies chest pain, chest pressure or headache. She is working weight loss to help control her blood pressure with the goal of decreasing her risk of heart attack and stroke. Roberta [name] blood pressure is better controlled.  ASSESSMENT AND PLAN:  Essential hypertension  Prediabetes - Plan: metFORMIN (GLUCOPHAGE) 500 MG tablet  At risk for diabetes mellitus  Class 3 severe obesity with serious comorbidity and body mass index (BMI) of 40.0 to 44.9 in adult, unspecified  obesity type [facility])  PLAN:  Pre-Diabetes Roberta [name] will continue to work on weight loss, exercise, and decreasing simple carbohydrates in her diet to help decrease the risk of diabetes. We dicussed metformin including benefits and risks. She was informed that eating too many simple carbohydrates or too many calories at one sitting increases the likelihood of GI side effects. Roberta [name] agrees to continue metformin 500 mg qAM #30 with no refills. We will repeat labs in early November. Roberta [name] will continue with the Category 2 plan and she agreed to follow up with [place] as directed to monitor her progress.  Diabetes risk counseling Roberta [name] was given extended (15 minutes) diabetes prevention counseling today. She is 53 y.o. female and has risk factors for diabetes including obesity and prediabetes. We discussed intensive lifestyle modifications today with an emphasis on weight loss as well as increasing exercise and decreasing simple carbohydrates in her diet.  Hypertension We discussed sodium restriction, working on healthy weight loss, and a regular exercise program as the means to achieve improved blood pressure control. Roberta [name] agreed with this plan and agreed to follow up as directed. We will continue to monitor her blood pressure as well as her progress with the above lifestyle modifications. She will continue her medications as prescribed and will watch for signs of hypotension as she continues her lifestyle modifications.  Obesity Roberta [name] is currently in the action stage of change. As such, her goal is to continue with weight loss efforts She has agreed to follow the Category 2  plan +100 calories Roberta [name] has been instructed to work up to a goal of 150 minutes of combined cardio and strengthening exercise per week for weight loss and overall health benefits. We discussed the following Behavioral Modification Strategies today: planning for success, keeping healthy foods in the home, increasing lean protein intake,  increasing vegetables and work on meal planning and easy cooking plans  Roberta [name] has agreed to follow up with our clinic in 2 weeks. She was informed of the importance of frequent follow up visits to maximize her success with intensive lifestyle modifications for her multiple health conditions.  ALLERGIES: Allergies  Allergen Reactions  . Lisinopril Swelling    Swelling of the tongue  . Penicillins     Pt told as a child-unknown reaction.  . Tetanus Toxoids Swelling    Swelling at site of injection  . Norvasc [Amlodipine Besylate] Rash    MEDICATIONS: Current Outpatient Medications on File Prior to Visit  Medication Sig Dispense Refill  . Biotin 10000 MCG TABS Take 10,000 mcg by mouth.    . Black Cohosh 40 MG CAPS Take 1 capsule by mouth daily.    . Cholecalciferol (VITAMIN D-3) 5000 units TABS Take 1 tablet by mouth daily. 30 tablet   . losartan-hydrochlorothiazide (HYZAAR) 100-25 MG tablet Take 1 tablet by mouth daily. -- Office visit needed for further refills 90 tablet 0  . Multiple Vitamin (MULTIVITAMIN) tablet Take 1 tablet by mouth daily.    . norethindrone (MICRONOR,CAMILA,ERRIN) 0.35 MG tablet Take 1 tablet by mouth daily.    . Omega-3 Fatty Acids (OMEGA 3 500 PO) Take 500 mg by mouth.    . vitamin E 100 UNIT capsule Take 180 Units by mouth daily.    Marland Kitchen zinc gluconate 50 MG tablet Take 50 mg by mouth daily.     No current facility-administered medications on file prior to visit.     PAST MEDICAL HISTORY: Past Medical History:  Diagnosis Date  . Back pain   . Hip pain   . Hypertension   . Obesity   . Prediabetes   . Swelling    bilat LE    PAST SURGICAL HISTORY: Past Surgical History:  Procedure Laterality Date  . CHOLECYSTECTOMY    . GASTRIC BYPASS    . KNEE SURGERY  2002   Ligament repair    SOCIAL HISTORY: Social History   Tobacco Use  . Smoking status: Never Smoker  . Smokeless tobacco: Never Used  Substance Use Topics  . Alcohol use: No  . Drug  use: No    FAMILY HISTORY: Family History  Problem Relation Age of Onset  . Diabetes Mother   . Hypertension Mother   . Heart disease Mother   . Stroke Mother   . Kidney disease Mother   . Obesity Mother   . Cancer Maternal Aunt        lung  . Cancer Maternal Uncle        lung    ROS: Review of Systems  Constitutional: Negative for weight loss.  Cardiovascular: Negative for chest pain.       Negative for chest pressure  Gastrointestinal: Negative for nausea.  Genitourinary: Negative for frequency.  Neurological: Negative for headaches.  Endo/Heme/Allergies: Negative for polydipsia.       Negative for hypoglycemia    PHYSICAL EXAM: Blood pressure 116/73, pulse 76, temperature 98.2 F (36.8 C), temperature source Oral, height 5\' 6"  (1.676 m), weight 265 lb (120.2 kg), last menstrual period 08/07/2014, SpO2 98 %.  Body mass index is 42.77 kg/m. Physical Exam Vitals signs reviewed.  Constitutional:      Appearance: Normal appearance. She is well-developed. She is obese.  Cardiovascular:     Rate and Rhythm: Normal rate.  Pulmonary:     Effort: Pulmonary effort is normal.  Musculoskeletal: Normal range of motion.  Skin:    General: Skin is warm and dry.  Neurological:     Mental Status: She is alert and oriented to person, place, and time.  Psychiatric:        Mood and Affect: Mood normal.        Behavior: Behavior normal.     RECENT LABS AND TESTS: BMET    Component Value Date/Time   NA 139 07/17/2018 1547   K 4.0 07/17/2018 1547   CL 101 07/17/2018 1547   CO2 25 07/17/2018 1547   GLUCOSE 95 07/17/2018 1547   GLUCOSE 92 08/28/2017 1623   BUN 16 07/17/2018 1547   CREATININE 0.98 07/17/2018 1547   CALCIUM 9.1 07/17/2018 1547   GFRNONAA 65 07/17/2018 1547   GFRAA 75 07/17/2018 1547   Lab Results  Component Value Date   HGBA1C 5.9 (H) 07/17/2018   HGBA1C 6.1 (H) 01/31/2018   HGBA1C 6.0 (H) 10/08/2017   HGBA1C 6.3 06/25/2017   HGBA1C 6.3 01/22/2017    Lab Results  Component Value Date   INSULIN 13.4 01/31/2018   INSULIN 10.6 10/08/2017   CBC    Component Value Date/Time   WBC 4.4 07/17/2018 1547   WBC 6.0 06/25/2017 1541   RBC 4.66 07/17/2018 1547   RBC 4.70 06/25/2017 1541   HGB 12.3 07/17/2018 1547   HCT 38.5 07/17/2018 1547   PLT 262.0 06/25/2017 1541   MCV 83 07/17/2018 1547   MCH 26.4 (L) 07/17/2018 1547   MCHC 31.9 07/17/2018 1547   MCHC 32.5 06/25/2017 1541   RDW 14.9 07/17/2018 1547   LYMPHSABS 1.4 07/17/2018 1547   MONOABS 0.5 06/25/2017 1541   EOSABS 0.1 07/17/2018 1547   BASOSABS 0.0 07/17/2018 1547   Iron/TIBC/Ferritin/ %Sat    Component Value Date/Time   IRON 162 (H) 01/22/2017 1152   FERRITIN 14.5 01/22/2017 1152   Lipid Panel     Component Value Date/Time   CHOL 112 10/08/2017 1239   TRIG 69 10/08/2017 1239   HDL 50 10/08/2017 1239   CHOLHDL 2 06/25/2017 1541   VLDL 15.0 06/25/2017 1541   LDLCALC 48 10/08/2017 1239   Hepatic Function Panel     Component Value Date/Time   PROT 6.9 07/17/2018 1547   ALBUMIN 3.6 (L) 07/17/2018 1547   AST 20 07/17/2018 1547   ALT 14 07/17/2018 1547   ALKPHOS 84 07/17/2018 1547   BILITOT 0.3 07/17/2018 1547      Component Value Date/Time   TSH 1.480 10/08/2017 1239   TSH 1.24 06/25/2017 1541   TSH 1.53 07/11/2016 1150     Ref. Range 07/17/2018 15:47  Vitamin D, 25-Hydroxy Latest Ref Range: 30.0 - 100.0 ng/mL 70.2    OBESITY BEHAVIORAL INTERVENTION VISIT  Today's visit was # 19   Starting weight: 292 lbs Starting date: 10/08/2017 Today's weight : 265 lbs Today's date: 07/29/2018 Total lbs lost to date: 27    07/29/2018  Height 5\' 6"  (1.676 m)  Weight 265 lb (120.2 kg)  BMI (Calculated) 42.79  BLOOD PRESSURE - SYSTOLIC 283  BLOOD PRESSURE - DIASTOLIC 73   Body Fat % 66.2 %  Total Body Water (lbs) 96.8 lbs  ASK: We discussed the diagnosis of obesity with Roberta [name] today and Traniyah agreed to give [place] permission to discuss obesity behavioral  modification therapy today.  ASSESS: Roberta [name] has the diagnosis of obesity and her BMI today is 42.79 Roberta [name] is in the action stage of change   ADVISE: Amrita was educated on the multiple health risks of obesity as well as the benefit of weight loss to improve her health. She was advised of the need for long term treatment and the importance of lifestyle modifications to improve her current health and to decrease her risk of future health problems.  AGREE: Multiple dietary modification options and treatment options were discussed and  Roberta [name] agreed to follow the recommendations documented in the above note.  ARRANGE: Roberta [name] was educated on the importance of frequent visits to treat obesity as outlined per CMS and USPSTF guidelines and agreed to schedule her next follow up appointment today.  I, Doreene Nest, am acting as transcriptionist for Eber Jones, MD  I have reviewed the above documentation for accuracy and completeness, and I agree with the above. - Ilene Qua, MD

## 2018-08-14 ENCOUNTER — Ambulatory Visit (INDEPENDENT_AMBULATORY_CARE_PROVIDER_SITE_OTHER): Payer: BC Managed Care – PPO | Admitting: Family Medicine

## 2018-08-14 ENCOUNTER — Other Ambulatory Visit: Payer: Self-pay

## 2018-08-14 ENCOUNTER — Encounter (INDEPENDENT_AMBULATORY_CARE_PROVIDER_SITE_OTHER): Payer: Self-pay | Admitting: Family Medicine

## 2018-08-14 VITALS — BP 109/73 | HR 75 | Temp 98.1°F | Ht 66.0 in | Wt 264.0 lb

## 2018-08-14 DIAGNOSIS — Z9189 Other specified personal risk factors, not elsewhere classified: Secondary | ICD-10-CM | POA: Diagnosis not present

## 2018-08-14 DIAGNOSIS — I10 Essential (primary) hypertension: Secondary | ICD-10-CM

## 2018-08-14 DIAGNOSIS — Z6841 Body Mass Index (BMI) 40.0 and over, adult: Secondary | ICD-10-CM

## 2018-08-14 DIAGNOSIS — R7303 Prediabetes: Secondary | ICD-10-CM | POA: Diagnosis not present

## 2018-08-14 MED ORDER — LOSARTAN POTASSIUM-HCTZ 100-25 MG PO TABS: 0.5000 | ORAL_TABLET | Freq: Every day | ORAL | 0 refills | Status: DC

## 2018-08-15 ENCOUNTER — Other Ambulatory Visit: Payer: Self-pay | Admitting: Obstetrics and Gynecology

## 2018-08-15 DIAGNOSIS — N6001 Solitary cyst of right breast: Secondary | ICD-10-CM

## 2018-08-15 DIAGNOSIS — N6002 Solitary cyst of left breast: Secondary | ICD-10-CM

## 2018-08-16 LAB — HM COLONOSCOPY

## 2018-08-19 NOTE — Progress Notes (Signed)
Office: [phone]  /  Fax: [phone]   HPI:   Chief Complaint: OBESITY Roberta [name] is here to discuss her progress with her obesity treatment plan. She is on the Category 2 plan + 100 calories and is following her eating plan approximately 90 % of the time. She states she is exercising 0 minutes 0 times per week. Roberta [name] had an OBGYN appointment today and was told she had cysts in her breasts, which has led her to have increased anxiety. 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 She had blood work drawn for insurance.  Her weight is 264 lb (119.7 kg) today and has had a weight loss of 1 pounds over a period of 2 weeks since her last visit. She has lost 28 lbs since starting treatment with [place].  Hypertension Roberta [name] is a 53 y.o. female with hypertension. Roberta [name]'s blood pressure is controlled. She denies chest pain, chest pressure, or headaches. She is working on weight loss to help control her blood pressure with the goal of decreasing her risk of heart attack and stroke.   At risk for cardiovascular disease Roberta [name] is at a higher than average risk for cardiovascular disease due to obesity and hypertension. She currently denies any chest pain.  Pre-Diabetes Roberta [name] has a diagnosis of pre-diabetes based on her elevated Hgb A1c and was informed this puts her at greater risk of developing diabetes. She denies GI side effects of metformin, and she is still having some carbohydrate cravings. She continues to work on diet and exercise to decrease risk of diabetes. She denies hypoglycemia.  ASSESSMENT AND PLAN:  Essential hypertension  Prediabetes  At risk for heart disease  Class 3 severe obesity with serious comorbidity and body mass index (BMI) of 40.0 to 44.9 in adult, unspecified obesity type ([name])  PLAN:  Hypertension We discussed sodium restriction, working on healthy weight loss, and a regular exercise program as the means to achieve improved blood  pressure control. Roberta [name] agreed with this plan and agreed to follow up as directed. We will continue to monitor her blood pressure as well as her progress with the above lifestyle modifications. Roberta [name] agrees to cut Hyzaar to 1/2 tablet daily, with no refills. She will watch for signs of hypotension as she continues her lifestyle modifications. Roberta [name] agrees to follow up with our clinic in 2 weeks.  Cardiovascular risk counseling Roberta [name] was given extended (15 minutes) coronary artery disease prevention counseling today. She is 53 y.o. female and has risk factors for heart disease including obesity and hypertension. We discussed intensive lifestyle modifications today with an emphasis on specific weight loss instructions and strategies. Pt was also informed of the importance of increasing exercise and decreasing saturated fats to help prevent heart disease.  Pre-Diabetes Roberta [name] will continue to work on weight loss, exercise, and decreasing simple carbohydrates in her diet to help decrease the risk of diabetes. We dicussed metformin including benefits and risks. She was informed that eating too many simple carbohydrates or too many calories at one sitting increases the likelihood of GI side effects. Roberta [name] agrees to continue taking metformin, and she agrees to follow up with our clinic in 2 weeks as directed to monitor her progress.  Obesity Roberta [name] is currently in the action stage of change. As such, her goal is to continue with weight loss efforts She has agreed to follow the Category 2 plan + 100 calories Roberta [name] is to start school routine and walk after she is  back in school routine. Roberta [name] has been instructed to work up to a goal of 150 minutes of combined cardio and strengthening exercise per week for weight loss and overall health benefits. We discussed the following Behavioral Modification Strategies today: increasing lean protein intake, increasing vegetables and work on meal planning and easy cooking  plans, keeping healthy foods in the home, and planning for success   Roberta [name] has agreed to follow up with our clinic in 2 weeks. She was informed of the importance of frequent follow up visits to maximize her success with intensive lifestyle modifications for her multiple health conditions.  ALLERGIES: Allergies  Allergen Reactions  . Lisinopril Swelling    Swelling of the tongue  . Penicillins     Pt told as a child-unknown reaction.  . Tetanus Toxoids Swelling    Swelling at site of injection  . Norvasc [Amlodipine Besylate] Rash    MEDICATIONS: Current Outpatient Medications on File Prior to Visit  Medication Sig Dispense Refill  . Biotin 10000 MCG TABS Take 10,000 mcg by mouth.    . Black Cohosh 40 MG CAPS Take 1 capsule by mouth daily.    . Cholecalciferol (VITAMIN D-3) 5000 units TABS Take 1 tablet by mouth daily. 30 tablet   . metFORMIN (GLUCOPHAGE) 500 MG tablet Take 1 tablet (500 mg total) by mouth daily with breakfast. 30 tablet 0  . Multiple Vitamin (MULTIVITAMIN) tablet Take 1 tablet by mouth daily.    . norethindrone (MICRONOR,CAMILA,ERRIN) 0.35 MG tablet Take 1 tablet by mouth daily.    . Omega-3 Fatty Acids (OMEGA 3 500 PO) Take 500 mg by mouth.    . vitamin E 100 UNIT capsule Take 180 Units by mouth daily.    Marland Kitchen zinc gluconate 50 MG tablet Take 50 mg by mouth daily.     No current facility-administered medications on file prior to visit.     PAST MEDICAL HISTORY: Past Medical History:  Diagnosis Date  . Back pain   . Hip pain   . Hypertension   . Obesity   . Prediabetes   . Swelling    bilat LE    PAST SURGICAL HISTORY: Past Surgical History:  Procedure Laterality Date  . CHOLECYSTECTOMY    . GASTRIC BYPASS    . KNEE SURGERY  2002   Ligament repair    SOCIAL HISTORY: Social History   Tobacco Use  . Smoking status: Never Smoker  . Smokeless tobacco: Never Used  Substance Use Topics  . Alcohol use: No  . Drug use: No    FAMILY HISTORY:  Family History  Problem Relation Age of Onset  . Diabetes Mother   . Hypertension Mother   . Heart disease Mother   . Stroke Mother   . Kidney disease Mother   . Obesity Mother   . Cancer Maternal Aunt        lung  . Cancer Maternal Uncle        lung    ROS: Review of Systems  Constitutional: Positive for weight loss.  Cardiovascular: Negative for chest pain.       Negative chest pressure  Neurological: Negative for headaches.  Endo/Heme/Allergies:       Negative hypoglycemia    PHYSICAL EXAM: Blood pressure 109/73, pulse 75, temperature 98.1 F (36.7 C), temperature source Oral, height 5\' 6"  (1.676 m), weight 264 lb (119.7 kg), last menstrual period 08/07/2014, SpO2 99 %. Body mass index is 42.61 kg/m. Physical Exam Vitals signs reviewed.  Constitutional:  Appearance: Normal appearance. She is obese.  Cardiovascular:     Rate and Rhythm: Normal rate.     Pulses: Normal pulses.  Pulmonary:     Effort: Pulmonary effort is normal.     Breath sounds: Normal breath sounds.  Musculoskeletal: Normal range of motion.  Skin:    General: Skin is warm and dry.  Neurological:     Mental Status: She is alert and oriented to person, place, and time.  Psychiatric:        Mood and Affect: Mood normal.        Behavior: Behavior normal.     RECENT LABS AND TESTS: BMET    Component Value Date/Time   NA 139 07/17/2018 1547   K 4.0 07/17/2018 1547   CL 101 07/17/2018 1547   CO2 25 07/17/2018 1547   GLUCOSE 95 07/17/2018 1547   GLUCOSE 92 08/28/2017 1623   BUN 16 07/17/2018 1547   CREATININE 0.98 07/17/2018 1547   CALCIUM 9.1 07/17/2018 1547   GFRNONAA 65 07/17/2018 1547   GFRAA 75 07/17/2018 1547   Lab Results  Component Value Date   HGBA1C 5.9 (H) 07/17/2018   HGBA1C 6.1 (H) 01/31/2018   HGBA1C 6.0 (H) 10/08/2017   HGBA1C 6.3 06/25/2017   HGBA1C 6.3 01/22/2017   Lab Results  Component Value Date   INSULIN 13.4 01/31/2018   INSULIN 10.6 10/08/2017   CBC     Component Value Date/Time   WBC 4.4 07/17/2018 1547   WBC 6.0 06/25/2017 1541   RBC 4.66 07/17/2018 1547   RBC 4.70 06/25/2017 1541   HGB 12.3 07/17/2018 1547   HCT 38.5 07/17/2018 1547   PLT 262.0 06/25/2017 1541   MCV 83 07/17/2018 1547   MCH 26.4 (L) 07/17/2018 1547   MCHC 31.9 07/17/2018 1547   MCHC 32.5 06/25/2017 1541   RDW 14.9 07/17/2018 1547   LYMPHSABS 1.4 07/17/2018 1547   MONOABS 0.5 06/25/2017 1541   EOSABS 0.1 07/17/2018 1547   BASOSABS 0.0 07/17/2018 1547   Iron/TIBC/Ferritin/ %Sat    Component Value Date/Time   IRON 162 (H) 01/22/2017 1152   FERRITIN 14.5 01/22/2017 1152   Lipid Panel     Component Value Date/Time   CHOL 112 10/08/2017 1239   TRIG 69 10/08/2017 1239   HDL 50 10/08/2017 1239   CHOLHDL 2 06/25/2017 1541   VLDL 15.0 06/25/2017 1541   LDLCALC 48 10/08/2017 1239   Hepatic Function Panel     Component Value Date/Time   PROT 6.9 07/17/2018 1547   ALBUMIN 3.6 (L) 07/17/2018 1547   AST 20 07/17/2018 1547   ALT 14 07/17/2018 1547   ALKPHOS 84 07/17/2018 1547   BILITOT 0.3 07/17/2018 1547      Component Value Date/Time   TSH 1.480 10/08/2017 1239   TSH 1.24 06/25/2017 1541   TSH 1.53 07/11/2016 1150      OBESITY BEHAVIORAL INTERVENTION VISIT  Today's visit was # 21   Starting weight: 292 lbs Starting date: 10/08/17 Today's weight : 264 lbs Today's date: 08/14/2018 Total lbs lost to date: 28    ASK: We discussed the diagnosis of obesity with Jonnie Kind today and [name] agreed to give [place] permission to discuss obesity behavioral modification therapy today.  ASSESS: Amita has the diagnosis of obesity and her BMI today is 42.63 Maicee is in the action stage of change   ADVISE: Mandi was educated on the multiple health risks of obesity as well as the benefit of weight loss  to improve her health. She was advised of the need for long term treatment and the importance of lifestyle modifications to improve her current health  and to decrease her risk of future health problems.  AGREE: Multiple dietary modification options and treatment options were discussed and  Tamy agreed to follow the recommendations documented in the above note.  ARRANGE: Purity was educated on the importance of frequent visits to treat obesity as outlined per CMS and USPSTF guidelines and agreed to schedule her next follow up appointment today.  I, Trixie Dredge, am acting as transcriptionist for Ilene Qua, MD  I have reviewed the above documentation for accuracy and completeness, and I agree with the above. - Ilene Qua, MD

## 2018-08-20 ENCOUNTER — Encounter: Payer: Self-pay | Admitting: Internal Medicine

## 2018-08-20 ENCOUNTER — Other Ambulatory Visit: Payer: BC Managed Care – PPO

## 2018-08-20 NOTE — Progress Notes (Signed)
Abstracted and sent to scan  

## 2018-08-23 ENCOUNTER — Other Ambulatory Visit: Payer: Self-pay

## 2018-08-23 ENCOUNTER — Other Ambulatory Visit: Payer: Self-pay | Admitting: Obstetrics and Gynecology

## 2018-08-23 ENCOUNTER — Ambulatory Visit: Payer: BC Managed Care – PPO

## 2018-08-23 ENCOUNTER — Ambulatory Visit
Admission: RE | Admit: 2018-08-23 | Discharge: 2018-08-23 | Disposition: A | Discharge status: Home / Self Care | Payer: BC Managed Care – PPO | Source: Ambulatory Visit | Attending: Obstetrics and Gynecology | Admitting: Obstetrics and Gynecology

## 2018-08-23 DIAGNOSIS — N6001 Solitary cyst of right breast: Secondary | ICD-10-CM

## 2018-08-23 DIAGNOSIS — N6320 Unspecified lump in the left breast, unspecified quadrant: Secondary | ICD-10-CM

## 2018-08-23 DIAGNOSIS — N6002 Solitary cyst of left breast: Secondary | ICD-10-CM

## 2018-08-29 ENCOUNTER — Ambulatory Visit
Admission: RE | Admit: 2018-08-29 | Discharge: 2018-08-29 | Disposition: A | Discharge status: Home / Self Care | Payer: BC Managed Care – PPO | Source: Ambulatory Visit | Attending: Obstetrics and Gynecology | Admitting: Obstetrics and Gynecology

## 2018-08-29 ENCOUNTER — Other Ambulatory Visit: Payer: Self-pay

## 2018-08-29 DIAGNOSIS — N6320 Unspecified lump in the left breast, unspecified quadrant: Secondary | ICD-10-CM

## 2018-08-30 ENCOUNTER — Other Ambulatory Visit: Payer: Self-pay | Admitting: Obstetrics and Gynecology

## 2018-08-30 ENCOUNTER — Encounter: Payer: Self-pay | Admitting: *Deleted

## 2018-08-30 ENCOUNTER — Telehealth: Payer: Self-pay | Admitting: Oncology

## 2018-08-30 DIAGNOSIS — C50912 Malignant neoplasm of unspecified site of left female breast: Secondary | ICD-10-CM

## 2018-08-30 NOTE — Telephone Encounter (Signed)
Spoke with patient to confirm morning [facility] appointment for 9/2, packet will be sent via email

## 2018-09-02 ENCOUNTER — Other Ambulatory Visit: Payer: Self-pay | Admitting: *Deleted

## 2018-09-02 ENCOUNTER — Other Ambulatory Visit: Payer: Self-pay

## 2018-09-02 ENCOUNTER — Ambulatory Visit
Admission: RE | Admit: 2018-09-02 | Discharge: 2018-09-02 | Disposition: A | Discharge status: Home / Self Care | Payer: BC Managed Care – PPO | Source: Ambulatory Visit | Attending: Obstetrics and Gynecology | Admitting: Obstetrics and Gynecology

## 2018-09-02 ENCOUNTER — Encounter (INDEPENDENT_AMBULATORY_CARE_PROVIDER_SITE_OTHER): Payer: Self-pay | Admitting: Family Medicine

## 2018-09-02 DIAGNOSIS — Z170 Estrogen receptor positive status [ER+]: Secondary | ICD-10-CM

## 2018-09-02 DIAGNOSIS — C50212 Malignant neoplasm of upper-inner quadrant of left female breast: Secondary | ICD-10-CM | POA: Insufficient documentation

## 2018-09-02 DIAGNOSIS — C50912 Malignant neoplasm of unspecified site of left female breast: Secondary | ICD-10-CM

## 2018-09-02 NOTE — Telephone Encounter (Signed)
Please review

## 2018-09-03 NOTE — Progress Notes (Signed)
[place]  Telephone:([phone]) [phone] Fax:(336) [phone]    ID: Roberta [name] DOB: 06-03-1965  MR#: [id number]  PPH#:[id number]  Patient Care Team: [name], MD as PCP - General (Internal Medicine) Himmelrich, Bryson Ha, RD (Inactive) as Dietitian (Bariatrics) Mauro Kaufmann, RN as Oncology Nurse Navigator Rockwell Germany, RN as Oncology Nurse Navigator Fanny Skates, MD as Consulting Physician (General Surgery) Azka Steger, Virgie Dad, MD as Consulting Physician (Oncology) Kyung Rudd, MD as Consulting Physician (Radiation Oncology) Juanita Craver, MD as Consulting Physician (Gastroenterology) Sherlyn Hay, DO as Consulting Physician (Obstetrics and Gynecology) OTHER MD:   CHIEF COMPLAINT: Estrogen receptor positive breast cancer  CURRENT TREATMENT: Definitive surgery pending   HISTORY OF CURRENT ILLNESS: Roberta [name] had routine screening mammography on 07/25/2018 which was normal.  Less than a month later she was found by Dr. Terri Piedra to have palpable lumps in the superior left breast at a clinic exam.  Roberta [name] underwent unilateral left diagnostic mammography with tomography and left breast ultrasonography at [facility] on 08/23/2018 showing: Breast Density Category C. No suspicious masses are seen on the tomosynthesis images of the left breast. There are 2 groups of indeterminate calcifications however, one in the superior anterior superficial left breast which spans 8 mm. In the upper inner left breast, there is a 2.2 cm area of loosely grouped amorphous calcifications. Normal fibroglandular tissue in the upper inner and upper outer quadrants of the left breast.  There were no sonographic abnormalities to correspond with the masses identified on the clinical breast exam.  Accordingly on 08/29/2018 she proceeded to stereotactic biopsy of the left breast area in question. The pathology from this procedure showed ([id number]): invasive mammary  carcinoma, nottingham grade 2 of 3. Prognostic indicators significant for: estrogen receptor, 100% positive and progesterone receptor, 2% positive, both with strong staining intensity. Proliferation marker Ki67 at 10%. HER2 negative (0+) by immunohistochemistry.  An additional biopsy of the left breast was performed on the same day. The pathology from this procedure showed ([id number]): ductal carcinoma in situ. Prognostic indicators significant for: estrogen receptor, 95% positie and progesterone receptor, 100% positive, both with strong staining intensity.  Finally, she underwent a left axillary ultrasound on 09/02/2018 showing: No left axillary adenopathy.  The patient's subsequent history is as detailed below.   INTERVAL HISTORY: Roberta [name] was evaluated in the multidisciplinary breast cancer clinic on 09/04/2018.   Her case was also presented at the multidisciplinary breast cancer conference on the same day. At that time a preliminary plan was proposed: Mastectomy with sentinel lymph node sampling, Oncotype to be obtained from the original biopsy, adjuvant radiation, antiestrogens  REVIEW OF SYSTEMS: There were no specific symptoms leading to the original mammogram, which was routinely scheduled. The patient denies unusual headaches, visual changes, nausea, vomiting, stiff neck, dizziness, or gait imbalance. There has been no cough, phlegm production, or pleurisy, no chest pain or pressure, and no change in bowel or bladder habits. The patient denies fever, rash, bleeding, unexplained fatigue or unexplained weight loss. A detailed review of systems was otherwise entirely negative.   PAST MEDICAL HISTORY: Past Medical History:  Diagnosis Date   Back pain    Hip pain    Hypertension    Obesity    Prediabetes    Swelling    bilat LE     PAST SURGICAL HISTORY: Past Surgical History:  Procedure Laterality Date   CHOLECYSTECTOMY     GASTRIC BYPASS     KNEE SURGERY  2002  Ligament repair     FAMILY HISTORY: Family History  Problem Relation Age of Onset   Diabetes Mother    Hypertension Mother    Heart disease Mother    Stroke Mother    Kidney disease Mother    Obesity Mother    Cancer Maternal Aunt        lung   Cancer Maternal Uncle        lung   Throat cancer Maternal Aunt    Roberta [name]'s father died in his mid 82s from alcoholism.  The patient's mother died at age 35 from end-stage renal disease.  The patient had 2 brothers, 3 sisters, one brother dying from complications of diabetes.  There is no cancer in the immediate family.  There are 2 maternal uncles and 2 maternal aunts who died from lung cancer, all of whom smoked.  A maternal aunt also had a history of throat cancer.  GYNECOLOGIC HISTORY:  Patient's last menstrual period was 08/07/2014. 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  She is divorced, lives alone.   ADVANCED DIRECTIVES: She intends to name her sister [name] as healthcare power of attorney.  [name] can be reached at [phone].  HEALTH MAINTENANCE: Social History   Tobacco Use   Smoking status: Never Smoker   Smokeless tobacco: Never Used  Substance Use Topics   Alcohol use: Yes   Drug use: No    Colonoscopy: August 2020  PAP: Up-to-date  Bone density: 2005   Allergies  Allergen Reactions   Lisinopril Swelling    Swelling of the tongue   Penicillins     Pt told as a child-unknown reaction.   Tetanus Toxoids Swelling    Swelling at site of injection   Norvasc [Amlodipine Besylate] Rash    Current Outpatient Medications  Medication Sig Dispense Refill   Biotin 10000 MCG TABS Take 10,000 mcg by mouth.     Black Cohosh  40 MG CAPS Take 1 capsule by mouth daily.     Cholecalciferol (VITAMIN D-3) 5000 units TABS Take 1 tablet by mouth daily. 30 tablet    losartan-hydrochlorothiazide (HYZAAR) 100-25 MG tablet Take 0.5 tablets by mouth daily. -- Office visit needed for further refills 1 tablet 0   metFORMIN (GLUCOPHAGE) 500 MG tablet Take 1 tablet (500 mg total) by mouth daily with breakfast. 30 tablet 0   Multiple Vitamin (MULTIVITAMIN) tablet Take 1 tablet by mouth daily.     norethindrone (MICRONOR,CAMILA,ERRIN) 0.35 MG tablet Take 1 tablet by mouth daily.     Omega-3 Fatty Acids (OMEGA 3 500 PO) Take 500 mg by mouth.     vitamin E 100 UNIT capsule Take 180 Units by mouth daily.     zinc gluconate 50 MG tablet Take 50 mg by mouth daily.     No current facility-administered medications for this visit.      OBJECTIVE: Morbidly obese African-American woman in no acute distress  Vitals:   09/04/18 0900  BP: (!) 129/93  Pulse: 65  Resp: 18  Temp: 98.5 F (36.9 C)  SpO2: 100%   Wt Readings from Last 3 Encounters:  09/04/18 268 lb 3.2 oz (121.7 kg)  08/14/18 264 lb (119.7 kg)  07/29/18 265 lb (120.2 kg)  Body mass index is 43.29 kg/m.    ECOG FS:1 - Symptomatic but completely ambulatory  Ocular: Sclerae unicteric, pupils round and equal Ear-nose-throat: Oropharynx clear and moist Lymphatic: No cervical or supraclavicular adenopathy Lungs no rales or rhonchi Heart regular rate and rhythm Abd soft, nontender, positive bowel sounds MSK no focal spinal tenderness, no joint edema Neuro: non-focal, well-oriented, appropriate affect Breasts: The right breast is unremarkable.  The left breast is status post recent biopsy.  I do not palpate a definite mass.  Both axillae are benign.   LAB RESULTS:  CMP     Component Value Date/Time   NA 139 09/04/2018 0840   NA 139 07/17/2018 1547   K 3.5 09/04/2018 0840   CL 105 09/04/2018 0840   CO2 26 09/04/2018 0840   GLUCOSE 120 (H) 09/04/2018  0840   BUN 18 09/04/2018 0840   BUN 16 07/17/2018 1547   CREATININE 0.95 09/04/2018 0840   CALCIUM 8.6 (L) 09/04/2018 0840   PROT 6.9 09/04/2018 0840   PROT 6.9 07/17/2018 1547   ALBUMIN 3.0 (L) 09/04/2018 0840   ALBUMIN 3.6 (L) 07/17/2018 1547   AST 13 (L) 09/04/2018 0840   ALT 10 09/04/2018 0840   ALKPHOS 65 09/04/2018 0840   BILITOT 0.4 09/04/2018 0840   GFRNONAA >60 09/04/2018 0840   GFRAA >60 09/04/2018 0840    No results found for: TOTALPROTELP, ALBUMINELP, A1GS, A2GS, BETS, BETA2SER, GAMS, MSPIKE, SPEI  No results found for: KPAFRELGTCHN, LAMBDASER, KAPLAMBRATIO  Lab Results  Component Value Date   WBC 5.4 09/04/2018   NEUTROABS 3.4 09/04/2018   HGB 11.8 (L) 09/04/2018   HCT 37.0 09/04/2018   MCV 83.1 09/04/2018   PLT 260 09/04/2018    No results found for: LABCA2  No components found for: [id number]  No results for input(s): INR in the last 168 hours.  No results found for: LABCA2  No results found for: BWI203  No results found for: TDH741  No results found for: ULA453  No results found for: CA2729  No components found for: HGQUANT  No results found for: CEA1 / No results found for: CEA1   No results found for: AFPTUMOR  No results found for: CHROMOGRNA  No results found for: PSA1  Appointment on 09/04/2018  Component Date Value Ref Range Status   Sodium 09/04/2018 139  135 - 145 mmol/L Final   Potassium 09/04/2018 3.5  3.5 - 5.1 mmol/L Final   Chloride 09/04/2018 105  98 - 111 mmol/L Final   CO2 09/04/2018 26  22 - 32 mmol/L Final   Glucose, Bld 09/04/2018 120* 70 - 99 mg/dL Final   BUN 09/04/2018 18  6 - 20 mg/dL Final   Creatinine 09/04/2018 0.95  0.44 - 1.00 mg/dL Final   Calcium 09/04/2018 8.6* 8.9 - 10.3 mg/dL Final   Total Protein 09/04/2018 6.9  6.5 - 8.1 g/dL Final   Albumin 09/04/2018 3.0* 3.5 - 5.0 g/dL Final   AST 09/04/2018 13* 15 - 41 U/L Final   ALT 09/04/2018 10  0 - 44 U/L Final   Alkaline Phosphatase  09/04/2018 65  38 - 126 U/L Final   Total Bilirubin 09/04/2018 0.4  0.3 - 1.2 mg/dL Final   GFR, Est Non Af Am 09/04/2018 >60  >60 mL/min Final   GFR, Est AFR Am 09/04/2018 >60  >60 mL/min Final   Anion gap 09/04/2018 8  5 - 15 Final   Performed at [facility], [address]  [place], [place] [address]   WBC Count 09/04/2018 5.4  4.0 - 10.5 K/uL Final   RBC 09/04/2018 4.45  3.87 - 5.11 MIL/uL Final   Hemoglobin 09/04/2018 11.8* 12.0 - 15.0 g/dL Final   HCT 09/04/2018 37.0  36.0 - 46.0 % Final   MCV 09/04/2018 83.1  80.0 - 100.0 fL Final   MCH 09/04/2018 26.5  26.0 - 34.0 pg Final   MCHC 09/04/2018 31.9  30.0 - 36.0 g/dL Final   RDW 09/04/2018 15.2  11.5 - 15.5 % Final   Platelet Count 09/04/2018 260  150 - 400 K/uL Final   nRBC 09/04/2018 0.0  0.0 - 0.2 % Final   Neutrophils Relative % 09/04/2018 63  % Final   Neutro Abs 09/04/2018 3.4  1.7 - 7.7 K/uL Final   Lymphocytes Relative 09/04/2018 25  % Final   Lymphs Abs 09/04/2018 1.4  0.7 - 4.0 K/uL Final   Monocytes Relative 09/04/2018 10  % Final   Monocytes Absolute 09/04/2018 0.6  0.1 - 1.0 K/uL Final   Eosinophils Relative 09/04/2018 1  % Final   Eosinophils Absolute 09/04/2018 0.1  0.0 - 0.5 K/uL Final   Basophils Relative 09/04/2018 1  % Final   Basophils Absolute 09/04/2018 0.0  0.0 - 0.1 K/uL Final   Immature Granulocytes 09/04/2018 0  % Final   Abs Immature Granulocytes 09/04/2018 0.00  0.00 - 0.07 K/uL Final   Performed at [facility], [address] [place], [place] [id number]    (this displays the last labs from the last 3 days)  No results found for: TOTALPROTELP, ALBUMINELP, A1GS, A2GS, BETS, BETA2SER, GAMS, MSPIKE, SPEI (this displays SPEP labs)  No results found for: KPAFRELGTCHN, LAMBDASER, KAPLAMBRATIO (kappa/lambda light chains)  No results found for: HGBA, HGBA2QUANT, HGBFQUANT, HGBSQUAN (Hemoglobinopathy evaluation)   No results  found for: LDH  Lab Results  Component Value Date   IRON 162 (H) 01/22/2017   (Iron and TIBC)  Lab Results  Component Value Date   FERRITIN 14.5 01/22/2017    Urinalysis    Component Value Date/Time   COLORURINE YELLOW 06/25/2007 0640   APPEARANCEUR CLOUDY (A) 06/25/2007 0640   LABSPEC 1.016 06/25/2007 0640   PHURINE 6.0 06/25/2007 0640   GLUCOSEU NEGATIVE 06/25/2007 0640   HGBUR LARGE (A) 06/25/2007 0640   BILIRUBINUR NEGATIVE 06/25/2007 0640   KETONESUR NEGATIVE 06/25/2007 0640   PROTEINUR 100 (A) 06/25/2007 0640   UROBILINOGEN 0.2 06/25/2007 0640   NITRITE POSITIVE (A) 06/25/2007 0640   LEUKOCYTESUR MODERATE (A) 06/25/2007 0640     STUDIES:  US Breast Ltd Uni Left Inc Axilla  Result Date: 08/23/2018 CLINICAL DATA:  53 year old female presenting for evaluation of palpable lumps in the superior left breast identified on clinical breast exam. EXAM: DIGITAL DIAGNOSTIC LEFT MAMMOGRAM WITH CAD AND TOMO ULTRASOUND LEFT BREAST COMPARISON:  Previous exam(s). ACR Breast Density Category c: The breast tissue is heterogeneously dense, which may obscure small masses. FINDINGS: No suspicious masses are seen on the tomosynthesis images of the left breast. There are 2 groups of indeterminate calcifications however, one in the superior anterior superficial left breast which spans 8 mm. In the upper inner left breast, there is a 2.2 cm area of loosely grouped amorphous calcifications. Mammographic images were processed with CAD. Normal fibroglandular tissue in the upper inner and upper outer quadrants of the left breast. IMPRESSION: 1. There are 2 groups of indeterminate calcifications in the left breast. 2. No mammographic or targeted sonographic abnormalities in the  left breast to correspond with the masses identified on clinical breast exam. RECOMMENDATION: 1. Stereotactic biopsy is recommended for the 2 groups of calcifications in the left breast. This has been scheduled for 08/29/2018 at 9:30  a.m. 2. Clinical follow-up recommended for the palpable areas of concern in the superior left breast. Any further workup should be based on clinical grounds. I have discussed the findings and recommendations with the patient. Results were also provided in writing at the conclusion of the visit. If applicable, a reminder letter will be sent to the patient regarding the next appointment. BI-RADS CATEGORY  4: Suspicious. Electronically Signed   By: Ammie Ferrier M.D.   On: 08/23/2018 10:54   Mm Diag Breast Tomo Uni Left  Result Date: 08/23/2018 CLINICAL DATA:  53 year old female presenting for evaluation of palpable lumps in the superior left breast identified on clinical breast exam. EXAM: DIGITAL DIAGNOSTIC LEFT MAMMOGRAM WITH CAD AND TOMO ULTRASOUND LEFT BREAST COMPARISON:  Previous exam(s). ACR Breast Density Category c: The breast tissue is heterogeneously dense, which may obscure small masses. FINDINGS: No suspicious masses are seen on the tomosynthesis images of the left breast. There are 2 groups of indeterminate calcifications however, one in the superior anterior superficial left breast which spans 8 mm. In the upper inner left breast, there is a 2.2 cm area of loosely grouped amorphous calcifications. Mammographic images were processed with CAD. Normal fibroglandular tissue in the upper inner and upper outer quadrants of the left breast. IMPRESSION: 1. There are 2 groups of indeterminate calcifications in the left breast. 2. No mammographic or targeted sonographic abnormalities in the left breast to correspond with the masses identified on clinical breast exam. RECOMMENDATION: 1. Stereotactic biopsy is recommended for the 2 groups of calcifications in the left breast. This has been scheduled for 08/29/2018 at 9:30 a.m. 2. Clinical follow-up recommended for the palpable areas of concern in the superior left breast. Any further workup should be based on clinical grounds. I have discussed the findings  and recommendations with the patient. Results were also provided in writing at the conclusion of the visit. If applicable, a reminder letter will be sent to the patient regarding the next appointment. BI-RADS CATEGORY  4: Suspicious. Electronically Signed   By: Ammie Ferrier M.D.   On: 08/23/2018 10:54   [place] Axilla Left  Result Date: 09/02/2018 CLINICAL DATA:  Patient with recent diagnosis of left breast invasive mammary carcinoma and DCIS. For evaluation of the left axilla. EXAM: ULTRASOUND OF THE LEFT AXILLA COMPARISON:  [facility] FINDINGS: Ultrasound is performed, showing no cortically thickened lymph node within the left axilla. IMPRESSION: No left axillary adenopathy. RECOMMENDATION: Treatment plan for left breast malignancy. I have discussed the findings and recommendations with the patient. Results were also provided in writing at the conclusion of the visit. If applicable, a reminder letter will be sent to the patient regarding the next appointment. BI-RADS CATEGORY  6: Known biopsy-proven malignancy. Electronically Signed   By: Lovey Newcomer M.D.   On: 09/02/2018 08:56   Mm Clip Placement Left  Result Date: 08/29/2018 CLINICAL DATA:  Patient status post stereotactic guided biopsy left breast calcifications, 2 sites. EXAM: DIAGNOSTIC LEFT MAMMOGRAM POST STEREOTACTIC BIOPSY COMPARISON:  Previous exam(s). FINDINGS: Mammographic images were obtained following stereotactic guided biopsy of left breast calcifications. Site 1: Upper inner left breast: Coil shaped clip: In appropriate position. Site 2: Upper-outer left breast: X shaped clip: In appropriate position. IMPRESSION: Appropriate position biopsy marking clips status post stereotactic guided biopsy  left breast calcifications, 2 sites. Final Assessment: Post Procedure Mammograms for Marker Placement Electronically Signed   By: Lovey Newcomer M.D.   On: 08/29/2018 10:51   Mm Lt Breast Bx W Loc Dev 1st Lesion Image Bx Spec Stereo Guide  Addendum Date:  08/30/2018   ADDENDUM REPORT: 08/30/2018 14:03 ADDENDUM: Pathology revealed GRADE II INVASIVE MAMMARY CARCINOMA, MAMMARY CARCINOMA IN SITU, LYMPHOVASCULAR SPACE INVASION PRESENT of the Left breast, upper inner. HIGH GRADE DUCTAL CARCINOMA IN SITU, CALCIFICATIONS of the Left breast, upper outer. This was found to be concordant by Dr. Lovey Newcomer. Pathology results were discussed with the patient by telephone. The patient reported doing well after the biopsies with tenderness at the sites. Post biopsy instructions and care were reviewed and questions were answered. The patient was encouraged to call [facility] for any additional concerns. The patient was referred to [facility] on September 04, 2018. Pathology results reported by Terie Purser, RN on 08/30/2018. Electronically Signed   By: Lovey Newcomer M.D.   On: 08/30/2018 14:03   Result Date: 08/30/2018 CLINICAL DATA:  Patient with indeterminate left breast calcifications, 2 sites. EXAM: LEFT BREAST STEREOTACTIC CORE NEEDLE BIOPSY COMPARISON:  Previous exams. FINDINGS: The patient and I discussed the procedure of stereotactic-guided biopsy including benefits and alternatives. We discussed the high likelihood of a successful procedure. We discussed the risks of the procedure including infection, bleeding, tissue injury, clip migration, and inadequate sampling. Informed written consent was given. The usual time out protocol was performed immediately prior to the procedure. Site 1: Upper inner left breast anterior depth: Coil shaped clip. Using sterile technique and 1% Lidocaine as local anesthetic, under stereotactic guidance, a 9 gauge vacuum assisted device was used to perform core needle biopsy of calcifications within the upper inner left breast using a cranial approach. Specimen radiograph was performed showing calcifications. Specimens with calcifications are  identified for pathology. Lesion quadrant: Upper inner quadrant At the conclusion of the procedure, a coil shaped tissue marker clip was deployed into the biopsy cavity. Follow-up 2-view mammogram was performed and dictated separately. Site 2: Upper-outer left breast anterior depth: X shaped clip. Using sterile technique and 1% Lidocaine as local anesthetic, under stereotactic guidance, a 9 gauge vacuum assisted device was used to perform core needle biopsy of calcifications within the upper-outer left breast using a cranial approach. Specimen radiograph was performed showing calcifications. Specimens with calcifications are identified for pathology. Lesion quadrant: Upper outer quadrant At the conclusion of the procedure, a X shaped tissue marker clip was deployed into the biopsy cavity. Follow-up 2-view mammogram was performed and dictated separately. IMPRESSION: Stereotactic-guided biopsy of left breast calcifications, 2 sites. No apparent complications. Electronically Signed: By: Lovey Newcomer M.D. On: 08/29/2018 10:50   Mm Lt Breast Bx W Loc Dev Ea Ad Lesion Img Bx Spec Stereo Guide  Addendum Date: 08/30/2018   ADDENDUM REPORT: 08/30/2018 14:03 ADDENDUM: Pathology revealed GRADE II INVASIVE MAMMARY CARCINOMA, MAMMARY CARCINOMA IN SITU, LYMPHOVASCULAR SPACE INVASION PRESENT of the Left breast, upper inner. HIGH GRADE DUCTAL CARCINOMA IN SITU, CALCIFICATIONS of the Left breast, upper outer. This was found to be concordant by Dr. Lovey Newcomer. Pathology results were discussed with the patient by telephone. The patient reported doing well after the biopsies with tenderness at the sites. Post biopsy instructions and care were reviewed and questions were answered. The patient was encouraged to call [facility] for  any additional concerns. The patient was referred to [facility] on September 04, 2018. Pathology  results reported by Terie Purser, RN on 08/30/2018. Electronically Signed   By: Lovey Newcomer M.D.   On: 08/30/2018 14:03   Result Date: 08/30/2018 CLINICAL DATA:  Patient with indeterminate left breast calcifications, 2 sites. EXAM: LEFT BREAST STEREOTACTIC CORE NEEDLE BIOPSY COMPARISON:  Previous exams. FINDINGS: The patient and I discussed the procedure of stereotactic-guided biopsy including benefits and alternatives. We discussed the high likelihood of a successful procedure. We discussed the risks of the procedure including infection, bleeding, tissue injury, clip migration, and inadequate sampling. Informed written consent was given. The usual time out protocol was performed immediately prior to the procedure. Site 1: Upper inner left breast anterior depth: Coil shaped clip. Using sterile technique and 1% Lidocaine as local anesthetic, under stereotactic guidance, a 9 gauge vacuum assisted device was used to perform core needle biopsy of calcifications within the upper inner left breast using a cranial approach. Specimen radiograph was performed showing calcifications. Specimens with calcifications are identified for pathology. Lesion quadrant: Upper inner quadrant At the conclusion of the procedure, a coil shaped tissue marker clip was deployed into the biopsy cavity. Follow-up 2-view mammogram was performed and dictated separately. Site 2: Upper-outer left breast anterior depth: X shaped clip. Using sterile technique and 1% Lidocaine as local anesthetic, under stereotactic guidance, a 9 gauge vacuum assisted device was used to perform core needle biopsy of calcifications within the upper-outer left breast using a cranial approach. Specimen radiograph was performed showing calcifications. Specimens with calcifications are identified for pathology. Lesion quadrant: Upper outer quadrant At the conclusion of the procedure, a X shaped tissue marker clip was deployed into the biopsy cavity. Follow-up 2-view  mammogram was performed and dictated separately. IMPRESSION: Stereotactic-guided biopsy of left breast calcifications, 2 sites. No apparent complications. Electronically Signed: By: Lovey Newcomer M.D. On: 08/29/2018 10:50     ELIGIBLE FOR AVAILABLE RESEARCH PROTOCOL: no   ASSESSMENT: 53 y.o. [place], [place] woman status post left breast upper inner quadrant biopsy for a clinical T2 N0, stage IB invasive ductal carcinoma, grade 2, estrogen and progesterone receptor positive, HER-2 not amplified, with an MIB-1-1 of 2%  (a) a second biopsy same day different quadrant showed ductal carcinoma in situ  (1) definitive surgery pending  (2) Oncotype to be obtained from the original biopsy  (3) adjuvant radiation  (4) Antiestrogens to follow at the completion of local treatment   PLAN: I spent approximately 60 minutes face to face with Georgiann with more than 50% of that time spent in counseling and coordination of care. Specifically we reviewed the biology of the patient's diagnosis and the specifics of her situation.  We first reviewed the fact that cancer is not one disease but more than 100 different diseases and that it is important to keep them separate-- otherwise when friends and relatives discuss their own cancer experiences with Caitlyn confusion can result. Similarly we explained that if breast cancer spreads to the bone or liver, the patient would not have bone cancer or liver cancer, but breast cancer in the bone and breast cancer in the liver: one cancer in three places-- not 3 different cancers which otherwise would have to be treated in 3 different ways.  We discussed the difference between local and systemic therapy. In terms of loco-regional treatment, lumpectomy plus radiation is equivalent to mastectomy as far as survival is concerned.  However in her case, with multicentric disease, lumpectomy would result in unacceptable cosmesis.  Accordingly mastectomy is recommended with consideration of  immediate reconstruction.  We then discussed the rationale for systemic therapy. There is some risk that this cancer may have already spread to other parts of her body. Patients frequently ask at this point about bone scans, CAT scans and PET scans to find out if they have occult breast cancer somewhere else. The problem is that in early stage disease we are much more likely to find false positives then true cancers and this would expose the patient to unnecessary procedures as well as unnecessary radiation. Scans cannot answer the question the patient really would like to know, which is whether she has microscopic disease elsewhere in her body. For those reasons we do not recommend them.  Of course we would proceed to aggressive evaluation of any symptoms that might suggest metastatic disease, but that is not the case here.  Next we went over the options for systemic therapy which are anti-estrogens, anti-HER-2 immunotherapy, and chemotherapy. Kajuana does not meet criteria for anti-HER-2 immunotherapy. She is a good candidate for anti-estrogens.  The question of chemotherapy is more complicated. Chemotherapy is most effective in rapidly growing, aggressive tumors. It is much less effective in slow growing cancers, like [place] 's. For that reason we are going to request an Oncotype from the definitive surgical sample, as suggested by NCCN guidelines. That will help [place] make a definitive decision regarding chemotherapy in this case.  Anjolie has a good understanding of the overall plan. She agrees with it. She knows the goal of treatment in her case is cure. She will call with any problems that may develop before her next visit here.    Doree Kuehne, Virgie Dad, MD  09/04/18 5:53 PM Medical Oncology and Hematology [facility] [address] [place], [place] [address] Tel. [phone]    Fax. [phone]   I, Jacqualyn Posey am acting as a Education administrator for Chauncey Cruel, MD.   I, Lurline Del MD, have reviewed the above documentation for accuracy and completeness, and I agree with the above.

## 2018-09-04 ENCOUNTER — Encounter: Payer: Self-pay | Admitting: Physical Therapy

## 2018-09-04 ENCOUNTER — Other Ambulatory Visit: Payer: Self-pay | Admitting: General Surgery

## 2018-09-04 ENCOUNTER — Telehealth: Payer: Self-pay | Admitting: *Deleted

## 2018-09-04 ENCOUNTER — Ambulatory Visit: Payer: BC Managed Care – PPO | Attending: General Surgery | Admitting: Physical Therapy

## 2018-09-04 ENCOUNTER — Other Ambulatory Visit: Payer: Self-pay

## 2018-09-04 ENCOUNTER — Ambulatory Visit
Admission: RE | Admit: 2018-09-04 | Discharge: 2018-09-04 | Disposition: A | Discharge status: Home / Self Care | Payer: BC Managed Care – PPO | Source: Ambulatory Visit | Attending: Radiation Oncology | Admitting: Radiation Oncology

## 2018-09-04 ENCOUNTER — Encounter (INDEPENDENT_AMBULATORY_CARE_PROVIDER_SITE_OTHER): Payer: Self-pay | Admitting: Family Medicine

## 2018-09-04 ENCOUNTER — Inpatient Hospital Stay: Payer: BC Managed Care – PPO

## 2018-09-04 ENCOUNTER — Other Ambulatory Visit: Payer: Self-pay | Admitting: *Deleted

## 2018-09-04 ENCOUNTER — Inpatient Hospital Stay: Payer: BC Managed Care – PPO | Attending: Oncology | Admitting: Oncology

## 2018-09-04 ENCOUNTER — Encounter: Payer: Self-pay | Admitting: Oncology

## 2018-09-04 ENCOUNTER — Telehealth: Payer: Self-pay | Admitting: Radiation Oncology

## 2018-09-04 ENCOUNTER — Ambulatory Visit (INDEPENDENT_AMBULATORY_CARE_PROVIDER_SITE_OTHER): Payer: BC Managed Care – PPO | Admitting: Family Medicine

## 2018-09-04 VITALS — BP 129/93 | HR 65 | Temp 98.5°F | Resp 18 | Ht 66.0 in | Wt 268.2 lb

## 2018-09-04 DIAGNOSIS — Z170 Estrogen receptor positive status [ER+]: Secondary | ICD-10-CM

## 2018-09-04 DIAGNOSIS — Z79899 Other long term (current) drug therapy: Secondary | ICD-10-CM | POA: Diagnosis not present

## 2018-09-04 DIAGNOSIS — I10 Essential (primary) hypertension: Secondary | ICD-10-CM | POA: Diagnosis not present

## 2018-09-04 DIAGNOSIS — Z9884 Bariatric surgery status: Secondary | ICD-10-CM | POA: Diagnosis not present

## 2018-09-04 DIAGNOSIS — M25612 Stiffness of left shoulder, not elsewhere classified: Secondary | ICD-10-CM

## 2018-09-04 DIAGNOSIS — E669 Obesity, unspecified: Secondary | ICD-10-CM | POA: Insufficient documentation

## 2018-09-04 DIAGNOSIS — R293 Abnormal posture: Secondary | ICD-10-CM

## 2018-09-04 DIAGNOSIS — Z801 Family history of malignant neoplasm of trachea, bronchus and lung: Secondary | ICD-10-CM

## 2018-09-04 DIAGNOSIS — Z7984 Long term (current) use of oral hypoglycemic drugs: Secondary | ICD-10-CM | POA: Insufficient documentation

## 2018-09-04 DIAGNOSIS — C50212 Malignant neoplasm of upper-inner quadrant of left female breast: Secondary | ICD-10-CM | POA: Insufficient documentation

## 2018-09-04 DIAGNOSIS — Z6841 Body Mass Index (BMI) 40.0 and over, adult: Secondary | ICD-10-CM

## 2018-09-04 LAB — CBC WITH DIFFERENTIAL (CANCER CENTER ONLY)
Abs Immature Granulocytes: 0 10*3/uL (ref 0.00–0.07)
Basophils Absolute: 0 10*3/uL (ref 0.0–0.1)
Basophils Relative: 1 %
Eosinophils Absolute: 0.1 10*3/uL (ref 0.0–0.5)
Eosinophils Relative: 1 %
HCT: 37 % (ref 36.0–46.0)
Hemoglobin: 11.8 g/dL — ABNORMAL LOW (ref 12.0–15.0)
Immature Granulocytes: 0 %
Lymphocytes Relative: 25 %
Lymphs Abs: 1.4 10*3/uL (ref 0.7–4.0)
MCH: 26.5 pg (ref 26.0–34.0)
MCHC: 31.9 g/dL (ref 30.0–36.0)
MCV: 83.1 fL (ref 80.0–100.0)
Monocytes Absolute: 0.6 10*3/uL (ref 0.1–1.0)
Monocytes Relative: 10 %
Neutro Abs: 3.4 10*3/uL (ref 1.7–7.7)
Neutrophils Relative %: 63 %
Platelet Count: 260 10*3/uL (ref 150–400)
RBC: 4.45 MIL/uL (ref 3.87–5.11)
RDW: 15.2 % (ref 11.5–15.5)
WBC Count: 5.4 10*3/uL (ref 4.0–10.5)
nRBC: 0 % (ref 0.0–0.2)

## 2018-09-04 LAB — CMP (CANCER CENTER ONLY)
ALT: 10 U/L (ref 0–44)
AST: 13 U/L — ABNORMAL LOW (ref 15–41)
Albumin: 3 g/dL — ABNORMAL LOW (ref 3.5–5.0)
Alkaline Phosphatase: 65 U/L (ref 38–126)
Anion gap: 8 (ref 5–15)
BUN: 18 mg/dL (ref 6–20)
CO2: 26 mmol/L (ref 22–32)
Calcium: 8.6 mg/dL — ABNORMAL LOW (ref 8.9–10.3)
Chloride: 105 mmol/L (ref 98–111)
Creatinine: 0.95 mg/dL (ref 0.44–1.00)
GFR, Est AFR Am: >60 mL/min (ref >60)
GFR, Estimated: >60 mL/min (ref >60)
Glucose, Bld: 120 mg/dL — ABNORMAL HIGH (ref 70–99)
Potassium: 3.5 mmol/L (ref 3.5–5.1)
Sodium: 139 mmol/L (ref 135–145)
Total Bilirubin: 0.4 mg/dL (ref 0.3–1.2)
Total Protein: 6.9 g/dL (ref 6.5–8.1)

## 2018-09-04 NOTE — Therapy (Signed)
[place], [place], [address] Phone: [phone]   Fax:  [phone]  Physical Therapy Evaluation  Patient Details  Name: Roberta [name] MRN: [id number] Date of Birth: 06-08-1965 Referring Provider (PT): Dr. Fanny Skates   Encounter Date: 09/04/2018  PT End of Session - 09/04/18 1124    Visit Number  1    Number of Visits  9    Date for PT Re-Evaluation  10/02/18    PT Start Time  0918    PT Stop Time  0950    PT Time Calculation (min)  32 min    Activity Tolerance  Patient tolerated treatment well    Behavior During Therapy  [facility] for tasks assessed/performed       Past Medical History:  Diagnosis Date  . Back pain   . Hip pain   . Hypertension   . Obesity   . Prediabetes   . Swelling    bilat LE    Past Surgical History:  Procedure Laterality Date  . CHOLECYSTECTOMY    . GASTRIC BYPASS    . KNEE SURGERY  2002   Ligament repair    There were no vitals filed for this visit.   Subjective Assessment - 09/04/18 1053    Subjective  Patient reports she is here today to be seen by her medical team for her newly diagnosed left breast cancer. She also has had limited left shoulder ROM for several months that she reports interferes with daily tasks.    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 The invasive mass measures 2.2 cm and is located in the upper inner quadrant. The DCIS measures 8 mm. It is ER/PR positive and HER2 negative with a Ki67 of 10%. She had gastric bypass surgery in 2005, is pre-diabetic, and has hypertension.    Patient Stated Goals  Learn post op shoulder ROM HEP, learn about lymphedema risk reduction, and be able to reach overhead without difficulty    Currently in Pain?  No/denies         [facility] PT Assessment - 09/04/18 0001      Assessment   Medical Diagnosis  Left breast cancer    Referring  Provider (PT)  Dr. Fanny Skates    Onset Date/Surgical Date  08/23/18    Hand Dominance  Right    Prior Therapy  none      Precautions   Precautions  Other (comment)    Precaution Comments  active cancer      Restrictions   Weight Bearing Restrictions  No      Balance Screen   Has the patient fallen in the past 6 months  No    Has the patient had a decrease in activity level because of a fear of falling?   No    Is the patient reluctant to leave their home because of a fear of falling?   No      Home Environment   Living Environment  Private residence    Living Arrangements  Alone    Available Help at Discharge  Family      Prior Function   Level of Independence  Independent    Vocation  Full time employment    Vocation Requirements  Story teacher at [employer]  She walks 30-60 min once a week      Cognition   Overall Cognitive  Status  Within Functional Limits for tasks assessed      Posture/Postural Control   Posture/Postural Control  Postural limitations    Postural Limitations  Rounded Shoulders;Forward head      ROM / Strength   AROM / PROM / Strength  AROM;Strength      AROM   AROM Assessment Site  Shoulder;Cervical    Right/Left Shoulder  Right;Left    Right Shoulder Extension  54 Degrees    Right Shoulder Flexion  134 Degrees    Right Shoulder ABduction  165 Degrees    Right Shoulder Internal Rotation  74 Degrees    Right Shoulder External Rotation  63 Degrees    Left Shoulder Extension  53 Degrees    Left Shoulder Flexion  104 Degrees    Left Shoulder ABduction  105 Degrees    Left Shoulder Internal Rotation  53 Degrees    Left Shoulder External Rotation  58 Degrees    Cervical Flexion  WNL    Cervical Extension  WNL    Cervical - Right Side Bend  WNL    Cervical - Left Side Bend  WNL    Cervical - Right Rotation  WNL    Cervical - Left Rotation  WNL      Strength   Strength Assessment Site  Shoulder    Right/Left Shoulder   Right;Left    Right Shoulder Flexion  5/5    Right Shoulder Extension  5/5    Right Shoulder ABduction  5/5    Right Shoulder Internal Rotation  5/5    Right Shoulder External Rotation  5/5    Left Shoulder Flexion  5/5    Left Shoulder Extension  5/5    Left Shoulder ABduction  4-/5    Left Shoulder Internal Rotation  5/5    Left Shoulder External Rotation  5/5      Palpation   Palpation comment  No c/o tenderness to palpation left shoulder and scapula      Special Tests   Other special tests  Left shoulder drop arm and impingement tests were negative        LYMPHEDEMA/ONCOLOGY QUESTIONNAIRE - 09/04/18 1119      Type   Cancer Type  Left breast cancer      Lymphedema Assessments   Lymphedema Assessments  Upper extremities      Right Upper Extremity Lymphedema   10 cm Proximal to Olecranon Process  43.2 cm    Olecranon Process  26.3 cm    10 cm Proximal to Ulnar Styloid Process  21.6 cm    Just Proximal to Ulnar Styloid Process  15.2 cm    Across Hand at [employer]  18.8 cm    At [place] of 2nd Digit  5.8 cm      Left Upper Extremity Lymphedema   10 cm Proximal to Olecranon Process  45.5 cm    Olecranon Process  27.6 cm    10 cm Proximal to Ulnar Styloid Process  21.2 cm    Just Proximal to Ulnar Styloid Process  15.5 cm    Across Hand at [employer]  18.3 cm    At [place] of 2nd Digit  5.6 cm          Quick Dash - 09/04/18 0001    Open a tight or new jar  Mild difficulty    Do heavy household chores (wash walls, wash floors)  No difficulty    Carry a shopping bag  or briefcase  No difficulty    Wash your back  No difficulty    Use a knife to cut food  No difficulty    Recreational activities in which you take some force or impact through your arm, shoulder, or hand (golf, hammering, tennis)  No difficulty    During the past week, to what extent has your arm, shoulder or hand problem interfered with your normal social activities with family, friends,  neighbors, or groups?  Not at all    During the past week, to what extent has your arm, shoulder or hand problem limited your work or other regular daily activities  Not at all    Arm, shoulder, or hand pain.  None    Tingling (pins and needles) in your arm, shoulder, or hand  None    Difficulty Sleeping  No difficulty    DASH Score  2.27 %        Objective measurements completed on examination: See above findings.      Patient was instructed today in a home exercise program today for post op shoulder range of motion. These included active assist shoulder flexion in sitting, scapular retraction, wall walking with shoulder abduction, and hands behind head external rotation.  She was encouraged to do these twice a day, holding 3 seconds and repeating 5 times when permitted by her physician.        PT Education - 09/04/18 1122    Education Details  Lymphedema risk reduction and post op shoulder ROM HEP    Person(s) Educated  Patient    Methods  Explanation;Demonstration;Handout    Comprehension  Returned demonstration;Verbalized understanding          PT Long Term Goals - 09/04/18 1146      PT LONG TERM GOAL #1   Title  Patient will be independent with a shoulder ROM HEP to improve ROM    Time  4    Period  Weeks    Status  New    Target Date  10/02/18      PT LONG TERM GOAL #2   Title  Patient will increase left shoulder active flexion ROM to >/= 130 degrees for increased ease reaching overhead.    Baseline  104 degrees    Time  4    Period  Weeks    Status  New    Target Date  10/02/18      PT LONG TERM GOAL #3   Title  Patient will increase left active shoulder abduction to >/= 140 degrees for increased ease obtaining radiation positioning.    Baseline  105 degrees    Time  4    Period  Weeks    Status  New    Target Date  10/02/18      PT LONG TERM GOAL #4   Title  Patient will report >/= 25% improvement in her ability to perform normal daily tasks without  feeling some end ROM limitation.    Time  4    Period  Weeks    Status  New      Breast Clinic Goals - 09/04/18 1145      Patient will be able to verbalize understanding of pertinent lymphedema risk reduction practices relevant to her diagnosis specifically related to skin care.   Time  1    Period  Days    Status  Achieved      Patient will be able to return demonstrate and/or verbalize understanding of  the post-op home exercise program related to regaining shoulder range of motion.   Time  1    Period  Days    Status  Achieved      Patient will be able to verbalize understanding of the importance of attending the postoperative After Breast Cancer Class for further lymphedema risk reduction education and therapeutic exercise.   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 The invasive mass measures 2.2 cm and is located in the upper inner quadrant. The DCIS measures 8 mm. It is ER/PR positive and HER2 negative with a Ki67 of 10%. She had gastric bypass surgery in 2005, is pre-diabetic, and has hypertension. Her left shoulder has gradually lost ROM over the past few months for unknown reasons. Her multidisciplinary medical team met prior to her assessments to determine a recommended treatment plan. She is planning to have a left mastectomy and sentinel node biopsy followed by anti-estrogen therapy. She will have an Oncotype test on the core biopsy. She will benefit from PT now to improve shoulder ROM before surgery and will need a PT reassessment post op to detemrine needs.    Personal Factors and Comorbidities  --   Lives alone   Stability/Clinical Decision Making  Stable/Uncomplicated    Clinical Decision Making  Low    Rehab Potential  Excellent    PT Frequency  2x / week    PT Duration  4 weeks   2x/week for 4 weeks  for left shoulder ROM and then 1 f/u post op   PT Treatment/Interventions  ADLs/Self Care Home Management;Therapeutic exercise;Manual techniques;Patient/family education;Moist Heat   Heat/ice for pre-surgery PT as pt is not at risk for lymphedema until post op   PT Next Visit Plan  PROM, shoulder mobs, ROM exercises    PT Home Exercise Plan  Post op shoulder ROM HEP    Consulted and Agree with Plan of Care  Patient       Patient will benefit from skilled therapeutic intervention in order to improve the following deficits and impairments:  Decreased knowledge of precautions, Impaired UE functional use, Pain, Postural dysfunction, Decreased range of motion  Visit Diagnosis: Stiffness of left shoulder, not elsewhere classified - Plan: PT plan of care cert/re-cert  Malignant neoplasm of upper-inner quadrant of left breast in female, estrogen receptor positive ([place]) - Plan: PT plan of care cert/re-cert  Abnormal posture - Plan: PT plan of care cert/re-cert   Patient will follow up at outpatient cancer rehab 3-4 weeks following surgery.  If the patient requires physical therapy at that time, a specific plan will be dictated and sent to the referring physician for approval. The patient was educated today on appropriate basic range of motion exercises to begin post operatively and the importance of attending the After Breast Cancer class following surgery.  Patient was educated today on lymphedema risk reduction practices as it pertains to recommendations that will benefit the patient immediately following surgery.  She verbalized good understanding.      Problem List Patient Active Problem List   Diagnosis Date Noted  . Malignant neoplasm of upper-inner quadrant of left breast in female, estrogen receptor positive ([place]) 09/02/2018  . Vitamin D deficiency 02/04/2018  . Urticaria 01/17/2018  . Muscle spasm  08/28/2017  . Weight gain 06/25/2017  . 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 Left hip  pain 02/10/2017  . Anemia 01/21/2017  . Acute back pain 06/19/2016  . 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 Prediabetes 12/13/2015  . Obesity 12/13/2015  . Sensation of lump in throat 12/13/2015  . Leg edema 12/13/2015  . Dense breasts 12/13/2011  . 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 Hypertension 11/10/2011   Annia Friendly, PT 09/04/18 11:51 AM  [place], [place], [address] Phone: [phone]   Fax:  [phone]  Name: Roberta [name] MRN: [id number] Date of Birth: 07/07/1965

## 2018-09-04 NOTE — Progress Notes (Signed)
Radiation Oncology         [phone] ________________________________  Name: Roberta [name]        MRN: [id number]  Date of Service: 09/04/2018 DOB: 12/12/1965  YH:CWCBJ, Claudina Lick, MD  Fanny Skates, MD     REFERRING PHYSICIAN: Fanny Skates, MD   DIAGNOSIS: The encounter diagnosis was Malignant neoplasm of upper-inner quadrant of left breast in female, estrogen receptor positive ([place]).   HISTORY OF PRESENT ILLNESS: Roberta [name] is a 53 y.o. female seen in the multidisciplinary breast clinic for a new diagnosis of left breast cancer. The patient was noted to have a palpable mass in the left breast and on further diagnostic examination, she had an 8 mm mass and a 2.2 cm grouping of calcifications. 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 There was a high grade DCIS with calcifications in the upper outer quadrant, that was ER/PR positive. She is seen today to discuss treatment recommendations for her cancer.   PREVIOUS RADIATION THERAPY: No   PAST MEDICAL HISTORY:  Past Medical History:  Diagnosis Date   Back pain    Hip pain    Hypertension    Obesity    Prediabetes    Swelling    bilat LE       PAST SURGICAL HISTORY: Past Surgical History:  Procedure Laterality Date   CHOLECYSTECTOMY     GASTRIC BYPASS     KNEE SURGERY  2002   Ligament repair     FAMILY HISTORY:  Family History  Problem Relation Age of Onset   Diabetes Mother    Hypertension Mother    Heart disease Mother    Stroke Mother    Kidney disease Mother    Obesity Mother    Cancer Maternal Aunt        lung   Cancer Maternal Uncle        lung   Throat cancer Maternal Aunt      SOCIAL HISTORY:  reports that she has never smoked. She has never used smokeless tobacco.  She reports current alcohol use. She reports that she does not use drugs. The patient is separated and lives in [place]. She works for [employer]. She is a high school Merchant navy officer.    ALLERGIES: Lisinopril, Penicillins, Tetanus toxoids, and Norvasc [amlodipine besylate]   MEDICATIONS:  Current Outpatient Medications  Medication Sig Dispense Refill   Biotin 10000 MCG TABS Take 10,000 mcg by mouth.     Black Cohosh 40 MG CAPS Take 1 capsule by mouth daily.     Cholecalciferol (VITAMIN D-3) 5000 units TABS Take 1 tablet by mouth daily. 30 tablet    losartan-hydrochlorothiazide (HYZAAR) 100-25 MG tablet Take 0.5 tablets by mouth daily. -- Office visit needed for further refills 1 tablet 0   metFORMIN (GLUCOPHAGE) 500 MG tablet Take 1 tablet (500 mg total) by mouth daily with breakfast. 30 tablet 0   Multiple Vitamin (MULTIVITAMIN) tablet Take 1 tablet by mouth daily.     norethindrone (MICRONOR,CAMILA,ERRIN) 0.35 MG tablet Take 1 tablet by mouth daily.     Omega-3 Fatty Acids (OMEGA 3 500 PO) Take 500 mg by mouth.     vitamin E 100 UNIT capsule Take 180 Units by mouth daily.     zinc gluconate 50  MG tablet Take 50 mg by mouth daily.     No current facility-administered medications for this encounter.      REVIEW OF SYSTEMS: On review of systems, the patient reports that she is doing well overall. She is curious if she can continue with her diet regimen with the healthy weight and wellness clinic. She denies any chest pain, shortness of breath, cough, fevers, chills, night sweats, unintended weight changes. She denies any bowel or bladder disturbances, and denies abdominal pain, nausea or vomiting. She denies any new musculoskeletal or joint aches or pains. A complete review of systems is obtained and is otherwise negative.     PHYSICAL EXAM:  Wt Readings from Last 3 Encounters:  09/04/18 268 lb 3.2 oz (121.7 kg)  08/14/18 264 lb (119.7 kg)  07/29/18 265 lb  (120.2 kg)   Temp Readings from Last 3 Encounters:  09/04/18 98.5 F (36.9 C) (Oral)  08/14/18 98.1 F (36.7 C) (Oral)  07/29/18 98.2 F (36.8 C) (Oral)   BP Readings from Last 3 Encounters:  09/04/18 (!) 129/93  08/14/18 109/73  07/29/18 116/73   Pulse Readings from Last 3 Encounters:  09/04/18 65  08/14/18 75  07/29/18 76     In general this is a well appearing African American female in no acute distress. She's alert and oriented x4 and appropriate throughout the examination. Cardiopulmonary assessment is negative for acute distress and she exhibits normal effort. Breast exam is deferred.   ECOG = 1  0 - Asymptomatic (Fully active, able to carry on all predisease activities without restriction)  1 - Symptomatic but completely ambulatory (Restricted in physically strenuous activity but ambulatory and able to carry out work of a light or sedentary nature. For example, light housework, office work)  2 - Symptomatic, <50% in bed during the day (Ambulatory and capable of all self care but unable to carry out any work activities. Up and about more than 50% of waking hours)  3 - Symptomatic, >50% in bed, but not bedbound (Capable of only limited self-care, confined to bed or chair 50% or more of waking hours)  4 - Bedbound (Completely disabled. Cannot carry on any self-care. Totally confined to bed or chair)  5 - Death   Eustace Pen MM, Creech RH, Tormey DC, et al. [phone]). "Toxicity and response criteria of the [facility]". [place] Oncol. 5 (6): 649-55    LABORATORY DATA:  Lab Results  Component Value Date   WBC 5.4 09/04/2018   HGB 11.8 (L) 09/04/2018   HCT 37.0 09/04/2018   MCV 83.1 09/04/2018   PLT 260 09/04/2018   Lab Results  Component Value Date   NA 139 09/04/2018   K 3.5 09/04/2018   CL 105 09/04/2018   CO2 26 09/04/2018   Lab Results  Component Value Date   ALT 10 09/04/2018   AST 13 (L) 09/04/2018   ALKPHOS 65 09/04/2018    BILITOT 0.4 09/04/2018      RADIOGRAPHY: US Breast Ltd Uni Left Inc Axilla  Result Date: 08/23/2018 CLINICAL DATA:  53 year old female presenting for evaluation of palpable lumps in the superior left breast identified on clinical breast exam. EXAM: DIGITAL DIAGNOSTIC LEFT MAMMOGRAM WITH CAD AND TOMO ULTRASOUND LEFT BREAST COMPARISON:  Previous exam(s). ACR Breast Density Category c: The breast tissue is heterogeneously dense, which may obscure small masses. FINDINGS: No suspicious masses are seen on the tomosynthesis images of the left breast. There are 2 groups of indeterminate calcifications however,  one in the superior anterior superficial left breast which spans 8 mm. In the upper inner left breast, there is a 2.2 cm area of loosely grouped amorphous calcifications. Mammographic images were processed with CAD. Normal fibroglandular tissue in the upper inner and upper outer quadrants of the left breast. IMPRESSION: 1. There are 2 groups of indeterminate calcifications in the left breast. 2. No mammographic or targeted sonographic abnormalities in the left breast to correspond with the masses identified on clinical breast exam. RECOMMENDATION: 1. Stereotactic biopsy is recommended for the 2 groups of calcifications in the left breast. This has been scheduled for 08/29/2018 at 9:30 a.m. 2. Clinical follow-up recommended for the palpable areas of concern in the superior left breast. Any further workup should be based on clinical grounds. I have discussed the findings and recommendations with the patient. Results were also provided in writing at the conclusion of the visit. If applicable, a reminder letter will be sent to the patient regarding the next appointment. BI-RADS CATEGORY  4: Suspicious. Electronically Signed   By: Ammie Ferrier M.D.   On: 08/23/2018 10:54   Mm Diag Breast Tomo Uni Left  Result Date: 08/23/2018 CLINICAL DATA:  53 year old female presenting for evaluation of palpable lumps in  the superior left breast identified on clinical breast exam. EXAM: DIGITAL DIAGNOSTIC LEFT MAMMOGRAM WITH CAD AND TOMO ULTRASOUND LEFT BREAST COMPARISON:  Previous exam(s). ACR Breast Density Category c: The breast tissue is heterogeneously dense, which may obscure small masses. FINDINGS: No suspicious masses are seen on the tomosynthesis images of the left breast. There are 2 groups of indeterminate calcifications however, one in the superior anterior superficial left breast which spans 8 mm. In the upper inner left breast, there is a 2.2 cm area of loosely grouped amorphous calcifications. Mammographic images were processed with CAD. Normal fibroglandular tissue in the upper inner and upper outer quadrants of the left breast. IMPRESSION: 1. There are 2 groups of indeterminate calcifications in the left breast. 2. No mammographic or targeted sonographic abnormalities in the left breast to correspond with the masses identified on clinical breast exam. RECOMMENDATION: 1. Stereotactic biopsy is recommended for the 2 groups of calcifications in the left breast. This has been scheduled for 08/29/2018 at 9:30 a.m. 2. Clinical follow-up recommended for the palpable areas of concern in the superior left breast. Any further workup should be based on clinical grounds. I have discussed the findings and recommendations with the patient. Results were also provided in writing at the conclusion of the visit. If applicable, a reminder letter will be sent to the patient regarding the next appointment. BI-RADS CATEGORY  4: Suspicious. Electronically Signed   By: Ammie Ferrier M.D.   On: 08/23/2018 10:54   [place] Axilla Left  Result Date: 09/02/2018 CLINICAL DATA:  Patient with recent diagnosis of left breast invasive mammary carcinoma and DCIS. For evaluation of the left axilla. EXAM: ULTRASOUND OF THE LEFT AXILLA COMPARISON:  [facility] FINDINGS: Ultrasound is performed, showing no cortically thickened lymph node within the left  axilla. IMPRESSION: No left axillary adenopathy. RECOMMENDATION: Treatment plan for left breast malignancy. I have discussed the findings and recommendations with the patient. Results were also provided in writing at the conclusion of the visit. If applicable, a reminder letter will be sent to the patient regarding the next appointment. BI-RADS CATEGORY  6: Known biopsy-proven malignancy. Electronically Signed   By: Lovey Newcomer M.D.   On: 09/02/2018 08:56   Mm Clip Placement Left  Result Date: 08/29/2018  CLINICAL DATA:  Patient status post stereotactic guided biopsy left breast calcifications, 2 sites. EXAM: DIAGNOSTIC LEFT MAMMOGRAM POST STEREOTACTIC BIOPSY COMPARISON:  Previous exam(s). FINDINGS: Mammographic images were obtained following stereotactic guided biopsy of left breast calcifications. Site 1: Upper inner left breast: Coil shaped clip: In appropriate position. Site 2: Upper-outer left breast: X shaped clip: In appropriate position. IMPRESSION: Appropriate position biopsy marking clips status post stereotactic guided biopsy left breast calcifications, 2 sites. Final Assessment: Post Procedure Mammograms for Marker Placement Electronically Signed   By: Lovey Newcomer M.D.   On: 08/29/2018 10:51   Mm Lt Breast Bx W Loc Dev 1st Lesion Image Bx Spec Stereo Guide  Addendum Date: 08/30/2018   ADDENDUM REPORT: 08/30/2018 14:03 ADDENDUM: Pathology revealed GRADE II INVASIVE MAMMARY CARCINOMA, MAMMARY CARCINOMA IN SITU, LYMPHOVASCULAR SPACE INVASION PRESENT of the Left breast, upper inner. HIGH GRADE DUCTAL CARCINOMA IN SITU, CALCIFICATIONS of the Left breast, upper outer. This was found to be concordant by Dr. Lovey Newcomer. Pathology results were discussed with the patient by telephone. The patient reported doing well after the biopsies with tenderness at the sites. Post biopsy instructions and care were reviewed and questions were answered. The patient was encouraged to call [facility] for any additional concerns. The patient was referred to [facility] on September 04, 2018. Pathology results reported by Terie Purser, RN on 08/30/2018. Electronically Signed   By: Lovey Newcomer M.D.   On: 08/30/2018 14:03   Result Date: 08/30/2018 CLINICAL DATA:  Patient with indeterminate left breast calcifications, 2 sites. EXAM: LEFT BREAST STEREOTACTIC CORE NEEDLE BIOPSY COMPARISON:  Previous exams. FINDINGS: The patient and I discussed the procedure of stereotactic-guided biopsy including benefits and alternatives. We discussed the high likelihood of a successful procedure. We discussed the risks of the procedure including infection, bleeding, tissue injury, clip migration, and inadequate sampling. Informed written consent was given. The usual time out protocol was performed immediately prior to the procedure. Site 1: Upper inner left breast anterior depth: Coil shaped clip. Using sterile technique and 1% Lidocaine as local anesthetic, under stereotactic guidance, a 9 gauge vacuum assisted device was used to perform core needle biopsy of calcifications within the upper inner left breast using a cranial approach. Specimen radiograph was performed showing calcifications. Specimens with calcifications are identified for pathology. Lesion quadrant: Upper inner quadrant At the conclusion of the procedure, a coil shaped tissue marker clip was deployed into the biopsy cavity. Follow-up 2-view mammogram was performed and dictated separately. Site 2: Upper-outer left breast anterior depth: X shaped clip. Using sterile technique and 1% Lidocaine as local anesthetic, under stereotactic guidance, a 9 gauge vacuum assisted device was used to perform core needle biopsy of calcifications within the upper-outer left breast using a cranial approach. Specimen radiograph was performed showing calcifications. Specimens with calcifications are  identified for pathology. Lesion quadrant: Upper outer quadrant At the conclusion of the procedure, a X shaped tissue marker clip was deployed into the biopsy cavity. Follow-up 2-view mammogram was performed and dictated separately. IMPRESSION: Stereotactic-guided biopsy of left breast calcifications, 2 sites. No apparent complications. Electronically Signed: By: Lovey Newcomer M.D. On: 08/29/2018 10:50   Mm Lt Breast Bx W Loc Dev Ea Ad Lesion Img Bx Spec Stereo Guide  Addendum Date: 08/30/2018   ADDENDUM REPORT: 08/30/2018 14:03 ADDENDUM: Pathology revealed GRADE II INVASIVE MAMMARY CARCINOMA, MAMMARY CARCINOMA IN SITU, LYMPHOVASCULAR SPACE INVASION PRESENT of the Left  breast, upper inner. HIGH GRADE DUCTAL CARCINOMA IN SITU, CALCIFICATIONS of the Left breast, upper outer. This was found to be concordant by Dr. Lovey Newcomer. Pathology results were discussed with the patient by telephone. The patient reported doing well after the biopsies with tenderness at the sites. Post biopsy instructions and care were reviewed and questions were answered. The patient was encouraged to call [facility] [place] for any additional concerns. The patient was referred to [facility] [facility] at [facility] on September 04, 2018. Pathology results reported by Terie Purser, RN on 08/30/2018. Electronically Signed   By: Lovey Newcomer M.D.   On: 08/30/2018 14:03   Result Date: 08/30/2018 CLINICAL DATA:  Patient with indeterminate left breast calcifications, 2 sites. EXAM: LEFT BREAST STEREOTACTIC CORE NEEDLE BIOPSY COMPARISON:  Previous exams. FINDINGS: The patient and I discussed the procedure of stereotactic-guided biopsy including benefits and alternatives. We discussed the high likelihood of a successful procedure. We discussed the risks of the procedure including infection, bleeding, tissue injury, clip migration, and inadequate sampling. Informed written consent  was given. The usual time out protocol was performed immediately prior to the procedure. Site 1: Upper inner left breast anterior depth: Coil shaped clip. Using sterile technique and 1% Lidocaine as local anesthetic, under stereotactic guidance, a 9 gauge vacuum assisted device was used to perform core needle biopsy of calcifications within the upper inner left breast using a cranial approach. Specimen radiograph was performed showing calcifications. Specimens with calcifications are identified for pathology. Lesion quadrant: Upper inner quadrant At the conclusion of the procedure, a coil shaped tissue marker clip was deployed into the biopsy cavity. Follow-up 2-view mammogram was performed and dictated separately. Site 2: Upper-outer left breast anterior depth: X shaped clip. Using sterile technique and 1% Lidocaine as local anesthetic, under stereotactic guidance, a 9 gauge vacuum assisted device was used to perform core needle biopsy of calcifications within the upper-outer left breast using a cranial approach. Specimen radiograph was performed showing calcifications. Specimens with calcifications are identified for pathology. Lesion quadrant: Upper outer quadrant At the conclusion of the procedure, a X shaped tissue marker clip was deployed into the biopsy cavity. Follow-up 2-view mammogram was performed and dictated separately. IMPRESSION: Stereotactic-guided biopsy of left breast calcifications, 2 sites. No apparent complications. Electronically Signed: By: Lovey Newcomer M.D. On: 08/29/2018 10:50       IMPRESSION/PLAN: 1. 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 Dr. Lisbeth Renshaw discusses the pathology findings and reviews the nature of invasive breast disease. The consensus from the breast conference includes breast conservation with lumpectomy with  sentinel node biopsy versus mastectomy sentinel node biopsy. The patient has decided to proceed  with mastectomy, and we discussed the scenarios for which adjuvant radiotherapy following mastectomy may be considered. At this time, Dr. Lisbeth Renshaw discusses that there does not appear to be any high risk features that would indicate a role for treatment, however discussed that final pathology would clarify this. We discussed the risks, benefits, short, and long term effects of radiotherapy, if it became indicated. She is in agreement with this plan and we will follow up with the results of her surgery.  2. Dietary considerations. She will discuss her interest in continuing with weight loss with Dr. Marily Memos.   In a visit lasting 30 minutes, greater than 50% of the time was spent face to face discussing her case, and coordinating the  patient's care.  The above documentation reflects my direct findings during this shared patient visit. Please see the separate note by Dr. Lisbeth Renshaw on this date for the remainder of the patient's plan of care.    Carola Rhine, PAC

## 2018-09-04 NOTE — Telephone Encounter (Signed)
error 

## 2018-09-04 NOTE — Telephone Encounter (Signed)
Ordered oncotype (core) per Dr. Jana Hakim.  Faxed requisition to pathology and confirmed receipt.

## 2018-09-04 NOTE — Telephone Encounter (Signed)
Please review

## 2018-09-04 NOTE — Patient Instructions (Signed)

## 2018-09-06 ENCOUNTER — Other Ambulatory Visit: Payer: Self-pay

## 2018-09-06 ENCOUNTER — Ambulatory Visit: Payer: BC Managed Care – PPO | Admitting: Physical Therapy

## 2018-09-06 ENCOUNTER — Encounter: Payer: Self-pay | Admitting: Physical Therapy

## 2018-09-06 DIAGNOSIS — M25612 Stiffness of left shoulder, not elsewhere classified: Secondary | ICD-10-CM

## 2018-09-06 DIAGNOSIS — R293 Abnormal posture: Secondary | ICD-10-CM

## 2018-09-06 NOTE — Therapy (Signed)
[place], [place], [address] Phone: [phone]   Fax:  [phone]  Physical Therapy Treatment  Patient Details  Name: Roberta [name] MRN: [id number] Date of Birth: 1965/11/06 Referring Provider (PT): Dr. Fanny Skates   Encounter Date: 09/06/2018  PT End of Session - 09/06/18 1122    Visit Number  2    Number of Visits  9    Date for PT Re-Evaluation  10/02/18    PT Start Time  1034    PT Stop Time  1116    PT Time Calculation (min)  42 min    Activity Tolerance  Patient tolerated treatment well    Behavior During Therapy  [facility] for tasks assessed/performed       Past Medical History:  Diagnosis Date  . Back pain   . Hip pain   . Hypertension   . Obesity   . Prediabetes   . Swelling    bilat LE    Past Surgical History:  Procedure Laterality Date  . CHOLECYSTECTOMY    . GASTRIC BYPASS    . KNEE SURGERY  2002   Ligament repair    There were no vitals filed for this visit.  Subjective Assessment - 09/06/18 1035    Subjective  The exercises are going but I am still stiff.    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 The invasive mass measures 2.2 cm and is located in the upper inner quadrant. The DCIS measures 8 mm. It is ER/PR positive and HER2 negative with a Ki67 of 10%. She had gastric bypass surgery in 2005, is pre-diabetic, and has hypertension.    Patient Stated Goals  Learn post op shoulder ROM HEP, learn about lymphedema risk reduction, and be able to reach overhead without difficulty    Currently in Pain?  No/denies         [facility] PT Assessment - 09/06/18 0001      AROM   Left Shoulder Flexion  135 Degrees    Left Shoulder ABduction  155 Degrees    Left Shoulder External Rotation  65 Degrees                   [facility] Adult PT Treatment/Exercise - 09/06/18 0001      Shoulder  Exercises: Pulleys   Flexion  2 minutes   pt returned therapist demo, cues not to hike shoulder   ABduction  2 minutes   pt returned therapist demo, v/c not to hike L shoulder     Shoulder Exercises: Therapy Ball   Flexion  10 reps   pt returned therapist demo   ABduction  Left;10 reps   pt returned therapist demo     Manual Therapy   Manual Therapy  Passive ROM;Joint mobilization;Myofascial release;Scapular mobilization    Joint Mobilization  grade I/II posterior and inferior shoulder mobs to L shoulder    Myofascial Release  L UE pulling in to abduction    Scapular Mobilization  in R s/l to L shoulder into protraction and retraction while gently pulling UE and moving through ROM    Passive ROM  to L shoulder in supine with slight manual traction in to abduction and flexion then in to external rotation                  PT Long Term Goals - 09/04/18 1146      PT  LONG TERM GOAL #1   Title  Patient will be independent with a shoulder ROM HEP to improve ROM    Time  4    Period  Weeks    Status  New    Target Date  10/02/18      PT LONG TERM GOAL #2   Title  Patient will increase left shoulder active flexion ROM to >/= 130 degrees for increased ease reaching overhead.    Baseline  104 degrees    Time  4    Period  Weeks    Status  New    Target Date  10/02/18      PT LONG TERM GOAL #3   Title  Patient will increase left active shoulder abduction to >/= 140 degrees for increased ease obtaining radiation positioning.    Baseline  105 degrees    Time  4    Period  Weeks    Status  New    Target Date  10/02/18      PT LONG TERM GOAL #4   Title  Patient will report >/= 25% improvement in her ability to perform normal daily tasks without feeling some end ROM limitation.    Time  4    Period  Weeks    Status  New            Plan - 09/06/18 1123    Clinical Impression Statement  Pt returned for follow up appointment today. Began gentle AAROM exercises with  cues for pt not to hike shoulder. Began manual therapy including joint mobilizations, scapular moblizations and PROM with good success. Pt's flexion ROM increased by 30 degrees since time of eval and abduction increased by 50 degrees. Encouraged pt to stretch shoulder over the weekend and continue with current exercise and use shoulder as much as she can for reaching. Will upgrade HEP at next visit.    Rehab Potential  Excellent    PT Frequency  2x / week    PT Duration  4 weeks    PT Treatment/Interventions  ADLs/Self Care Home Management;Therapeutic exercise;Manual techniques;Patient/family education;Moist Heat    PT Next Visit Plan  PROM, shoulder mobs, ROM exercises, upgrade pt's HEP- supine dowel? supine scap?    PT Home Exercise Plan  Post op shoulder ROM HEP    Consulted and Agree with Plan of Care  Patient       Patient will benefit from skilled therapeutic intervention in order to improve the following deficits and impairments:  Decreased knowledge of precautions, Impaired UE functional use, Pain, Postural dysfunction, Decreased range of motion  Visit Diagnosis: Stiffness of left shoulder, not elsewhere classified  Abnormal posture     Problem List Patient Active Problem List   Diagnosis Date Noted  . Morbid obesity with BMI of 40.0-44.9, adult ([place]) 09/04/2018  . Malignant neoplasm of upper-inner quadrant of left breast in female, estrogen receptor positive ([place]) 09/02/2018  . Vitamin D deficiency 02/04/2018  . Urticaria 01/17/2018  . Muscle spasm 08/28/2017  . Weight gain 06/25/2017  . 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 Left hip pain 02/10/2017  . Anemia 01/21/2017  . Acute back pain 06/19/2016  . 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 Prediabetes 12/13/2015  . Sensation of lump in throat 12/13/2015  . Leg edema 12/13/2015  . Dense breasts 12/13/2011  . 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 Hypertension 11/10/2011    [name] [facility] 09/06/2018, 11:27  AM  [place]  [place], [place], [address] Phone: [phone]   Fax:  [phone]  Name: Roberta [name] MRN: [id number] Date of Birth: [birth year]  Manus Gunning, PT 09/06/18 11:27 AM

## 2018-09-10 ENCOUNTER — Encounter: Payer: Self-pay | Admitting: Physical Therapy

## 2018-09-10 ENCOUNTER — Other Ambulatory Visit: Payer: Self-pay

## 2018-09-10 ENCOUNTER — Ambulatory Visit: Payer: BC Managed Care – PPO | Admitting: Physical Therapy

## 2018-09-10 DIAGNOSIS — M25612 Stiffness of left shoulder, not elsewhere classified: Secondary | ICD-10-CM | POA: Diagnosis not present

## 2018-09-10 DIAGNOSIS — R293 Abnormal posture: Secondary | ICD-10-CM

## 2018-09-10 NOTE — Patient Instructions (Signed)

## 2018-09-10 NOTE — Therapy (Signed)
[place], [place], [address] Phone: [phone]   Fax:  [phone]  Physical Therapy Treatment  Patient Details  Name: Roberta [name] MRN: [id number] Date of Birth: 1965/05/10 Referring Provider (PT): Dr. Fanny Skates   Encounter Date: 09/10/2018  PT End of Session - 09/10/18 1626    Visit Number  3    Number of Visits  9    Date for PT Re-Evaluation  10/02/18    PT Start Time  1430    PT Stop Time  1515    PT Time Calculation (min)  45 min    Activity Tolerance  Patient tolerated treatment well       Past Medical History:  Diagnosis Date  . Back pain   . Hip pain   . Hypertension   . Obesity   . Prediabetes   . Swelling    bilat LE    Past Surgical History:  Procedure Laterality Date  . CHOLECYSTECTOMY    . GASTRIC BYPASS    . KNEE SURGERY  2002   Ligament repair    There were no vitals filed for this visit.  Subjective Assessment - 09/10/18 1436    Subjective  Pt states she will meet with Dr. Dalbert Batman next week and hopes to have surgery scheduled then. Pt states that her back is fiving her a fit today.  She started back to walking and the music may have been too fast.  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 The invasive mass measures 2.2 cm and is located in the upper inner quadrant. The DCIS measures 8 mm. It is ER/PR positive and HER2 negative with a Ki67 of 10%. She had gastric bypass surgery in 2005, is pre-diabetic, and has hypertension.    Currently in Pain?  No/denies                       [facility] Adult PT Treatment/Exercise - 09/10/18 0001      Exercises   Exercises  Neck;Shoulder      Neck Exercises: Machines for Strengthening   Other Machines for Strengthening  sitting ROM for neck and upper thoracic       Shoulder Exercises:  Supine   Other Supine Exercises  supine dowel stretch       Shoulder Exercises: Pulleys   Flexion  2 minutes   pt returned therapist demo, cues not to hike shoulder   ABduction  2 minutes   pt returned therapist demo, v/c not to hike L shoulder     Shoulder Exercises: ROM/Strengthening   Wall Wash  with folded towel and for flexion to points on the clock     Ball on Wall  up the wall with yellow ball      Other ROM/Strengthening Exercises  back to wall for retraction     Other ROM/Strengthening Exercises  corner stretch with down and extended for pec minor stretch       Manual Therapy   Joint Mobilization  grade I/II posterior and inferior shoulder mobs to L shoulder    Myofascial Release  L UE pulling in to abduction    Passive ROM  to L shoulder in supine with slight manual traction in to abduction and flexion then in to external rotation  PT Long Term Goals - 09/04/18 1146      PT LONG TERM GOAL #1   Title  Patient will be independent with a shoulder ROM HEP to improve ROM    Time  4    Period  Weeks    Status  New    Target Date  10/02/18      PT LONG TERM GOAL #2   Title  Patient will increase left shoulder active flexion ROM to >/= 130 degrees for increased ease reaching overhead.    Baseline  104 degrees    Time  4    Period  Weeks    Status  New    Target Date  10/02/18      PT LONG TERM GOAL #3   Title  Patient will increase left active shoulder abduction to >/= 140 degrees for increased ease obtaining radiation positioning.    Baseline  105 degrees    Time  4    Period  Weeks    Status  New    Target Date  10/02/18      PT LONG TERM GOAL #4   Title  Patient will report >/= 25% improvement in her ability to perform normal daily tasks without feeling some end ROM limitation.    Time  4    Period  Weeks    Status  New            Plan - 09/10/18 1627    Clinical Impression Statement  Pt is doing well with stretches at home  but still has some pain at endrange with decreased ROM of left shoulder. Discussed options for sleep to try to decrease pain    PT Duration  2 weeks    PT Treatment/Interventions  ADLs/Self Care Home Management;Therapeutic exercise;Manual techniques;Patient/family education;Moist Heat    PT Next Visit Plan  PROM, shoulder mobs, ROM exercises, upgrade pt's HEP-  supine scap?    Consulted and Agree with Plan of Care  Patient       Patient will benefit from skilled therapeutic intervention in order to improve the following deficits and impairments:  Decreased knowledge of precautions, Impaired UE functional use, Pain, Postural dysfunction, Decreased range of motion  Visit Diagnosis: Stiffness of left shoulder, not elsewhere classified  Abnormal posture     Problem List Patient Active Problem List   Diagnosis Date Noted  . Morbid obesity with BMI of 40.0-44.9, adult ([place]) 09/04/2018  . Malignant neoplasm of upper-inner quadrant of left breast in female, estrogen receptor positive ([place]) 09/02/2018  . Vitamin D deficiency 02/04/2018  . Urticaria 01/17/2018  . Muscle spasm 08/28/2017  . Weight gain 06/25/2017  . 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 Left hip pain 02/10/2017  . Anemia 01/21/2017  . Acute back pain 06/19/2016  . 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 Prediabetes 12/13/2015  . Sensation of lump in throat 12/13/2015  . Leg edema 12/13/2015  . Dense breasts 12/13/2011  . 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 Hypertension 11/10/2011   Donato Heinz. Owens Shark PT  Norwood Levo 09/10/2018, 4:31 PM  [place], [place], [address] Phone: [phone]   Fax:  [phone]  Name: Roberta [name] MRN: [id number] Date of Birth: [birth year]

## 2018-09-11 ENCOUNTER — Telehealth: Payer: Self-pay | Admitting: *Deleted

## 2018-09-11 NOTE — Telephone Encounter (Signed)
Spoke to pt concerning Pink from 9.2.20. Denies questions or concern regarding dx or treatment care plan. Encourage pt to call with needs. Received verbal understanding.

## 2018-09-12 ENCOUNTER — Ambulatory Visit: Payer: BC Managed Care – PPO | Admitting: Rehabilitation

## 2018-09-12 ENCOUNTER — Encounter: Payer: Self-pay | Admitting: Rehabilitation

## 2018-09-12 ENCOUNTER — Other Ambulatory Visit: Payer: Self-pay

## 2018-09-12 DIAGNOSIS — R293 Abnormal posture: Secondary | ICD-10-CM

## 2018-09-12 DIAGNOSIS — C50212 Malignant neoplasm of upper-inner quadrant of left female breast: Secondary | ICD-10-CM

## 2018-09-12 DIAGNOSIS — Z170 Estrogen receptor positive status [ER+]: Secondary | ICD-10-CM

## 2018-09-12 DIAGNOSIS — M25612 Stiffness of left shoulder, not elsewhere classified: Secondary | ICD-10-CM | POA: Diagnosis not present

## 2018-09-12 NOTE — Therapy (Signed)
[place], [place], [address] Phone: [phone]   Fax:  [phone]  Physical Therapy Treatment  Patient Details  Name: Roberta [name] MRN: [id number] Date of Birth: 1965-11-02 Referring Provider (PT): Dr. Fanny Skates   Encounter Date: 09/12/2018  PT End of Session - 09/12/18 0917    Visit Number  4    Number of Visits  9    PT Start Time  0830    PT Stop Time  0912    PT Time Calculation (min)  42 min    Activity Tolerance  Patient tolerated treatment well    Behavior During Therapy  [facility] for tasks assessed/performed       Past Medical History:  Diagnosis Date  . Back pain   . Hip pain   . Hypertension   . Obesity   . Prediabetes   . Swelling    bilat LE    Past Surgical History:  Procedure Laterality Date  . CHOLECYSTECTOMY    . GASTRIC BYPASS    . KNEE SURGERY  2002   Ligament repair    There were no vitals filed for this visit.  Subjective Assessment - 09/12/18 0831    Subjective  I always feel better when I leave.    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 The invasive mass measures 2.2 cm and is located in the upper inner quadrant. The DCIS measures 8 mm. It is ER/PR positive and HER2 negative with a Ki67 of 10%. She had gastric bypass surgery in 2005, is pre-diabetic, and has hypertension.    Currently in Pain?  No/denies         [facility] PT Assessment - 09/12/18 0001      AROM   Left Shoulder Flexion  146 Degrees    Left Shoulder ABduction  125 Degrees      Strength   Left Shoulder Flexion  5/5    Left Shoulder Extension  5/5    Left Shoulder ABduction  5/5    Left Shoulder Internal Rotation  5/5                   [facility] Adult PT Treatment/Exercise - 09/12/18 0001      Shoulder Exercises: Supine   Other Supine Exercises  supine dowel stretch       Shoulder Exercises:  Sidelying   ABduction  Left;10 reps      Shoulder Exercises: Pulleys   Flexion  2 minutes    ABduction  2 minutes      Shoulder Exercises: Therapy Ball   Flexion  10 reps    ABduction  Left;10 reps      Shoulder Exercises: ROM/Strengthening   Other ROM/Strengthening Exercises  forearm roll up the wall for latissimus stretch      Manual Therapy   Joint Mobilization  grade IV- 3x30" AP and inferior glides in 90deg of abduction    Scapular Mobilization  in right sidelying to the left scapula into all directions    Passive ROM  to the Lt shoulder to tolerance                  PT Long Term Goals - 09/04/18 1146      PT LONG TERM GOAL #1   Title  Patient will be independent with a shoulder ROM HEP to improve ROM    Time  4  Period  Weeks    Status  New    Target Date  10/02/18      PT LONG TERM GOAL #2   Title  Patient will increase left shoulder active flexion ROM to >/= 130 degrees for increased ease reaching overhead.    Baseline  104 degrees    Time  4    Period  Weeks    Status  New    Target Date  10/02/18      PT LONG TERM GOAL #3   Title  Patient will increase left active shoulder abduction to >/= 140 degrees for increased ease obtaining radiation positioning.    Baseline  105 degrees    Time  4    Period  Weeks    Status  New    Target Date  10/02/18      PT LONG TERM GOAL #4   Title  Patient will report >/= 25% improvement in her ability to perform normal daily tasks without feeling some end ROM limitation.    Time  4    Period  Weeks    Status  New            Plan - 09/12/18 0919    Clinical Impression Statement  Pt has improved AROM and PROM since last measurements and reports subjective improvements.  Most likely 1 more visit despite having more scheduled with patient agreeable to this plan.  continue Left shoulder ROM and HEP finalization.    PT Frequency  2x / week    PT Duration  2 weeks    PT Treatment/Interventions  ADLs/Self  Care Home Management;Therapeutic exercise;Manual techniques;Patient/family education;Moist Heat    PT Next Visit Plan  PROM, shoulder mobs, ROM exercises, upgrade pt's HEP-  supine scap?    PT Home Exercise Plan  Post op shoulder ROM HEP    Consulted and Agree with Plan of Care  Patient       Patient will benefit from skilled therapeutic intervention in order to improve the following deficits and impairments:     Visit Diagnosis: Stiffness of left shoulder, not elsewhere classified  Abnormal posture  Malignant neoplasm of upper-inner quadrant of left breast in female, estrogen receptor positive ([place])     Problem List Patient Active Problem List   Diagnosis Date Noted  . Morbid obesity with BMI of 40.0-44.9, adult ([place]) 09/04/2018  . Malignant neoplasm of upper-inner quadrant of left breast in female, estrogen receptor positive ([place]) 09/02/2018  . Vitamin D deficiency 02/04/2018  . Urticaria 01/17/2018  . Muscle spasm 08/28/2017  . Weight gain 06/25/2017  . 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 Left hip pain 02/10/2017  . Anemia 01/21/2017  . Acute back pain 06/19/2016  . 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 Prediabetes 12/13/2015  . Sensation of lump in throat 12/13/2015  . Leg edema 12/13/2015  . Dense breasts 12/13/2011  . 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 Hypertension 11/10/2011    Shan Levans, PT 09/12/2018, 9:21 AM  [place], [place], [address] Phone: [phone]   Fax:  [phone]  Name: Roberta [name] MRN: [id number] Date of Birth: 03/29/1965

## 2018-09-13 ENCOUNTER — Telehealth: Payer: Self-pay | Admitting: *Deleted

## 2018-09-13 NOTE — Telephone Encounter (Signed)
This RN returned VM from [place] with [place] per additional needed information to proceed with Oncotype request. Myrtie Neither states current path does not give type of cancer as well as wanted to verify path is biospy or surgical.  This RN reviewed pathology from 08/29/2018- and Stormy's copy does not have the information given on page 2 of current report in [place].  This RN will fax path report as well as MD information per assessment regarding further diagnostic information.  Per dictation from visit on 09/04/2018:  ASSESSMENT: 53 y.o. [place], [place] woman status post left breast upper inner quadrant biopsy for a clinical T2 N0, stage IB invasive ductal carcinoma, grade 2, estrogen and progesterone receptor positive, HER-2 not amplified, with an MIB-1-1 of 2%  (a) a second biopsy same day different quadrant showed ductal carcinoma in situ  (1) definitive surgery pending  (2) Oncotype to be obtained from the original biopsy  (3) adjuvant radiation  (4) Antiestrogens to follow at the completion of local treatment  Above will be faxed to [phone] Return call number for Stormy is [phone]

## 2018-09-16 ENCOUNTER — Telehealth: Payer: Self-pay | Admitting: *Deleted

## 2018-09-16 ENCOUNTER — Other Ambulatory Visit: Payer: Self-pay

## 2018-09-16 ENCOUNTER — Encounter: Payer: Self-pay | Admitting: Rehabilitation

## 2018-09-16 ENCOUNTER — Ambulatory Visit: Payer: BC Managed Care – PPO | Admitting: Rehabilitation

## 2018-09-16 DIAGNOSIS — Z170 Estrogen receptor positive status [ER+]: Secondary | ICD-10-CM

## 2018-09-16 DIAGNOSIS — M25612 Stiffness of left shoulder, not elsewhere classified: Secondary | ICD-10-CM | POA: Diagnosis not present

## 2018-09-16 DIAGNOSIS — C50212 Malignant neoplasm of upper-inner quadrant of left female breast: Secondary | ICD-10-CM

## 2018-09-16 DIAGNOSIS — R293 Abnormal posture: Secondary | ICD-10-CM

## 2018-09-16 NOTE — Therapy (Signed)
[place], [place], [address] Phone: [phone]   Fax:  [phone]  Physical Therapy Treatment  Patient Details  Name: Roberta [name] MRN: [id number] Date of Birth: 07/07/1965 Referring Provider (PT): Dr. Fanny Skates   Encounter Date: 09/16/2018  PT End of Session - 09/16/18 1349    Visit Number  5    Number of Visits  9    Date for PT Re-Evaluation  10/02/18    PT Start Time  1300    PT Stop Time  1338    PT Time Calculation (min)  38 min    Activity Tolerance  Patient tolerated treatment well    Behavior During Therapy  [facility] for tasks assessed/performed       Past Medical History:  Diagnosis Date  . Back pain   . Hip pain   . Hypertension   . Obesity   . Prediabetes   . Swelling    bilat LE    Past Surgical History:  Procedure Laterality Date  . CHOLECYSTECTOMY    . GASTRIC BYPASS    . KNEE SURGERY  2002   Ligament repair    There were no vitals filed for this visit.  Subjective Assessment - 09/16/18 1301    Subjective  I am good to go.  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 The invasive mass measures 2.2 cm and is located in the upper inner quadrant. The DCIS measures 8 mm. It is ER/PR positive and HER2 negative with a Ki67 of 10%. She had gastric bypass surgery in 2005, is pre-diabetic, and has hypertension.    Patient Stated Goals  Learn post op shoulder ROM HEP, learn about lymphedema risk reduction, and be able to reach overhead without difficulty    Currently in Pain?  No/denies         [facility] PT Assessment - 09/16/18 0001      AROM   Left Shoulder Flexion  147 Degrees    Left Shoulder ABduction  142 Degrees    Left Shoulder Internal Rotation  --   behind the head WNL   Left Shoulder External Rotation  --   behind the head WNL                   [facility] Adult PT Treatment/Exercise - 09/16/18 0001      Shoulder Exercises: Standing   Row  10 reps;Theraband    Theraband Level (Shoulder Row)  Level 1 (Yellow)    Other Standing Exercises  shoulder flexion, ER, IR and extension yellow band x 10 each with cueing for performance and scapular depression      Shoulder Exercises: Pulleys   Flexion  2 minutes    ABduction  2 minutes      Shoulder Exercises: Therapy Ball   Flexion  10 reps                  PT Long Term Goals - 09/16/18 1309      PT LONG TERM GOAL #1   Title  Patient will be independent with a shoulder ROM HEP to improve ROM    Status  Achieved      PT LONG TERM GOAL #2   Title  Patient will increase left shoulder active flexion ROM to >/= 130 degrees for increased ease reaching overhead.  Status  Achieved      PT LONG TERM GOAL #3   Title  Patient will increase left active shoulder abduction to >/= 140 degrees for increased ease obtaining radiation positioning.    Status  Achieved      PT LONG TERM GOAL #4   Title  Patient will report >/= 25% improvement in her ability to perform normal daily tasks without feeling some end ROM limitation.    Status  Achieved            Plan - 09/16/18 1307    Clinical Impression Statement  Doing ready for DC at this point but advised follow up around after surgery to see if any visits are needed post op.  Educated patient on returning to post op exercises 1-2 weeks post op or when drains are removed.Inez Catalina will call patient for post op appointment scheduling.  Pt has met all goals and reports return to full ROM and no pain.    PT Treatment/Interventions  ADLs/Self Care Home Management;Therapeutic exercise;Manual techniques;Patient/family education;Moist Heat    PT Next Visit Plan  post op visit    PT Home Exercise Plan  Post op shoulder ROM HEP,    Consulted and Agree with Plan of Care  Patient       Patient will benefit from  skilled therapeutic intervention in order to improve the following deficits and impairments:     Visit Diagnosis: Stiffness of left shoulder, not elsewhere classified  Abnormal posture  Malignant neoplasm of upper-inner quadrant of left breast in female, estrogen receptor positive ([place])     Problem List Patient Active Problem List   Diagnosis Date Noted  . Morbid obesity with BMI of 40.0-44.9, adult ([place]) 09/04/2018  . Malignant neoplasm of upper-inner quadrant of left breast in female, estrogen receptor positive ([place]) 09/02/2018  . Vitamin D deficiency 02/04/2018  . Urticaria 01/17/2018  . Muscle spasm 08/28/2017  . Weight gain 06/25/2017  . 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 Left hip pain 02/10/2017  . Anemia 01/21/2017  . Acute back pain 06/19/2016  . 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 Prediabetes 12/13/2015  . Sensation of lump in throat 12/13/2015  . Leg edema 12/13/2015  . Dense breasts 12/13/2011  . 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 Hypertension 11/10/2011    Stark Bray 09/16/2018, 1:51 PM  [place], [place], [address] Phone: [phone]   Fax:  [phone]  Name: Roberta [name] MRN: [id number] Date of Birth: 1965-04-23

## 2018-09-16 NOTE — Telephone Encounter (Signed)
"  Myrtie Neither [place] [phone]). Have not received pathology requested last week.  Report may support medical necessity.  Please fax to [phone] by 12:00 pm today or oncotype will be denied."  Confirmed fax number.

## 2018-09-17 ENCOUNTER — Telehealth: Payer: Self-pay | Admitting: *Deleted

## 2018-09-17 NOTE — Telephone Encounter (Signed)
Oncotype is ordered by navigators- will forward message- thank you

## 2018-09-17 NOTE — Telephone Encounter (Signed)
Received oncotype score of 20/6%. Physician team notified. Called pt with results and discussed that as of right now she does not need chemotherapy. Pt scheduled to see Dr. Dalbert Batman on 9/16 to discuss final sx plans. Encourage pt to call with needs or questions. Received verbal understanding.

## 2018-09-18 ENCOUNTER — Other Ambulatory Visit: Payer: Self-pay | Admitting: General Surgery

## 2018-09-18 DIAGNOSIS — C50812 Malignant neoplasm of overlapping sites of left female breast: Secondary | ICD-10-CM

## 2018-09-18 NOTE — Telephone Encounter (Signed)
Sorry, I know.  She asked for you by name and there's a note.

## 2018-09-23 ENCOUNTER — Encounter: Payer: Self-pay | Admitting: Internal Medicine

## 2018-09-23 ENCOUNTER — Telehealth: Payer: Self-pay | Admitting: Adult Health

## 2018-09-23 ENCOUNTER — Encounter: Payer: Self-pay | Admitting: *Deleted

## 2018-09-23 NOTE — Telephone Encounter (Signed)
Scheduled appt per 9/21 sch message - unable to reach pt - mailed letter with appt date and time

## 2018-09-24 ENCOUNTER — Telehealth: Payer: Self-pay

## 2018-09-24 ENCOUNTER — Ambulatory Visit (INDEPENDENT_AMBULATORY_CARE_PROVIDER_SITE_OTHER): Payer: BC Managed Care – PPO | Admitting: Family Medicine

## 2018-09-24 ENCOUNTER — Encounter (INDEPENDENT_AMBULATORY_CARE_PROVIDER_SITE_OTHER): Payer: Self-pay | Admitting: Family Medicine

## 2018-09-24 ENCOUNTER — Other Ambulatory Visit: Payer: Self-pay

## 2018-09-24 ENCOUNTER — Encounter: Payer: Self-pay | Admitting: Oncology

## 2018-09-24 VITALS — BP 117/77 | HR 75 | Temp 97.9°F | Ht 66.0 in | Wt 262.0 lb

## 2018-09-24 DIAGNOSIS — R7303 Prediabetes: Secondary | ICD-10-CM

## 2018-09-24 DIAGNOSIS — F419 Anxiety disorder, unspecified: Secondary | ICD-10-CM

## 2018-09-24 DIAGNOSIS — Z6841 Body Mass Index (BMI) 40.0 and over, adult: Secondary | ICD-10-CM

## 2018-09-24 DIAGNOSIS — Z9189 Other specified personal risk factors, not elsewhere classified: Secondary | ICD-10-CM

## 2018-09-24 DIAGNOSIS — F32A Depression, unspecified: Secondary | ICD-10-CM

## 2018-09-24 DIAGNOSIS — F329 Major depressive disorder, single episode, unspecified: Secondary | ICD-10-CM

## 2018-09-24 MED ORDER — METFORMIN HCL 500 MG PO TABS: 500.0000 mg | ORAL_TABLET | Freq: Every day | ORAL | 0 refills | Status: DC

## 2018-09-24 MED ORDER — SERTRALINE HCL 25 MG PO TABS: 25.0000 mg | ORAL_TABLET | Freq: Every day | ORAL | 0 refills | Status: DC

## 2018-09-24 NOTE — Pre-Procedure Instructions (Signed)
Roberta [name]  09/24/2018      [facility] [address] [place], [place] [address] Phone: [phone] Fax: [phone]    Your procedure is scheduled on Tuesday, 10/01/2018.  Report to Main Entrance "A" [place] Admitting at 05:30 A.M.  Call this number if you have problems the morning of surgery:  [phone]   Remember:  Do not eat after midnight.  You may drink clear liquids until 04:30am the morning of your surgery .   Clear liquids allowed are:  Water, Juice (non-citric and without pulp), Carbonated beverages, Clear Tea, Black Coffee only, Plain Jell-O only and Gatorade    You will not take any medications the morning of surgery.  7 days prior to surgery STOP taking any Aspirin (unless otherwise instructed by your surgeon) or Aspirin containing products; NSAIDS - Aleve, Naproxen, Ibuprofen, Motrin, Advil, Goody's, BC's; all herbal medications; fish oil; and all vitamins,  WHAT DO I DO ABOUT MY DIABETES MEDICATION?  Marland Kitchen Do not take oral diabetes medicines (pills) the morning of surgery. - Do not take your Metformin (Glucophage) the morning of surgery.    How to Manage Your Diabetes Before and After Surgery  Why is it important to control my blood sugar before and after surgery? . Improving blood sugar levels before and after surgery helps healing and can limit problems. . A way of improving blood sugar control is eating a healthy diet by: o  Eating less sugar and carbohydrates o  Increasing activity/exercise o  Talking with your doctor about reaching your blood sugar goals . High blood sugars (greater than 180 mg/dL) can raise your risk of infections and slow your recovery, so you will need to focus on controlling your diabetes during the weeks before surgery. . Make sure that the doctor who takes care of your diabetes knows about your planned surgery including the date and  location.  How do I manage my blood sugar before surgery? . Check your blood sugar at least 4 times a day, starting 2 days before surgery, to make sure that the level is not too high or low. o Check your blood sugar the morning of your surgery when you wake up and every 2 hours until you get to the Short Stay unit. . If your blood sugar is less than 70 mg/dL, you will need to treat for low blood sugar: o Do not take insulin. o Treat a low blood sugar (less than 70 mg/dL) with  cup of clear juice (cranberry or apple), 4 glucose tablets, OR glucose gel. o Recheck blood sugar in 15 minutes after treatment (to make sure it is greater than 70 mg/dL). If your blood sugar is not greater than 70 mg/dL on recheck, call [phone] for further instructions. . Report your blood sugar to the short stay nurse when you get to Short Stay.  . If you are admitted to the hospital after surgery: o Your blood sugar will be checked by the staff and you will probably be given insulin after surgery (instead of oral diabetes medicines) to make sure you have good blood sugar levels. o The goal for blood sugar control after surgery is 80-180 mg/dL.     Do not wear jewelry, make-up or nail polish.  Do not wear lotions, powders, or perfumes, or deodorant.  Do not shave 48 hours prior to surgery.   Do not bring valuables to the hospital.  [facility] is not responsible for any belongings or valuables.  Contacts, eyeglasses, dentures or partials may not be worn into surgery.    Leave your suitcase in the car.  After surgery it may be brought to your room.  For patients admitted to the hospital, discharge time will be determined by your treatment team.  Patients discharged the day of surgery will not be allowed to drive home.     Please read over the following fact sheets that you were given. Pain Booklet, Coughing and Deep Breathing and Surgical Site Infection Prevention

## 2018-09-24 NOTE — Telephone Encounter (Addendum)
Nutrition Assessment  Reason for Assessment:  Pt attended Breast Clinic on 9/2 and received nutrition packet from nurse navigator  ASSESSMENT:  53 year old female with new diagnosis of breast cancer.  Planning mastectomy, radiation and antiestrogens.  Past medical history of HTN, lap roux Y in 2005, vit D deficiency, prediabetes.    Spoke with patient to introduce self and service at [facility].  Patient reports that she has reconnected with [facility].  Medications:  reviewed  Labs: reviewed  Anthropometrics:   Height: 66 inhces Weight: 268 lb  BMI: 43   NUTRITION DIAGNOSIS: Food and nutrition related knowledge deficit related to new diagnosis of breast cancer as evidenced by no prior need for nutrition related information.  INTERVENTION:   Discussed briefly packet of nutrition information regarding breast cancer with patient.   Contact information provided and patient knows to contact me with questions/concerns.    MONITORING, EVALUATION, and GOAL: Pt will consume a healthy plant based diet to maintain lean body mass throughout treatment.    Joanie Duprey B. Zenia Resides, [place], [place] Registered Dietitian [phone] (pager)

## 2018-09-25 ENCOUNTER — Other Ambulatory Visit: Payer: Self-pay

## 2018-09-25 ENCOUNTER — Encounter (HOSPITAL_COMMUNITY)
Admission: RE | Admit: 2018-09-25 | Discharge: 2018-09-25 | Disposition: A | Discharge status: Home / Self Care | Payer: BC Managed Care – PPO | Source: Ambulatory Visit | Attending: General Surgery | Admitting: General Surgery

## 2018-09-25 ENCOUNTER — Encounter (HOSPITAL_COMMUNITY): Payer: Self-pay | Admitting: *Deleted

## 2018-09-25 DIAGNOSIS — Z9049 Acquired absence of other specified parts of digestive tract: Secondary | ICD-10-CM | POA: Diagnosis not present

## 2018-09-25 DIAGNOSIS — Z170 Estrogen receptor positive status [ER+]: Secondary | ICD-10-CM | POA: Diagnosis not present

## 2018-09-25 DIAGNOSIS — R7303 Prediabetes: Secondary | ICD-10-CM | POA: Diagnosis not present

## 2018-09-25 DIAGNOSIS — Z01812 Encounter for preprocedural laboratory examination: Secondary | ICD-10-CM | POA: Diagnosis not present

## 2018-09-25 DIAGNOSIS — Z9884 Bariatric surgery status: Secondary | ICD-10-CM | POA: Diagnosis not present

## 2018-09-25 DIAGNOSIS — C50812 Malignant neoplasm of overlapping sites of left female breast: Secondary | ICD-10-CM | POA: Diagnosis not present

## 2018-09-25 DIAGNOSIS — I10 Essential (primary) hypertension: Secondary | ICD-10-CM | POA: Diagnosis not present

## 2018-09-25 HISTORY — DX: Other specified postprocedural states: Z98.890

## 2018-09-25 HISTORY — DX: Nausea with vomiting, unspecified: R11.2

## 2018-09-25 HISTORY — DX: Presence of spectacles and contact lenses: Z97.3

## 2018-09-25 HISTORY — DX: Malignant (primary) neoplasm, unspecified: C80.1

## 2018-09-25 HISTORY — DX: Depression, unspecified: F32.A

## 2018-09-25 LAB — GLUCOSE, CAPILLARY: Glucose-Capillary: 110 mg/dL — ABNORMAL HIGH (ref 70–99)

## 2018-09-25 LAB — CBC WITH DIFFERENTIAL/PLATELET
Abs Immature Granulocytes: 0.01 10*3/uL (ref 0.00–0.07)
Basophils Absolute: 0 10*3/uL (ref 0.0–0.1)
Basophils Relative: 1 %
Eosinophils Absolute: 0 10*3/uL (ref 0.0–0.5)
Eosinophils Relative: 1 %
HCT: 40.5 % (ref 36.0–46.0)
Hemoglobin: 13 g/dL (ref 12.0–15.0)
Immature Granulocytes: 0 %
Lymphocytes Relative: 33 %
Lymphs Abs: 1.5 10*3/uL (ref 0.7–4.0)
MCH: 26.7 pg (ref 26.0–34.0)
MCHC: 32.1 g/dL (ref 30.0–36.0)
MCV: 83.3 fL (ref 80.0–100.0)
Monocytes Absolute: 0.5 10*3/uL (ref 0.1–1.0)
Monocytes Relative: 11 %
Neutro Abs: 2.6 10*3/uL (ref 1.7–7.7)
Neutrophils Relative %: 54 %
Platelets: 244 10*3/uL (ref 150–400)
RBC: 4.86 MIL/uL (ref 3.87–5.11)
RDW: 14.6 % (ref 11.5–15.5)
WBC: 4.7 10*3/uL (ref 4.0–10.5)
nRBC: 0 % (ref 0.0–0.2)

## 2018-09-25 LAB — BASIC METABOLIC PANEL
Anion gap: 7 (ref 5–15)
BUN: 15 mg/dL (ref 6–20)
CO2: 28 mmol/L (ref 22–32)
Calcium: 8.8 mg/dL — ABNORMAL LOW (ref 8.9–10.3)
Chloride: 102 mmol/L (ref 98–111)
Creatinine, Ser: 0.92 mg/dL (ref 0.44–1.00)
GFR calc Af Amer: >60 mL/min (ref >60)
GFR calc non Af Amer: >60 mL/min (ref >60)
Glucose, Bld: 112 mg/dL — ABNORMAL HIGH (ref 70–99)
Potassium: 3.5 mmol/L (ref 3.5–5.1)
Sodium: 137 mmol/L (ref 135–145)

## 2018-09-25 NOTE — Progress Notes (Signed)
Pt denies having a stress test, echo and cardiac cath. 

## 2018-09-25 NOTE — Pre-Procedure Instructions (Signed)
Roberta [name]  09/25/2018     [facility] [address] [place], [place] [address] Phone: [phone] Fax: [phone]   Your procedure is scheduled on Tuesday, October 01, 2018  Report to [place] at 5:30 A.M.  Call this number if you have problems the morning of surgery:  [phone]   Remember: Brush your teeth the morning of surgery with your regular toothpaste.  Do not eat after midnight Monday, September 30, 2018  You may drink clear liquids until 4:30A.M. .  Clear liquids allowed are:                    Water, Juice (non-citric and without pulp), Carbonated beverages, Clear Tea, Black Coffee only, Plain Jell-O only, Gatorade and Plain Popsicles only ( all low sugar)    Take these medicines the morning of surgery with A SIP OF WATER : sertraline (ZOLOFT)  Stop taking Aspirin (unless otherwise advised by surgeon), vitamins, fish oil, Biotin and herbal medications. Do not take any NSAIDs ie: Ibuprofen, Advil, Naproxen (Aleve), Motrin, BC and Goody Powder; stop now.    How to Manage Your Diabetes Before and After Surgery  Why is it important to control my blood sugar before and after surgery? . Improving blood sugar levels before and after surgery helps healing and can limit problems. . A way of improving blood sugar control is eating a healthy diet by: o  Eating less sugar and carbohydrates o  Increasing activity/exercise o  Talking with your doctor about reaching your blood sugar goals . High blood sugars (greater than 180 mg/dL) can raise your risk of infections and slow your recovery, so you will need to focus on controlling your diabetes during the weeks before surgery. . Make sure that the doctor who takes care of your diabetes knows about your planned surgery including the date and location.  How do I manage my blood sugar before surgery? . Check your blood sugar at  least 4 times a day, starting 2 days before surgery, to make sure that the level is not too high or low. o Check your blood sugar the morning of your surgery when you wake up and every 2 hours until you get to the Short Stay unit. . If your blood sugar is less than 70 mg/dL, you will need to treat for low blood sugar: o Treat a low blood sugar (less than 70 mg/dL) with  cup of clear juice (cranberry or apple), 4 glucose tablets, OR glucose gel. Recheck blood sugar in 15 minutes after treatment (to make sure it is greater than 70 mg/dL). If your blood sugar is not greater than 70 mg/dL on recheck, call [phone] o  for further instructions. . Report your blood sugar to the short stay nurse when you get to Short Stay.  . If you are admitted to the hospital after surgery: o Your blood sugar will be checked by the staff and you will probably be given insulin after surgery (instead of oral diabetes medicines) to make sure you have good blood sugar levels. o The goal for blood sugar control after surgery is 80-180 mg/dL.  WHAT DO I DO ABOUT MY DIABETES MEDICATION?   Marland Kitchen Do not take oral diabetes medicines (pills) the morning of surgery such as metFORMIN (GLUCOPHAGE)  Reviewed and Endorsed by [facility] Patient Education Committee, August 2015   Do not wear jewelry, make-up or nail  polish.  Do not wear lotions, powders, or perfumes, or deodorant.  Do not shave 48 hours prior to surgery.  Do not bring valuables to the hospital.  [facility] is not responsible for any belongings or valuables.  Contacts, dentures or bridgework may not be worn into surgery.  Leave your suitcase in the car.  After surgery it may be brought to your room. For patients admitted to the hospital, discharge time will be determined by your treatment team.  Patients discharged the day of surgery will not be allowed to drive home.   Special instructions: Shower the night before surgery and morning of Surgery with CHG; see   " [place] Preparing For Surgery "  sheet.  Please read over the following fact sheets that you were given. Pain Booklet, Coughing and Deep Breathing and Surgical Site Infection Prevention

## 2018-09-25 NOTE — Progress Notes (Signed)
Pt denies SOB, chest pain, and being under the care of a cardiologist. Pt stated that she is under the care of Dr. Billey Gosling, PCP. Pt denies having a chest x ray in the last year. Pt denies recent labs. PA, Karoline Caldwell, Anesthesiology, advised that an A1c be drawn for pt (pre-diabetes taking Metformin). Pt verbalized understanding of all pre-op instructions. Urine pregnency test needed DOS.

## 2018-09-26 LAB — HEMOGLOBIN A1C
Hgb A1c MFr Bld: 6 % — ABNORMAL HIGH (ref 4.8–5.6)
Mean Plasma Glucose: 126 mg/dL

## 2018-09-26 NOTE — Progress Notes (Signed)
Office: [phone]  /  Fax: [phone]   HPI:   Chief Complaint: OBESITY Roberta [name] is here to discuss her progress with her obesity treatment plan. She is on the Category 2 plan + 100 calories and is following her eating plan approximately 50 % of the time. She states she is doing some walking. Roberta [name] has returned to our clinic after a month, secondary to being diagnosed with breast cancer 2 separate types. She has a left mastectomy scheduled for 10/01/2018. She has already started prepping food for the post op course.  Her weight is 262 lb (118.8 kg) today and has had a weight loss of 2 pounds over a period of 5-6 weeks since her last visit. She has lost 30 lbs since starting treatment with [place].  Pre-Diabetes Roberta [name] has a diagnosis of pre-diabetes based on her elevated Hgb A1c and was informed this puts her at greater risk of developing diabetes. Last Hgb A1c was of 5.9. She is on metformin 500 mg daily and continues to work on diet and exercise to decrease risk of diabetes. She denies nausea or hypoglycemia.  At risk for diabetes Roberta [name] is at higher than average risk for developing diabetes due to her obesity and pre-diabetes. She currently denies polyuria or polydipsia.  Depression with Anxiety Roberta [name] notes increased feelings of depression with cancer diagnosis. She shows no sign of suicidal or homicidal ideations.  ASSESSMENT AND PLAN:  Prediabetes - Plan: metFORMIN (GLUCOPHAGE) 500 MG tablet  Anxiety and depression - Plan: sertraline (ZOLOFT) 25 MG tablet  At risk for diabetes mellitus  Class 3 severe obesity with serious comorbidity and body mass index (BMI) of 40.0 to 44.9 in adult, unspecified obesity type [facility])  PLAN:  Pre-Diabetes Roberta [name] will continue to work on weight loss, exercise, and decreasing simple carbohydrates in her diet to help decrease the risk of diabetes. We dicussed metformin including benefits and risks. She was informed that eating too many simple  carbohydrates or too many calories at one sitting increases the likelihood of GI side effects. Roberta [name] agrees to continue taking metformin 500 mg PO daily #30 and we will refill for 1 month. Roberta [name] agrees to follow up with our clinic in 2 weeks as directed to monitor her progress.  Diabetes risk counseling Roberta [name] was given extended (15 minutes) diabetes prevention counseling today. She is 53 y.o. female and has risk factors for diabetes including obesity and pre-diabetes. We discussed intensive lifestyle modifications today with an emphasis on weight loss as well as increasing exercise and decreasing simple carbohydrates in her diet.  Depression with Anxiety We discussed behavior modification techniques today to help Roberta [name] deal with her depression and anxiety. Brione agrees to start taking sertraline 25 mg PO q daily #30 with no refills. Chakita agrees to follow up with our clinic in 2 weeks.  Obesity Roberta [name] is currently in the action stage of change. As such, her goal is to continue with weight loss efforts She has agreed to follow the Category 2 plan + 100 calories Roberta [name] has been instructed to work up to a goal of 150 minutes of combined cardio and strengthening exercise per week for weight loss and overall health benefits. We discussed the following Behavioral Modification Strategies today: increasing lean protein intake, increasing vegetables and work on meal planning and easy cooking plans, keeping healthy foods in the home, and planning for success   Roberta [name] has agreed to follow up with our clinic in 2 weeks. She was informed of the importance of frequent  follow up visits to maximize her success with intensive lifestyle modifications for her multiple health conditions.  ALLERGIES: Allergies  Allergen Reactions  . Lisinopril Swelling    Swelling of the tongue  . Penicillins     Pt told as a child-unknown reaction.  . Tetanus Toxoids Swelling    Swelling at site of injection  . Norvasc  [Amlodipine Besylate] Rash  . Red Dye Rash    MEDICATIONS: Current Outpatient Medications on File Prior to Visit  Medication Sig Dispense Refill  . Biotin 10000 MCG TABS Take 10,000 mcg by mouth at bedtime.     . Cholecalciferol (VITAMIN D-3) 5000 units TABS Take 1 tablet by mouth daily. (Patient taking differently: Take 5,000 Units by mouth daily. ) 30 tablet   . losartan-hydrochlorothiazide (HYZAAR) 100-25 MG tablet Take 0.5 tablets by mouth daily. -- Office visit needed for further refills (Patient taking differently: Take 1 tablet by mouth daily. -- Office visit needed for further refills) 1 tablet 0  . Multiple Vitamin (MULTIVITAMIN) tablet Take 1 tablet by mouth daily.    . Omega-3 Fatty Acids (OMEGA 3 500 PO) Take 500 mg by mouth daily.     . vitamin E 100 UNIT capsule Take 180 Units by mouth at bedtime.     Marland Kitchen zinc gluconate 50 MG tablet Take 50 mg by mouth at bedtime.      No current facility-administered medications on file prior to visit.     PAST MEDICAL HISTORY: Past Medical History:  Diagnosis Date  . Back pain   . Cancer [facility])    multifocal left breast  . Depression   . Hip pain   . Hypertension   . Obesity   . PONV (postoperative nausea and vomiting)   . Prediabetes   . Swelling    bilat LE  . Wears glasses     PAST SURGICAL HISTORY: Past Surgical History:  Procedure Laterality Date  . CHOLECYSTECTOMY    . COLONOSCOPY    . DILATION AND CURETTAGE OF UTERUS    . GASTRIC BYPASS    . KNEE SURGERY  2002   Ligament repair    SOCIAL HISTORY: Social History   Tobacco Use  . Smoking status: Never Smoker  . Smokeless tobacco: Never Used  Substance Use Topics  . Alcohol use: Yes    Comment: occasional wine  . Drug use: No    FAMILY HISTORY: Family History  Problem Relation Age of Onset  . Diabetes Mother   . Hypertension Mother   . Heart disease Mother   . Stroke Mother   . Kidney disease Mother   . Obesity Mother   . Cancer Maternal Aunt         lung  . Cancer Maternal Uncle        lung  . Throat cancer Maternal Aunt     ROS: Review of Systems  Constitutional: Positive for weight loss.  Gastrointestinal: Negative for nausea.  Genitourinary: Negative for frequency.  Endo/Heme/Allergies: Negative for polydipsia.       Negative hypoglycemia  Psychiatric/Behavioral: Positive for depression. Negative for suicidal ideas.       + Anxiety    PHYSICAL EXAM: Blood pressure 117/77, pulse 75, temperature 97.9 F (36.6 C), temperature source Oral, height 5\' 6"  (1.676 m), weight 262 lb (118.8 kg), last menstrual period 09/04/2018, SpO2 99 %. Body mass index is 42.29 kg/m. Physical Exam Vitals signs reviewed.  Constitutional:      Appearance: Normal appearance. She is obese.  Cardiovascular:     Rate and Rhythm: Normal rate.     Pulses: Normal pulses.  Pulmonary:     Effort: Pulmonary effort is normal.     Breath sounds: Normal breath sounds.  Musculoskeletal: Normal range of motion.  Skin:    General: Skin is warm and dry.  Neurological:     Mental Status: She is alert and oriented to person, place, and time.  Psychiatric:        Mood and Affect: Mood normal.        Behavior: Behavior normal.     RECENT LABS AND TESTS: BMET    Component Value Date/Time   NA 137 09/25/2018 0904   NA 139 07/17/2018 1547   K 3.5 09/25/2018 0904   CL 102 09/25/2018 0904   CO2 28 09/25/2018 0904   GLUCOSE 112 (H) 09/25/2018 0904   BUN 15 09/25/2018 0904   BUN 16 07/17/2018 1547   CREATININE 0.92 09/25/2018 0904   CREATININE 0.95 09/04/2018 0840   CALCIUM 8.8 (L) 09/25/2018 0904   GFRNONAA >60 09/25/2018 0904   GFRNONAA >60 09/04/2018 0840   GFRAA >60 09/25/2018 0904   GFRAA >60 09/04/2018 0840   Lab Results  Component Value Date   HGBA1C 6.0 (H) 09/25/2018   HGBA1C 5.9 (H) 07/17/2018   HGBA1C 6.1 (H) 01/31/2018   HGBA1C 6.0 (H) 10/08/2017   HGBA1C 6.3 06/25/2017   Lab Results  Component Value Date   INSULIN 13.4  01/31/2018   INSULIN 10.6 10/08/2017   CBC    Component Value Date/Time   WBC 4.7 09/25/2018 0904   RBC 4.86 09/25/2018 0904   HGB 13.0 09/25/2018 0904   HGB 11.8 (L) 09/04/2018 0840   HGB 12.3 07/17/2018 1547   HCT 40.5 09/25/2018 0904   HCT 38.5 07/17/2018 1547   PLT 244 09/25/2018 0904   PLT 260 09/04/2018 0840   MCV 83.3 09/25/2018 0904   MCV 83 07/17/2018 1547   MCH 26.7 09/25/2018 0904   MCHC 32.1 09/25/2018 0904   RDW 14.6 09/25/2018 0904   RDW 14.9 07/17/2018 1547   LYMPHSABS 1.5 09/25/2018 0904   LYMPHSABS 1.4 07/17/2018 1547   MONOABS 0.5 09/25/2018 0904   EOSABS 0.0 09/25/2018 0904   EOSABS 0.1 07/17/2018 1547   BASOSABS 0.0 09/25/2018 0904   BASOSABS 0.0 07/17/2018 1547   Iron/TIBC/Ferritin/ %Sat    Component Value Date/Time   IRON 162 (H) 01/22/2017 1152   FERRITIN 14.5 01/22/2017 1152   Lipid Panel     Component Value Date/Time   CHOL 112 10/08/2017 1239   TRIG 69 10/08/2017 1239   HDL 50 10/08/2017 1239   CHOLHDL 2 06/25/2017 1541   VLDL 15.0 06/25/2017 1541   LDLCALC 48 10/08/2017 1239   Hepatic Function Panel     Component Value Date/Time   PROT 6.9 09/04/2018 0840   PROT 6.9 07/17/2018 1547   ALBUMIN 3.0 (L) 09/04/2018 0840   ALBUMIN 3.6 (L) 07/17/2018 1547   AST 13 (L) 09/04/2018 0840   ALT 10 09/04/2018 0840   ALKPHOS 65 09/04/2018 0840   BILITOT 0.4 09/04/2018 0840      Component Value Date/Time   TSH 1.480 10/08/2017 1239   TSH 1.24 06/25/2017 1541   TSH 1.53 07/11/2016 1150      OBESITY BEHAVIORAL INTERVENTION VISIT  Today's visit was # 22   Starting weight: 292 lbs Starting date: 10/08/17 Today's weight : 262 lbs  Today's date: 09/24/2018 Total lbs lost to date: 52  ASK: We discussed the diagnosis of obesity with Waldemar Dickens today and Maddalyn agreed to give [place] permission to discuss obesity behavioral modification therapy today.  ASSESS: Katee has the diagnosis of obesity and her BMI today is 42.31 Torii  is in the action stage of change   ADVISE: Skyelar was educated on the multiple health risks of obesity as well as the benefit of weight loss to improve her health. She was advised of the need for long term treatment and the importance of lifestyle modifications to improve her current health and to decrease her risk of future health problems.  AGREE: Multiple dietary modification options and treatment options were discussed and  Maison agreed to follow the recommendations documented in the above note.  ARRANGE: Daanya was educated on the importance of frequent visits to treat obesity as outlined per CMS and USPSTF guidelines and agreed to schedule her next follow up appointment today.  I, Trixie Dredge, am acting as transcriptionist for Ilene Qua, MD  I have reviewed the above documentation for accuracy and completeness, and I agree with the above. - Ilene Qua, MD

## 2018-09-27 ENCOUNTER — Encounter: Payer: Self-pay | Admitting: *Deleted

## 2018-09-27 ENCOUNTER — Other Ambulatory Visit (HOSPITAL_COMMUNITY)
Admission: RE | Admit: 2018-09-27 | Discharge: 2018-09-27 | Disposition: A | Discharge status: Home / Self Care | Payer: BC Managed Care – PPO | Source: Ambulatory Visit | Attending: General Surgery | Admitting: General Surgery

## 2018-09-27 DIAGNOSIS — Z01812 Encounter for preprocedural laboratory examination: Secondary | ICD-10-CM | POA: Insufficient documentation

## 2018-09-27 DIAGNOSIS — Z20828 Contact with and (suspected) exposure to other viral communicable diseases: Secondary | ICD-10-CM | POA: Insufficient documentation

## 2018-09-28 LAB — NOVEL CORONAVIRUS, NAA (HOSP ORDER, SEND-OUT TO REF LAB; TAT 18-24 HRS): SARS-CoV-2, NAA: NOT DETECTED

## 2018-09-29 NOTE — H&P (Signed)
Roberta [name] Location: [facility] Patient #: [id number] DOB: 06/30/1965 Divorced / Language: English / Race: Black or African American Female      History of Present Illness      .This is a pleasant 53 year old female who returns for a second preop visit and planning of definitive surgery for her multifocal left breast cancer. Dr. Jana Hakim and Dr. Lisbeth Renshaw are involved. Billey Gosling is her PCP. Dr.Banga is her gynecologist . Livia Snellen was my chaperone throughout the encounter.       Screening mammograms on August 01, 2018 were actually normal. But her gynecologist socially felt a lump in the left breast. Further imaging studies showed 2 findings in the left breast. At the 10 o'clock position just outside the areolar margin, upper inner there is a 2.2 cm mass which was biopsied and showed grade 2 invasive ductal carcinoma, ER 100%, PR 2%, HER-2 negative, Ki-67 10% Secondly there was an 8 mm area of calcifications in the left breast, upper outer which is biopsied and showed high-grade DCIS, ER 95%, PR 100% because of these areas are anterior the clips are said to be 4.7 cm apart Because of her multicentric disease we advised left mastectomy and sentinel node biopsy and she was comfortable with that. She was interested in reconstruction. Her breasts are large and ptotic and so she is not good candidate for nipple sparing mastectomy. She has seen Dr. Iran Planas. The patient does not want implants. She wants autologous tissue transfer. She wants DIEP. She was advised against that as an immediate reconstruction due to her high BMI. She was told she would need to lose weight. She wants to go ahead with mastectomy and sentinel node without reconstruction.       Oncotype score 20 with 6% recurrence rate so she will not need chemotherapy. This was done on the core biopsy She has chronic left shoulder disability and is been receiving preop physical therapy for that. I told her that I'm  retiring in January and she wanted me to go ahead with her surgery. She will be assigned to another breast cancer surgeon in January but I can supervise her healing just fine      Past history significant for gastric bypass by Dr. Truitt Leep. She lost 100 pounds but still has BMI of 43. Cholecystectomy by Dr. Nedra Hai. Hypertension. Prediabetes Family history reveals father died age 80. Mother had diabetes and hypertension stroke and heart disease. An aunt and uncle both had lung cancer. No family history of breast or ovarian cancer Social history reveals she is divorced. No children. 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 Drinks about 3 alcoholic beverages a week. Denies tobacco.      We had a lengthy discussion almost 45 minutes today. She wants me to go ahead and schedule her for injection blue dye left breast, left total mastectomy, left axillary deep sentinel lymph node biopsy I discussed the indications, details, techniques, and he was risk of the surgery with her. She is aware the risks of bleeding, infection, reoperation for complications, arm swelling, arm numbness, shoulder disability being exacerbated. She understands all these issues. All of her questions are answered. She agrees with this plan. She will be scheduled in the near future Continue preop and postop physical therapy     Allergies  Lisinopril *ANTIHYPERTENSIVES*  Swelling. Swelling of tongue Norvasc *CALCIUM CHANNEL BLOCKERS*  Rash. Penicillins  Unknown reaction, told as a child Tetanus Toxoids  Swelling at  site of injection Allergies Reconciled   Medication History  Biotin (1000MCG Tablet Chewable, Oral) Active. Cholecalciferol (125 MCG(5000 UT) Capsule, Oral) Active. Multiple Vitamin (1 (one) Oral) Active. metFORMIN HCl (500MG Tablet, Oral) Active. Omega 3 (1000MG Capsule, Oral) Active. Zinc Gluconate (50MG Tablet, Oral) Active. Losartan Potassium (25MG  Tablet, Oral) Active. Medications Reconciled  Vitals  Weight: 269 lb Height: 66in Body Surface Area: 2.27 m Body Mass Index: 43.42 kg/m  Temp.: 98.30F  Pulse: 101 (Regular)  P.OX: 99% (Room air) BP: 134/82(Sitting, Left Arm, Standard)    Physical Exam General Mental Status-Alert. General Appearance-Not in acute distress. Build & Nutrition-Well nourished. Posture-Normal posture. Gait-Normal. Note: BMI is 43.42   Head and Neck Head-normocephalic, atraumatic with no lesions or palpable masses. Trachea-midline. Thyroid Gland Characteristics - normal size and consistency and no palpable nodules.  Chest and Lung Exam Chest and lung exam reveals -on auscultation, normal breath sounds, no adventitious sounds and normal vocal resonance.  Breast Note: Breasts are symmetrical, large and ptotic. There is a 3 cm palpable mass in the left breast at the 10 o'clock position just outside the areolar margin. Quite mobile. Seems relatively superficial but no skin changes. No other masses or skin changes in either breast. No palpable axillary or supraclavicular adenopathy.   Cardiovascular Cardiovascular examination reveals -normal heart sounds, regular rate and rhythm with no murmurs and femoral artery auscultation bilaterally reveals normal pulses, no bruits, no thrills.  Abdomen Inspection Inspection of the abdomen reveals - No Hernias. Palpation/Percussion Palpation and Percussion of the abdomen reveal - Soft, Non Tender, No Rigidity (guarding), No hepatosplenomegaly and No Palpable abdominal masses.  Neurologic Neurologic evaluation reveals -alert and oriented x 3 with no impairment of recent or remote memory, normal attention span and ability to concentrate, normal sensation and normal coordination.  Musculoskeletal Normal Exam - Bilateral-Upper Extremity Strength Normal and Lower Extremity Strength Normal.    Assessment & Plan   MALIGNANT  NEOPLASM OF OVERLAPPING SITES OF LEFT BREAST IN FEMALE, ESTROGEN RECEPTOR POSITIVE (C50.812)  You have returned today for a final discussion and planning for you definitive surgery for your left breast cancer As previously discussed, you have multifocal cancer in the left breast you will need to have a left total mastectomy and left axillary sentinel lymph node mapping and biopsy We have discussed the indications, techniques, and risk of this surgery in detail  You have seen plastic surgery. You have stated that she would like an autologous tissue transfer, but no implants. You are aware that you will have to lose weight before you become a candidate for that  As a result were going to proceed with a left mastectomy and sentinel lymph node biopsy without immediate reconstruction We will schedule the surgery in the near future You have been given a prescription for most postmastectomy supplies. you may take that to [facility] in [facility] for garments and prostheses.  You have been informed that Dr. Dalbert Batman is going to retire in January and he will assign and transfer your care to one of the other surgeons who specializes in breast cancer. 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 Dalbert Batman, M.D., [facility] General and Minimally invasive Surgery Breast and Colorectal Surgery Office:  [phone] Pager:   [phone]

## 2018-09-30 MED ORDER — DEXTROSE 5 % IV SOLN
3.0000 g | INTRAVENOUS | Status: AC
  Administered 2018-10-01: 3 g via INTRAVENOUS
  Filled 2018-09-30: qty 3

## 2018-10-01 ENCOUNTER — Ambulatory Visit (HOSPITAL_COMMUNITY): Payer: BC Managed Care – PPO | Admitting: Certified Registered"

## 2018-10-01 ENCOUNTER — Encounter (HOSPITAL_COMMUNITY): Payer: Self-pay

## 2018-10-01 ENCOUNTER — Encounter (HOSPITAL_COMMUNITY)
Admission: RE | Disposition: A | Discharge status: Home / Self Care | Payer: Self-pay | Source: Home / Self Care | Attending: General Surgery

## 2018-10-01 ENCOUNTER — Observation Stay (HOSPITAL_COMMUNITY)
Admission: RE | Admit: 2018-10-01 | Discharge: 2018-10-02 | Disposition: A | Discharge status: Home / Self Care | Payer: BC Managed Care – PPO | Attending: General Surgery | Admitting: General Surgery

## 2018-10-01 ENCOUNTER — Other Ambulatory Visit: Payer: Self-pay

## 2018-10-01 ENCOUNTER — Ambulatory Visit (HOSPITAL_COMMUNITY)
Admission: RE | Admit: 2018-10-01 | Discharge: 2018-10-01 | Disposition: A | Discharge status: Home / Self Care | Payer: BC Managed Care – PPO | Source: Ambulatory Visit | Attending: General Surgery | Admitting: General Surgery

## 2018-10-01 DIAGNOSIS — Z9884 Bariatric surgery status: Secondary | ICD-10-CM | POA: Diagnosis not present

## 2018-10-01 DIAGNOSIS — Z888 Allergy status to other drugs, medicaments and biological substances status: Secondary | ICD-10-CM | POA: Insufficient documentation

## 2018-10-01 DIAGNOSIS — Z79899 Other long term (current) drug therapy: Secondary | ICD-10-CM | POA: Diagnosis not present

## 2018-10-01 DIAGNOSIS — Z887 Allergy status to serum and vaccine status: Secondary | ICD-10-CM | POA: Insufficient documentation

## 2018-10-01 DIAGNOSIS — C50812 Malignant neoplasm of overlapping sites of left female breast: Secondary | ICD-10-CM | POA: Diagnosis not present

## 2018-10-01 DIAGNOSIS — Z880 Allergy status to penicillin: Secondary | ICD-10-CM | POA: Insufficient documentation

## 2018-10-01 DIAGNOSIS — Z170 Estrogen receptor positive status [ER+]: Secondary | ICD-10-CM | POA: Diagnosis not present

## 2018-10-01 DIAGNOSIS — C50212 Malignant neoplasm of upper-inner quadrant of left female breast: Secondary | ICD-10-CM

## 2018-10-01 DIAGNOSIS — R7303 Prediabetes: Secondary | ICD-10-CM | POA: Diagnosis present

## 2018-10-01 DIAGNOSIS — Z9102 Food additives allergy status: Secondary | ICD-10-CM | POA: Diagnosis not present

## 2018-10-01 DIAGNOSIS — M7502 Adhesive capsulitis of left shoulder: Secondary | ICD-10-CM | POA: Diagnosis not present

## 2018-10-01 DIAGNOSIS — Z9049 Acquired absence of other specified parts of digestive tract: Secondary | ICD-10-CM | POA: Diagnosis not present

## 2018-10-01 DIAGNOSIS — Z6841 Body Mass Index (BMI) 40.0 and over, adult: Secondary | ICD-10-CM | POA: Diagnosis not present

## 2018-10-01 DIAGNOSIS — Z7984 Long term (current) use of oral hypoglycemic drugs: Secondary | ICD-10-CM | POA: Diagnosis not present

## 2018-10-01 DIAGNOSIS — I10 Essential (primary) hypertension: Secondary | ICD-10-CM | POA: Insufficient documentation

## 2018-10-01 DIAGNOSIS — Z8249 Family history of ischemic heart disease and other diseases of the circulatory system: Secondary | ICD-10-CM | POA: Diagnosis not present

## 2018-10-01 DIAGNOSIS — E119 Type 2 diabetes mellitus without complications: Secondary | ICD-10-CM | POA: Insufficient documentation

## 2018-10-01 DIAGNOSIS — Z833 Family history of diabetes mellitus: Secondary | ICD-10-CM | POA: Diagnosis not present

## 2018-10-01 HISTORY — PX: MASTECTOMY W/ SENTINEL NODE BIOPSY: SHX2001

## 2018-10-01 LAB — CREATININE, SERUM
Creatinine, Ser: 1.03 mg/dL — ABNORMAL HIGH (ref 0.44–1.00)
GFR calc Af Amer: >60 mL/min (ref >60)
GFR calc non Af Amer: >60 mL/min (ref >60)

## 2018-10-01 LAB — GLUCOSE, CAPILLARY
Glucose-Capillary: 95 mg/dL (ref 70–99)
Glucose-Capillary: 152 mg/dL — ABNORMAL HIGH (ref 70–99)
Glucose-Capillary: 136 mg/dL — ABNORMAL HIGH (ref 70–99)
Glucose-Capillary: 136 mg/dL — ABNORMAL HIGH (ref 70–99)
Glucose-Capillary: 125 mg/dL — ABNORMAL HIGH (ref 70–99)

## 2018-10-01 LAB — CBC
HCT: 36.9 % (ref 36.0–46.0)
Hemoglobin: 12.2 g/dL (ref 12.0–15.0)
MCH: 26.9 pg (ref 26.0–34.0)
MCHC: 33.1 g/dL (ref 30.0–36.0)
MCV: 81.5 fL (ref 80.0–100.0)
Platelets: 222 10*3/uL (ref 150–400)
RBC: 4.53 MIL/uL (ref 3.87–5.11)
RDW: 14.4 % (ref 11.5–15.5)
WBC: 12.1 10*3/uL — ABNORMAL HIGH (ref 4.0–10.5)
nRBC: 0 % (ref 0.0–0.2)

## 2018-10-01 LAB — POCT PREGNANCY, URINE: Preg Test, Ur: NEGATIVE

## 2018-10-01 SURGERY — MASTECTOMY WITH SENTINEL LYMPH NODE BIOPSY
Anesthesia: Regional | Site: Breast | Laterality: Left

## 2018-10-01 MED ORDER — HYDROCODONE-ACETAMINOPHEN 5-325 MG PO TABS
1.0000 | ORAL_TABLET | ORAL | Status: DC | PRN
  Administered 2018-10-01 – 2018-10-02 (×3): 2 via ORAL
  Administered 2018-10-02: 1 via ORAL
  Filled 2018-10-01: qty 1
  Filled 2018-10-01 (×3): qty 2

## 2018-10-01 MED ORDER — EPHEDRINE 5 MG/ML INJ
INTRAVENOUS | Status: AC
  Filled 2018-10-01: qty 10

## 2018-10-01 MED ORDER — ROCURONIUM BROMIDE 50 MG/5ML IV SOSY
PREFILLED_SYRINGE | INTRAVENOUS | Status: DC | PRN
  Administered 2018-10-01: 60 mg via INTRAVENOUS

## 2018-10-01 MED ORDER — VITAMIN E 45 MG (100 UNIT) PO CAPS: 180.0000 [IU] | ORAL_CAPSULE | Freq: Every day | ORAL | Status: DC

## 2018-10-01 MED ORDER — SENNA 8.6 MG PO TABS
1.0000 | ORAL_TABLET | Freq: Two times a day (BID) | ORAL | Status: DC
  Administered 2018-10-01 – 2018-10-02 (×2): 8.6 mg via ORAL
  Filled 2018-10-01 (×2): qty 1

## 2018-10-01 MED ORDER — ONDANSETRON HCL 4 MG/2ML IJ SOLN
INTRAMUSCULAR | Status: AC
  Filled 2018-10-01: qty 2

## 2018-10-01 MED ORDER — SODIUM CHLORIDE (PF) 0.9 % IJ SOLN
INTRAVENOUS | Status: DC | PRN
  Administered 2018-10-01: 5 mL

## 2018-10-01 MED ORDER — SODIUM CHLORIDE 0.9 % IV SOLN
3.0000 g | Freq: Four times a day (QID) | INTRAVENOUS | Status: AC
  Administered 2018-10-01: 3 g via INTRAVENOUS
  Filled 2018-10-01: qty 3

## 2018-10-01 MED ORDER — ACETAMINOPHEN 500 MG PO TABS
ORAL_TABLET | ORAL | Status: AC
  Administered 2018-10-01: 1000 mg via ORAL
  Filled 2018-10-01: qty 2

## 2018-10-01 MED ORDER — METFORMIN HCL 500 MG PO TABS: 500.0000 mg | ORAL_TABLET | Freq: Every day | ORAL | Status: DC

## 2018-10-01 MED ORDER — METHOCARBAMOL 500 MG PO TABS
500.0000 mg | ORAL_TABLET | Freq: Four times a day (QID) | ORAL | Status: DC | PRN
  Administered 2018-10-02: 500 mg via ORAL
  Filled 2018-10-01: qty 1

## 2018-10-01 MED ORDER — CHLORHEXIDINE GLUCONATE CLOTH 2 % EX PADS: 6.0000 | MEDICATED_PAD | Freq: Once | CUTANEOUS | Status: DC

## 2018-10-01 MED ORDER — DEXAMETHASONE SODIUM PHOSPHATE 10 MG/ML IJ SOLN
INTRAMUSCULAR | Status: AC
  Filled 2018-10-01: qty 1

## 2018-10-01 MED ORDER — PHENYLEPHRINE 40 MCG/ML (10ML) SYRINGE FOR IV PUSH (FOR BLOOD PRESSURE SUPPORT)
PREFILLED_SYRINGE | INTRAVENOUS | Status: DC | PRN
  Administered 2018-10-01: 40 ug via INTRAVENOUS

## 2018-10-01 MED ORDER — BIOTIN 10000 MCG PO TABS: 10000.0000 ug | ORAL_TABLET | Freq: Every day | ORAL | Status: DC

## 2018-10-01 MED ORDER — INSULIN ASPART 100 UNIT/ML ~~LOC~~ SOLN: 0.0000 [IU] | Freq: Three times a day (TID) | SUBCUTANEOUS | Status: DC

## 2018-10-01 MED ORDER — LIDOCAINE 2% (20 MG/ML) 5 ML SYRINGE
INTRAMUSCULAR | Status: AC
  Filled 2018-10-01: qty 5

## 2018-10-01 MED ORDER — LOSARTAN POTASSIUM-HCTZ 100-25 MG PO TABS: 1.0000 | ORAL_TABLET | Freq: Every day | ORAL | Status: DC

## 2018-10-01 MED ORDER — PROPOFOL 10 MG/ML IV BOLUS
INTRAVENOUS | Status: DC | PRN
  Administered 2018-10-01: 200 mg via INTRAVENOUS

## 2018-10-01 MED ORDER — OXYCODONE HCL 5 MG/5ML PO SOLN: 5.0000 mg | Freq: Once | ORAL | Status: DC | PRN

## 2018-10-01 MED ORDER — EPHEDRINE SULFATE-NACL 50-0.9 MG/10ML-% IV SOSY
PREFILLED_SYRINGE | INTRAVENOUS | Status: DC | PRN
  Administered 2018-10-01 (×3): 5 mg via INTRAVENOUS

## 2018-10-01 MED ORDER — ONDANSETRON 4 MG PO TBDP: 4.0000 mg | ORAL_TABLET | Freq: Four times a day (QID) | ORAL | Status: DC | PRN

## 2018-10-01 MED ORDER — FENTANYL CITRATE (PF) 250 MCG/5ML IJ SOLN
INTRAMUSCULAR | Status: AC
  Filled 2018-10-01: qty 5

## 2018-10-01 MED ORDER — LACTATED RINGERS IV SOLN
INTRAVENOUS | Status: DC | PRN
  Administered 2018-10-01 (×2): via INTRAVENOUS

## 2018-10-01 MED ORDER — MIDAZOLAM HCL 5 MG/5ML IJ SOLN
INTRAMUSCULAR | Status: DC | PRN
  Administered 2018-10-01: 2 mg via INTRAVENOUS

## 2018-10-01 MED ORDER — GABAPENTIN 300 MG PO CAPS
ORAL_CAPSULE | ORAL | Status: AC
  Filled 2018-10-01: qty 1

## 2018-10-01 MED ORDER — LOSARTAN POTASSIUM 50 MG PO TABS
100.0000 mg | ORAL_TABLET | Freq: Every day | ORAL | Status: DC
  Administered 2018-10-02: 100 mg via ORAL
  Filled 2018-10-01: qty 2

## 2018-10-01 MED ORDER — TRAMADOL HCL 50 MG PO TABS: 50.0000 mg | ORAL_TABLET | Freq: Four times a day (QID) | ORAL | Status: DC | PRN

## 2018-10-01 MED ORDER — VITAMIN D 25 MCG (1000 UNIT) PO TABS
5000.0000 [IU] | ORAL_TABLET | Freq: Every day | ORAL | Status: DC
  Administered 2018-10-02: 5000 [IU] via ORAL
  Filled 2018-10-01: qty 5

## 2018-10-01 MED ORDER — SODIUM CHLORIDE 0.9 % IV SOLN
INTRAVENOUS | Status: DC | PRN
  Administered 2018-10-01: 60 ug/min via INTRAVENOUS

## 2018-10-01 MED ORDER — SODIUM CHLORIDE (PF) 0.9 % IJ SOLN
INTRAMUSCULAR | Status: AC
  Filled 2018-10-01: qty 10

## 2018-10-01 MED ORDER — HYDROCHLOROTHIAZIDE 25 MG PO TABS
25.0000 mg | ORAL_TABLET | Freq: Every day | ORAL | Status: DC
  Administered 2018-10-02: 25 mg via ORAL
  Filled 2018-10-01: qty 1

## 2018-10-01 MED ORDER — ONDANSETRON HCL 4 MG/2ML IJ SOLN
INTRAMUSCULAR | Status: DC | PRN
  Administered 2018-10-01: 4 mg via INTRAVENOUS

## 2018-10-01 MED ORDER — LIDOCAINE 2% (20 MG/ML) 5 ML SYRINGE
INTRAMUSCULAR | Status: DC | PRN
  Administered 2018-10-01: 40 mg via INTRAVENOUS

## 2018-10-01 MED ORDER — METHYLENE BLUE 0.5 % INJ SOLN
INTRAVENOUS | Status: AC
  Filled 2018-10-01: qty 10

## 2018-10-01 MED ORDER — ZINC GLUCONATE 50 MG PO TABS: 50.0000 mg | ORAL_TABLET | Freq: Every day | ORAL | Status: DC

## 2018-10-01 MED ORDER — ENOXAPARIN SODIUM 40 MG/0.4ML ~~LOC~~ SOLN
40.0000 mg | SUBCUTANEOUS | Status: DC
  Administered 2018-10-02: 40 mg via SUBCUTANEOUS
  Filled 2018-10-01: qty 0.4

## 2018-10-01 MED ORDER — CELECOXIB 200 MG PO CAPS
ORAL_CAPSULE | ORAL | Status: AC
  Administered 2018-10-01: 200 mg via ORAL
  Filled 2018-10-01: qty 1

## 2018-10-01 MED ORDER — FENTANYL CITRATE (PF) 100 MCG/2ML IJ SOLN
INTRAMUSCULAR | Status: AC
  Filled 2018-10-01: qty 2

## 2018-10-01 MED ORDER — LACTATED RINGERS IV SOLN
INTRAVENOUS | Status: DC
  Administered 2018-10-01: 13:00:00 via INTRAVENOUS

## 2018-10-01 MED ORDER — SERTRALINE HCL 50 MG PO TABS: 25.0000 mg | ORAL_TABLET | Freq: Every day | ORAL | Status: DC

## 2018-10-01 MED ORDER — ONDANSETRON HCL 4 MG/2ML IJ SOLN: 4.0000 mg | Freq: Four times a day (QID) | INTRAMUSCULAR | Status: DC | PRN

## 2018-10-01 MED ORDER — MIDAZOLAM HCL 2 MG/2ML IJ SOLN
INTRAMUSCULAR | Status: AC
  Filled 2018-10-01: qty 2

## 2018-10-01 MED ORDER — CELECOXIB 200 MG PO CAPS
200.0000 mg | ORAL_CAPSULE | ORAL | Status: AC
  Administered 2018-10-01: 06:00:00 200 mg via ORAL

## 2018-10-01 MED ORDER — TECHNETIUM TC 99M SULFUR COLLOID FILTERED
1.0000 | Freq: Once | INTRAVENOUS | Status: AC | PRN
  Administered 2018-10-01: 1 via INTRADERMAL

## 2018-10-01 MED ORDER — DEXAMETHASONE SODIUM PHOSPHATE 10 MG/ML IJ SOLN
INTRAMUSCULAR | Status: DC | PRN
  Administered 2018-10-01: 10 mg via INTRAVENOUS

## 2018-10-01 MED ORDER — ROPIVACAINE HCL 5 MG/ML IJ SOLN
INTRAMUSCULAR | Status: DC | PRN
  Administered 2018-10-01: 30 mL via PERINEURAL

## 2018-10-01 MED ORDER — FENTANYL CITRATE (PF) 100 MCG/2ML IJ SOLN
INTRAMUSCULAR | Status: DC | PRN
  Administered 2018-10-01 (×5): 50 ug via INTRAVENOUS

## 2018-10-01 MED ORDER — OXYCODONE HCL 5 MG PO TABS: 5.0000 mg | ORAL_TABLET | Freq: Once | ORAL | Status: DC | PRN

## 2018-10-01 MED ORDER — SERTRALINE HCL 50 MG PO TABS
25.0000 mg | ORAL_TABLET | Freq: Every day | ORAL | Status: DC
  Administered 2018-10-01: 25 mg via ORAL
  Filled 2018-10-01: qty 1

## 2018-10-01 MED ORDER — ROCURONIUM BROMIDE 10 MG/ML (PF) SYRINGE
PREFILLED_SYRINGE | INTRAVENOUS | Status: AC
  Filled 2018-10-01: qty 10

## 2018-10-01 MED ORDER — 0.9 % SODIUM CHLORIDE (POUR BTL) OPTIME
TOPICAL | Status: DC | PRN
  Administered 2018-10-01: 1000 mL

## 2018-10-01 MED ORDER — ACETAMINOPHEN 500 MG PO TABS
1000.0000 mg | ORAL_TABLET | ORAL | Status: AC
  Administered 2018-10-01: 06:00:00 1000 mg via ORAL

## 2018-10-01 MED ORDER — SUGAMMADEX SODIUM 200 MG/2ML IV SOLN
INTRAVENOUS | Status: DC | PRN
  Administered 2018-10-01: 300 mg via INTRAVENOUS

## 2018-10-01 MED ORDER — FENTANYL CITRATE (PF) 100 MCG/2ML IJ SOLN
25.0000 ug | INTRAMUSCULAR | Status: DC | PRN
  Administered 2018-10-01: 50 ug via INTRAVENOUS

## 2018-10-01 MED ORDER — ONDANSETRON HCL 4 MG/2ML IJ SOLN: 4.0000 mg | Freq: Once | INTRAMUSCULAR | Status: DC | PRN

## 2018-10-01 MED ORDER — PHENYLEPHRINE 40 MCG/ML (10ML) SYRINGE FOR IV PUSH (FOR BLOOD PRESSURE SUPPORT)
PREFILLED_SYRINGE | INTRAVENOUS | Status: AC
  Filled 2018-10-01: qty 10

## 2018-10-01 MED ORDER — PROPOFOL 10 MG/ML IV BOLUS
INTRAVENOUS | Status: AC
  Filled 2018-10-01: qty 20

## 2018-10-01 MED ORDER — HYDROMORPHONE HCL 1 MG/ML IJ SOLN: 1.0000 mg | INTRAMUSCULAR | Status: DC | PRN

## 2018-10-01 MED ORDER — GABAPENTIN 300 MG PO CAPS: 300.0000 mg | ORAL_CAPSULE | ORAL | Status: DC

## 2018-10-01 SURGICAL SUPPLY — 58 items
APPLIER CLIP 9.375 MED OPEN (MISCELLANEOUS) ×2
BINDER BREAST LRG (GAUZE/BANDAGES/DRESSINGS) IMPLANT
BINDER BREAST XLRG (GAUZE/BANDAGES/DRESSINGS) IMPLANT
BINDER BREAST XXLRG (GAUZE/BANDAGES/DRESSINGS) ×2 IMPLANT
BIOPATCH RED 1 DISK 7.0 (GAUZE/BANDAGES/DRESSINGS) ×4 IMPLANT
CANISTER SUCT 3000ML PPV (MISCELLANEOUS) ×2 IMPLANT
CHLORAPREP W/TINT 26 (MISCELLANEOUS) ×2 IMPLANT
CLIP APPLIE 9.375 MED OPEN (MISCELLANEOUS) ×1 IMPLANT
CONT SPEC 4OZ CLIKSEAL STRL BL (MISCELLANEOUS) ×2 IMPLANT
COVER PROBE W GEL 5X96 (DRAPES) ×2 IMPLANT
COVER SURGICAL LIGHT HANDLE (MISCELLANEOUS) ×2 IMPLANT
COVER WAND RF STERILE (DRAPES) ×2 IMPLANT
DERMABOND ADVANCED (GAUZE/BANDAGES/DRESSINGS) ×1
DERMABOND ADVANCED .7 DNX12 (GAUZE/BANDAGES/DRESSINGS) ×1 IMPLANT
DEVICE DISSECT PLASMABLAD 3.0S (MISCELLANEOUS) IMPLANT
DRAIN CHANNEL 19F RND (DRAIN) ×2 IMPLANT
DRAPE CHEST BREAST 15X10 FENES (DRAPES) ×2 IMPLANT
DRAPE HALF SHEET 40X57 (DRAPES) ×2 IMPLANT
DRAPE ORTHO SPLIT 77X108 STRL (DRAPES)
DRAPE SURG ORHT 6 SPLT 77X108 (DRAPES) IMPLANT
DRSG TEGADERM 4X4.75 (GAUZE/BANDAGES/DRESSINGS) ×2 IMPLANT
ELECT BLADE 4.0 EZ CLEAN MEGAD (MISCELLANEOUS)
ELECT CAUTERY BLADE 6.4 (BLADE) ×2 IMPLANT
ELECT REM PT RETURN 9FT ADLT (ELECTROSURGICAL) ×2
ELECTRODE BLDE 4.0 EZ CLN MEGD (MISCELLANEOUS) IMPLANT
ELECTRODE REM PT RTRN 9FT ADLT (ELECTROSURGICAL) ×1 IMPLANT
EVACUATOR SILICONE 100CC (DRAIN) ×2 IMPLANT
GAUZE SPONGE 4X4 12PLY STRL LF (GAUZE/BANDAGES/DRESSINGS) ×2 IMPLANT
GLOVE BIOGEL PI IND STRL 7.0 (GLOVE) ×3 IMPLANT
GLOVE BIOGEL PI INDICATOR 7.0 (GLOVE) ×3
GLOVE SS BIOGEL STRL SZ 7 (GLOVE) ×1 IMPLANT
GLOVE SUPERSENSE BIOGEL SZ 7 (GLOVE) ×1
GLOVE SURG SS PI 6.5 STRL IVOR (GLOVE) ×6 IMPLANT
GOWN STRL REUS W/ TWL LRG LVL3 (GOWN DISPOSABLE) ×4 IMPLANT
GOWN STRL REUS W/ TWL XL LVL3 (GOWN DISPOSABLE) ×1 IMPLANT
GOWN STRL REUS W/TWL LRG LVL3 (GOWN DISPOSABLE) ×4
GOWN STRL REUS W/TWL XL LVL3 (GOWN DISPOSABLE) ×1
KIT BASIN OR (CUSTOM PROCEDURE TRAY) ×2 IMPLANT
KIT TURNOVER KIT B (KITS) ×2 IMPLANT
NEEDLE 18GX1X1/2 (RX/OR ONLY) (NEEDLE) ×2 IMPLANT
NEEDLE FILTER BLUNT 18X 1/2SAF (NEEDLE) ×1
NEEDLE FILTER BLUNT 18X1 1/2 (NEEDLE) ×1 IMPLANT
NEEDLE HYPO 25GX1X1/2 BEV (NEEDLE) ×2 IMPLANT
NS IRRIG 1000ML POUR BTL (IV SOLUTION) ×2 IMPLANT
PACK GENERAL/GYN (CUSTOM PROCEDURE TRAY) ×2 IMPLANT
PAD ABD 8X10 STRL (GAUZE/BANDAGES/DRESSINGS) ×4 IMPLANT
PAD ARMBOARD 7.5X6 YLW CONV (MISCELLANEOUS) ×2 IMPLANT
PENCIL SMOKE EVACUATOR (MISCELLANEOUS) ×2 IMPLANT
PLASMABLADE 3.0S (MISCELLANEOUS)
SPECIMEN JAR X LARGE (MISCELLANEOUS) ×2 IMPLANT
SUT ETHILON 3 0 FSL (SUTURE) ×2 IMPLANT
SUT MNCRL AB 4-0 PS2 18 (SUTURE) ×2 IMPLANT
SUT SILK 2 0 PERMA HAND 18 BK (SUTURE) ×2 IMPLANT
SUT VIC AB 3-0 54X BRD REEL (SUTURE) ×1 IMPLANT
SUT VIC AB 3-0 BRD 54 (SUTURE) ×1
SYR CONTROL 10ML LL (SYRINGE) ×2 IMPLANT
TOWEL GREEN STERILE (TOWEL DISPOSABLE) ×2 IMPLANT
TOWEL GREEN STERILE FF (TOWEL DISPOSABLE) IMPLANT

## 2018-10-01 NOTE — Transfer of Care (Signed)
Immediate Anesthesia Transfer of Care Note  Patient: Roberta [name]  Procedure(s) Performed: LEFT TOTAL MASTECTOMY WITH SENTINEL LYMPH NODE BIOPSY AND BLUE DYE INJECTION (Left Breast)  Patient Location: PACU  Anesthesia Type:General and Regional  Level of Consciousness: awake, oriented and patient cooperative  Airway & Oxygen Therapy: Patient Spontanous Breathing and Patient connected to nasal cannula oxygen  Post-op Assessment: Report given to RN and Post -op Vital signs reviewed and stable  Post vital signs: Reviewed and stable  Last Vitals:  Vitals Value Taken Time  BP 115/87 10/01/18 0957  Temp    Pulse 95 10/01/18 0959  Resp 10 10/01/18 0959  SpO2 100 % 10/01/18 0959  Vitals shown include unvalidated device data.  Last Pain:  Vitals:   10/01/18 [id number]  PainSc: 0-No pain         Complications: No apparent anesthesia complications

## 2018-10-01 NOTE — Anesthesia Procedure Notes (Signed)
Anesthesia Regional Block: Pectoralis block   Pre-Anesthetic Checklist: ,, timeout performed, Correct Patient, Correct Site, Correct Laterality, Correct Procedure, Correct Position, site marked, Risks and benefits discussed,  Surgical consent,  Pre-op evaluation,  At surgeon's request and post-op pain management  Laterality: Left  Prep: chloraprep       Needles:  Injection technique: Single-shot  Needle Type: Echogenic Stimulator Needle     Needle Length: 10cm  Needle Gauge: 21     Additional Needles:   Procedures:,,,, ultrasound used (permanent image in chart),,,,  Narrative:  Start time: 10/01/2018 7:00 AM End time: 10/01/2018 7:06 AM Injection made incrementally with aspirations every 5 mL.  Performed by: Personally  Anesthesiologist: Lidia Collum, MD  Additional Notes: Monitors applied. Injection made in 5cc increments. No resistance to injection. Good needle visualization. Patient tolerated procedure well.

## 2018-10-01 NOTE — Anesthesia Procedure Notes (Signed)
Procedure Name: Intubation Date/Time: 10/01/2018 7:49 AM Performed by: Orlie Dakin, CRNA Pre-anesthesia Checklist: Patient identified, Emergency Drugs available, Suction available and Patient being monitored Patient Re-evaluated:Patient Re-evaluated prior to induction Oxygen Delivery Method: Circle system utilized Preoxygenation: Pre-oxygenation with 100% oxygen Induction Type: IV induction Ventilation: Mask ventilation without difficulty Laryngoscope Size: Mac and 4 Grade View: Grade I Tube type: Oral Tube size: 7.0 mm Number of attempts: 1 Airway Equipment and Method: Stylet Placement Confirmation: ETT inserted through vocal cords under direct vision,  positive ETCO2 and breath sounds checked- equal and bilateral Secured at: 24 cm Tube secured with: Tape Dental Injury: Teeth and Oropharynx as per pre-operative assessment  Comments: 4x4s bite block used.

## 2018-10-01 NOTE — Anesthesia Preprocedure Evaluation (Addendum)
Anesthesia Evaluation  Patient identified by MRN, date of birth, ID band Patient awake    Reviewed: Allergy & Precautions, NPO status , Patient's Chart, lab work & pertinent test results  History of Anesthesia Complications (+) PONVNegative for: history of anesthetic complications  Airway Mallampati: II  TM Distance: >3 FB Neck ROM: Full    Dental  (+) Teeth Intact, Dental Advisory Given   Pulmonary neg pulmonary ROS,    Pulmonary exam normal        Cardiovascular hypertension, Pt. on medications Normal cardiovascular exam     Neuro/Psych PSYCHIATRIC DISORDERS Depression negative neurological ROS     GI/Hepatic negative GI ROS, Neg liver ROS,   Endo/Other  diabetes, Oral Hypoglycemic AgentsMorbid obesity  Renal/GU negative Renal ROS  negative genitourinary   Musculoskeletal negative musculoskeletal ROS (+)   Abdominal   Peds  Hematology negative hematology ROS (+)   Anesthesia Other Findings Breast cancer  Reproductive/Obstetrics                           Anesthesia Physical Anesthesia Plan  ASA: III  Anesthesia Plan: General   Post-op Pain Management: GA combined w/ Regional for post-op pain   Induction:   PONV Risk Score and Plan: 4 or greater and Ondansetron, Dexamethasone, Midazolam, Propofol infusion and Treatment may vary due to age or medical condition  Airway Management Planned: LMA  Additional Equipment: None  Intra-op Plan:   Post-operative Plan: Extubation in OR  Informed Consent: I have reviewed the patients History and Physical, chart, labs and discussed the procedure including the risks, benefits and alternatives for the proposed anesthesia with the patient or authorized representative who has indicated his/her understanding and acceptance.     Dental advisory given  Plan Discussed with:   Anesthesia Plan Comments:        Anesthesia Quick  Evaluation

## 2018-10-01 NOTE — Interval H&P Note (Signed)
History and Physical Interval Note:  10/01/2018 5:45 AM  Roberta [name]  has presented today for surgery, with the diagnosis of LEFT BREAST CANCER.  The various methods of treatment have been discussed with the patient and family. After consideration of risks, benefits and other options for treatment, the patient has consented to  Procedure(s): LEFT TOTAL MASTECTOMY WITH SENTINEL LYMPH NODE BIOPSY AND BLUE DYE INJECTION (Left) as a surgical intervention.  The patient's history has been reviewed, patient examined, no change in status, stable for surgery.  I have reviewed the patient's chart and labs.  Questions were answered to the patient's satisfaction.     [name]

## 2018-10-01 NOTE — Anesthesia Postprocedure Evaluation (Signed)
Anesthesia Post Note  Patient: Roberta [name]  Procedure(s) Performed: LEFT TOTAL MASTECTOMY WITH SENTINEL LYMPH NODE BIOPSY AND BLUE DYE INJECTION (Left Breast)     Patient location during evaluation: PACU Anesthesia Type: Regional Level of consciousness: awake and alert Pain management: pain level controlled Vital Signs Assessment: post-procedure vital signs reviewed and stable Respiratory status: spontaneous breathing, nonlabored ventilation and respiratory function stable Cardiovascular status: blood pressure returned to baseline and stable Postop Assessment: no apparent nausea or vomiting Anesthetic complications: no    Last Vitals:  Vitals:   10/01/18 1214 10/01/18 1255  BP:  115/83  Pulse: 84 83  Resp: 20 19  Temp:  36.9 C  SpO2: 98% 100%    Last Pain:  Vitals:   10/01/18 1255  TempSrc: Oral  PainSc:                  Lidia Collum

## 2018-10-01 NOTE — Op Note (Signed)
Patient Name:           Roberta [name]   Date of Surgery:        10/01/2018  Pre op Diagnosis:      Multifocal cancer left breast  Post op Diagnosis:    Same  Procedure:                 Inject blue dye left breast                                      Left total mastectomy                                      Left axillary deep sentinel lymph node biopsy  Surgeon:                     Haywood M. Ingram, M.D., FACS  Assistant:                      RNFA   Indication for Assistant: Expedite procedure and hemostasis.  Provide exposure  Operative Indications:         .This is a pleasant 53-year-old female who is brought to the operating room for definitive surgery for her multifocal left breast cancer. Dr. Magrinat and Dr. Moody are involved. Stacy Burns is her PCP. Dr.Banga is her gynecologist .       Screening mammograms on August 01, 2018 were actually normal. But her gynecologist subsequently felt a lump in the left breast. Further imaging studies showed 2 findings in the left breast. At the 10 o'clock position just outside the areolar margin, upper inner there is a 2.2 cm mass which was biopsied and showed grade 2 invasive ductal carcinoma, ER 100%, PR 2%, HER-2 negative, Ki-67 10% Secondly there was an 8 mm area of calcifications in the left breast, upper outer which is biopsied and showed high-grade DCIS, ER 95%, PR 100% because of these areas are anterior the clips are said to be 4.7 cm apart Because of her multicentric disease we advised left mastectomy and sentinel node biopsy and she was comfortable with that. She was interested in reconstruction. Her breasts are large and ptotic and so she is not good candidate for nipple sparing mastectomy. She has seen Dr. Thimmappa. The patient does not want implants. She wants autologous tissue transfer. She wants DIEP. She was advised against that as an immediate reconstruction due to her high BMI. She was told she would need to lose  weight. She wants to go ahead with mastectomy and sentinel node without reconstruction.       Oncotype score 20 with 6% recurrence rate so she will not need chemotherapy. This was done on the core biopsy She has chronic left shoulder disability and is been receiving preop physical therapy for that.      Past history significant for gastric bypass Cholecystectomy Hypertension. Prediabetes. She wants me to go ahead and schedule her for injection blue dye left breast, left total mastectomy, left axillary deep sentinel lymph node biopsy   Operative Findings:       There were no gross palpable masses in the left breast.  A total mastectomy was performed.  I found 4 or 5 sentinel nodes in the left axilla.  They were not   grossly abnormal.  Procedure in Detail:          Following the induction of general endotracheal anesthesia intravenous antibiotics were given.  Surgical timeout was performed.  Following alcohol prep I injected 5 cc of dilute methylene blue into the left breast, subareolar area, and massaged the breast for a few minutes.      We then prepped and draped the entire left chest wall and axilla.  Using a marking pen I planned a transverse elliptical incision so as to get primary closure without skin redundancy or dogears.  The transverse elliptical incision was made.  Skin flaps were raised medially to the parasternal area, superiorly to the infraclavicular area, laterally to the anterior border of the latissimus dorsi muscle and inferiorly to the inframammary crease.  Using the neoprobe I dissected up into the left axilla and found 4 or 5 sentinel lymph nodes which were sent as a separate specimen.  The lateral skin margin of the breast was marked with a silk suture.  The breast was then dissected off of the pectoralis major and pectoralis minor muscles with electrocautery, removed from the field and sent to pathology.  Hemostasis was excellent and achieved with electrocautery and metal clips.   The wound was irrigated.  Two 19 French Blake drains were placed, one up into the axilla and one across the skin flaps anteriorly.  These were brought out through separate stab incision inferiorly, sutured the skin and connected to suction bulbs.  The skin incision was closed in 2 layers.  The subcutaneous tissue was closed with interrupted 3-0 Vicryl and the skin closed with a running subcuticular 4-0 Monocryl and Dermabond.  Dry bandages and a breast binder were placed.  The patient tolerated the procedure well and was taken to PACU in stable condition.  EBL 100 cc or less.  Counts correct.  Complications none.   Addendum: I logged onto the PMP aware website and reviewed her prescription medication history     Haywood M. Ingram, M.D., FACS General and Minimally Invasive Surgery Breast and Colorectal Surgery  10/01/2018 9:36 AM  

## 2018-10-02 ENCOUNTER — Telehealth: Payer: Self-pay | Admitting: Internal Medicine

## 2018-10-02 ENCOUNTER — Encounter (HOSPITAL_COMMUNITY): Payer: Self-pay | Admitting: General Surgery

## 2018-10-02 DIAGNOSIS — C50812 Malignant neoplasm of overlapping sites of left female breast: Secondary | ICD-10-CM | POA: Diagnosis not present

## 2018-10-02 LAB — GLUCOSE, CAPILLARY: Glucose-Capillary: 91 mg/dL (ref 70–99)

## 2018-10-02 MED ORDER — HYDROCHLOROTHIAZIDE 25 MG PO TABS: 25.0000 mg | ORAL_TABLET | Freq: Every day | ORAL | 1 refills | Status: DC

## 2018-10-02 MED ORDER — HYDROCODONE-ACETAMINOPHEN 5-325 MG PO TABS: 1.0000 | ORAL_TABLET | ORAL | 0 refills | Status: DC | PRN

## 2018-10-02 NOTE — Telephone Encounter (Signed)
Spoke with pt and she is concerned that she was taken off of her losartan-hctz and just put on hctz by itself. She states her BP was controlled really well on the losartan-hctz and she does not want to mess her BP up. She was also taken off of  zoloft that she was only on x1 week. She wanted to see if you think it will be ok to take losartan-hctz and wanted to see if you knew why she was taken off of zoloft. If she needs an OV let me know.

## 2018-10-02 NOTE — Discharge Summary (Signed)
Patient ID: Roberta [name] [id number] y.o. [birth year]  Admit date: 10/01/2018  Discharge date and time: 10/02/2018  Admitting Physician: [name]  Discharge Physician: [name]  Admission Diagnoses: LEFT BREAST CANCER  Discharge Diagnoses: Malignant neoplasm overlapping sites left breast, receptor positive                                         Adhesive capsulitis left shoulder                                          Morbid obesity                                          History of Roux-en-Y gastric bypass                                          History of laparoscopic cholecystectomy                                          Prediabetes                                          Hypertension, essential, benign  Operations: Procedure(s): LEFT TOTAL MASTECTOMY WITH SENTINEL LYMPH NODE BIOPSY AND BLUE DYE INJECTION  Admission Condition: good  Discharged Condition: good  Indication for Admission: .This is a pleasant 53 year old female who is brought to the operating room for definitive surgery for her multifocal left breast cancer. Dr. Jana Hakim and Dr. Lisbeth Renshaw are involved. Billey Gosling is her PCP. Dr.Banga is her gynecologist . Screening mammograms on August 01, 2018 were actually normal. But her gynecologist subsequently felt a lump in the left breast. Further imaging studies showed 2 findings in the left breast. At the 10 o'clock position just outside the areolar margin, upper inner there is a 2.2 cm mass which was biopsied and showed grade 2 invasive ductal carcinoma, ER 100%, PR 2%, HER-2 negative, Ki-67 10% Secondly there was an 8 mm area of calcifications in the left breast, upper outer which is biopsied and showed high-grade DCIS, ER 95%, PR 100% because of these areas are anterior the clips are said to be 4.7 cm apart Because of her multicentric disease we advised left mastectomy and sentinel node biopsy and she was comfortable with that. She  was interested in reconstruction. Her breasts are large and ptotic and so she is not good candidate for nipple sparing mastectomy. She has seen Dr. Iran Planas. The patient does not want implants. She wants autologous tissue transfer. She wants DIEP. She was advised against that as an immediate reconstruction due to her high BMI. She was told she would need to lose weight. She wants to go ahead with mastectomy and sentinel node without reconstruction. Oncotype score 20 with 6% recurrence rate so she will not need chemotherapy. This was done on the core  biopsy She has chronic left shoulder disability and is been receiving preop physical therapy for that. Past history significant for gastric bypass Cholecystectomy Hypertension. Prediabetes. She wants me to go ahead and schedule her for injection blue dye left breast, left total mastectomy, left axillary deep sentinel lymph node biopsy  Hospital Course: On the day of admission the patient was taken to the operating room.  She underwent injection blue dye left breast, left total mastectomy, left axillary deep sentinel lymph node biopsy.  The surgery was uneventful.  There were no unusual findings     She was admitted to the hospital overnight for observation and did very well.  On postop day 1 she had minimal pain.  Was alert, in good spirits and wanted to go home.  She was tolerating a diet.  Ambulating to the bathroom.  Examination of the wound on postop day 1 revealed healthy skin flaps.  No hematoma or seroma.  Drainage was serosanguineous in both drains.  She had good range of motion left shoulder.  There was no sensory deficit under the left arm.     She was given instructions in diet, activities, pain control, same shoulder range of motion, drain care.      She was instructed in pain management including Tylenol, ibuprofen, and I did call in a prescription for Norco 5-3 25 to her pharmacy just in case she needs it.      She has 2  nephews that are going to come to the house to help her.  Also 1 of her sisters is a [employer] manager.  She has good social support.      She will return to see me in the office in 2 weeks for wound check and possible drain removal.  I will call the pathology report to her hopefully by the end of the week.  She will follow-up with Dr. Jana Hakim.  Consults: None  Significant Diagnostic Studies: Surgical pathology  Treatments: surgery: Left mastectomy with sentinel node biopsy and injection blue dye  Disposition: Home  Patient Instructions:  Allergies as of 10/02/2018      Reactions   Lisinopril Swelling   Swelling of the tongue   Penicillins    UNSPECIFIED CHILDHOOD REACTION   Norvasc [amlodipine Besylate] Rash   Red Dye Rash   Tetanus Toxoids Swelling   Swelling at site of injection      Medication List    STOP taking these medications   losartan-hydrochlorothiazide 100-25 MG tablet Commonly known as: HYZAAR   sertraline 25 MG tablet Commonly known as: ZOLOFT     TAKE these medications   Biotin 10000 MCG Tabs Take 10,000 mcg by mouth at bedtime.   hydrochlorothiazide 25 MG tablet Commonly known as: HYDRODIURIL Take 1 tablet (25 mg total) by mouth daily. Start taking on: October 03, 2018   HYDROcodone-acetaminophen 5-325 MG tablet Commonly known as: NORCO/VICODIN Take 1-2 tablets by mouth every 4 (four) hours as needed for moderate pain.   metFORMIN 500 MG tablet Commonly known as: GLUCOPHAGE Take 1 tablet (500 mg total) by mouth daily with breakfast.   multivitamin tablet Take 1 tablet by mouth daily.   OMEGA 3 500 PO Take 500 mg by mouth daily.   Vitamin D-3 125 MCG (5000 UT) Tabs Take 1 tablet by mouth daily. What changed: how much to take   vitamin E 100 UNIT capsule Take 180 Units by mouth at bedtime.   zinc gluconate 50 MG tablet Take 50 mg by mouth at bedtime.  Discharge Care Instructions  (From admission, onward)         Start     Ordered    10/02/18 0000  Discharge wound care:    Comments: Keep a written record of the drainage and bring that to the office with you each time Move your shoulders around several times a day, starting now  Keep the wound clean and dry for the most part You may take a quick shower, starting on Friday and thereafter No tub baths  Use the elastic bandage to hold the dry gauze in place. Do not use tape unless you have to   10/02/18 0956          Activity: Ambulate frequently.  Shoulder range of motion exercises frequently.  No driving for 1 to 2 weeks.  No heavy lifting. Diet: low fat, low cholesterol diet Wound Care: as directed  Follow-up:  With Dr. Dalbert Batman in 2 weeks     Addendum: I logged onto the PMP aware website and reviewed her prescription medication history  .  Signed: Edsel Petrin. Dalbert Batman, M.D., FACS General and minimally invasive surgery Breast and Colorectal Surgery  10/02/2018, 9:58 AM

## 2018-10-02 NOTE — Plan of Care (Signed)
  Problem: Education: Goal: Required Educational Video(s) Outcome: Progressing   Problem: Clinical Measurements: Goal: Postoperative complications will be avoided or minimized Outcome: Progressing   Problem: Skin Integrity: Goal: Demonstration of wound healing without infection will improve Outcome: Progressing   

## 2018-10-02 NOTE — Telephone Encounter (Signed)
I reviewed the discharge summary and there is no explanation for why the medications were discontinued.  If her blood pressure is on the low side they may have stopped the losartan, but if it is elevated then we need to restart the losartan and continue the hydrochlorothiazide.  I think it is okay for her to resume the sertraline.

## 2018-10-02 NOTE — Telephone Encounter (Signed)
Patient is in the hospital admitted and would like to talk to Dr. Quay Burow or her [place] regarding an issue she is having. She stated that they stopped giving her, her BP medication but did not replace it and wants to know she needs to do to recommend.

## 2018-10-02 NOTE — Progress Notes (Signed)
Discharged pt to home, instructions given and explained. 

## 2018-10-03 ENCOUNTER — Telehealth: Payer: Self-pay | Admitting: *Deleted

## 2018-10-03 LAB — SURGICAL PATHOLOGY

## 2018-10-03 NOTE — Telephone Encounter (Signed)
Spoke with patient today. She states she was contacted back and was told it was a mistake and to continue her meds as previously so she is good to go now.

## 2018-10-03 NOTE — Telephone Encounter (Signed)
Patient discharged from the hospital 10/02/18 after total left mastectomy. Patient reports that she is doing well and her pain is well tolerated. She has 2 drains that are draining well and understands all of her discharge instructions. Reviewed the discharge follow-ups with patient and when to call surgeon if complications or signs of infections occur. Patient has a f/u appointment scheduled with Dr. Quay Burow 10/28/18. Ms. Simuel verbalized understanding and was appreciative for the phone call.

## 2018-10-04 NOTE — Progress Notes (Signed)
Inform patient of Pathology report,. Tell her that all of the cancer was removed and all of the lymph nodes are negative. She will not need any further surgery. I will discuss in detail at next OV.  Thanks, Dalbert Batman

## 2018-10-07 ENCOUNTER — Encounter: Payer: Self-pay | Admitting: *Deleted

## 2018-10-09 NOTE — Progress Notes (Signed)
[facility] Psychosocial Distress Screening Counseling Intern  Counseling Intern was referred by distress screening protocol.  The patient scored a 1 on the Psychosocial Distress Thermometer which indicates mild distress.Counseling Intern contacted patient by phone to assess for distress and other psychosocial needs.   ONCBCN DISTRESS SCREENING 10/09/2018  Distress experienced in past week (1-10) 1  Practical problem type Work/school  Emotional problem type Nervousness/Anxiety;Depression  Referral to support programs Yes   Clinical Social Worker follow up needed: No.  The patient stated she is "hanging in there".  She reported that her symptoms of anxiety and depression have improved since starting on an anti-depressant.  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   The patient declined counseling services at this time, but I will e-mail the patient information about support services and plan to check-in with the patient in 3 weeks to offer emotional and mental health support at that time.    Art Buff [place] Counseling Intern Voicemail:  [phone]

## 2018-10-10 ENCOUNTER — Telehealth (INDEPENDENT_AMBULATORY_CARE_PROVIDER_SITE_OTHER): Payer: BC Managed Care – PPO | Admitting: Family Medicine

## 2018-10-10 ENCOUNTER — Encounter (INDEPENDENT_AMBULATORY_CARE_PROVIDER_SITE_OTHER): Payer: Self-pay | Admitting: Family Medicine

## 2018-10-10 ENCOUNTER — Other Ambulatory Visit: Payer: Self-pay

## 2018-10-10 DIAGNOSIS — F32A Depression, unspecified: Secondary | ICD-10-CM

## 2018-10-10 DIAGNOSIS — F329 Major depressive disorder, single episode, unspecified: Secondary | ICD-10-CM | POA: Diagnosis not present

## 2018-10-10 DIAGNOSIS — I10 Essential (primary) hypertension: Secondary | ICD-10-CM

## 2018-10-10 DIAGNOSIS — F419 Anxiety disorder, unspecified: Secondary | ICD-10-CM

## 2018-10-10 DIAGNOSIS — Z6841 Body Mass Index (BMI) 40.0 and over, adult: Secondary | ICD-10-CM

## 2018-10-13 NOTE — Progress Notes (Signed)
Office: [phone]  /  Fax: [phone] TeleHealth Visit:  Roberta [name] has verbally consented to this TeleHealth visit today. The patient is located at home, the provider is located at the [employer] [facility] office. The participants in this visit include the listed provider and patient. The visit was conducted today via webex.  HPI:   Chief Complaint: OBESITY Roberta [name] is here to discuss her progress with her obesity treatment plan. She is on the Category 2 plan + 100 calories and is following her eating plan approximately 50 % of the time. She states she is doing light cleaning for 30-40 minutes 1 time per week. Roberta [name] is post surgery. She had a mastectomy 1 week ago. She notes her pain is well controlled with Tylenol and ibuprofen. Her pathology report came back as total removal of both cancers. She is going next Thursday to get drains pulled. She just starting to get her appetite back. She needs to get 45 lbs off before getting breast reconstruction surgery. Her weight was of 261 lbs yesterday. We were unable to weigh the patient today for this TeleHealth visit. She feels as if she has lost 1 lb since her last visit. She has lost 30 lbs since starting treatment with [place].  Hypertension Roberta [name] is a 53 y.o. female with hypertension. Roberta [name] was taken off losartan-hydrochlorothiazide, and was only given hydrochlorothiazide but voices this was a mistake. She denies chest pain, chest pressure, dizziness, or lightheadedness. She is working on weight loss to help control her blood pressure with the goal of decreasing her risk of heart attack and stroke.   Depression with Anxiety Roberta [name] struggles with depression and anxiety. She notes her symptoms are better controlled with Zoloft. She shows no sign of suicidal or homicidal ideations.  ASSESSMENT AND PLAN:  Essential hypertension  Anxiety and depression  Class 3 severe obesity with serious comorbidity and body mass index  (BMI) of 40.0 to 44.9 in adult, unspecified obesity type (Roberta [name])  PLAN:  Hypertension We discussed sodium restriction, working on healthy weight loss, and a regular exercise program as the means to achieve improved blood pressure control. Malaysiah agreed with this plan and agreed to follow up as directed. We will continue to monitor her blood pressure as well as her progress with the above lifestyle modifications. Nizhoni agrees to continue taking losartan-hydrochlorothiazide and will watch for signs of hypotension as she continues her lifestyle modifications. Roberta [name] agrees to follow up with our clinic in 2 weeks.  Depression with Anxiety We discussed behavior modification techniques today to help Roberta [name] deal with her depression and anxiety. Cailen agrees to continue taking Zoloft with no refill needed. Roberta [name] agrees to follow up with our clinic in 2 weeks.  Obesity Roberta [name] is currently in the action stage of change. As such, her goal is to continue with weight loss efforts She has agreed to follow the Category 2 plan Roberta [name] has been instructed to work up to a goal of 150 minutes of combined cardio and strengthening exercise per week for weight loss and overall health benefits. We discussed the following Behavioral Modification Strategies today: increasing lean protein intake, increasing vegetables and work on meal planning and easy cooking plans, keeping healthy foods in the home, and planning for success   Roberta [name] has agreed to follow up with our clinic in 2 weeks with Roberta Schwab, FNP-C. She was informed of the importance of frequent follow up visits to maximize her success with intensive lifestyle modifications for her multiple  health conditions.  ALLERGIES: Allergies  Allergen Reactions  . Lisinopril Swelling    Swelling of the tongue  . Penicillins     UNSPECIFIED CHILDHOOD REACTION  . Norvasc [Amlodipine Besylate] Rash  . Red Dye Rash  . Tetanus Toxoids Swelling    Swelling at site of  injection    MEDICATIONS: Current Outpatient Medications on File Prior to Visit  Medication Sig Dispense Refill  . Biotin 10000 MCG TABS Take 10,000 mcg by mouth at bedtime.     . Cholecalciferol (VITAMIN D-3) 5000 units TABS Take 1 tablet by mouth daily. (Patient taking differently: Take 5,000 Units by mouth daily. ) 30 tablet   . hydrochlorothiazide (HYDRODIURIL) 25 MG tablet Take 1 tablet (25 mg total) by mouth daily. 30 tablet 1  . HYDROcodone-acetaminophen (NORCO/VICODIN) 5-325 MG tablet Take 1-2 tablets by mouth every 4 (four) hours as needed for moderate pain. 20 tablet 0  . metFORMIN (GLUCOPHAGE) 500 MG tablet Take 1 tablet (500 mg total) by mouth daily with breakfast. 30 tablet 0  . Multiple Vitamin (MULTIVITAMIN) tablet Take 1 tablet by mouth daily.    . Omega-3 Fatty Acids (OMEGA 3 500 PO) Take 500 mg by mouth daily.     . vitamin E 100 UNIT capsule Take 180 Units by mouth at bedtime.     Marland Kitchen zinc gluconate 50 MG tablet Take 50 mg by mouth at bedtime.      No current facility-administered medications on file prior to visit.     PAST MEDICAL HISTORY: Past Medical History:  Diagnosis Date  . Back pain   . Cancer [facility])    multifocal left breast  . Depression   . Hip pain   . Hypertension   . Obesity   . PONV (postoperative nausea and vomiting)   . Prediabetes   . Swelling    bilat LE  . Wears glasses     PAST SURGICAL HISTORY: Past Surgical History:  Procedure Laterality Date  . CHOLECYSTECTOMY    . COLONOSCOPY    . DILATION AND CURETTAGE OF UTERUS    . GASTRIC BYPASS    . KNEE SURGERY  2002   Ligament repair  . MASTECTOMY W/ SENTINEL NODE BIOPSY Left 10/01/2018   Procedure: LEFT TOTAL MASTECTOMY WITH SENTINEL LYMPH NODE BIOPSY AND BLUE DYE INJECTION;  Surgeon: Fanny Skates, MD;  Location: [place];  Service: General;  Laterality: Left;    SOCIAL HISTORY: Social History   Tobacco Use  . Smoking status: Never Smoker  . Smokeless tobacco: Never Used   Substance Use Topics  . Alcohol use: Yes    Comment: occasional wine  . Drug use: No    FAMILY HISTORY: Family History  Problem Relation Age of Onset  . Diabetes Mother   . Hypertension Mother   . Heart disease Mother   . Stroke Mother   . Kidney disease Mother   . Obesity Mother   . Cancer Maternal Aunt        lung  . Cancer Maternal Uncle        lung  . Throat cancer Maternal Aunt     ROS: Review of Systems  Constitutional: Positive for weight loss.  Cardiovascular: Negative for chest pain.       Negative chest pressure  Neurological: Negative for dizziness.       Negative lightheadedness  Psychiatric/Behavioral: Positive for depression. Negative for suicidal ideas.       + Anxiety    PHYSICAL EXAM: Pt in no  acute distress  RECENT LABS AND TESTS: BMET    Component Value Date/Time   NA 137 09/25/2018 0904   NA 139 07/17/2018 1547   K 3.5 09/25/2018 0904   CL 102 09/25/2018 0904   CO2 28 09/25/2018 0904   GLUCOSE 112 (H) 09/25/2018 0904   BUN 15 09/25/2018 0904   BUN 16 07/17/2018 1547   CREATININE 1.03 (H) 10/01/2018 1255   CREATININE 0.95 09/04/2018 0840   CALCIUM 8.8 (L) 09/25/2018 0904   GFRNONAA >60 10/01/2018 1255   GFRNONAA >60 09/04/2018 0840   GFRAA >60 10/01/2018 1255   GFRAA >60 09/04/2018 0840   Lab Results  Component Value Date   HGBA1C 6.0 (H) 09/25/2018   HGBA1C 5.9 (H) 07/17/2018   HGBA1C 6.1 (H) 01/31/2018   HGBA1C 6.0 (H) 10/08/2017   HGBA1C 6.3 06/25/2017   Lab Results  Component Value Date   INSULIN 13.4 01/31/2018   INSULIN 10.6 10/08/2017   CBC    Component Value Date/Time   WBC 12.1 (H) 10/01/2018 1255   RBC 4.53 10/01/2018 1255   HGB 12.2 10/01/2018 1255   HGB 11.8 (L) 09/04/2018 0840   HGB 12.3 07/17/2018 1547   HCT 36.9 10/01/2018 1255   HCT 38.5 07/17/2018 1547   PLT 222 10/01/2018 1255   PLT 260 09/04/2018 0840   MCV 81.5 10/01/2018 1255   MCV 83 07/17/2018 1547   MCH 26.9 10/01/2018 1255   MCHC 33.1  10/01/2018 1255   RDW 14.4 10/01/2018 1255   RDW 14.9 07/17/2018 1547   LYMPHSABS 1.5 09/25/2018 0904   LYMPHSABS 1.4 07/17/2018 1547   MONOABS 0.5 09/25/2018 0904   EOSABS 0.0 09/25/2018 0904   EOSABS 0.1 07/17/2018 1547   BASOSABS 0.0 09/25/2018 0904   BASOSABS 0.0 07/17/2018 1547   Iron/TIBC/Ferritin/ %Sat    Component Value Date/Time   IRON 162 (H) 01/22/2017 1152   FERRITIN 14.5 01/22/2017 1152   Lipid Panel     Component Value Date/Time   CHOL 112 10/08/2017 1239   TRIG 69 10/08/2017 1239   HDL 50 10/08/2017 1239   CHOLHDL 2 06/25/2017 1541   VLDL 15.0 06/25/2017 1541   LDLCALC 48 10/08/2017 1239   Hepatic Function Panel     Component Value Date/Time   PROT 6.9 09/04/2018 0840   PROT 6.9 07/17/2018 1547   ALBUMIN 3.0 (L) 09/04/2018 0840   ALBUMIN 3.6 (L) 07/17/2018 1547   AST 13 (L) 09/04/2018 0840   ALT 10 09/04/2018 0840   ALKPHOS 65 09/04/2018 0840   BILITOT 0.4 09/04/2018 0840      Component Value Date/Time   TSH 1.480 10/08/2017 1239   TSH 1.24 06/25/2017 1541   TSH 1.53 07/11/2016 1150      I, Trixie Dredge, am acting as Location manager for Ilene Qua, MD  I have reviewed the above documentation for accuracy and completeness, and I agree with the above. - Ilene Qua, MD

## 2018-10-21 ENCOUNTER — Other Ambulatory Visit: Payer: Self-pay

## 2018-10-21 ENCOUNTER — Ambulatory Visit: Payer: BC Managed Care – PPO | Attending: General Surgery | Admitting: Physical Therapy

## 2018-10-21 DIAGNOSIS — R293 Abnormal posture: Secondary | ICD-10-CM | POA: Diagnosis present

## 2018-10-21 DIAGNOSIS — Z483 Aftercare following surgery for neoplasm: Secondary | ICD-10-CM | POA: Diagnosis present

## 2018-10-21 DIAGNOSIS — Z170 Estrogen receptor positive status [ER+]: Secondary | ICD-10-CM | POA: Diagnosis present

## 2018-10-21 DIAGNOSIS — M6281 Muscle weakness (generalized): Secondary | ICD-10-CM | POA: Insufficient documentation

## 2018-10-21 DIAGNOSIS — C50212 Malignant neoplasm of upper-inner quadrant of left female breast: Secondary | ICD-10-CM | POA: Insufficient documentation

## 2018-10-21 DIAGNOSIS — M25612 Stiffness of left shoulder, not elsewhere classified: Secondary | ICD-10-CM | POA: Diagnosis not present

## 2018-10-21 NOTE — Patient Instructions (Signed)
Closed Chain: Shoulder Abduction / Adduction - on Wall    One hand on wall, step to side and return. Stepping causes shoulder to abduct and adduct. Step _5__ times, holding 5-10 seconds, _2__ times per day.  http://ss.exer.us/267   Copyright  VHI. All rights reserved.  Closed Chain: Shoulder Flexion / Extension - on Wall    Hands on wall, step backward. Return. Stepping causes shoulder flexion and extension Do _5__ times, holding 5-10 seconds, _2_ times per day.  http://ss.exer.us/265   Copyright  VHI. All rights reserved.

## 2018-10-21 NOTE — Therapy (Signed)
[place], [place], [address] Phone: [phone]   Fax:  [phone]  Physical Therapy Treatment  Patient Details  Name: Roberta [name] MRN: [id number] Date of Birth: March 31, 1965 Referring Provider (PT): Dr. Fanny Skates   Encounter Date: 10/21/2018  PT End of Session - 10/21/18 1352    Visit Number  6    Number of Visits  9    Date for PT Re-Evaluation  11/18/18    PT Start Time  1300    PT Stop Time  1356    PT Time Calculation (min)  56 min    Activity Tolerance  Patient tolerated treatment well    Behavior During Therapy  [facility] for tasks assessed/performed       Past Medical History:  Diagnosis Date  . Back pain   . Cancer [facility])    multifocal left breast  . Depression   . Hip pain   . Hypertension   . Obesity   . PONV (postoperative nausea and vomiting)   . Prediabetes   . Swelling    bilat LE  . Wears glasses     Past Surgical History:  Procedure Laterality Date  . CHOLECYSTECTOMY    . COLONOSCOPY    . DILATION AND CURETTAGE OF UTERUS    . GASTRIC BYPASS    . KNEE SURGERY  2002   Ligament repair  . MASTECTOMY W/ SENTINEL NODE BIOPSY Left 10/01/2018   Procedure: LEFT TOTAL MASTECTOMY WITH SENTINEL LYMPH NODE BIOPSY AND BLUE DYE INJECTION;  Surgeon: Fanny Skates, MD;  Location: [place];  Service: General;  Laterality: Left;    There were no vitals filed for this visit.  Subjective Assessment - 10/21/18 1310    Subjective  Patient underwent a left mastectomy with sentinel node biopsy (0/5 nodes positive) on 10/01/2018. She does not need chemotherapy or radiation. She wants to have a TRAM flap performed but was told she has to lose 45 pounds first. Drains were removed 10/17/2018.    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 Patient underwent a left mastectomy with sentinel node biopsy (0/5 nodes positive)  on 10/01/2018. It is ER/PR positive and HER2 negative with a Ki67 of 10%. She had gastric bypass surgery in 2005, is pre-diabetic, and has hypertension.    Patient Stated Goals  Get my arm better    Currently in Pain?  Yes    Pain Score  3     Pain Location  Chest    Pain Orientation  Left    Pain Descriptors / Indicators  Tightness    Pain Type  Surgical pain    Pain Onset  1 to 4 weeks ago    Pain Frequency  Intermittent    Aggravating Factors   Laying down    Pain Relieving Factors  Medication    Multiple Pain Sites  No         [facility] PT Assessment - 10/21/18 0001      Assessment   Medical Diagnosis  s/p left mastectomy and SLNB    Referring Provider (PT)  Dr. Fanny Skates    Onset Date/Surgical Date  10/01/18    Hand Dominance  Right    Prior Therapy  Baselines      Precautions   Precautions  Other (comment)    Precaution Comments  recent surgery      Restrictions   Weight Bearing Restrictions  No      Balance Screen   Has the patient fallen in the past 6 months  No    Has the patient had a decrease in activity level because of a fear of falling?   No    Is the patient reluctant to leave their home because of a fear of falling?   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 Surgical glue is still present. No c/o tenderness to palpation. No edema noted.      Posture/Postural Control   Posture/Postural Control  Postural limitations    Postural Limitations  Rounded  Shoulders;Forward head      ROM / Strength   AROM / PROM / Strength  AROM      AROM   Right/Left Shoulder  Right;Left    Left Shoulder Extension  52 Degrees    Left Shoulder Flexion  86 Degrees    Left Shoulder ABduction  103 Degrees    Left Shoulder Internal Rotation  45 Degrees    Left Shoulder External Rotation  48 Degrees        LYMPHEDEMA/ONCOLOGY QUESTIONNAIRE - 10/21/18 1322      Type   Cancer Type  Left breast cancer      Surgeries   Mastectomy Date  10/01/18    Sentinel Lymph Node Biopsy Date  10/01/18    Number Lymph Nodes Removed  5      Treatment   Active Chemotherapy Treatment  No    Past Chemotherapy Treatment  No    Active Radiation Treatment  No    Past Radiation Treatment  No    Current Hormone Treatment  No    Past Hormone Therapy  No      What other symptoms do you have   Are you Having Heaviness or Tightness  Yes    Are you having Pain  Yes    Are you having pitting edema  No    Is it Hard or Difficult finding clothes that fit  No    Do you have infections  No    Is there Decreased scar mobility  No    Stemmer Sign  No      Lymphedema Assessments   Lymphedema Assessments  Upper extremities      Right Upper Extremity Lymphedema   10 cm Proximal to Olecranon Process  42.9 cm    Olecranon Process  26.9 cm    10 cm Proximal to Ulnar Styloid Process  21.5 cm    Just Proximal to Ulnar Styloid Process  15.1 cm    Across Hand at [employer]  18.9 cm    At [place] of 2nd Digit  5.7 cm      Left Upper Extremity Lymphedema   10 cm Proximal to Olecranon Process  48 cm    Olecranon Process  27.4  cm    10 cm Proximal to Ulnar Styloid Process  21.7 cm    Just Proximal to Ulnar Styloid Process  15.9 cm    Across Hand at [employer]  18.6 cm    At [place] of 2nd Digit  5.7 cm        Quick Dash - 10/21/18 0001    Open a tight or new jar  Mild difficulty    Do heavy household chores (wash walls, wash floors)  Unable    Carry a shopping bag or  briefcase  Unable    Wash your back  No difficulty    Use a knife to cut food  No difficulty    Recreational activities in which you take some force or impact through your arm, shoulder, or hand (golf, hammering, tennis)  Unable    During the past week, to what extent has your arm, shoulder or hand problem interfered with your normal social activities with family, friends, neighbors, or groups?  Modererately    During the past week, to what extent has your arm, shoulder or hand problem limited your work or other regular daily activities  Quite a bit    Arm, shoulder, or hand pain.  Severe    Tingling (pins and needles) in your arm, shoulder, or hand  Extreme    Difficulty Sleeping  Moderate difficulty    DASH Score  61.36 %             [facility] Adult PT Treatment/Exercise - 10/21/18 0001      Shoulder Exercises: Pulleys   Flexion  2 minutes    Flexion Limitations  Verbal cues to work in painfree ROM    ABduction  2 minutes    ABduction Limitations  Verbal cues to work in painfree ROM             PT Education - 10/21/18 1351    Education Details  HEP and closed chain shoulder ROM exercises    Person(s) Educated  Patient    Methods  Explanation;Demonstration;Handout    Comprehension  Verbalized understanding;Returned demonstration          PT Long Term Goals - 10/21/18 1532      PT LONG TERM GOAL #1   Title  Patient will be independent with a shoulder ROM HEP to improve ROM    Time  4    Period  Weeks    Status  On-going    Target Date  11/18/18      PT LONG TERM GOAL #2   Title  Patient will increase left shoulder active flexion ROM to >/= 140 degrees for increased ease reaching overhead and to return to baseline.    Baseline  86 taken post op    Time  4    Period  Weeks    Status  Revised    Target Date  11/18/18      PT LONG TERM GOAL #3   Title  Patient will increase left active shoulder abduction to >/= 140 degrees for increased ease reaching and to  return to baseline.    Baseline  103 degrees post op    Time  4    Period  Weeks    Status  Revised    Target Date  11/18/18      PT LONG TERM GOAL #4   Title  Patient will improve her DASH score to be </= 10 for improved overall upper extremity function.  Baseline  61.36 post-op; 2.27 pre-op    Time  4    Period  Weeks    Status  New    Target Date  11/18/18      PT LONG TERM GOAL #5   Title  Patient will verbalize good understanding of lymphedema risk reduction practices.    Time  4    Period  Weeks    Status  New    Target Date  11/18/18            Plan - 10/21/18 1353    Clinical Impression Statement  Patient is doing well s/p left mastectomy and sentinel node biopsy on 10/01/2018. She had 5 axillary nodes removed but were all negative. She does not need chemotherapy or radiation. Her shoulder ROM is significantly limited compared to pre-surgery. She will benefit from PT to regain shoulder ROM and reduce her risk for lymphedema.    Rehab Potential  Excellent    PT Frequency  2x / week    PT Duration  4 weeks    PT Treatment/Interventions  ADLs/Self Care Home Management;Therapeutic exercise;Manual techniques;Patient/family education;Moist Heat;Passive range of motion;Scar mobilization    PT Next Visit Plan  Pulleys, PROM, ROM exercises    PT Home Exercise Plan  Post op shoulder ROM HEP    Consulted and Agree with Plan of Care  Patient       Patient will benefit from skilled therapeutic intervention in order to improve the following deficits and impairments:  Postural dysfunction, Decreased range of motion, Decreased knowledge of precautions, Impaired UE functional use, Pain, Increased fascial restricitons, Decreased strength  Visit Diagnosis: Stiffness of left shoulder, not elsewhere classified - Plan: PT plan of care cert/re-cert  Abnormal posture - Plan: PT plan of care cert/re-cert  Malignant neoplasm of upper-inner quadrant of left breast in female, estrogen  receptor positive ([place]) - Plan: PT plan of care cert/re-cert  Aftercare following surgery for neoplasm - Plan: PT plan of care cert/re-cert     Problem List Patient Active Problem List   Diagnosis Date Noted  . Cancer of overlapping sites of left female breast ([place]) 10/01/2018  . Morbid obesity with BMI of 40.0-44.9, adult ([place]) 09/04/2018  . Malignant neoplasm of upper-inner quadrant of left breast in female, estrogen receptor positive ([place]) 09/02/2018  . Vitamin D deficiency 02/04/2018  . Urticaria 01/17/2018  . Muscle spasm 08/28/2017  . Weight gain 06/25/2017  . 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 Left hip pain 02/10/2017  . Anemia 01/21/2017  . Acute back pain 06/19/2016  . 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 Prediabetes 12/13/2015  . Sensation of lump in throat 12/13/2015  . Leg edema 12/13/2015  . Dense breasts 12/13/2011  . 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 Hypertension 11/10/2011   Annia Friendly, PT 10/21/18 3:37 PM  [place], [place], [address] Phone: [phone]   Fax:  [phone]  Name: Enola Siebers MRN: [id number] Date of Birth: 11/23/1965

## 2018-10-24 ENCOUNTER — Ambulatory Visit: Payer: BC Managed Care – PPO | Admitting: Physical Therapy

## 2018-10-24 ENCOUNTER — Other Ambulatory Visit: Payer: Self-pay | Admitting: Internal Medicine

## 2018-10-24 ENCOUNTER — Other Ambulatory Visit: Payer: Self-pay

## 2018-10-24 ENCOUNTER — Encounter: Payer: Self-pay | Admitting: Physical Therapy

## 2018-10-24 ENCOUNTER — Telehealth (INDEPENDENT_AMBULATORY_CARE_PROVIDER_SITE_OTHER): Payer: BC Managed Care – PPO | Admitting: Family Medicine

## 2018-10-24 ENCOUNTER — Encounter (INDEPENDENT_AMBULATORY_CARE_PROVIDER_SITE_OTHER): Payer: Self-pay | Admitting: Family Medicine

## 2018-10-24 DIAGNOSIS — E559 Vitamin D deficiency, unspecified: Secondary | ICD-10-CM

## 2018-10-24 DIAGNOSIS — Z6841 Body Mass Index (BMI) 40.0 and over, adult: Secondary | ICD-10-CM | POA: Diagnosis not present

## 2018-10-24 DIAGNOSIS — R7303 Prediabetes: Secondary | ICD-10-CM | POA: Diagnosis not present

## 2018-10-24 DIAGNOSIS — M25612 Stiffness of left shoulder, not elsewhere classified: Secondary | ICD-10-CM | POA: Diagnosis not present

## 2018-10-24 DIAGNOSIS — R293 Abnormal posture: Secondary | ICD-10-CM

## 2018-10-24 MED ORDER — METFORMIN HCL 500 MG PO TABS: 500.0000 mg | ORAL_TABLET | Freq: Every day | ORAL | 0 refills | Status: DC

## 2018-10-24 NOTE — Therapy (Signed)
[place], [place], [address] Phone: [phone]   Fax:  [phone]  Physical Therapy Treatment  Patient Details  Name: Roberta [name] MRN: [id number] Date of Birth: 07-Feb-1965 Referring Provider (PT): Dr. Fanny Skates   Encounter Date: 10/24/2018  PT End of Session - 10/24/18 0953    Visit Number  7    Number of Visits  9    Date for PT Re-Evaluation  11/18/18    PT Start Time  0901    PT Stop Time  0946    PT Time Calculation (min)  45 min    Activity Tolerance  Patient tolerated treatment well    Behavior During Therapy  [facility] for tasks assessed/performed       Past Medical History:  Diagnosis Date  . Back pain   . Cancer [facility])    multifocal left breast  . Depression   . Hip pain   . Hypertension   . Obesity   . PONV (postoperative nausea and vomiting)   . Prediabetes   . Swelling    bilat LE  . Wears glasses     Past Surgical History:  Procedure Laterality Date  . CHOLECYSTECTOMY    . COLONOSCOPY    . DILATION AND CURETTAGE OF UTERUS    . GASTRIC BYPASS    . KNEE SURGERY  2002   Ligament repair  . MASTECTOMY W/ SENTINEL NODE BIOPSY Left 10/01/2018   Procedure: LEFT TOTAL MASTECTOMY WITH SENTINEL LYMPH NODE BIOPSY AND BLUE DYE INJECTION;  Surgeon: Fanny Skates, MD;  Location: [place];  Service: General;  Laterality: Left;    There were no vitals filed for this visit.  Subjective Assessment - 10/24/18 0902    Subjective  My chest and arm are tight and I am very sleepy today. I think I overdid it yesterday because I was sitting at the computer most of the day yesterday.    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 Patient underwent a left mastectomy with sentinel node biopsy (0/5 nodes positive) on 10/01/2018. It is ER/PR positive and HER2 negative with a Ki67 of 10%. She had gastric bypass surgery in  2005, is pre-diabetic, and has hypertension.    Patient Stated Goals  Get my arm better    Currently in Pain?  No/denies    Pain Score  0-No pain                       [facility] Adult PT Treatment/Exercise - 10/24/18 0001      Shoulder Exercises: Pulleys   Flexion  2 minutes    Flexion Limitations  Verbal cues to work in painfree ROM    ABduction  2 minutes    ABduction Limitations  Verbal cues to work in painfree ROM      Shoulder Exercises: Therapy Ball   Flexion  10 reps;Both   with stretch at end range   ABduction  Left;10 reps      Manual Therapy   Passive ROM  to L shoulder in direction of flexion, abduction and ER to pt's tolerance                  PT Long Term Goals - 10/21/18 1532      PT LONG TERM GOAL #1   Title  Patient will be independent with a shoulder ROM HEP to improve ROM  Time  4    Period  Weeks    Status  On-going    Target Date  11/18/18      PT LONG TERM GOAL #2   Title  Patient will increase left shoulder active flexion ROM to >/= 140 degrees for increased ease reaching overhead and to return to baseline.    Baseline  86 taken post op    Time  4    Period  Weeks    Status  Revised    Target Date  11/18/18      PT LONG TERM GOAL #3   Title  Patient will increase left active shoulder abduction to >/= 140 degrees for increased ease reaching and to return to baseline.    Baseline  103 degrees post op    Time  4    Period  Weeks    Status  Revised    Target Date  11/18/18      PT LONG TERM GOAL #4   Title  Patient will improve her DASH score to be </= 10 for improved overall upper extremity function.    Baseline  61.36 post-op; 2.27 pre-op    Time  4    Period  Weeks    Status  New    Target Date  11/18/18      PT LONG TERM GOAL #5   Title  Patient will verbalize good understanding of lymphedema risk reduction practices.    Time  4    Period  Weeks    Status  New    Target Date  11/18/18            Plan  - 10/24/18 0953    Clinical Impression Statement  Began PROM to L shoulder today. Pt was very tight when she arrived but felt much better by end of session. Her PROM improved greatly by end of session. Also continued with AAROM to improve L shoulder ROM.    PT Frequency  2x / week    PT Duration  4 weeks    PT Treatment/Interventions  ADLs/Self Care Home Management;Therapeutic exercise;Manual techniques;Patient/family education;Moist Heat;Passive range of motion;Scar mobilization    PT Next Visit Plan  Pulleys, PROM, ROM exercises    PT Home Exercise Plan  Post op shoulder ROM HEP    Consulted and Agree with Plan of Care  Patient       Patient will benefit from skilled therapeutic intervention in order to improve the following deficits and impairments:  Postural dysfunction, Decreased range of motion, Decreased knowledge of precautions, Impaired UE functional use, Pain, Increased fascial restricitons, Decreased strength  Visit Diagnosis: Stiffness of left shoulder, not elsewhere classified  Abnormal posture     Problem List Patient Active Problem List   Diagnosis Date Noted  . Cancer of overlapping sites of left female breast ([place]) 10/01/2018  . Morbid obesity with BMI of 40.0-44.9, adult ([place]) 09/04/2018  . Malignant neoplasm of upper-inner quadrant of left breast in female, estrogen receptor positive ([place]) 09/02/2018  . Vitamin D deficiency 02/04/2018  . Urticaria 01/17/2018  . Muscle spasm 08/28/2017  . Weight gain 06/25/2017  . 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 Left hip pain 02/10/2017  . Anemia 01/21/2017  . Acute back pain 06/19/2016  . 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 Prediabetes 12/13/2015  . Sensation of lump in throat 12/13/2015  . Leg edema 12/13/2015  . Dense breasts 12/13/2011  . 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 Hypertension 11/10/2011  [name] [facility] 10/24/2018, 9:55 AM  [place], [place], [address] Phone: [phone]   Fax:  [phone]  Name: Roberta [name] MRN: [id number] Date of Birth: [birth year]  Manus Gunning, PT 10/24/18 9:55 AM

## 2018-10-27 NOTE — Progress Notes (Signed)
Subjective:    Patient ID: Roberta [name], female    DOB: 1965-08-14, 53 y.o.   MRN: [id number]  HPI The patient is here for follow up.  She is exercising regularly - walking.  She is following with the healthy weight management clinic and actively working on weight loss.  Breast Cancer:  She was diagnosed with left breast cancer 8/27 and had a left mastectomy with LN dissection on 10/01/18.  She is still recovering from surgery, but overall doing well.  She is doing physical therapy for her chest wall.  She is hoping to have reconstructive surgery in the summer, but wants to lose weight prior to that.  Hypertension: She is taking her medication daily. She is compliant with a low sodium diet.  She denies chest pain except for what is musculoskeletal from her surgery, palpitations, edema, shortness of breath and regular headaches.     Prediabetes:  She is compliant with a low sugar/carbohydrate diet.  She is walking regularly.  She is taking sertraline at night.  This was started just before her surgery and she thinks it is helping.  The dose is adequate.  It is helping her deal with the diagnosis of breast cancer and going through the surgery.  Medications and allergies reviewed with patient and updated if appropriate.  Patient Active Problem List   Diagnosis Date Noted  . Cancer of overlapping sites of left female breast ([place]) 10/01/2018  . Morbid obesity with BMI of 40.0-44.9, adult ([place]) 09/04/2018  . Malignant neoplasm of upper-inner quadrant of left breast in female, estrogen receptor positive ([place]) 09/02/2018  . Vitamin D deficiency 02/04/2018  . Urticaria 01/17/2018  . Muscle spasm 08/28/2017  . Weight gain 06/25/2017  . 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 Left hip pain 02/10/2017  . Anemia 01/21/2017  . Acute back pain 06/19/2016  . 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 Prediabetes 12/13/2015  . Sensation of lump in throat 12/13/2015  . Leg edema 12/13/2015  . Dense  breasts 12/13/2011  . 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 Hypertension 11/10/2011    Current Outpatient Medications on File Prior to Visit  Medication Sig Dispense Refill  . Biotin 10000 MCG TABS Take 10,000 mcg by mouth at bedtime.     . Cholecalciferol (VITAMIN D-3) 5000 units TABS Take 1 tablet by mouth daily. (Patient taking differently: Take 5,000 Units by mouth daily. ) 30 tablet   . losartan-hydrochlorothiazide (HYZAAR) 100-25 MG tablet Take 1 tablet by mouth daily. Overdue for annual appt must see provider for future refills 30 tablet 0  . metFORMIN (GLUCOPHAGE) 500 MG tablet Take 1 tablet (500 mg total) by mouth daily with breakfast. 30 tablet 0  . Multiple Vitamin (MULTIVITAMIN) tablet Take 1 tablet by mouth daily.    . Omega-3 Fatty Acids (OMEGA 3 500 PO) Take 500 mg by mouth daily.     . sertraline (ZOLOFT) 25 MG tablet Take 25 mg by mouth daily.    . vitamin E 100 UNIT capsule Take 180 Units by mouth at bedtime.     Marland Kitchen zinc gluconate 50 MG tablet Take 50 mg by mouth at bedtime.      No current facility-administered medications on file prior to visit.     Past Medical History:  Diagnosis Date  . Back pain   . Cancer [facility])    multifocal left breast  . Depression   . Hip pain   . Hypertension   . Obesity   .  PONV (postoperative nausea and vomiting)   . Prediabetes   . Swelling    bilat LE  . Wears glasses     Past Surgical History:  Procedure Laterality Date  . CHOLECYSTECTOMY    . COLONOSCOPY    . DILATION AND CURETTAGE OF UTERUS    . GASTRIC BYPASS    . KNEE SURGERY  2002   Ligament repair  . MASTECTOMY W/ SENTINEL NODE BIOPSY Left 10/01/2018   Procedure: LEFT TOTAL MASTECTOMY WITH SENTINEL LYMPH NODE BIOPSY AND BLUE DYE INJECTION;  Surgeon: Fanny Skates, MD;  Location: [place];  Service: General;  Laterality: Left;    Social History   Socioeconomic History  . Marital status: Legally Separated    Spouse name: Not on file  . Number of  children: Not on file  . Years of education: Not on file  . Highest education level: Not on file  Occupational History  . Not on file  Social Needs  . Financial resource strain: Not on file  . Food insecurity    Worry: Not on file    Inability: Not on file  . Transportation needs    Medical: Not on file    Non-medical: Not on file  Tobacco Use  . Smoking status: Never Smoker  . Smokeless tobacco: Never Used  Substance and Sexual Activity  . Alcohol use: Yes    Comment: occasional wine  . Drug use: No  . Sexual activity: Not Currently  Lifestyle  . Physical activity    Days per week: Not on file    Minutes per session: Not on file  . Stress: Not on file  Relationships  . Social Herbalist on phone: Not on file    Gets together: Not on file    Attends religious service: Not on file    Active member of club or organization: Not on file    Attends meetings of clubs or organizations: Not on file    Relationship status: Not on file  Other Topics Concern  . Not on file  Social History Narrative  . Not on file    Family History  Problem Relation Age of Onset  . Diabetes Mother   . Hypertension Mother   . Heart disease Mother   . Stroke Mother   . Kidney disease Mother   . Obesity Mother   . Cancer Maternal Aunt        lung  . Cancer Maternal Uncle        lung  . Throat cancer Maternal Aunt     Review of Systems  Constitutional: Negative for chills and fever.  Respiratory: Negative for cough, shortness of breath and wheezing.   Cardiovascular: Positive for chest pain (chest wall pain from mastectomy). Negative for palpitations and leg swelling.  Gastrointestinal: Negative for abdominal pain, constipation and nausea.  Neurological: Negative for dizziness, light-headedness and headaches.       Objective:   Vitals:   10/28/18 1058  BP: 114/78  Pulse: 80  Temp: 98.3 F (36.8 C)  SpO2: 99%   BP Readings from Last 3 Encounters:  10/28/18 114/78   10/02/18 122/83  09/25/18 132/78   Wt Readings from Last 3 Encounters:  10/28/18 268 lb 6.4 oz (121.7 kg)  10/01/18 261 lb 8 oz (118.6 kg)  09/25/18 266 lb 14.4 oz (121.1 kg)   Body mass index is 43.32 kg/m.   Physical Exam    Constitutional: Appears well-developed and well-nourished. No distress.  HENT:  Head: Normocephalic and atraumatic.  Neck: Neck supple. No tracheal deviation present. No thyromegaly present.  No cervical lymphadenopathy Cardiovascular: Normal rate, regular rhythm and normal heart sounds.   No murmur heard. No carotid bruit .  No edema Pulmonary/Chest: Effort normal and breath sounds normal. No respiratory distress. No has no wheezes. No rales.  Skin: Skin is warm and dry. Not diaphoretic.  Psychiatric: Normal mood and affect. Behavior is normal.      Assessment & Plan:    See Problem List for Assessment and Plan of chronic medical problems.

## 2018-10-27 NOTE — Patient Instructions (Addendum)
  Flu immunization administered today.     Medications reviewed and updated.  Changes include :   none     Please followup in 6 months   

## 2018-10-28 ENCOUNTER — Encounter: Payer: Self-pay | Admitting: Internal Medicine

## 2018-10-28 ENCOUNTER — Other Ambulatory Visit: Payer: Self-pay

## 2018-10-28 ENCOUNTER — Ambulatory Visit (INDEPENDENT_AMBULATORY_CARE_PROVIDER_SITE_OTHER): Payer: BC Managed Care – PPO | Admitting: Internal Medicine

## 2018-10-28 VITALS — BP 114/78 | HR 80 | Temp 98.3°F | Ht 66.0 in | Wt 268.4 lb

## 2018-10-28 DIAGNOSIS — R7303 Prediabetes: Secondary | ICD-10-CM | POA: Diagnosis not present

## 2018-10-28 DIAGNOSIS — Z23 Encounter for immunization: Secondary | ICD-10-CM

## 2018-10-28 DIAGNOSIS — I10 Essential (primary) hypertension: Secondary | ICD-10-CM | POA: Diagnosis not present

## 2018-10-28 DIAGNOSIS — C50812 Malignant neoplasm of overlapping sites of left female breast: Secondary | ICD-10-CM

## 2018-10-28 DIAGNOSIS — Z6841 Body Mass Index (BMI) 40.0 and over, adult: Secondary | ICD-10-CM

## 2018-10-28 DIAGNOSIS — Z170 Estrogen receptor positive status [ER+]: Secondary | ICD-10-CM

## 2018-10-28 NOTE — Assessment & Plan Note (Signed)
Following with a healthy weight management clinic She is walking regularly She has revised her diet and is trying to add in more protein

## 2018-10-28 NOTE — Assessment & Plan Note (Signed)
Following with oncology, surgery and currently doing physical therapy Still recovering from mastectomy Overall feels she is doing well with everything-continue sertraline 25 mg daily

## 2018-10-28 NOTE — Assessment & Plan Note (Signed)
BP well controlled Current regimen effective and well tolerated Continue current medications at current doses  

## 2018-10-28 NOTE — Assessment & Plan Note (Signed)
Lab Results  Component Value Date   HGBA1C 6.0 (H) 09/25/2018   Actively working on weight loss Compliant with a low sugar/carbohydrate diet She is walking for exercise No blood work needed today

## 2018-10-29 ENCOUNTER — Ambulatory Visit: Payer: BC Managed Care – PPO

## 2018-10-29 DIAGNOSIS — Z483 Aftercare following surgery for neoplasm: Secondary | ICD-10-CM

## 2018-10-29 DIAGNOSIS — M25612 Stiffness of left shoulder, not elsewhere classified: Secondary | ICD-10-CM | POA: Diagnosis not present

## 2018-10-29 DIAGNOSIS — C50212 Malignant neoplasm of upper-inner quadrant of left female breast: Secondary | ICD-10-CM

## 2018-10-29 DIAGNOSIS — R293 Abnormal posture: Secondary | ICD-10-CM

## 2018-10-29 DIAGNOSIS — Z170 Estrogen receptor positive status [ER+]: Secondary | ICD-10-CM

## 2018-10-29 NOTE — Therapy (Signed)
[place], [place], [address] Phone: [phone]   Fax:  [phone]  Physical Therapy Treatment  Patient Details  Name: Roberta [name] MRN: [id number] Date of Birth: 12-10-1965 Referring Provider (PT): Dr. Fanny Skates   Encounter Date: 10/29/2018  PT End of Session - 10/29/18 0901    Visit Number  8    Number of Visits  9    Date for PT Re-Evaluation  11/18/18    PT Start Time  0900    PT Stop Time  0955    PT Time Calculation (min)  55 min    Activity Tolerance  Patient tolerated treatment well    Behavior During Therapy  [facility] for tasks assessed/performed       Past Medical History:  Diagnosis Date  . Back pain   . Cancer [facility])    multifocal left breast  . Depression   . Hip pain   . Hypertension   . Obesity   . PONV (postoperative nausea and vomiting)   . Prediabetes   . Swelling    bilat LE  . Wears glasses     Past Surgical History:  Procedure Laterality Date  . CHOLECYSTECTOMY    . COLONOSCOPY    . DILATION AND CURETTAGE OF UTERUS    . GASTRIC BYPASS    . KNEE SURGERY  2002   Ligament repair  . MASTECTOMY W/ SENTINEL NODE BIOPSY Left 10/01/2018   Procedure: LEFT TOTAL MASTECTOMY WITH SENTINEL LYMPH NODE BIOPSY AND BLUE DYE INJECTION;  Surgeon: Fanny Skates, MD;  Location: [place];  Service: General;  Laterality: Left;    There were no vitals filed for this visit.  Subjective Assessment - 10/29/18 0902    Subjective  Pt states that her chest and arm are feeling less tight today. She states that she has been working on her exercises at home. She reports that she is going to the MD on Thursday.    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 Patient underwent a left mastectomy with sentinel node biopsy (0/5 nodes positive) on 10/01/2018. It is ER/PR positive and HER2 negative with a Ki67 of 10%. She had  gastric bypass surgery in 2005, is pre-diabetic, and has hypertension.    Patient Stated Goals  Get my arm better    Currently in Pain?  No/denies    Pain Score  0-No pain    Multiple Pain Sites  No                       [facility] Adult PT Treatment/Exercise - 10/29/18 0001      Shoulder Exercises: Supine   Protraction  AAROM;Both;10 reps    Protraction Limitations  VC and demonstration for movement.     External Rotation  AAROM;10 reps    External Rotation Limitations  tactile cueing/VC for movement to avoid elbow extension compensation and to avoid pain only limited mobility and very light stretching.  Pt requires consistent VC to prevent elbow extension compensation.     Flexion  AAROM;Both;15 reps    Flexion Limitations  VC to avoid pain and to get a very light stretch    Other Supine Exercises  Chest press 10x VC to extend the L elbow for improved mobility.     Other Supine Exercises  Supine External rotation stretch 2x 30 seconds w/hands behind head, visual on scar with no tension  at the incision site.       Shoulder Exercises: Standing   Flexion  AAROM;Left;10 reps    Flexion Limitations  5 second hold tactile cueing to prevent compensation w/trunk side bending.     ABduction  AAROM;Left;10 reps    ABduction Limitations  5 second hold, tactile cueing for positioning to prevent compensation with rotation.       Shoulder Exercises: Pulleys   Flexion  2 minutes    Flexion Limitations  Verbal cues to work in painfree ROM    ABduction  2 minutes    ABduction Limitations  Verbal cues to work in painfree ROM      Manual Therapy   Manual Therapy  Soft tissue mobilization;Myofascial release;Joint mobilization;Manual Lymphatic Drainage (MLD);Passive ROM    Joint Mobilization  Grade III posterior/inferior shouler mobs for joint capsule stretch and fluid exchange in the L glenohumeral joint.     Soft tissue mobilization  Light STM to the L upper trapezius, deltoid and  superior pectoralis major over incision site. Palpable tightness/tenderness decreased minimally w/light STM    Myofascial Release  Over the superior portion of the L pectoralis major avoiding tension on incision site at this time.     Manual Lymphatic Drainage (MLD)  Bil axillary nodes, inguinal nodes, anterior inter-axillary anastomosis and L axillo-inguinal anastomosis followed by deep breathing in hook lying.     Passive ROM  In all directions for the L shoulder flexion, abduction, IR, ER with oscillations and traction              PT Education - 10/29/18 0926    Education Details  Continue with ROM exercises at home. Avoid pain and aggressive stretching. Discussed lymphedema and lymphatic massage during MLD.    Person(s) Educated  Patient    Methods  Explanation    Comprehension  Verbalized understanding          PT Long Term Goals - 10/21/18 1532      PT LONG TERM GOAL #1   Title  Patient will be independent with a shoulder ROM HEP to improve ROM    Time  4    Period  Weeks    Status  On-going    Target Date  11/18/18      PT LONG TERM GOAL #2   Title  Patient will increase left shoulder active flexion ROM to >/= 140 degrees for increased ease reaching overhead and to return to baseline.    Baseline  86 taken post op    Time  4    Period  Weeks    Status  Revised    Target Date  11/18/18      PT LONG TERM GOAL #3   Title  Patient will increase left active shoulder abduction to >/= 140 degrees for increased ease reaching and to return to baseline.    Baseline  103 degrees post op    Time  4    Period  Weeks    Status  Revised    Target Date  11/18/18      PT LONG TERM GOAL #4   Title  Patient will improve her DASH score to be </= 10 for improved overall upper extremity function.    Baseline  61.36 post-op; 2.27 pre-op    Time  4    Period  Weeks    Status  New    Target Date  11/18/18      PT LONG TERM GOAL #5  Title  Patient will verbalize good  understanding of lymphedema risk reduction practices.    Time  4    Period  Weeks    Status  New    Target Date  11/18/18            Plan - 10/29/18 0901    Clinical Impression Statement  Pt presents to physical therapy with continued tightness in her L shoulder. She was able to progress PROM/AAROM activities this session w/o an increase in pain from baseline. Discussed the importance of pain-free movement. Papable tightness/tenderness noted at the L upper trapezius, deltoid and pectoralis major; decreased slightly following light STM and myofascial release over the superior portion of pectoralis major. MLD following STM in order to facilitate lymphatic fluid flow. PROM performed to the L shoulder following joint mobs and light oscillations w/distraction in order to promote relaxation. Pt will benefit from continued POC.    Rehab Potential  Excellent    PT Frequency  2x / week    PT Duration  4 weeks    PT Treatment/Interventions  ADLs/Self Care Home Management;Therapeutic exercise;Manual techniques;Patient/family education;Moist Heat;Passive range of motion;Scar mobilization    PT Next Visit Plan  Assess HEP and PROM continue to progress as needed.    PT Home Exercise Plan  Post op shoulder ROM HEP    Consulted and Agree with Plan of Care  Patient       Patient will benefit from skilled therapeutic intervention in order to improve the following deficits and impairments:  Postural dysfunction, Decreased range of motion, Decreased knowledge of precautions, Impaired UE functional use, Pain, Increased fascial restricitons, Decreased strength  Visit Diagnosis: Stiffness of left shoulder, not elsewhere classified  Abnormal posture  Malignant neoplasm of upper-inner quadrant of left breast in female, estrogen receptor positive [facility])  Aftercare following surgery for neoplasm     Problem List Patient Active Problem List   Diagnosis Date Noted  . Cancer of overlapping sites of left  female breast ([place]) 10/01/2018  . Morbid obesity with BMI of 40.0-44.9, adult ([place]) 09/04/2018  . Malignant neoplasm of upper-inner quadrant of left breast in female, estrogen receptor positive ([place]) 09/02/2018  . Vitamin D deficiency 02/04/2018  . Urticaria 01/17/2018  . 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 Left hip pain 02/10/2017  . Anemia 01/21/2017  . 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 Prediabetes 12/13/2015  . Sensation of lump in throat 12/13/2015  . Leg edema 12/13/2015  . Dense breasts 12/13/2011  . 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 Hypertension 11/10/2011    Ander Purpura, PT 10/29/2018, 9:59 AM  [place], [place], [address] Phone: [phone]   Fax:  [phone]  Name: Roberta [name] MRN: [id number] Date of Birth: 1965-03-14

## 2018-10-29 NOTE — Progress Notes (Signed)
Office: [phone]  /  Fax: [phone] TeleHealth Visit:  Roberta [name] has verbally consented to this TeleHealth visit today. The patient is located at home, the provider is located at the [employer] [facility] office. The participants in this visit include the listed provider and patient and any and all parties involved. The visit was conducted today via WebEx.  HPI:   Chief Complaint: OBESITY Roberta [name] is here to discuss her progress with her obesity treatment plan. She is on the Category 2 plan and is following her eating plan approximately 60 % of the time. She states she is walking 30 minutes 1 time per week. Roberta [name] had a mastectomy on 10/01/18. Her appetite is decreased. She is on the plan at breakfast and lunch, but sometimes she skips dinner due to lack of appetite.. She is doing very well with her recovery and she is only taking tylenol for pain. She will need no further treatment other than an estrogen blocker. We were unable to weigh the patient today for this TeleHealth visit. She has lost 30 lbs since starting treatment with [place] (weight not reported).  Pre-Diabetes Roberta [name] has a diagnosis of prediabetes based on her elevated Hgb A1c and was informed this puts her at greater risk of developing diabetes. She is on metformin daily and her last A1c was at 6.0 on 09/25/18. She takes metformin in the evening with food. Roberta [name] continues to work on diet and exercise to decrease risk of diabetes. She denies nausea or hypoglycemia. Lab Results  Component Value Date   HGBA1C 6.0 (H) 09/25/2018    At risk for diabetes Roberta [name] is at higher than average risk for developing diabetes due to her obesity and prediabetes. She currently denies polyuria or polydipsia.  Vitamin D deficiency Roberta [name] has a diagnosis of vitamin D deficiency. Her vitamin D level is at goal (70.2 on 07/17/18). She is currently taking vit D and she denies nausea, vomiting or muscle weakness.  ASSESSMENT AND PLAN:   Prediabetes - Plan: metFORMIN (GLUCOPHAGE) 500 MG tablet  Vitamin D deficiency  Class 3 severe obesity with serious comorbidity and body mass index (BMI) of 40.0 to 44.9 in adult, unspecified obesity type ([place])  PLAN:  Pre-Diabetes Roberta [name] will continue to work on weight loss, exercise, and decreasing simple carbohydrates in her diet to help decrease the risk of diabetes. We dicussed metformin including benefits and risks. She was informed that eating too many simple carbohydrates or too many calories at one sitting increases the likelihood of GI side effects. Roberta [name] agrees to continue metformin 500 mg daily #30 with no refills and follow up with [place] as directed to monitor her progress. [place] may leave off doses of metformin, if she is unable to eat with it.  Diabetes risk counseling Roberta [name] was given extended (15 minutes) diabetes prevention counseling today. She is 53 y.o. female and has risk factors for diabetes including obesity and prediabetes. We discussed intensive lifestyle modifications today with an emphasis on weight loss as well as increasing exercise and decreasing simple carbohydrates in her diet.  Vitamin D Deficiency Roberta [name] was informed that low vitamin D levels contributes to fatigue and are associated with obesity, breast, and colon cancer. Roberta [name] will continue to take OTC Vit D3 @5 ,000 IU daily and she will follow up for routine testing of vitamin D, at least 2-3 times per year. She was informed of the risk of over-replacement of vitamin D and agrees to not increase her dose unless she discusses this with  [place] first.  Obesity Roberta [name] is currently in the action stage of change. As such, her goal is to continue with weight loss efforts She has agreed to follow the Category 2 plan Roberta [name] will continue walking 30 minutes 1 time per week for weight loss and overall health benefits. We discussed the following Behavioral Modification Strategies today: planning for success, increase H2O intake  and increasing lean protein intake  Roberta [name] will work on increasing protein in the evening.  Roberta [name] has agreed to follow up with our clinic in 3 weeks. She was informed of the importance of frequent follow up visits to maximize her success with intensive lifestyle modifications for her multiple health conditions.  ALLERGIES: Allergies  Allergen Reactions  . Lisinopril Swelling    Swelling of the tongue  . Penicillins     UNSPECIFIED CHILDHOOD REACTION  . Norvasc [Amlodipine Besylate] Rash  . Red Dye Rash  . Tetanus Toxoids Swelling    Swelling at site of injection    MEDICATIONS: Current Outpatient Medications on File Prior to Visit  Medication Sig Dispense Refill  . Biotin 10000 MCG TABS Take 10,000 mcg by mouth at bedtime.     . Cholecalciferol (VITAMIN D-3) 5000 units TABS Take 1 tablet by mouth daily. (Patient taking differently: Take 5,000 Units by mouth daily. ) 30 tablet   . Multiple Vitamin (MULTIVITAMIN) tablet Take 1 tablet by mouth daily.    . Omega-3 Fatty Acids (OMEGA 3 500 PO) Take 500 mg by mouth daily.     . vitamin E 100 UNIT capsule Take 180 Units by mouth at bedtime.     Marland Kitchen zinc gluconate 50 MG tablet Take 50 mg by mouth at bedtime.      No current facility-administered medications on file prior to visit.     PAST MEDICAL HISTORY: Past Medical History:  Diagnosis Date  . Back pain   . Cancer [name])    multifocal left breast  . Depression   . Hip pain   . Hypertension   . Obesity   . PONV (postoperative nausea and vomiting)   . Prediabetes   . Swelling    bilat LE  . Wears glasses     PAST SURGICAL HISTORY: Past Surgical History:  Procedure Laterality Date  . CHOLECYSTECTOMY    . COLONOSCOPY    . DILATION AND CURETTAGE OF UTERUS    . GASTRIC BYPASS    . KNEE SURGERY  2002   Ligament repair  . MASTECTOMY W/ SENTINEL NODE BIOPSY Left 10/01/2018   Procedure: LEFT TOTAL MASTECTOMY WITH SENTINEL LYMPH NODE BIOPSY AND BLUE DYE INJECTION;  Surgeon:  Fanny Skates, MD;  Location: [place];  Service: General;  Laterality: Left;    SOCIAL HISTORY: Social History   Tobacco Use  . Smoking status: Never Smoker  . Smokeless tobacco: Never Used  Substance Use Topics  . Alcohol use: Yes    Comment: occasional wine  . Drug use: No    FAMILY HISTORY: Family History  Problem Relation Age of Onset  . Diabetes Mother   . Hypertension Mother   . Heart disease Mother   . Stroke Mother   . Kidney disease Mother   . Obesity Mother   . Cancer Maternal Aunt        lung  . Cancer Maternal Uncle        lung  . Throat cancer Maternal Aunt     ROS: Review of Systems  Gastrointestinal: Negative for nausea and vomiting.  Genitourinary:  Negative for frequency.  Musculoskeletal:       Negative for muscle weakness  Endo/Heme/Allergies: Negative for polydipsia.       Negative for hypoglycemia    PHYSICAL EXAM: Pt in no acute distress  RECENT LABS AND TESTS: BMET    Component Value Date/Time   NA 137 09/25/2018 0904   NA 139 07/17/2018 1547   K 3.5 09/25/2018 0904   CL 102 09/25/2018 0904   CO2 28 09/25/2018 0904   GLUCOSE 112 (H) 09/25/2018 0904   BUN 15 09/25/2018 0904   BUN 16 07/17/2018 1547   CREATININE 1.03 (H) 10/01/2018 1255   CREATININE 0.95 09/04/2018 0840   CALCIUM 8.8 (L) 09/25/2018 0904   GFRNONAA >60 10/01/2018 1255   GFRNONAA >60 09/04/2018 0840   GFRAA >60 10/01/2018 1255   GFRAA >60 09/04/2018 0840   Lab Results  Component Value Date   HGBA1C 6.0 (H) 09/25/2018   HGBA1C 5.9 (H) 07/17/2018   HGBA1C 6.1 (H) 01/31/2018   HGBA1C 6.0 (H) 10/08/2017   HGBA1C 6.3 06/25/2017   Lab Results  Component Value Date   INSULIN 13.4 01/31/2018   INSULIN 10.6 10/08/2017   CBC    Component Value Date/Time   WBC 12.1 (H) 10/01/2018 1255   RBC 4.53 10/01/2018 1255   HGB 12.2 10/01/2018 1255   HGB 11.8 (L) 09/04/2018 0840   HGB 12.3 07/17/2018 1547   HCT 36.9 10/01/2018 1255   HCT 38.5 07/17/2018 1547   PLT 222  10/01/2018 1255   PLT 260 09/04/2018 0840   MCV 81.5 10/01/2018 1255   MCV 83 07/17/2018 1547   MCH 26.9 10/01/2018 1255   MCHC 33.1 10/01/2018 1255   RDW 14.4 10/01/2018 1255   RDW 14.9 07/17/2018 1547   LYMPHSABS 1.5 09/25/2018 0904   LYMPHSABS 1.4 07/17/2018 1547   MONOABS 0.5 09/25/2018 0904   EOSABS 0.0 09/25/2018 0904   EOSABS 0.1 07/17/2018 1547   BASOSABS 0.0 09/25/2018 0904   BASOSABS 0.0 07/17/2018 1547   Iron/TIBC/Ferritin/ %Sat    Component Value Date/Time   IRON 162 (H) 01/22/2017 1152   FERRITIN 14.5 01/22/2017 1152   Lipid Panel     Component Value Date/Time   CHOL 112 10/08/2017 1239   TRIG 69 10/08/2017 1239   HDL 50 10/08/2017 1239   CHOLHDL 2 06/25/2017 1541   VLDL 15.0 06/25/2017 1541   LDLCALC 48 10/08/2017 1239   Hepatic Function Panel     Component Value Date/Time   PROT 6.9 09/04/2018 0840   PROT 6.9 07/17/2018 1547   ALBUMIN 3.0 (L) 09/04/2018 0840   ALBUMIN 3.6 (L) 07/17/2018 1547   AST 13 (L) 09/04/2018 0840   ALT 10 09/04/2018 0840   ALKPHOS 65 09/04/2018 0840   BILITOT 0.4 09/04/2018 0840      Component Value Date/Time   TSH 1.480 10/08/2017 1239   TSH 1.24 06/25/2017 1541   TSH 1.53 07/11/2016 1150     Ref. Range 07/17/2018 15:47  Vitamin D, 25-Hydroxy Latest Ref Range: 30.0 - 100.0 ng/mL 70.2    I, Doreene Nest, am acting as Location manager for [employer], FNP-C  I have reviewed the above documentation for accuracy and completeness, and I agree with the above.  - Kemari Mares, FNP-C.

## 2018-10-31 ENCOUNTER — Inpatient Hospital Stay: Payer: BC Managed Care – PPO

## 2018-10-31 ENCOUNTER — Inpatient Hospital Stay: Payer: BC Managed Care – PPO | Attending: Oncology | Admitting: Adult Health

## 2018-10-31 ENCOUNTER — Encounter: Payer: Self-pay | Admitting: Adult Health

## 2018-10-31 ENCOUNTER — Other Ambulatory Visit: Payer: Self-pay

## 2018-10-31 VITALS — BP 142/79 | HR 72 | Temp 98.0°F | Resp 18 | Ht 66.0 in | Wt 268.3 lb

## 2018-10-31 DIAGNOSIS — Z9884 Bariatric surgery status: Secondary | ICD-10-CM | POA: Insufficient documentation

## 2018-10-31 DIAGNOSIS — Z801 Family history of malignant neoplasm of trachea, bronchus and lung: Secondary | ICD-10-CM | POA: Insufficient documentation

## 2018-10-31 DIAGNOSIS — Z9012 Acquired absence of left breast and nipple: Secondary | ICD-10-CM | POA: Insufficient documentation

## 2018-10-31 DIAGNOSIS — E669 Obesity, unspecified: Secondary | ICD-10-CM | POA: Insufficient documentation

## 2018-10-31 DIAGNOSIS — Z170 Estrogen receptor positive status [ER+]: Secondary | ICD-10-CM

## 2018-10-31 DIAGNOSIS — F329 Major depressive disorder, single episode, unspecified: Secondary | ICD-10-CM | POA: Diagnosis not present

## 2018-10-31 DIAGNOSIS — I10 Essential (primary) hypertension: Secondary | ICD-10-CM | POA: Diagnosis not present

## 2018-10-31 DIAGNOSIS — Z79899 Other long term (current) drug therapy: Secondary | ICD-10-CM | POA: Insufficient documentation

## 2018-10-31 DIAGNOSIS — C50212 Malignant neoplasm of upper-inner quadrant of left female breast: Secondary | ICD-10-CM | POA: Diagnosis present

## 2018-10-31 DIAGNOSIS — Z6841 Body Mass Index (BMI) 40.0 and over, adult: Secondary | ICD-10-CM

## 2018-10-31 DIAGNOSIS — Z7984 Long term (current) use of oral hypoglycemic drugs: Secondary | ICD-10-CM | POA: Insufficient documentation

## 2018-10-31 LAB — CBC WITH DIFFERENTIAL/PLATELET
Abs Immature Granulocytes: 0 10*3/uL (ref 0.00–0.07)
Basophils Absolute: 0 10*3/uL (ref 0.0–0.1)
Basophils Relative: 1 %
Eosinophils Absolute: 0.1 10*3/uL (ref 0.0–0.5)
Eosinophils Relative: 2 %
HCT: 36.2 % (ref 36.0–46.0)
Hemoglobin: 11.7 g/dL — ABNORMAL LOW (ref 12.0–15.0)
Immature Granulocytes: 0 %
Lymphocytes Relative: 30 %
Lymphs Abs: 1.5 10*3/uL (ref 0.7–4.0)
MCH: 26.5 pg (ref 26.0–34.0)
MCHC: 32.3 g/dL (ref 30.0–36.0)
MCV: 82.1 fL (ref 80.0–100.0)
Monocytes Absolute: 0.6 10*3/uL (ref 0.1–1.0)
Monocytes Relative: 13 %
Neutro Abs: 2.6 10*3/uL (ref 1.7–7.7)
Neutrophils Relative %: 54 %
Platelets: 275 10*3/uL (ref 150–400)
RBC: 4.41 MIL/uL (ref 3.87–5.11)
RDW: 14.7 % (ref 11.5–15.5)
WBC: 4.8 10*3/uL (ref 4.0–10.5)
nRBC: 0 % (ref 0.0–0.2)

## 2018-10-31 LAB — COMPREHENSIVE METABOLIC PANEL
ALT: 13 U/L (ref 0–44)
AST: 17 U/L (ref 15–41)
Albumin: 3.2 g/dL — ABNORMAL LOW (ref 3.5–5.0)
Alkaline Phosphatase: 79 U/L (ref 38–126)
Anion gap: 8 (ref 5–15)
BUN: 15 mg/dL (ref 6–20)
CO2: 26 mmol/L (ref 22–32)
Calcium: 9 mg/dL (ref 8.9–10.3)
Chloride: 103 mmol/L (ref 98–111)
Creatinine, Ser: 0.84 mg/dL (ref 0.44–1.00)
GFR calc Af Amer: >60 mL/min (ref >60)
GFR calc non Af Amer: >60 mL/min (ref >60)
Glucose, Bld: 88 mg/dL (ref 70–99)
Potassium: 3.6 mmol/L (ref 3.5–5.1)
Sodium: 137 mmol/L (ref 135–145)
Total Bilirubin: 0.4 mg/dL (ref 0.3–1.2)
Total Protein: 7.2 g/dL (ref 6.5–8.1)

## 2018-10-31 MED ORDER — TAMOXIFEN CITRATE 20 MG PO TABS: 20.0000 mg | ORAL_TABLET | Freq: Every day | ORAL | 3 refills | Status: DC

## 2018-10-31 NOTE — Patient Instructions (Signed)
Tamoxifen oral tablet What is this medicine? TAMOXIFEN (ta MOX i fen) blocks the effects of estrogen. It is commonly used to treat breast cancer. It is also used to decrease the chance of breast cancer coming back in women who have received treatment for the disease. It may also help prevent breast cancer in women who have a high risk of developing breast cancer. This medicine may be used for other purposes; ask your health care provider or pharmacist if you have questions. COMMON BRAND NAME(S): Nolvadex What should I tell my health care provider before I take this medicine? They need to know if you have any of these conditions:  blood clots  blood disease  cataracts or impaired eyesight  endometriosis  high calcium levels  high cholesterol  irregular menstrual cycles  liver disease  stroke  uterine fibroids  an unusual reaction to tamoxifen, other medicines, foods, dyes, or preservatives  pregnant or trying to get pregnant  breast-feeding How should I use this medicine? Take this medicine by mouth with a glass of water. Follow the directions on the prescription label. You can take it with or without food. Take your medicine at regular intervals. Do not take your medicine more often than directed. Do not stop taking except on your doctor's advice. A special MedGuide will be given to you by the pharmacist with each prescription and refill. Be sure to read this information carefully each time. Talk to your pediatrician regarding the use of this medicine in children. While this drug may be prescribed for selected conditions, precautions do apply. Overdosage: If you think you have taken too much of this medicine contact a poison control center or emergency room at once. NOTE: This medicine is only for you. Do not share this medicine with others. What if I miss a dose? If you miss a dose, take it as soon as you can. If it is almost time for your next dose, take only that dose. Do  not take double or extra doses. What may interact with this medicine? Do not take this medicine with any of the following medications:  cisapride  certain medicines for irregular heart beat like dronedarone, quinidine  certain medicines for fungal infection like fluconazole, posaconazole  pimozide  saquinavir  thioridazine This medicine may also interact with the following medications:  aminoglutethimide  anastrozole  bromocriptine  chemotherapy drugs  dofetilide  female hormones, like estrogens and birth control pills  letrozole  medroxyprogesterone  phenobarbital  rifampin  warfarin This list may not describe all possible interactions. Give your health care provider a list of all the medicines, herbs, non-prescription drugs, or dietary supplements you use. Also tell them if you smoke, drink alcohol, or use illegal drugs. Some items may interact with your medicine. What should I watch for while using this medicine? Visit your doctor or health care professional for regular checks on your progress. You will need regular pelvic exams, breast exams, and mammograms. If you are taking this medicine to reduce your risk of getting breast cancer, you should know that this medicine does not prevent all types of breast cancer. If breast cancer or other problems occur, there is no guarantee that it will be found at an early stage. Do not become pregnant while taking this medicine or for 2 months after stopping it. Women should inform their doctor if they wish to become pregnant or think they might be pregnant. There is a potential for serious side effects to an unborn child. Talk to your   health care professional or pharmacist for more information. Do not breast-feed an infant while taking this medicine or for 3 months after stopping it. This medicine may interfere with the ability to have a child. Talk with your doctor or health care professional if you are concerned about your  fertility. What side effects may I notice from receiving this medicine? Side effects that you should report to your doctor or health care professional as soon as possible:  allergic reactions like skin rash, itching or hives, swelling of the face, lips, or tongue  changes in vision  changes in your menstrual cycle  difficulty walking or talking  new breast lumps  numbness  pelvic pain or pressure  redness, blistering, peeling or loosening of the skin, including inside the mouth  signs and symptoms of a dangerous change in heartbeat or heart rhythm like chest pain, dizziness, fast or irregular heartbeat, palpitations, feeling faint or lightheaded, falls, breathing problems  sudden chest pain  swelling, pain or tenderness in your calf or leg  unusual bruising or bleeding  vaginal discharge that is bloody, brown, or rust  weakness  yellowing of the whites of the eyes or skin Side effects that usually do not require medical attention (report to your doctor or health care professional if they continue or are bothersome):  fatigue  hair loss, although uncommon and is usually mild  headache  hot flashes  impotence (in men)  nausea, vomiting (mild)  vaginal discharge (white or clear) This list may not describe all possible side effects. Call your doctor for medical advice about side effects. You may report side effects to FDA at 1-800-FDA-1088. Where should I keep my medicine? Keep out of the reach of children. Store at room temperature between 20 and 25 degrees C (68 and 77 degrees F). Protect from light. Keep container tightly closed. Throw away any unused medicine after the expiration date. NOTE: This sheet is a summary. It may not cover all possible information. If you have questions about this medicine, talk to your doctor, pharmacist, or health care provider.  2020 Elsevier/Gold Standard (2017-12-11 11:15:31)  

## 2018-10-31 NOTE — Progress Notes (Signed)
[facility]  [phone] Fax:([phone]) [phone]    ID: Roberta [name] DOB: 1965/11/02  MR#: [id number]  XJO#:[id number]  Patient Care Team: [name], MD as PCP - General (Internal Medicine) Himmelrich, Bryson Ha, RD (Inactive) as Dietitian ([place]) Mauro Kaufmann, RN as Oncology Nurse Navigator Rockwell Germany, RN as Oncology Nurse Navigator Fanny Skates, MD as Consulting Physician (General Surgery) Magrinat, Virgie Dad, MD as Consulting Physician (Oncology) Kyung Rudd, MD as Consulting Physician (Radiation Oncology) Juanita Craver, MD as Consulting Physician (Gastroenterology) Sherlyn Hay, DO as Consulting Physician (Obstetrics and Gynecology) OTHER MD:   CHIEF COMPLAINT: Estrogen receptor positive breast cancer  CURRENT TREATMENT: Definitive surgery pending   HISTORY OF CURRENT ILLNESS: Roberta [name] had routine screening mammography on 07/25/2018 which was normal.  Less than a month later she was found by Dr. Terri Piedra to have palpable lumps in the superior left breast at a clinic exam.  Roberta [name] underwent unilateral left diagnostic mammography with tomography and left breast ultrasonography at [facility] on 08/23/2018 showing: Breast Density Category C. No suspicious masses are seen on the tomosynthesis images of the left breast. There are 2 groups of indeterminate calcifications however, one in the superior anterior superficial left breast which spans 8 mm. In the upper inner left breast, there is a 2.2 cm area of loosely grouped amorphous calcifications. Normal fibroglandular tissue in the upper inner and upper outer quadrants of the left breast.  There were no sonographic abnormalities to correspond with the masses identified on the clinical breast exam.  Accordingly on 08/29/2018 she proceeded to stereotactic biopsy of the left breast area in question. The pathology from this procedure showed ([id number]): invasive mammary  carcinoma, nottingham grade 2 of 3. Prognostic indicators significant for: estrogen receptor, 100% positive and progesterone receptor, 2% positive, both with strong staining intensity. Proliferation marker Ki67 at 10%. HER2 negative (0+) by immunohistochemistry.  An additional biopsy of the left breast was performed on the same day. The pathology from this procedure showed ([id number]): ductal carcinoma in situ. Prognostic indicators significant for: estrogen receptor, 95% positie and progesterone receptor, 100% positive, both with strong staining intensity.  Finally, she underwent a left axillary ultrasound on 09/02/2018 showing: No left axillary adenopathy.  The patient's subsequent history is as detailed below.   INTERVAL HISTORY: Roberta [name] is here today for follow up of her estrogen positive breast cancer.  Since her last visit she underwent a left bresat mastectomy on 10/01/2018 that showed 0.8cm of invasive ductal carcinoma, grade 2, margins negative with 5 negative sentil lymph nodes.   Roberta [name] had an oncotype of the breast cancer sent which was scored at 3, predicting Roberta [name] to have a 6% risk of distant recurrence of her breast cancer in 9 years with anti estrogens along; and also predicting a less than 1% benefit from chemotherapy.    REVIEW OF SYSTEMS: Roberta [name] notes she is healing well from surgery.  She is seeing physical therapy regularly and is working on her range of motion.  She has an appointment with [employer] [facility] [employer] next week to look at more realistic prosthetics as right now, she is currently having some challenges with finding one that appears symmetric.  Roberta [name] is returning back to work in a few weeks.  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  She enjoys her students.    Roberta [name] denies any fever, chills, chest pain, palpitations, cough, shortness of breath, bowel/bladder changes,  nausea, vomiting, or any other concerns.  A detailed ROS was otherwise  non contributory.     PAST MEDICAL HISTORY: Past Medical History:  Diagnosis Date  . Back pain   . Cancer [facility])    multifocal left breast  . Depression   . Hip pain   . Hypertension   . Obesity   . PONV (postoperative nausea and vomiting)   . Prediabetes   . Swelling    bilat LE  . Wears glasses      PAST SURGICAL HISTORY: Past Surgical History:  Procedure Laterality Date  . CHOLECYSTECTOMY    . COLONOSCOPY    . DILATION AND CURETTAGE OF UTERUS    . GASTRIC BYPASS    . KNEE SURGERY  2002   Ligament repair  . MASTECTOMY W/ SENTINEL NODE BIOPSY Left 10/01/2018   Procedure: LEFT TOTAL MASTECTOMY WITH SENTINEL LYMPH NODE BIOPSY AND BLUE DYE INJECTION;  Surgeon: Fanny Skates, MD;  Location: [facility];  Service: General;  Laterality: Left;     FAMILY HISTORY: Family History  Problem Relation Age of Onset  . Diabetes Mother   . Hypertension Mother   . Heart disease Mother   . Stroke Mother   . Kidney disease Mother   . Obesity Mother   . Cancer Maternal Aunt        lung  . Cancer Maternal Uncle        lung  . Throat cancer Maternal Aunt    Roberta [name]'s father died in his mid 31s from alcoholism.  The patient's mother died at age 49 from end-stage renal disease.  The patient had 2 brothers, 3 sisters, one brother dying from complications of diabetes.  There is no cancer in the immediate family.  There are 2 maternal uncles and 2 maternal aunts who died from lung cancer, all of whom smoked.  A maternal aunt also had a history of throat cancer.  GYNECOLOGIC HISTORY:  No LMP recorded. (Menstrual status: Perimenopausal). 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  She is divorced, lives alone.   ADVANCED  DIRECTIVES: She intends to name her sister [name] as healthcare power of attorney.  Roberta [name] can be reached at [phone].  HEALTH MAINTENANCE: Social History   Tobacco Use  . Smoking status: Never Smoker  . Smokeless tobacco: Never Used  Substance Use Topics  . Alcohol use: Yes    Comment: occasional wine  . Drug use: No    Colonoscopy: August 2020  PAP: Up-to-date  Bone density: 2005   Allergies  Allergen Reactions  . Lisinopril Swelling    Swelling of the tongue  . Penicillins     UNSPECIFIED CHILDHOOD REACTION  . Norvasc [Amlodipine Besylate] Rash  . Red Dye Rash  . Tetanus Toxoids Swelling    Swelling at site of injection    Current Outpatient Medications  Medication Sig Dispense Refill  . Biotin 10000 MCG TABS Take 10,000 mcg by mouth at bedtime.     . Cholecalciferol (VITAMIN D-3) 5000 units TABS Take 1 tablet by mouth daily. (Patient taking differently: Take 5,000 Units by mouth daily. ) 30 tablet   . losartan-hydrochlorothiazide (HYZAAR) 100-25 MG tablet Take 1 tablet by mouth daily. Overdue for annual appt must see  provider for future refills 30 tablet 0  . metFORMIN (GLUCOPHAGE) 500 MG tablet Take 1 tablet (500 mg total) by mouth daily with breakfast. 30 tablet 0  . Multiple Vitamin (MULTIVITAMIN) tablet Take 1 tablet by mouth daily.    . Omega-3 Fatty Acids (OMEGA 3 500 PO) Take 500 mg by mouth daily.     . sertraline (ZOLOFT) 25 MG tablet Take 25 mg by mouth daily.    . vitamin E 100 UNIT capsule Take 180 Units by mouth at bedtime.     Marland Kitchen zinc gluconate 50 MG tablet Take 50 mg by mouth at bedtime.      No current facility-administered medications for this visit.      OBJECTIVE: Morbidly obese African-American woman in no acute distress  Vitals:   10/31/18 1324  BP: (!) 142/79  Pulse: 72  Resp: 18  Temp: 98 F (36.7 C)  SpO2: 100%   Wt Readings from Last 3 Encounters:  10/31/18 268 lb 4.8 oz (121.7 kg)  10/28/18 268 lb 6.4 oz (121.7 kg)   10/01/18 261 lb 8 oz (118.6 kg)   Body mass index is 43.3 kg/m.    ECOG FS:1 - Symptomatic but completely ambulatory GENERAL: Patient is a well appearing female in no acute distress HEENT:  Sclerae anicteric.  Oropharynx clear and moist. No ulcerations or evidence of oropharyngeal candidiasis. Neck is supple.  NODES:  No cervical, supraclavicular, or axillary lymphadenopathy palpated.  BREAST EXAM:  Left breast s/p mastectomy, healing well, no sign of local recurrence, right breast benign LUNGS:  Clear to auscultation bilaterally.  No wheezes or rhonchi. HEART:  Regular rate and rhythm. No murmur appreciated. ABDOMEN:  Soft, nontender.  Positive, normoactive bowel sounds. No organomegaly palpated. MSK:  No focal spinal tenderness to palpation. Full range of motion bilaterally in the upper extremities. EXTREMITIES:  No peripheral edema.   SKIN:  Clear with no obvious rashes or skin changes. No nail dyscrasia. NEURO:  Nonfocal. Well oriented.  Appropriate affect.     LAB RESULTS:  CMP     Component Value Date/Time   NA 137 10/31/2018 1220   NA 139 07/17/2018 1547   K 3.6 10/31/2018 1220   CL 103 10/31/2018 1220   CO2 26 10/31/2018 1220   GLUCOSE 88 10/31/2018 1220   BUN 15 10/31/2018 1220   BUN 16 07/17/2018 1547   CREATININE 0.84 10/31/2018 1220   CREATININE 0.95 09/04/2018 0840   CALCIUM 9.0 10/31/2018 1220   PROT 7.2 10/31/2018 1220   PROT 6.9 07/17/2018 1547   ALBUMIN 3.2 (L) 10/31/2018 1220   ALBUMIN 3.6 (L) 07/17/2018 1547   AST 17 10/31/2018 1220   AST 13 (L) 09/04/2018 0840   ALT 13 10/31/2018 1220   ALT 10 09/04/2018 0840   ALKPHOS 79 10/31/2018 1220   BILITOT 0.4 10/31/2018 1220   BILITOT 0.4 09/04/2018 0840   GFRNONAA >60 10/31/2018 1220   GFRNONAA >60 09/04/2018 0840   GFRAA >60 10/31/2018 1220   GFRAA >60 09/04/2018 0840    No results found for: TOTALPROTELP, ALBUMINELP, A1GS, A2GS, BETS, BETA2SER, GAMS, MSPIKE, SPEI  No results found for:  KPAFRELGTCHN, LAMBDASER, KAPLAMBRATIO  Lab Results  Component Value Date   WBC 4.8 10/31/2018   NEUTROABS 2.6 10/31/2018   HGB 11.7 (L) 10/31/2018   HCT 36.2 10/31/2018   MCV 82.1 10/31/2018   PLT 275 10/31/2018    No results found for: LABCA2  No components found for: BOFBPZ025  No results for input(s): INR  in the last 168 hours.  No results found for: LABCA2  No results found for: KJZ791  No results found for: TAV697  No results found for: XYI016  No results found for: CA2729  No components found for: HGQUANT  No results found for: CEA1 / No results found for: CEA1   No results found for: AFPTUMOR  No results found for: CHROMOGRNA  No results found for: PSA1  Appointment on 10/31/2018  Component Date Value Ref Range Status  . Sodium 10/31/2018 137  135 - 145 mmol/L Final  . Potassium 10/31/2018 3.6  3.5 - 5.1 mmol/L Final  . Chloride 10/31/2018 103  98 - 111 mmol/L Final  . CO2 10/31/2018 26  22 - 32 mmol/L Final  . Glucose, Bld 10/31/2018 88  70 - 99 mg/dL Final  . BUN 10/31/2018 15  6 - 20 mg/dL Final  . Creatinine, Ser 10/31/2018 0.84  0.44 - 1.00 mg/dL Final  . Calcium 10/31/2018 9.0  8.9 - 10.3 mg/dL Final  . Total Protein 10/31/2018 7.2  6.5 - 8.1 g/dL Final  . Albumin 10/31/2018 3.2* 3.5 - 5.0 g/dL Final  . AST 10/31/2018 17  15 - 41 U/L Final  . ALT 10/31/2018 13  0 - 44 U/L Final  . Alkaline Phosphatase 10/31/2018 79  38 - 126 U/L Final  . Total Bilirubin 10/31/2018 0.4  0.3 - 1.2 mg/dL Final  . GFR calc non Af Amer 10/31/2018 >60  >60 mL/min Final  . GFR calc Af Amer 10/31/2018 >60  >60 mL/min Final  . Anion gap 10/31/2018 8  5 - 15 Final   Performed at [facility], [place] [address], [place], [place] [address]  . WBC 10/31/2018 4.8  4.0 - 10.5 K/uL Final  . RBC 10/31/2018 4.41  3.87 - 5.11 MIL/uL Final  . Hemoglobin 10/31/2018 11.7* 12.0 - 15.0 g/dL Final  . HCT 10/31/2018 36.2  36.0 - 46.0 % Final  . MCV 10/31/2018  82.1  80.0 - 100.0 fL Final  . MCH 10/31/2018 26.5  26.0 - 34.0 pg Final  . MCHC 10/31/2018 32.3  30.0 - 36.0 g/dL Final  . RDW 10/31/2018 14.7  11.5 - 15.5 % Final  . Platelets 10/31/2018 275  150 - 400 K/uL Final  . nRBC 10/31/2018 0.0  0.0 - 0.2 % Final  . Neutrophils Relative % 10/31/2018 54  % Final  . Neutro Abs 10/31/2018 2.6  1.7 - 7.7 K/uL Final  . Lymphocytes Relative 10/31/2018 30  % Final  . Lymphs Abs 10/31/2018 1.5  0.7 - 4.0 K/uL Final  . Monocytes Relative 10/31/2018 13  % Final  . Monocytes Absolute 10/31/2018 0.6  0.1 - 1.0 K/uL Final  . Eosinophils Relative 10/31/2018 2  % Final  . Eosinophils Absolute 10/31/2018 0.1  0.0 - 0.5 K/uL Final  . Basophils Relative 10/31/2018 1  % Final  . Basophils Absolute 10/31/2018 0.0  0.0 - 0.1 K/uL Final  . Immature Granulocytes 10/31/2018 0  % Final  . Abs Immature Granulocytes 10/31/2018 0.00  0.00 - 0.07 K/uL Final   Performed at [facility], [place] [address] [place], [place] [address]    (this displays the last labs from the last 3 days)  No results found for: TOTALPROTELP, ALBUMINELP, A1GS, A2GS, BETS, BETA2SER, GAMS, MSPIKE, SPEI (this displays SPEP labs)  No results found for: KPAFRELGTCHN, LAMBDASER, KAPLAMBRATIO (kappa/lambda light chains)  No results found for: HGBA, HGBA2QUANT, HGBFQUANT, HGBSQUAN (Hemoglobinopathy evaluation)  No results found for: LDH  Lab Results  Component Value Date   IRON 162 (H) 01/22/2017   (Iron and TIBC)  Lab Results  Component Value Date   FERRITIN 14.5 01/22/2017    Urinalysis    Component Value Date/Time   COLORURINE YELLOW 06/25/2007 0640   APPEARANCEUR CLOUDY (A) 06/25/2007 0640   LABSPEC 1.016 06/25/2007 0640   PHURINE 6.0 06/25/2007 0640   GLUCOSEU NEGATIVE 06/25/2007 0640   HGBUR LARGE (A) 06/25/2007 0640   BILIRUBINUR NEGATIVE 06/25/2007 0640   KETONESUR NEGATIVE 06/25/2007 0640   PROTEINUR 100 (A) 06/25/2007 0640   UROBILINOGEN 0.2  06/25/2007 0640   NITRITE POSITIVE (A) 06/25/2007 0640   LEUKOCYTESUR MODERATE (A) 06/25/2007 0640     STUDIES:  No results found.   ELIGIBLE FOR AVAILABLE RESEARCH PROTOCOL: no   ASSESSMENT: 53 y.o. [place], [place] woman status post left breast upper inner quadrant biopsy for a clinical T2 N0, stage IB invasive ductal carcinoma, grade 2, estrogen and progesterone receptor positive, HER-2 not amplified, with an MIB-1-1 of 2%  (a) a second biopsy same day different quadrant showed ductal carcinoma in situ  (1) left breast s/p mastectomy on 10/01/2018 showing a 0.8cm invasive ductal carcinoma, grade 2, margins negative.  (a) a total of 5 lymph nodes biopsied were negative.    (2) Oncotype 20/6%  (3) adjuvant radiation not indicated  (4) Antiestrogens with Tamoxifen starting 11/2018   PLAN:  Roberta [name] is doing well today.  She is healing well from her surgery.  She and I discussed her breast cancer and the fact that her surgical results are quite good.  She had stage IA estrogen positive breast cancer with a low oncotype.  Her prognosis is quite good.    We reviewed the role of anti estrogen therapy in her care.  We reviewed her menstrual cycles.  Bird is already peri menopausal, but her LMP was 09/04/2018.  She is not quite a candidate for Anastrozole.  She is not interested in discussing Goserelin and Anastrozole, as she is not in favor of an injection every 4 weeks.  We discussed Tamoxifen in detail.  I reviewed with her common adverse effects such as hot flashes, vaginal wetness, mood changes, arthralgias, blood clots, and endometrial cancer.  I reviewed that though she is taking Zoloft, there has been the question of whether patients who are taking Zoloft can take Tamoxifen because of Zoloft's inhibition of CYP2D6.  The three anti depressants which strongly inhibit CYP2D6 are paxil, prozac, and wellbutrin--which she is not taking.  She has been stable on zoloft, so I will not be making any  changes to this.  I have sent in a prescription for 66m per day.    Roberta Mackieand I reviewed healthy diet and exercise in detail.  Roberta [name] return in 3 months for follow up with Dr. MJana Hakim  She was recommended to continue with the appropriate pandemic precautions. She knows to call for any questions that may arise between now and her next appointment.  We are happy to see her sooner if needed.  A total of (30) minutes of face-to-face time was spent with this patient with greater than 50% of that time in counseling and care-coordination.   LWilber Bihari NP  10/31/18 2:01 PM Medical Oncology and Hematology [facility] [place] [id number]. [phone]   Fax. [phone]

## 2018-11-01 ENCOUNTER — Ambulatory Visit: Payer: BC Managed Care – PPO | Admitting: Physical Therapy

## 2018-11-01 ENCOUNTER — Telehealth: Payer: Self-pay | Admitting: Oncology

## 2018-11-01 DIAGNOSIS — R293 Abnormal posture: Secondary | ICD-10-CM

## 2018-11-01 DIAGNOSIS — M25612 Stiffness of left shoulder, not elsewhere classified: Secondary | ICD-10-CM

## 2018-11-01 DIAGNOSIS — M6281 Muscle weakness (generalized): Secondary | ICD-10-CM

## 2018-11-01 DIAGNOSIS — Z483 Aftercare following surgery for neoplasm: Secondary | ICD-10-CM

## 2018-11-01 NOTE — Telephone Encounter (Signed)
I could not reach regarding schedule  °

## 2018-11-01 NOTE — Therapy (Signed)
[facility] [address] [place], [place], [address] Phone: [phone]   Fax:  [phone]  Physical Therapy Treatment  Patient Details  Name: Roberta [name] MRN: [id number] Date of Birth: 02/01/1965 Referring Provider (PT): Dr. Haywood Ingram   Encounter Date: 11/01/2018  PT End of Session - 11/01/18 1016    Visit Number  9    Number of Visits  25    Date for PT Re-Evaluation  01/02/19    PT Start Time  1000    PT Stop Time  1045    PT Time Calculation (min)  45 min    Activity Tolerance  Patient tolerated treatment well    Behavior During Therapy  WFL for tasks assessed/performed       Past Medical History:  Diagnosis Date  . Back pain   . Cancer (HCC)    multifocal left breast  . Depression   . Hip pain   . Hypertension   . Obesity   . PONV (postoperative nausea and vomiting)   . Prediabetes   . Swelling    bilat LE  . Wears glasses     Past Surgical History:  Procedure Laterality Date  . CHOLECYSTECTOMY    . COLONOSCOPY    . DILATION AND CURETTAGE OF UTERUS    . GASTRIC BYPASS    . KNEE SURGERY  2002   Ligament repair  . MASTECTOMY W/ SENTINEL NODE BIOPSY Left 10/01/2018   Procedure: LEFT TOTAL MASTECTOMY WITH SENTINEL LYMPH NODE BIOPSY AND BLUE DYE INJECTION;  Surgeon: Ingram, Haywood, MD;  Location: [facility] OR;  Service: General;  Laterality: Left;    There were no vitals filed for this visit.  Subjective Assessment - 11/01/18 1014    Subjective  Pt says had a bad night last night "it hit me"  that she had her breast removed    Pertinent History  Patient was diagnosed on 08/23/2018 with left DCIS and invasive ductal carcinoma breast cancer. Patient underwent a left mastectomy with sentinel node biopsy (0/5 nodes positive) on 10/01/2018. It is ER/PR positive and HER2 negative with a Ki67 of 10%. She had gastric bypass surgery in 2005, is pre-diabetic, and has hypertension.    Patient Stated Goals   Get my arm better    Currently in Pain?  No/denies         [facility] PT Assessment - 11/01/18 0001      Assessment   Medical Diagnosis  s/p left mastectomy and SLNB    Referring Provider (PT)  Dr. Haywood Ingram    Onset Date/Surgical Date  10/01/18      Prior Function   Level of Independence  Independent      PROM   Left Shoulder Flexion  165 Degrees   measured in supine                   [facility] Adult PT Treatment/Exercise - 11/01/18 0001      Shoulder Exercises: Supine   Protraction  AROM;Right;Left;10 reps    External Rotation  Strengthening;Left;10 reps    External Rotation Limitations  manual resistance isometrics     Internal Rotation  Strengthening;Right;10 reps    Internal Rotation Limitations  manual resistance isometrics     Flexion  Strengthening;Left;10 reps;Weights    Shoulder Flexion Weight (lbs)  1    Diagonals  AAROM;Left;5 reps    Other Supine Exercises  chest press with one pound in each arm     Other   Supine Exercises  bilateral bicep curls and skull crushes with 1#. eith left arm small circles wit 1# weight in hand 10 reps in each direction       Shoulder Exercises: Sidelying   External Rotation  Strengthening;Left;10 reps;Weights    External Rotation Weight (lbs)  1    Flexion  AROM;Left;10 reps    ABduction  AROM;Left;10 reps    Other Sidelying Exercises  small circles with hand pointed to ceiling,    pt had much difficulty controlling arm for this      Shoulder Exercises: Standing   Other Standing Exercises  leaning against counter top in modificed plank position, pt did 5 shoulder tapes with each arm with cues to keep core engaged                   PT Long Term Goals - 11/01/18 1007      PT LONG TERM GOAL #1   Title  Patient will be independent with a shoulder ROM HEP to improve ROM    Baseline  11/01/2018 : Pt is doing some exercise at home ,but need to progress them    Time  4    Status  Revised      PT LONG TERM GOAL  #2   Title  Patient will increase left shoulder active flexion ROM to >/= 140 degrees for increased ease reaching overhead and to return to baseline.    Baseline  86 taken post op,. 11/01/2018: 90 with shoulder hiking    Time  8    Period  Weeks    Status  New      PT LONG TERM GOAL #3   Title  Patient will increase left active shoulder abduction to >/= 140 degrees for increased ease reaching and to return to baseline.    Baseline  103 degrees post op,11/01/2018 pt shrugs shoulder with attempts had 90 degrees    Time  8      PT LONG TERM GOAL #4   Title  Patient will improve her DASH score to be </= 10 for improved overall upper extremity function.    Baseline  61.36 post-op; 2.27 pre-op    Time  8    Period  Weeks    Status  On-going      PT LONG TERM GOAL #5   Title  Patient will verbalize good understanding of lymphedema risk reduction practices.    Time  8    Period  Weeks    Status  On-going            Plan - 11/01/18 1057    Clinical Impression Statement  Pt continues to have difficulty with AROM and shoudler strength and tends to shoulder hike with attempts at active movement. She responded to isometric exercise and PROM during session today.  Her incision is healing well.  She expressed difficulty with body image today  Provided listening and acceptance of her feelling and also  provided encouragement about how strong her body is and that she can allow herself moments, but she can control her mindset pt will go to [employer] [facility] [employer] on Monday and encouraged her to look at [name] that will not ride up in skin folds.She will benefit from more PT to progress strengthening and ROM Renewal sent today    PT Frequency  2x / week    PT Duration  8 weeks    PT Treatment/Interventions  ADLs/Self Care Home Management;Therapeutic exercise;Manual techniques;Patient/family education;Moist Heat;Passive range  of motion;Scar mobilization    PT Next Visit Plan  continue with scapular  strengthening and control for ROM,Progress with closed chain activities for UE Add core stabilty to allow better scapular stability progress stregth add    PT Home Exercise Plan  Post op shoulder ROM HEP    Consulted and Agree with Plan of Care  Patient       Patient will benefit from skilled therapeutic intervention in order to improve the following deficits and impairments:  Postural dysfunction, Decreased range of motion, Decreased knowledge of precautions, Impaired UE functional use, Pain, Increased fascial restricitons, Decreased strength  Visit Diagnosis: Stiffness of left shoulder, not elsewhere classified - Plan: PT plan of care cert/re-cert  Abnormal posture - Plan: PT plan of care cert/re-cert  Aftercare following surgery for neoplasm - Plan: PT plan of care cert/re-cert  Muscle weakness (generalized) - Plan: PT plan of care cert/re-cert     Problem List Patient Active Problem List   Diagnosis Date Noted  . Cancer of overlapping sites of left female breast (HCC) 10/01/2018  . Morbid obesity with BMI of 40.0-44.9, adult (HCC) 09/04/2018  . Malignant neoplasm of upper-inner quadrant of left breast in female, estrogen receptor positive (HCC) 09/02/2018  . Vitamin D deficiency 02/04/2018  . Urticaria 01/17/2018  . 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 Left hip pain 02/10/2017  . Anemia 01/21/2017  . 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 Prediabetes 12/13/2015  . Class 3 severe obesity with serious comorbidity and body mass index (BMI) of 40.0 to 44.9 in adult (HCC) 12/13/2015  . Sensation of lump in throat 12/13/2015  . Leg edema 12/13/2015  . Dense breasts 12/13/2011  . 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 Hypertension 11/10/2011   Teresa K. Brown, PT  Brown, Teresa Krall 11/01/2018, 11:15 AM  [facility] [address] [place], [place], [address] Phone: [phone]   Fax:  [phone]  Name: Roberta [name] MRN: [id number] Date of Birth: 10/01/1965   

## 2018-11-04 ENCOUNTER — Encounter: Payer: Self-pay | Admitting: *Deleted

## 2018-11-04 ENCOUNTER — Ambulatory Visit: Payer: BC Managed Care – PPO | Admitting: Physical Therapy

## 2018-11-04 NOTE — Progress Notes (Signed)
Returned call to the patient. She stated she is struggling with her scar and feelings of self-esteem. Counselor provided therapeutic listening and support. Pt stated she is interested in the Breast Cancer Support Group through [facility]. Counselor emailed the pt with the November [facility] Support Group Calendar. Counselor reminded patient that one-on-one phone counseling is also available.   Art Buff [place] Counseling Intern Voicemail:  [phone]

## 2018-11-05 ENCOUNTER — Ambulatory Visit: Payer: BC Managed Care – PPO | Attending: General Surgery | Admitting: Physical Therapy

## 2018-11-05 ENCOUNTER — Other Ambulatory Visit: Payer: Self-pay

## 2018-11-05 ENCOUNTER — Encounter: Payer: Self-pay | Admitting: Physical Therapy

## 2018-11-05 DIAGNOSIS — C50212 Malignant neoplasm of upper-inner quadrant of left female breast: Secondary | ICD-10-CM | POA: Insufficient documentation

## 2018-11-05 DIAGNOSIS — R293 Abnormal posture: Secondary | ICD-10-CM | POA: Diagnosis present

## 2018-11-05 DIAGNOSIS — Z483 Aftercare following surgery for neoplasm: Secondary | ICD-10-CM

## 2018-11-05 DIAGNOSIS — M25612 Stiffness of left shoulder, not elsewhere classified: Secondary | ICD-10-CM

## 2018-11-05 DIAGNOSIS — Z170 Estrogen receptor positive status [ER+]: Secondary | ICD-10-CM | POA: Insufficient documentation

## 2018-11-05 DIAGNOSIS — M6281 Muscle weakness (generalized): Secondary | ICD-10-CM | POA: Diagnosis present

## 2018-11-05 NOTE — Therapy (Signed)
[name], [place], [address] Phone: [phone]   Fax:  [phone]  Physical Therapy Treatment  Patient Details  Name: Lamica [name] MRN: [id number] Date of Birth: 1965/06/21 Referring Provider (PT): Dr. Fanny Skates   Encounter Date: 11/05/2018  PT End of Session - 11/05/18 1558    Visit Number  10    Number of Visits  25    Date for PT Re-Evaluation  01/02/19    PT Start Time  1502    PT Stop Time  1547    PT Time Calculation (min)  45 min    Activity Tolerance  Patient tolerated treatment well    Behavior During Therapy  [facility] for tasks assessed/performed       Past Medical History:  Diagnosis Date  . Back pain   . Cancer [facility])    multifocal left breast  . Depression   . Hip pain   . Hypertension   . Obesity   . PONV (postoperative nausea and vomiting)   . Prediabetes   . Swelling    bilat LE  . Wears glasses     Past Surgical History:  Procedure Laterality Date  . CHOLECYSTECTOMY    . COLONOSCOPY    . DILATION AND CURETTAGE OF UTERUS    . GASTRIC BYPASS    . KNEE SURGERY  2002   Ligament repair  . MASTECTOMY W/ SENTINEL NODE BIOPSY Left 10/01/2018   Procedure: LEFT TOTAL MASTECTOMY WITH SENTINEL LYMPH NODE BIOPSY AND BLUE DYE INJECTION;  Surgeon: Fanny Skates, MD;  Location: [place];  Service: General;  Laterality: Left;    There were no vitals filed for this visit.  Subjective Assessment - 11/05/18 1502    Subjective  [place] [facility] [place] after this. My shoulder is feeling good.    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 Patient underwent a left mastectomy with sentinel node biopsy (0/5 nodes positive) on 10/01/2018. It is ER/PR positive and HER2 negative with a Ki67 of 10%. She had gastric bypass surgery in 2005, is pre-diabetic, and has hypertension.    Patient Stated Goals  Get my arm  better    Currently in Pain?  No/denies    Pain Score  0-No pain                       [facility] Adult PT Treatment/Exercise - 11/05/18 0001      Shoulder Exercises: Supine   Protraction  AROM;Right;Left;10 reps    External Rotation  Strengthening;Left;10 reps    External Rotation Limitations  manual resistance isometrics     Internal Rotation  Strengthening;Right;10 reps    Internal Rotation Limitations  manual resistance isometrics     Flexion  Strengthening;Left;10 reps;Weights    Shoulder Flexion Weight (lbs)  1    Diagonals  AAROM;Left;10 reps    Diagonals Weight (lbs)  1    Other Supine Exercises  chest press with one pound in each arm     Other Supine Exercises  skull crushes with 1#. eith left arm small circles wit 1# weight in hand 10 reps in each direction       Shoulder Exercises: Sidelying   External Rotation  Strengthening;Left;10 reps;Weights    External Rotation Weight (lbs)  1    Flexion  AROM;Left;10 reps    Flexion Weight (lbs)  1    ABduction  AROM;Left;10 reps    ABduction Weight (lbs)  1    Other Sidelying Exercises  small circles with hand pointed to ceiling,    pt had much difficulty controlling arm for this      Manual Therapy   Passive ROM  to L shoulder in direction of flexion, abduction and ER to pt's tolerance with traction for increased comfort                  PT Long Term Goals - 11/01/18 1007      PT LONG TERM GOAL #1   Title  Patient will be independent with a shoulder ROM HEP to improve ROM    Baseline  11/01/2018 : Pt is doing some exercise at home ,but need to progress them    Time  4    Status  Revised      PT LONG TERM GOAL #2   Title  Patient will increase left shoulder active flexion ROM to >/= 140 degrees for increased ease reaching overhead and to return to baseline.    Baseline  86 taken post op,. 11/01/2018: 90 with shoulder hiking    Time  8    Period  Weeks    Status  New      PT LONG TERM GOAL #3    Title  Patient will increase left active shoulder abduction to >/= 140 degrees for increased ease reaching and to return to baseline.    Baseline  103 degrees post op,11/01/2018 pt shrugs shoulder with attempts had 90 degrees    Time  8      PT LONG TERM GOAL #4   Title  Patient will improve her DASH score to be </= 10 for improved overall upper extremity function.    Baseline  61.36 post-op; 2.27 pre-op    Time  8    Period  Weeks    Status  On-going      PT LONG TERM GOAL #5   Title  Patient will verbalize good understanding of lymphedema risk reduction practices.    Time  8    Period  Weeks    Status  On-going            Plan - 11/05/18 1559    Clinical Impression Statement  Began session with AROM and strengthening exercises today. Pt reports her arm is moving better today. Continued with isometrics and light strengthening exercises followed by PROM. Pt is being fitted for bras after session today. Pt did not have any pain today with exercises or PROM.    PT Frequency  2x / week    PT Duration  8 weeks    PT Treatment/Interventions  ADLs/Self Care Home Management;Therapeutic exercise;Manual techniques;Patient/family education;Moist Heat;Passive range of motion;Scar mobilization    PT Next Visit Plan  continue with scapular strengthening and control for ROM,Progress with closed chain activities for UE Add core stabilty to allow better scapular stability progress stregth add    Consulted and Agree with Plan of Care  Patient       Patient will benefit from skilled therapeutic intervention in order to improve the following deficits and impairments:  Postural dysfunction, Decreased range of motion, Decreased knowledge of precautions, Impaired UE functional use, Pain, Increased fascial restricitons, Decreased strength  Visit Diagnosis: Stiffness of left shoulder, not elsewhere classified  Abnormal posture  Aftercare following surgery for neoplasm  Muscle weakness  (generalized)     Problem List Patient Active Problem List   Diagnosis Date  Noted  . Cancer of overlapping sites of left female breast ([place]) 10/01/2018  . Morbid obesity with BMI of 40.0-44.9, adult ([place]) 09/04/2018  . Malignant neoplasm of upper-inner quadrant of left breast in female, estrogen receptor positive ([place]) 09/02/2018  . Vitamin D deficiency 02/04/2018  . Urticaria 01/17/2018  . 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 Left hip pain 02/10/2017  . Anemia 01/21/2017  . 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 Prediabetes 12/13/2015  . Class 3 severe obesity with serious comorbidity and body mass index (BMI) of 40.0 to 44.9 in adult ([place]) 12/13/2015  . Sensation of lump in throat 12/13/2015  . Leg edema 12/13/2015  . Dense breasts 12/13/2011  . 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 Hypertension 11/10/2011    [name] 11/05/2018, 4:01 PM  [place], [place], [address] Phone: [phone]   Fax:  [phone]  Name: Roberta [name] MRN: [id number] Date of Birth: November 19, 1965  Manus Gunning, PT 11/05/18 4:01 PM

## 2018-11-07 ENCOUNTER — Other Ambulatory Visit: Payer: Self-pay

## 2018-11-07 ENCOUNTER — Ambulatory Visit: Payer: BC Managed Care – PPO | Admitting: Rehabilitation

## 2018-11-07 ENCOUNTER — Encounter: Payer: Self-pay | Admitting: Rehabilitation

## 2018-11-07 DIAGNOSIS — R293 Abnormal posture: Secondary | ICD-10-CM

## 2018-11-07 DIAGNOSIS — Z483 Aftercare following surgery for neoplasm: Secondary | ICD-10-CM

## 2018-11-07 DIAGNOSIS — Z170 Estrogen receptor positive status [ER+]: Secondary | ICD-10-CM

## 2018-11-07 DIAGNOSIS — M25612 Stiffness of left shoulder, not elsewhere classified: Secondary | ICD-10-CM | POA: Diagnosis not present

## 2018-11-07 DIAGNOSIS — M6281 Muscle weakness (generalized): Secondary | ICD-10-CM

## 2018-11-07 DIAGNOSIS — C50212 Malignant neoplasm of upper-inner quadrant of left female breast: Secondary | ICD-10-CM

## 2018-11-07 NOTE — Therapy (Signed)
[place], [place], [address] Phone: [phone]   Fax:  [phone]  Physical Therapy Treatment  Patient Details  Name: Roberta [name] MRN: [id number] Date of Birth: 1965-05-24 Referring Provider (PT): Dr. Fanny Skates   Encounter Date: 11/07/2018  PT End of Session - 11/07/18 1015    Visit Number  11    Number of Visits  25    Date for PT Re-Evaluation  01/02/19    PT Start Time  1003    PT Stop Time  1045    PT Time Calculation (min)  42 min    Activity Tolerance  Patient tolerated treatment well    Behavior During Therapy  [facility] for tasks assessed/performed       Past Medical History:  Diagnosis Date  . Back pain   . Cancer [facility])    multifocal left breast  . Depression   . Hip pain   . Hypertension   . Obesity   . PONV (postoperative nausea and vomiting)   . Prediabetes   . Swelling    bilat LE  . Wears glasses     Past Surgical History:  Procedure Laterality Date  . CHOLECYSTECTOMY    . COLONOSCOPY    . DILATION AND CURETTAGE OF UTERUS    . GASTRIC BYPASS    . KNEE SURGERY  2002   Ligament repair  . MASTECTOMY W/ SENTINEL NODE BIOPSY Left 10/01/2018   Procedure: LEFT TOTAL MASTECTOMY WITH SENTINEL LYMPH NODE BIOPSY AND BLUE DYE INJECTION;  Surgeon: Fanny Skates, MD;  Location: [facility];  Service: General;  Laterality: Left;    There were no vitals filed for this visit.  Subjective Assessment - 11/07/18 1005    Subjective  I was in so much pain that night my shoulder.  It just lasted that night.  In the morning I stretched and it was back to normal.  I am so hot and sweaty from my new medicine.  My left arm kind of swells at night. I see Dr. 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 Patient underwent a left mastectomy with sentinel node biopsy (0/5 nodes positive) on 10/01/2018. It is  ER/PR positive and HER2 negative with a Ki67 of 10%. She had gastric bypass surgery in 2005, is pre-diabetic, and has hypertension.    Currently in Pain?  No/denies                       [facility] Adult PT Treatment/Exercise - 11/07/18 0001      Shoulder Exercises: Supine   Protraction  AROM;Right;Left;10 reps    Protraction Weight (lbs)  1    Flexion  Strengthening;Left;10 reps;Weights    Shoulder Flexion Weight (lbs)  1    Diagonals  Left;10 reps    Diagonals Weight (lbs)  1    Other Supine Exercises  chest press with one pound in each arm     Other Supine Exercises  skull crushes with 1#. eith left arm small circles wit 1# weight in hand 10 reps in each direction       Shoulder Exercises: Sidelying   External Rotation  Strengthening;Left;10 reps;Weights    External Rotation Weight (lbs)  1    ABduction  AROM;Left;10 reps    ABduction Weight (lbs)  1    Other Sidelying Exercises  small circles with hand pointed  to ceiling,    1     Manual Therapy   Passive ROM  to L shoulder in direction of flexion, abduction and ER to pt's tolerance with traction for increased comfort                  PT Long Term Goals - 11/01/18 1007      PT LONG TERM GOAL #1   Title  Patient will be independent with a shoulder ROM HEP to improve ROM    Baseline  11/01/2018 : Pt is doing some exercise at home ,but need to progress them    Time  4    Status  Revised      PT LONG TERM GOAL #2   Title  Patient will increase left shoulder active flexion ROM to >/= 140 degrees for increased ease reaching overhead and to return to baseline.    Baseline  86 taken post op,. 11/01/2018: 90 with shoulder hiking    Time  8    Period  Weeks    Status  New      PT LONG TERM GOAL #3   Title  Patient will increase left active shoulder abduction to >/= 140 degrees for increased ease reaching and to return to baseline.    Baseline  103 degrees post op,11/01/2018 pt shrugs shoulder with  attempts had 90 degrees    Time  8      PT LONG TERM GOAL #4   Title  Patient will improve her DASH score to be </= 10 for improved overall upper extremity function.    Baseline  61.36 post-op; 2.27 pre-op    Time  8    Period  Weeks    Status  On-going      PT LONG TERM GOAL #5   Title  Patient will verbalize good understanding of lymphedema risk reduction practices.    Time  8    Period  Weeks    Status  On-going            Plan - 11/07/18 1045    Clinical Impression Statement  Pt was sore after last appt but lasting only that evening.  Pt is starting to have alot of syptoms from her new hormone supression medication but overall completed all PT tasks.  No sig change from last time due to soreness    PT Frequency  2x / week    PT Duration  8 weeks    PT Treatment/Interventions  ADLs/Self Care Home Management;Therapeutic exercise;Manual techniques;Patient/family education;Moist Heat;Passive range of motion;Scar mobilization    PT Next Visit Plan  continue with scapular strengthening and control for ROM,Progress with closed chain activities for UE Add core stabilty to allow better scapular stability progress stregth add    Consulted and Agree with Plan of Care  Patient       Patient will benefit from skilled therapeutic intervention in order to improve the following deficits and impairments:  Postural dysfunction, Decreased range of motion, Decreased knowledge of precautions, Impaired UE functional use, Pain, Increased fascial restricitons, Decreased strength  Visit Diagnosis: Stiffness of left shoulder, not elsewhere classified  Abnormal posture  Aftercare following surgery for neoplasm  Muscle weakness (generalized)  Malignant neoplasm of upper-inner quadrant of left breast in female, estrogen receptor positive ([place])     Problem List Patient Active Problem List   Diagnosis Date Noted  . Cancer of overlapping sites of left female breast ([place]) 10/01/2018  . Morbid  obesity with BMI  of 40.0-44.9, adult ([place]) 09/04/2018  . Malignant neoplasm of upper-inner quadrant of left breast in female, estrogen receptor positive ([place]) 09/02/2018  . Vitamin D deficiency 02/04/2018  . Urticaria 01/17/2018  . 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 Left hip pain 02/10/2017  . Anemia 01/21/2017  . 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 Prediabetes 12/13/2015  . Class 3 severe obesity with serious comorbidity and body mass index (BMI) of 40.0 to 44.9 in adult ([place]) 12/13/2015  . Sensation of lump in throat 12/13/2015  . Leg edema 12/13/2015  . Dense breasts 12/13/2011  . 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 Hypertension 11/10/2011    Stark Bray 11/07/2018, 10:47 AM  [place], [place], [address] Phone: [phone]   Fax:  [phone]  Name: Roberta [name] MRN: [id number] Date of Birth: 1965/01/18

## 2018-11-11 ENCOUNTER — Encounter (INDEPENDENT_AMBULATORY_CARE_PROVIDER_SITE_OTHER): Payer: Self-pay

## 2018-11-12 ENCOUNTER — Other Ambulatory Visit: Payer: Self-pay

## 2018-11-12 ENCOUNTER — Ambulatory Visit (INDEPENDENT_AMBULATORY_CARE_PROVIDER_SITE_OTHER): Payer: BC Managed Care – PPO | Admitting: Family Medicine

## 2018-11-12 ENCOUNTER — Encounter: Payer: BC Managed Care – PPO | Admitting: Physical Therapy

## 2018-11-12 ENCOUNTER — Encounter (INDEPENDENT_AMBULATORY_CARE_PROVIDER_SITE_OTHER): Payer: Self-pay | Admitting: Family Medicine

## 2018-11-12 VITALS — BP 98/64 | HR 70 | Temp 98.4°F | Ht 66.0 in | Wt 261.0 lb

## 2018-11-12 DIAGNOSIS — F32A Depression, unspecified: Secondary | ICD-10-CM

## 2018-11-12 DIAGNOSIS — F419 Anxiety disorder, unspecified: Secondary | ICD-10-CM

## 2018-11-12 DIAGNOSIS — F329 Major depressive disorder, single episode, unspecified: Secondary | ICD-10-CM

## 2018-11-12 DIAGNOSIS — Z6841 Body Mass Index (BMI) 40.0 and over, adult: Secondary | ICD-10-CM

## 2018-11-12 NOTE — Progress Notes (Signed)
Office: [phone]  /  Fax: [phone]   HPI:   Chief Complaint: OBESITY Roberta [name] is here to discuss her progress with her obesity treatment plan. She is on the Category 2 plan and is following her eating plan approximately 80 to 85 % of the time. She states she is walking 30 minutes 7 times per week. Adlean's appetite has improved. She does still skip dinner at times due to eating a late lunch. 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 She is S/P mastectomy for breast cancer (10/01/18) and has been on leave. She does tend to snack on ginger snaps. [name] would like to supplement with protein shakes.  Her weight is 261 lb (118.4 kg) today and has had a weight loss of 1 pound over a period of 7 weeks since her last visit. She has lost 31 lbs since starting treatment with [place].  Anxiety and Depression [name]'s mood is stable on Zoloft. She occasionally eats in when she is stressed out but not often. Raushanah plans to attend a support group weekly for breast cancer survivors.  ASSESSMENT AND PLAN:  Anxiety and depression  Class 3 severe obesity with serious comorbidity and body mass index (BMI) of 40.0 to 44.9 in adult, unspecified obesity type ([place])  PLAN:  Anxiety and Depression Shale agrees to continue taking Zoloft and will attend a support group. She will follow up with [place] as directed in 3 weeks.  Obesity [name] is currently in the action stage of change. As such, her goal is to continue with weight loss efforts. She has agreed to follow the Category 2 plan. We discussed exchanges for 2 ounces of meat and that she should get protein from food. [name] has been instructed to continue walking 7 times per week. We discussed the following Behavioral Modification Strategies today: increasing lean protein intake, no skipping meals, and planning for success.  [name] has agreed to follow up with our clinic in 3 weeks. She was  informed of the importance of frequent follow up visits to maximize her success with intensive lifestyle modifications for her multiple health conditions.  ALLERGIES: Allergies  Allergen Reactions   Lisinopril Swelling    Swelling of the tongue   Penicillins     UNSPECIFIED CHILDHOOD REACTION   Norvasc [Amlodipine Besylate] Rash   Red Dye Rash   Tetanus Toxoids Swelling    Swelling at site of injection    MEDICATIONS: Current Outpatient Medications on File Prior to Visit  Medication Sig Dispense Refill   Biotin 10000 MCG TABS Take 10,000 mcg by mouth at bedtime.      Cholecalciferol (VITAMIN D-3) 5000 units TABS Take 1 tablet by mouth daily. (Patient taking differently: Take 5,000 Units by mouth daily. ) 30 tablet    losartan-hydrochlorothiazide (HYZAAR) 100-25 MG tablet Take 1 tablet by mouth daily. Overdue for annual appt must see provider for future refills 30 tablet 0   metFORMIN (GLUCOPHAGE) 500 MG tablet Take 1 tablet (500 mg total) by mouth daily with breakfast. 30 tablet 0   Multiple Vitamin (MULTIVITAMIN) tablet Take 1 tablet by mouth daily.     Omega-3 Fatty Acids (OMEGA 3 500 PO) Take 500 mg by mouth daily.      sertraline (ZOLOFT) 25 MG tablet Take 25 mg by mouth daily.     tamoxifen (NOLVADEX) 20 MG tablet Take 1 tablet (20 mg total) by mouth daily. 30 tablet 3  vitamin E 100 UNIT capsule Take 180 Units by mouth at bedtime.      zinc gluconate 50 MG tablet Take 50 mg by mouth at bedtime.      No current facility-administered medications on file prior to visit.     PAST MEDICAL HISTORY: Past Medical History:  Diagnosis Date   Back pain    Cancer ([place])    multifocal left breast   Depression    Hip pain    Hypertension    Obesity    PONV (postoperative nausea and vomiting)    Prediabetes    Swelling    bilat LE   Wears glasses     PAST SURGICAL HISTORY: Past Surgical History:  Procedure Laterality Date   CHOLECYSTECTOMY      COLONOSCOPY     DILATION AND CURETTAGE OF UTERUS     GASTRIC BYPASS     KNEE SURGERY  2002   Ligament repair   MASTECTOMY W/ SENTINEL NODE BIOPSY Left 10/01/2018   Procedure: LEFT TOTAL MASTECTOMY WITH SENTINEL LYMPH NODE BIOPSY AND BLUE DYE INJECTION;  Surgeon: Fanny Skates, MD;  Location: [facility];  Service: General;  Laterality: Left;    SOCIAL HISTORY: Social History   Tobacco Use   Smoking status: Never Smoker   Smokeless tobacco: Never Used  Substance Use Topics   Alcohol use: Yes    Comment: occasional wine   Drug use: No    FAMILY HISTORY: Family History  Problem Relation Age of Onset   Diabetes Mother    Hypertension Mother    Heart disease Mother    Stroke Mother    Kidney disease Mother    Obesity Mother    Cancer Maternal Aunt        lung   Cancer Maternal Uncle        lung   Throat cancer Maternal Aunt     ROS: Review of Systems  Constitutional: Positive for weight loss.  Psychiatric/Behavioral: Positive for depression. The patient is nervous/anxious.     PHYSICAL EXAM: Blood pressure 98/64, pulse 70, temperature 98.4 F (36.9 C), temperature source Oral, height 5\' 6"  (1.676 m), weight 261 lb (118.4 kg), SpO2 99 %. Body mass index is 42.13 kg/m. Physical Exam Vitals signs reviewed.  Constitutional:      Appearance: Normal appearance. She is obese.  Cardiovascular:     Rate and Rhythm: Normal rate.  Pulmonary:     Effort: Pulmonary effort is normal.  Musculoskeletal: Normal range of motion.  Skin:    General: Skin is warm and dry.  Neurological:     Mental Status: She is alert and oriented to person, place, and time.  Psychiatric:        Mood and Affect: Mood normal.        Behavior: Behavior normal.     RECENT LABS AND TESTS: BMET    Component Value Date/Time   NA 137 10/31/2018 1220   NA 139 07/17/2018 1547   K 3.6 10/31/2018 1220   CL 103 10/31/2018 1220   CO2 26 10/31/2018 1220   GLUCOSE 88 10/31/2018 1220    BUN 15 10/31/2018 1220   BUN 16 07/17/2018 1547   CREATININE 0.84 10/31/2018 1220   CREATININE 0.95 09/04/2018 0840   CALCIUM 9.0 10/31/2018 1220   GFRNONAA >60 10/31/2018 1220   GFRNONAA >60 09/04/2018 0840   GFRAA >60 10/31/2018 1220   GFRAA >60 09/04/2018 0840   Lab Results  Component Value Date   HGBA1C 6.0 (H)  09/25/2018   HGBA1C 5.9 (H) 07/17/2018   HGBA1C 6.1 (H) 01/31/2018   HGBA1C 6.0 (H) 10/08/2017   HGBA1C 6.3 06/25/2017   Lab Results  Component Value Date   INSULIN 13.4 01/31/2018   INSULIN 10.6 10/08/2017   CBC    Component Value Date/Time   WBC 4.8 10/31/2018 1220   RBC 4.41 10/31/2018 1220   HGB 11.7 (L) 10/31/2018 1220   HGB 11.8 (L) 09/04/2018 0840   HGB 12.3 07/17/2018 1547   HCT 36.2 10/31/2018 1220   HCT 38.5 07/17/2018 1547   PLT 275 10/31/2018 1220   PLT 260 09/04/2018 0840   MCV 82.1 10/31/2018 1220   MCV 83 07/17/2018 1547   MCH 26.5 10/31/2018 1220   MCHC 32.3 10/31/2018 1220   RDW 14.7 10/31/2018 1220   RDW 14.9 07/17/2018 1547   LYMPHSABS 1.5 10/31/2018 1220   LYMPHSABS 1.4 07/17/2018 1547   MONOABS 0.6 10/31/2018 1220   EOSABS 0.1 10/31/2018 1220   EOSABS 0.1 07/17/2018 1547   BASOSABS 0.0 10/31/2018 1220   BASOSABS 0.0 07/17/2018 1547   Iron/TIBC/Ferritin/ %Sat    Component Value Date/Time   IRON 162 (H) 01/22/2017 1152   FERRITIN 14.5 01/22/2017 1152   Lipid Panel     Component Value Date/Time   CHOL 112 10/08/2017 1239   TRIG 69 10/08/2017 1239   HDL 50 10/08/2017 1239   CHOLHDL 2 06/25/2017 1541   VLDL 15.0 06/25/2017 1541   LDLCALC 48 10/08/2017 1239   Hepatic Function Panel     Component Value Date/Time   PROT 7.2 10/31/2018 1220   PROT 6.9 07/17/2018 1547   ALBUMIN 3.2 (L) 10/31/2018 1220   ALBUMIN 3.6 (L) 07/17/2018 1547   AST 17 10/31/2018 1220   AST 13 (L) 09/04/2018 0840   ALT 13 10/31/2018 1220   ALT 10 09/04/2018 0840   ALKPHOS 79 10/31/2018 1220   BILITOT 0.4 10/31/2018 1220   BILITOT 0.4  09/04/2018 0840      Component Value Date/Time   TSH 1.480 10/08/2017 1239   TSH 1.24 06/25/2017 1541   TSH 1.53 07/11/2016 1150   Results for AAJAH, UHDE (MRN [id number]) as of 11/12/2018 15:03  Ref. Range 07/17/2018 15:47  Vitamin D, 25-Hydroxy Latest Ref Range: 30.0 - 100.0 ng/mL 70.2   OBESITY BEHAVIORAL INTERVENTION VISIT  Today's visit was # 25  Starting weight: 292 lbs Starting date: 10/18/17 Today's weight : Weight: 261 lb (118.4 kg)  Today's date: 11/19/2018 Total lbs lost to date: 31    11/12/2018  Height 5\' 6"  (1.676 m)  Weight 261 lb (118.4 kg)  BMI (Calculated) 42.15  BLOOD PRESSURE - SYSTOLIC 98  BLOOD PRESSURE - DIASTOLIC 64   Body Fat % [id number] %  Total Body Water (lbs) 88.6 lbs   ASK: We discussed the diagnosis of obesity with Waldemar Dickens today and Leeanna agreed to give [place] permission to discuss obesity behavioral modification therapy today.  ASSESS: Jaquise has the diagnosis of obesity and her BMI today is 42.15. Paullette is in the action stage of change.   ADVISE: Lochlan was educated on the multiple health risks of obesity as well as the benefit of weight loss to improve her health. She was advised of the need for long term treatment and the importance of lifestyle modifications to improve her current health and to decrease her risk of future health problems.  AGREE: Multiple dietary modification options and treatment options were discussed and Merritt agreed to follow the recommendations  documented in the above note.  ARRANGE: Aleighna was educated on the importance of frequent visits to treat obesity as outlined per CMS and USPSTF guidelines and agreed to schedule her next follow up appointment today.  Lenward Chancellor, CMA, am acting as Location manager for [employer], FNP-C  I have reviewed the above documentation for accuracy and completeness, and I agree with the above.  - Alyssah Algeo, FNP-C.

## 2018-11-14 ENCOUNTER — Encounter: Payer: Self-pay | Admitting: Adult Health

## 2018-11-14 ENCOUNTER — Encounter: Payer: Self-pay | Admitting: Physical Therapy

## 2018-11-14 ENCOUNTER — Ambulatory Visit: Payer: BC Managed Care – PPO | Admitting: Physical Therapy

## 2018-11-14 ENCOUNTER — Other Ambulatory Visit: Payer: Self-pay

## 2018-11-14 DIAGNOSIS — M6281 Muscle weakness (generalized): Secondary | ICD-10-CM

## 2018-11-14 DIAGNOSIS — R293 Abnormal posture: Secondary | ICD-10-CM

## 2018-11-14 DIAGNOSIS — M25612 Stiffness of left shoulder, not elsewhere classified: Secondary | ICD-10-CM

## 2018-11-14 NOTE — Therapy (Signed)
[place], [place], [address] Phone: [phone]   Fax:  [phone]  Physical Therapy Treatment  Patient Details  Name: Roberta [name] MRN: [id number] Date of Birth: 1965-11-30 Referring Provider (PT): Dr. Fanny Skates   Encounter Date: 11/14/2018  PT End of Session - 11/14/18 1201    Visit Number  12    Number of Visits  25    Date for PT Re-Evaluation  01/02/19    PT Start Time  1001    PT Stop Time  1055    PT Time Calculation (min)  54 min    Activity Tolerance  Patient tolerated treatment well    Behavior During Therapy  [facility] for tasks assessed/performed       Past Medical History:  Diagnosis Date  . Back pain   . Cancer [facility])    multifocal left breast  . Depression   . Hip pain   . Hypertension   . Obesity   . PONV (postoperative nausea and vomiting)   . Prediabetes   . Swelling    bilat LE  . Wears glasses     Past Surgical History:  Procedure Laterality Date  . CHOLECYSTECTOMY    . COLONOSCOPY    . DILATION AND CURETTAGE OF UTERUS    . GASTRIC BYPASS    . KNEE SURGERY  2002   Ligament repair  . MASTECTOMY W/ SENTINEL NODE BIOPSY Left 10/01/2018   Procedure: LEFT TOTAL MASTECTOMY WITH SENTINEL LYMPH NODE BIOPSY AND BLUE DYE INJECTION;  Surgeon: Fanny Skates, MD;  Location: [place];  Service: General;  Laterality: Left;    There were no vitals filed for this visit.  Subjective Assessment - 11/14/18 1002    Subjective  I can not sleep with this new medicine. I keep having hot flashes.    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 Patient underwent a left mastectomy with sentinel node biopsy (0/5 nodes positive) on 10/01/2018. It is ER/PR positive and HER2 negative with a Ki67 of 10%. She had gastric bypass surgery in 2005, is pre-diabetic, and has hypertension.    Patient Stated Goals  Get my arm  better    Currently in Pain?  No/denies    Pain Score  0-No pain                       [facility] Adult PT Treatment/Exercise - 11/14/18 0001      Shoulder Exercises: Standing   External Rotation  Strengthening;Both;10 reps;Theraband   pt returned therapist demo   Theraband Level (Shoulder External Rotation)  Level 2 (Red)    Internal Rotation  Strengthening;Both;10 reps;Theraband   pt returned therapist demo   Theraband Level (Shoulder Internal Rotation)  Level 2 (Red)    Flexion  Strengthening;Both;10 reps;Theraband   pt returned therapist demo, v/c to keep shoulder from hiking   Theraband Level (Shoulder Flexion)  Level 2 (Red)    Extension  Strengthening;Both;10 reps;Theraband   pt returned therapist demo   Theraband Level (Shoulder Extension)  Level 2 (Red)      Shoulder Exercises: Pulleys   Flexion  2 minutes    ABduction  2 minutes      Shoulder Exercises: Therapy Ball   Flexion  10 reps;Both   with stretch at end range   ABduction  Left;10 reps   with stretch at end range  Shoulder Exercises: ROM/Strengthening   Other ROM/Strengthening Exercises  3 way shoulder raises with 1 lb weight and back on wall x 10 reps each with v/c for correct technique   v/c not to hike L shoulder, keep scapular muscles engaged      Manual Therapy   Soft tissue mobilization  to left lateral trunk in area of tightness with pt's LUE in flexion and along left pec muscle    Passive ROM  to L shoulder in direction of flexion, abduction and ER to pt's tolerance with traction for increased comfort, with prolonged holds                  PT Long Term Goals - 11/01/18 1007      PT LONG TERM GOAL #1   Title  Patient will be independent with a shoulder ROM HEP to improve ROM    Baseline  11/01/2018 : Pt is doing some exercise at home ,but need to progress them    Time  4    Status  Revised      PT LONG TERM GOAL #2   Title  Patient will increase left shoulder active  flexion ROM to >/= 140 degrees for increased ease reaching overhead and to return to baseline.    Baseline  86 taken post op,. 11/01/2018: 90 with shoulder hiking    Time  8    Period  Weeks    Status  New      PT LONG TERM GOAL #3   Title  Patient will increase left active shoulder abduction to >/= 140 degrees for increased ease reaching and to return to baseline.    Baseline  103 degrees post op,11/01/2018 pt shrugs shoulder with attempts had 90 degrees    Time  8      PT LONG TERM GOAL #4   Title  Patient will improve her DASH score to be </= 10 for improved overall upper extremity function.    Baseline  61.36 post-op; 2.27 pre-op    Time  8    Period  Weeks    Status  On-going      PT LONG TERM GOAL #5   Title  Patient will verbalize good understanding of lymphedema risk reduction practices.    Time  8    Period  Weeks    Status  On-going            Plan - 11/14/18 1202    Clinical Impression Statement  Instructed pt in new scapular strengthening exercises including rockwood and 3 way shoulder raises and add these to pt's current HEP. Pt only able to tolerate yellow theraband on LUE due to pain in shoulder with increased resistance. She is still limited by pain in direction of pure abduction but is able to do scaption without pain. Pt required verbal cues to keep left shoulder from hiking during 3 way raises. Performed soft tisse mobilization to left lateral trunk where pt has increased tightness that limits ROM.    Rehab Potential  Excellent    PT Frequency  2x / week    PT Duration  8 weeks    PT Treatment/Interventions  ADLs/Self Care Home Management;Therapeutic exercise;Manual techniques;Patient/family education;Moist Heat;Passive range of motion;Scar mobilization    PT Next Visit Plan  continue with scapular strengthening and control for ROM,Progress with closed chain activities for UE Add core stabilty to allow better scapular stability progress stregth add    PT Home  Exercise Plan  Post  op shoulder ROM HEP    Consulted and Agree with Plan of Care  Patient       Patient will benefit from skilled therapeutic intervention in order to improve the following deficits and impairments:  Postural dysfunction, Decreased range of motion, Decreased knowledge of precautions, Impaired UE functional use, Pain, Increased fascial restricitons, Decreased strength  Visit Diagnosis: Stiffness of left shoulder, not elsewhere classified  Abnormal posture  Muscle weakness (generalized)     Problem List Patient Active Problem List   Diagnosis Date Noted  . Cancer of overlapping sites of left female breast ([place]) 10/01/2018  . Morbid obesity with BMI of 40.0-44.9, adult ([place]) 09/04/2018  . Malignant neoplasm of upper-inner quadrant of left breast in female, estrogen receptor positive ([place]) 09/02/2018  . Vitamin D deficiency 02/04/2018  . Urticaria 01/17/2018  . 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 Left hip pain 02/10/2017  . Anemia 01/21/2017  . 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 Prediabetes 12/13/2015  . Class 3 severe obesity with serious comorbidity and body mass index (BMI) of 40.0 to 44.9 in adult ([place]) 12/13/2015  . Sensation of lump in throat 12/13/2015  . Leg edema 12/13/2015  . Dense breasts 12/13/2011  . 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 Hypertension 11/10/2011    [name] [facility] 11/14/2018, 12:05 PM  [place], [place], [address] Phone: [phone]   Fax:  [phone]  Name: Stacey Sago MRN: [id number] Date of Birth: [birth year]  Manus Gunning, PT 11/14/18 12:05 PM

## 2018-11-14 NOTE — Patient Instructions (Signed)
Strengthening: Resisted Flexion    Cancer Rehab [phone]    Hold tubing with left arm at side. Pull forward and up. Move shoulder through pain-free range of motion. Repeat _10___ times per set. Do _1-2___ sessions per day.  Strengthening: Resisted Internal Rotation    Hold tubing in left hand, elbow at side and forearm out. Rotate forearm in across body. Repeat _10___ times per set. Do _1-2___ sessions per day.  Strengthening: Resisted Extension    Hold tubing in left hand, arm forward. Pull arm back, elbow straight. Repeat __10__ times per set. Do __1-2__ sessions per day.   Strengthening: Resisted External Rotation    Hold tubing in left hand, elbow at side and forearm across body. Rotate forearm out. Repeat _10___ times per set. Do __1-2__ sessions per day.   Repeat on opposite side.  3 Way Raises:      Starting Position:  Leaning against wall, walk feet a few inches away from the wall and make tummy tight (tuck hips underneath you) Press back/shoulders/head against wall as much as possible. Keep thumbs up to ceiling, elbows straight and shoulders relaxed/down throughout.  1. Lift arms in front to shoulder height 2. Lift arms a little wider into a "V" to shoulder height 3. Lift arms out to sides in a "T" to shoulder height  Perform 10 times in each direction. Hold 1 lb to start with and work up to 2-3 sets of 10/day. Perform 3-4 times/week. Increase weight as able, decreasing sets of 10 each time you increase weights, then slowly working your way back up to 2-3 sets each time.    Cancer Rehab [phone]

## 2018-11-18 ENCOUNTER — Other Ambulatory Visit: Payer: Self-pay

## 2018-11-18 ENCOUNTER — Ambulatory Visit: Payer: BC Managed Care – PPO

## 2018-11-18 DIAGNOSIS — M25612 Stiffness of left shoulder, not elsewhere classified: Secondary | ICD-10-CM | POA: Diagnosis not present

## 2018-11-18 DIAGNOSIS — R293 Abnormal posture: Secondary | ICD-10-CM

## 2018-11-18 DIAGNOSIS — M6281 Muscle weakness (generalized): Secondary | ICD-10-CM

## 2018-11-18 NOTE — Therapy (Signed)
[place], [place], [address] Phone: [phone]   Fax:  [phone]  Physical Therapy Treatment  Patient Details  Name: Roberta [name] MRN: [id number] Date of Birth: 07-May-1965 Referring Provider (PT): Dr. Fanny Skates   Encounter Date: 11/18/2018  PT End of Session - 11/18/18 0905    Visit Number  13    Number of Visits  25    Date for PT Re-Evaluation  01/02/19    PT Start Time  0805    PT Stop Time  0903    PT Time Calculation (min)  58 min    Activity Tolerance  Patient tolerated treatment well    Behavior During Therapy  [facility] for tasks assessed/performed       Past Medical History:  Diagnosis Date  . Back pain   . Cancer [facility])    multifocal left breast  . Depression   . Hip pain   . Hypertension   . Obesity   . PONV (postoperative nausea and vomiting)   . Prediabetes   . Swelling    bilat LE  . Wears glasses     Past Surgical History:  Procedure Laterality Date  . CHOLECYSTECTOMY    . COLONOSCOPY    . DILATION AND CURETTAGE OF UTERUS    . GASTRIC BYPASS    . KNEE SURGERY  2002   Ligament repair  . MASTECTOMY W/ SENTINEL NODE BIOPSY Left 10/01/2018   Procedure: LEFT TOTAL MASTECTOMY WITH SENTINEL LYMPH NODE BIOPSY AND BLUE DYE INJECTION;  Surgeon: Fanny Skates, MD;  Location: [place];  Service: General;  Laterality: Left;    There were no vitals filed for this visit.  Subjective Assessment - 11/18/18 0806    Subjective  I've been doing the exercises she gave me last time and I just put on my music and get it done. The abduction is tight when I start, but it's been loosening up once I get started. I've noticed 2 little areas of swelling at my Lt chest that I want you to check. I plan to talk to Dr. Leonel Ramsay about this when I see him next week.    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 Patient  underwent a left mastectomy with sentinel node biopsy (0/5 nodes positive) on 10/01/2018. It is ER/PR positive and HER2 negative with a Ki67 of 10%. She had gastric bypass surgery in 2005, is pre-diabetic, and has hypertension.    Patient Stated Goals  Get my arm better    Currently in Pain?  No/denies                       [facility] Adult PT Treatment/Exercise - 11/18/18 0001      Shoulder Exercises: Pulleys   Flexion  2 minutes    ABduction  2 minutes    ABduction Limitations  VCs to remind no side lean      Shoulder Exercises: Therapy Ball   Flexion  Both;10 reps   forward lean into end of stretch   ABduction  Left;10 reps   same side lean into end of stretch     Manual Therapy   Soft tissue mobilization  to left lateral trunk in area of tightness with pt's LUE in flexion and along left pec muscle    Myofascial Release  To Lt chest wall with crosshands technique horizontally superior to incision at area of  tightness     Passive ROM  to L shoulder in direction of flexion, abduction and D2 to pt's tolerance with traction for increased comfort, with prolonged holds and scapular depression throughout                  PT Long Term Goals - 11/01/18 1007      PT LONG TERM GOAL #1   Title  Patient will be independent with a shoulder ROM HEP to improve ROM    Baseline  11/01/2018 : Pt is doing some exercise at home ,but need to progress them    Time  4    Status  Revised      PT LONG TERM GOAL #2   Title  Patient will increase left shoulder active flexion ROM to >/= 140 degrees for increased ease reaching overhead and to return to baseline.    Baseline  86 taken post op,. 11/01/2018: 90 with shoulder hiking    Time  8    Period  Weeks    Status  New      PT LONG TERM GOAL #3   Title  Patient will increase left active shoulder abduction to >/= 140 degrees for increased ease reaching and to return to baseline.    Baseline  103 degrees post op,11/01/2018 pt  shrugs shoulder with attempts had 90 degrees    Time  8      PT LONG TERM GOAL #4   Title  Patient will improve her DASH score to be </= 10 for improved overall upper extremity function.    Baseline  61.36 post-op; 2.27 pre-op    Time  8    Period  Weeks    Status  On-going      PT LONG TERM GOAL #5   Title  Patient will verbalize good understanding of lymphedema risk reduction practices.    Time  8    Period  Weeks    Status  On-going            Plan - 11/18/18 0905    Clinical Impression Statement  Focsued more on maual therapy today as pt reported concerns of tightness superior to her incision. By end of session she reports much loosening of tissue here and at Lt shoulder. Tightness seems to be soft tissue related.    Personal Factors and Comorbidities  Other   lives alone   Stability/Clinical Decision Making  Stable/Uncomplicated    Rehab Potential  Excellent    PT Frequency  2x / week    PT Duration  8 weeks    PT Treatment/Interventions  ADLs/Self Care Home Management;Therapeutic exercise;Manual techniques;Patient/family education;Moist Heat;Passive range of motion;Scar mobilization    PT Next Visit Plan  continue with scapular strengthening and control for ROM,Progress with closed chain activities for UE (forearm walking on wall?)  Add core stabilty to allow better scapular stability progress stregth add    Consulted and Agree with Plan of Care  Patient       Patient will benefit from skilled therapeutic intervention in order to improve the following deficits and impairments:  Postural dysfunction, Decreased range of motion, Decreased knowledge of precautions, Impaired UE functional use, Pain, Increased fascial restricitons, Decreased strength  Visit Diagnosis: Stiffness of left shoulder, not elsewhere classified  Abnormal posture  Muscle weakness (generalized)     Problem List Patient Active Problem List   Diagnosis Date Noted  . Cancer of overlapping sites  of left female breast ([place]) 10/01/2018  .  Morbid obesity with BMI of 40.0-44.9, adult ([place]) 09/04/2018  . Malignant neoplasm of upper-inner quadrant of left breast in female, estrogen receptor positive ([place]) 09/02/2018  . Vitamin D deficiency 02/04/2018  . Urticaria 01/17/2018  . 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 Left hip pain 02/10/2017  . Anemia 01/21/2017  . 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 Prediabetes 12/13/2015  . Class 3 severe obesity with serious comorbidity and body mass index (BMI) of 40.0 to 44.9 in adult ([place]) 12/13/2015  . Sensation of lump in throat 12/13/2015  . Leg edema 12/13/2015  . Dense breasts 12/13/2011  . 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 Hypertension 11/10/2011    Otelia Limes, PTA 11/18/2018, 9:08 AM  [place], [place], [address] Phone: [phone]   Fax:  [phone]  Name: Roberta [name] MRN: [id number] Date of Birth: 1965/12/25

## 2018-11-19 ENCOUNTER — Encounter (INDEPENDENT_AMBULATORY_CARE_PROVIDER_SITE_OTHER): Payer: Self-pay | Admitting: Family Medicine

## 2018-11-20 ENCOUNTER — Other Ambulatory Visit: Payer: Self-pay

## 2018-11-20 ENCOUNTER — Ambulatory Visit: Payer: BC Managed Care – PPO

## 2018-11-20 DIAGNOSIS — M25612 Stiffness of left shoulder, not elsewhere classified: Secondary | ICD-10-CM

## 2018-11-20 DIAGNOSIS — R293 Abnormal posture: Secondary | ICD-10-CM

## 2018-11-20 DIAGNOSIS — M6281 Muscle weakness (generalized): Secondary | ICD-10-CM

## 2018-11-20 NOTE — Therapy (Signed)
[place], [place], [address] Phone: [phone]   Fax:  [phone]  Physical Therapy Treatment  Patient Details  Name: Aleasia Fuge MRN: [id number] Date of Birth: Aug 06, 1965 Referring Provider (PT): Dr. Fanny Skates   Encounter Date: 11/20/2018  PT End of Session - 11/20/18 0859    Visit Number  14    Number of Visits  25    Date for PT Re-Evaluation  01/02/19    PT Start Time  0804    PT Stop Time  0858    PT Time Calculation (min)  54 min    Activity Tolerance  Patient tolerated treatment well    Behavior During Therapy  [facility] for tasks assessed/performed       Past Medical History:  Diagnosis Date  . Back pain   . Cancer [facility])    multifocal left breast  . Depression   . Hip pain   . Hypertension   . Obesity   . PONV (postoperative nausea and vomiting)   . Prediabetes   . Swelling    bilat LE  . Wears glasses     Past Surgical History:  Procedure Laterality Date  . CHOLECYSTECTOMY    . COLONOSCOPY    . DILATION AND CURETTAGE OF UTERUS    . GASTRIC BYPASS    . KNEE SURGERY  2002   Ligament repair  . MASTECTOMY W/ SENTINEL NODE BIOPSY Left 10/01/2018   Procedure: LEFT TOTAL MASTECTOMY WITH SENTINEL LYMPH NODE BIOPSY AND BLUE DYE INJECTION;  Surgeon: Fanny Skates, MD;  Location: [place];  Service: General;  Laterality: Left;    There were no vitals filed for this visit.  Subjective Assessment - 11/20/18 0806    Subjective  I'm feeling pretty good. Did the doorway stretches yesterday and they went well.    Patient Stated Goals  Get my arm better    Currently in Pain?  No/denies                       [facility] Adult PT Treatment/Exercise - 11/20/18 0001      Shoulder Exercises: Standing   Other Standing Exercises  Forearm walking on wall with yellow theraband 5x, then scapular retraction 5x each returning therapist demo and VCs for correct UE position       Shoulder Exercises: Pulleys   Flexion  2 minutes    Flexion Limitations  VCs to decrease Lt scapular compensation    ABduction  2 minutes    ABduction Limitations  VCs to decrease Lt scapular compensation      Shoulder Exercises: Therapy Ball   Flexion  Both;10 reps   forward lean into end of stretch     Shoulder Exercises: Stretch   Wall Stretch - ABduction  5 reps   5 sec holds with finger ladder   Wall Stretch - ABduction Limitations  Tactile cues to decrease Lt scapular compensation; then "snow angels" on wall x5      Manual Therapy   Soft tissue mobilization  to left lateral trunk in area of tightness with pt's LUE in flexion and along left pec muscle    Myofascial Release  To Lt chest wall with crosshands technique horizontally superior to incision at area of tightness     Passive ROM  to L shoulder in direction of flexion, abduction and D2 to pt's tolerance with traction for increased comfort, with prolonged holds and scapular depression throughout  PT Long Term Goals - 11/01/18 1007      PT LONG TERM GOAL #1   Title  Patient will be independent with a shoulder ROM HEP to improve ROM    Baseline  11/01/2018 : Pt is doing some exercise at home ,but need to progress them    Time  4    Status  Revised      PT LONG TERM GOAL #2   Title  Patient will increase left shoulder active flexion ROM to >/= 140 degrees for increased ease reaching overhead and to return to baseline.    Baseline  86 taken post op,. 11/01/2018: 90 with shoulder hiking    Time  8    Period  Weeks    Status  New      PT LONG TERM GOAL #3   Title  Patient will increase left active shoulder abduction to >/= 140 degrees for increased ease reaching and to return to baseline.    Baseline  103 degrees post op,11/01/2018 pt shrugs shoulder with attempts had 90 degrees    Time  8      PT LONG TERM GOAL #4   Title  Patient will improve her DASH score to be </= 10 for improved  overall upper extremity function.    Baseline  61.36 post-op; 2.27 pre-op    Time  8    Period  Weeks    Status  On-going      PT LONG TERM GOAL #5   Title  Patient will verbalize good understanding of lymphedema risk reduction practices.    Time  8    Period  Weeks    Status  On-going            Plan - 11/20/18 0900    Clinical Impression Statement  Progressed scapular stability to include wall walking with yellow theraband and pt tolerated this well feeling challenged by this. Also continued with manual therapy to promote increased end ROM and decrease fascial tightness at Lt chest wall and axilla.    Personal Factors and Comorbidities  Other   lives alone   Stability/Clinical Decision Making  Stable/Uncomplicated    Rehab Potential  Excellent    PT Frequency  2x / week    PT Duration  8 weeks    PT Treatment/Interventions  ADLs/Self Care Home Management;Therapeutic exercise;Manual techniques;Patient/family education;Moist Heat;Passive range of motion;Scar mobilization    PT Next Visit Plan  continue with scapular strengthening and control for ROM,Progress with closed chain activities for UE (cont forearm walking on wall/add to HEP)  Add core stabilty to allow better scapular stabilit    PT Home Exercise Plan  Post op shoulder ROM HEP    Consulted and Agree with Plan of Care  Patient       Patient will benefit from skilled therapeutic intervention in order to improve the following deficits and impairments:  Postural dysfunction, Decreased range of motion, Decreased knowledge of precautions, Impaired UE functional use, Pain, Increased fascial restricitons, Decreased strength  Visit Diagnosis: Stiffness of left shoulder, not elsewhere classified  Abnormal posture  Muscle weakness (generalized)     Problem List Patient Active Problem List   Diagnosis Date Noted  . Cancer of overlapping sites of left female breast ([place]) 10/01/2018  . Morbid obesity with BMI of  40.0-44.9, adult ([place]) 09/04/2018  . Malignant neoplasm of upper-inner quadrant of left breast in female, estrogen receptor positive ([place]) 09/02/2018  . Vitamin D deficiency 02/04/2018  . Urticaria  01/17/2018  . 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 Left hip pain 02/10/2017  . Anemia 01/21/2017  . 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 Prediabetes 12/13/2015  . Class 3 severe obesity with serious comorbidity and body mass index (BMI) of 40.0 to 44.9 in adult ([place]) 12/13/2015  . Sensation of lump in throat 12/13/2015  . Leg edema 12/13/2015  . Dense breasts 12/13/2011  . 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 Hypertension 11/10/2011    Otelia Limes, PTA 11/20/2018, 9:04 AM  [place], [place], [address] Phone: [phone]   Fax:  [phone]  Name: Anae Steenson MRN: [id number] Date of Birth: 1965-01-06

## 2018-11-22 ENCOUNTER — Other Ambulatory Visit: Payer: Self-pay

## 2018-11-22 ENCOUNTER — Encounter: Payer: Self-pay | Admitting: Physical Therapy

## 2018-11-22 ENCOUNTER — Ambulatory Visit: Payer: BC Managed Care – PPO | Admitting: Physical Therapy

## 2018-11-22 DIAGNOSIS — M25612 Stiffness of left shoulder, not elsewhere classified: Secondary | ICD-10-CM | POA: Diagnosis not present

## 2018-11-22 DIAGNOSIS — R293 Abnormal posture: Secondary | ICD-10-CM

## 2018-11-22 DIAGNOSIS — M6281 Muscle weakness (generalized): Secondary | ICD-10-CM

## 2018-11-22 DIAGNOSIS — Z483 Aftercare following surgery for neoplasm: Secondary | ICD-10-CM

## 2018-11-22 NOTE — Therapy (Signed)
[place], [place], [address] Phone: [phone]   Fax:  [phone]  Physical Therapy Treatment  Patient Details  Name: Roberta [name] MRN: [id number] Date of Birth: 01/26/1965 Referring Provider (PT): Dr. Fanny Skates   Encounter Date: 11/22/2018  PT End of Session - 11/22/18 0851    Visit Number  15    Number of Visits  25    Date for PT Re-Evaluation  01/02/19    PT Start Time  0805    PT Stop Time  0849    PT Time Calculation (min)  44 min    Activity Tolerance  Patient tolerated treatment well    Behavior During Therapy  [facility] for tasks assessed/performed       Past Medical History:  Diagnosis Date  . Back pain   . Cancer [facility])    multifocal left breast  . Depression   . Hip pain   . Hypertension   . Obesity   . PONV (postoperative nausea and vomiting)   . Prediabetes   . Swelling    bilat LE  . Wears glasses     Past Surgical History:  Procedure Laterality Date  . CHOLECYSTECTOMY    . COLONOSCOPY    . DILATION AND CURETTAGE OF UTERUS    . GASTRIC BYPASS    . KNEE SURGERY  2002   Ligament repair  . MASTECTOMY W/ SENTINEL NODE BIOPSY Left 10/01/2018   Procedure: LEFT TOTAL MASTECTOMY WITH SENTINEL LYMPH NODE BIOPSY AND BLUE DYE INJECTION;  Surgeon: Fanny Skates, MD;  Location: [place];  Service: General;  Laterality: Left;    There were no vitals filed for this visit.  Subjective Assessment - 11/22/18 0806    Subjective  My shoulder is doing good this morning. No pain.    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 Patient underwent a left mastectomy with sentinel node biopsy (0/5 nodes positive) on 10/01/2018. It is ER/PR positive and HER2 negative with a Ki67 of 10%. She had gastric bypass surgery in 2005, is pre-diabetic, and has hypertension.    Patient Stated Goals  Get my arm better    Currently  in Pain?  No/denies    Pain Score  0-No pain                       [facility] Adult PT Treatment/Exercise - 11/22/18 0001      Shoulder Exercises: Standing   Other Standing Exercises  Forearm walking on wall with yellow theraband 8x, then scapular retraction 5x each returning therapist demo and VCs for correct UE position      Shoulder Exercises: Pulleys   Flexion  2 minutes    Flexion Limitations  VCs to decrease Lt scapular compensation    ABduction  2 minutes    ABduction Limitations  VCs to decrease Lt scapular compensation      Shoulder Exercises: Therapy Ball   Flexion  Both;10 reps   forward lean into end of stretch     Shoulder Exercises: Stretch   Wall Stretch - ABduction  5 reps   5 sec holds with finger ladder to 27   Wall Stretch - ABduction Limitations  Tactile cues to decrease Lt scapular compensation; then "snow angels" on wall x5      Manual Therapy   Soft tissue mobilization  to left lateral trunk in area of tightness with  pt's LUE in flexion and along left pec muscle, and along left mastectomy scar to loosen scar tissue    Passive ROM  to L shoulder in direction of flexion, abduction and D2 to pt's tolerance with traction for increased comfort, with prolonged holds and scapular depression throughout                  PT Long Term Goals - 11/01/18 1007      PT LONG TERM GOAL #1   Title  Patient will be independent with a shoulder ROM HEP to improve ROM    Baseline  11/01/2018 : Pt is doing some exercise at home ,but need to progress them    Time  4    Status  Revised      PT LONG TERM GOAL #2   Title  Patient will increase left shoulder active flexion ROM to >/= 140 degrees for increased ease reaching overhead and to return to baseline.    Baseline  86 taken post op,. 11/01/2018: 90 with shoulder hiking    Time  8    Period  Weeks    Status  New      PT LONG TERM GOAL #3   Title  Patient will increase left active shoulder abduction  to >/= 140 degrees for increased ease reaching and to return to baseline.    Baseline  103 degrees post op,11/01/2018 pt shrugs shoulder with attempts had 90 degrees    Time  8      PT LONG TERM GOAL #4   Title  Patient will improve her DASH score to be </= 10 for improved overall upper extremity function.    Baseline  61.36 post-op; 2.27 pre-op    Time  8    Period  Weeks    Status  On-going      PT LONG TERM GOAL #5   Title  Patient will verbalize good understanding of lymphedema risk reduction practices.    Time  8    Period  Weeks    Status  On-going            Plan - 11/22/18 4431    Clinical Impression Statement  Pt still feeling very challenged with wall walking with yellow theraband around forearms. She also has increased difficulty with snow angels and is very limited on left. Continued with manual therapy to help decrease tightness in left chest wall and axilla to allow improved ROM.    PT Frequency  2x / week    PT Duration  8 weeks    PT Treatment/Interventions  ADLs/Self Care Home Management;Therapeutic exercise;Manual techniques;Patient/family education;Moist Heat;Passive range of motion;Scar mobilization    PT Next Visit Plan  continue with scapular strengthening and control for ROM,Progress with closed chain activities for UE (cont forearm walking on wall/add to HEP)  Add core stabilty to allow better scapular stabilit    Consulted and Agree with Plan of Care  Patient       Patient will benefit from skilled therapeutic intervention in order to improve the following deficits and impairments:  Postural dysfunction, Decreased range of motion, Decreased knowledge of precautions, Impaired UE functional use, Pain, Increased fascial restricitons, Decreased strength  Visit Diagnosis: Stiffness of left shoulder, not elsewhere classified  Abnormal posture  Muscle weakness (generalized)  Aftercare following surgery for neoplasm     Problem List Patient Active  Problem List   Diagnosis Date Noted  . Cancer of overlapping sites of left female breast ([place])  10/01/2018  . Morbid obesity with BMI of 40.0-44.9, adult ([place]) 09/04/2018  . Malignant neoplasm of upper-inner quadrant of left breast in female, estrogen receptor positive ([place]) 09/02/2018  . Vitamin D deficiency 02/04/2018  . Urticaria 01/17/2018  . 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 Left hip pain 02/10/2017  . Anemia 01/21/2017  . 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 Prediabetes 12/13/2015  . Class 3 severe obesity with serious comorbidity and body mass index (BMI) of 40.0 to 44.9 in adult ([place]) 12/13/2015  . Sensation of lump in throat 12/13/2015  . Leg edema 12/13/2015  . Dense breasts 12/13/2011  . 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 Hypertension 11/10/2011    [name] [facility] 11/22/2018, 8:53 AM  [place], [place], [address] Phone: [phone]   Fax:  [phone]  Name: Roberta [name] MRN: [id number] Date of Birth: 02-Mar-1965  Manus Gunning, PT 11/22/18 8:53 AM

## 2018-11-26 ENCOUNTER — Encounter: Payer: Self-pay | Admitting: Physical Therapy

## 2018-11-26 ENCOUNTER — Ambulatory Visit: Payer: BC Managed Care – PPO | Admitting: Physical Therapy

## 2018-11-26 ENCOUNTER — Other Ambulatory Visit: Payer: Self-pay

## 2018-11-26 DIAGNOSIS — M6281 Muscle weakness (generalized): Secondary | ICD-10-CM

## 2018-11-26 DIAGNOSIS — M25612 Stiffness of left shoulder, not elsewhere classified: Secondary | ICD-10-CM | POA: Diagnosis not present

## 2018-11-26 DIAGNOSIS — Z483 Aftercare following surgery for neoplasm: Secondary | ICD-10-CM

## 2018-11-26 DIAGNOSIS — R293 Abnormal posture: Secondary | ICD-10-CM

## 2018-11-26 NOTE — Therapy (Signed)
[place], [place], [address] Phone: [phone]   Fax:  [phone]  Physical Therapy Treatment  Patient Details  Name: Roberta [name] MRN: [id number] Date of Birth: Jan 31, 1965 Referring Provider (PT): Dr. Fanny Skates   Encounter Date: 11/26/2018  PT End of Session - 11/26/18 1553    Visit Number  16    Number of Visits  25    Date for PT Re-Evaluation  01/02/19    PT Start Time  1502    PT Stop Time  1551    PT Time Calculation (min)  49 min    Activity Tolerance  Patient tolerated treatment well    Behavior During Therapy  [facility] for tasks assessed/performed       Past Medical History:  Diagnosis Date  . Back pain   . Cancer [facility])    multifocal left breast  . Depression   . Hip pain   . Hypertension   . Obesity   . PONV (postoperative nausea and vomiting)   . Prediabetes   . Swelling    bilat LE  . Wears glasses     Past Surgical History:  Procedure Laterality Date  . CHOLECYSTECTOMY    . COLONOSCOPY    . DILATION AND CURETTAGE OF UTERUS    . GASTRIC BYPASS    . KNEE SURGERY  2002   Ligament repair  . MASTECTOMY W/ SENTINEL NODE BIOPSY Left 10/01/2018   Procedure: LEFT TOTAL MASTECTOMY WITH SENTINEL LYMPH NODE BIOPSY AND BLUE DYE INJECTION;  Surgeon: Fanny Skates, MD;  Location: [place];  Service: General;  Laterality: Left;    There were no vitals filed for this visit.  Subjective Assessment - 11/26/18 1502    Subjective  My shoulder is not tight but I am still limited with ROM.    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 Patient underwent a left mastectomy with sentinel node biopsy (0/5 nodes positive) on 10/01/2018. It is ER/PR positive and HER2 negative with a Ki67 of 10%. She had gastric bypass surgery in 2005, is pre-diabetic, and has hypertension.    Patient Stated Goals  Get my arm better    Currently in Pain?  No/denies    Pain Score  0-No pain                       [facility] Adult PT Treatment/Exercise - 11/26/18 0001      Shoulder Exercises: Standing   Other Standing Exercises  Forearm walking on wall with yellow theraband 8x, then scapular retraction 5x each returning therapist demo and VCs for correct UE position   forearm rolling on wall with foam roller x 10 reps     Shoulder Exercises: Pulleys   Flexion  2 minutes    Flexion Limitations  VCs to decrease Lt scapular compensation    ABduction  2 minutes    ABduction Limitations  VCs to decrease Lt scapular compensation      Shoulder Exercises: Therapy Ball   Flexion  Both;10 reps   forward lean into end of stretch     Shoulder Exercises: Stretch   Wall Stretch - ABduction  5 reps   5 sec holds with finger ladder to 27   Wall Stretch - ABduction Limitations  Tactile cues to decrease Lt scapular compensation; then "snow angels" on wall x5      Manual Therapy  Soft tissue mobilization  to left lateral trunk in area of tightness with pt's LUE in flexion and along left pec muscle, and along left mastectomy scar to loosen scar tissue    Scapular Mobilization  in right sidelying to the left scapula into all directions,     Passive ROM  to L shoulder in direction of flexion, abduction and D2 to pt's tolerance with traction for increased comfort, with prolonged holds and scapular depression throughout                  PT Long Term Goals - 11/01/18 1007      PT LONG TERM GOAL #1   Title  Patient will be independent with a shoulder ROM HEP to improve ROM    Baseline  11/01/2018 : Pt is doing some exercise at home ,but need to progress them    Time  4    Status  Revised      PT LONG TERM GOAL #2   Title  Patient will increase left shoulder active flexion ROM to >/= 140 degrees for increased ease reaching overhead and to return to baseline.    Baseline  86 taken post op,. 11/01/2018: 90 with  shoulder hiking    Time  8    Period  Weeks    Status  New      PT LONG TERM GOAL #3   Title  Patient will increase left active shoulder abduction to >/= 140 degrees for increased ease reaching and to return to baseline.    Baseline  103 degrees post op,11/01/2018 pt shrugs shoulder with attempts had 90 degrees    Time  8      PT LONG TERM GOAL #4   Title  Patient will improve her DASH score to be </= 10 for improved overall upper extremity function.    Baseline  61.36 post-op; 2.27 pre-op    Time  8    Period  Weeks    Status  On-going      PT LONG TERM GOAL #5   Title  Patient will verbalize good understanding of lymphedema risk reduction practices.    Time  8    Period  Weeks    Status  On-going            Plan - 11/26/18 1555    Clinical Impression Statement  Continued with active assisted ROM and strengthening exercises today. Pt still very limited with left shoulder external rotation and abduction. Worked on left scapular mobilization today and following scapular mobs pt was able to tolerate increased stretching in to abduction and external rotation on the left with increased PROM. Will continue this at next session.    PT Frequency  2x / week    PT Duration  8 weeks    PT Treatment/Interventions  ADLs/Self Care Home Management;Therapeutic exercise;Manual techniques;Patient/family education;Moist Heat;Passive range of motion;Scar mobilization    PT Next Visit Plan  do left scapular mobs, continue with scapular strengthening and control for ROM,Progress with closed chain activities for UE (cont forearm walking on wall/add to HEP)  Add core stabilty to allow better scapular stabilit    PT Home Exercise Plan  Post op shoulder ROM HEP, 3 way shoulder    Consulted and Agree with Plan of Care  Patient       Patient will benefit from skilled therapeutic intervention in order to improve the following deficits and impairments:  Postural dysfunction, Decreased range of motion,  Decreased knowledge of  precautions, Impaired UE functional use, Pain, Increased fascial restricitons, Decreased strength  Visit Diagnosis: Stiffness of left shoulder, not elsewhere classified  Abnormal posture  Muscle weakness (generalized)  Aftercare following surgery for neoplasm     Problem List Patient Active Problem List   Diagnosis Date Noted  . Cancer of overlapping sites of left female breast ([place]) 10/01/2018  . Morbid obesity with BMI of 40.0-44.9, adult ([place]) 09/04/2018  . Malignant neoplasm of upper-inner quadrant of left breast in female, estrogen receptor positive ([place]) 09/02/2018  . Vitamin D deficiency 02/04/2018  . Urticaria 01/17/2018  . 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 Left hip pain 02/10/2017  . Anemia 01/21/2017  . 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 Prediabetes 12/13/2015  . Class 3 severe obesity with serious comorbidity and body mass index (BMI) of 40.0 to 44.9 in adult ([place]) 12/13/2015  . Sensation of lump in throat 12/13/2015  . Leg edema 12/13/2015  . Dense breasts 12/13/2011  . 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 Hypertension 11/10/2011    [name] [facility] 11/26/2018, 3:57 PM  [place], [place], [address] Phone: [phone]   Fax:  [phone]  Name: Marrian Bells MRN: [id number] Date of Birth: 1965-08-31  Manus Gunning, PT 11/26/18 3:57 PM

## 2018-12-03 ENCOUNTER — Ambulatory Visit: Payer: BC Managed Care – PPO | Attending: General Surgery | Admitting: Physical Therapy

## 2018-12-03 ENCOUNTER — Encounter: Payer: Self-pay | Admitting: Physical Therapy

## 2018-12-03 ENCOUNTER — Other Ambulatory Visit: Payer: Self-pay

## 2018-12-03 DIAGNOSIS — M6281 Muscle weakness (generalized): Secondary | ICD-10-CM | POA: Diagnosis present

## 2018-12-03 DIAGNOSIS — R293 Abnormal posture: Secondary | ICD-10-CM

## 2018-12-03 DIAGNOSIS — Z483 Aftercare following surgery for neoplasm: Secondary | ICD-10-CM

## 2018-12-03 DIAGNOSIS — M25612 Stiffness of left shoulder, not elsewhere classified: Secondary | ICD-10-CM | POA: Diagnosis not present

## 2018-12-03 NOTE — Therapy (Signed)
[place], [place], [address] Phone: [phone]   Fax:  [phone]  Physical Therapy Treatment  Patient Details  Name: Roberta [name] MRN: [id number] Date of Birth: 17-Nov-1965 Referring Provider (PT): Dr. Fanny Skates   Encounter Date: 12/03/2018  PT End of Session - 12/03/18 1555    Visit Number  17    Number of Visits  25    Date for PT Re-Evaluation  01/02/19    PT Start Time  1501    PT Stop Time  1555    PT Time Calculation (min)  54 min    Activity Tolerance  Patient tolerated treatment well    Behavior During Therapy  [facility] for tasks assessed/performed       Past Medical History:  Diagnosis Date  . Back pain   . Cancer [facility])    multifocal left breast  . Depression   . Hip pain   . Hypertension   . Obesity   . PONV (postoperative nausea and vomiting)   . Prediabetes   . Swelling    bilat LE  . Wears glasses     Past Surgical History:  Procedure Laterality Date  . CHOLECYSTECTOMY    . COLONOSCOPY    . DILATION AND CURETTAGE OF UTERUS    . GASTRIC BYPASS    . KNEE SURGERY  2002   Ligament repair  . MASTECTOMY W/ SENTINEL NODE BIOPSY Left 10/01/2018   Procedure: LEFT TOTAL MASTECTOMY WITH SENTINEL LYMPH NODE BIOPSY AND BLUE DYE INJECTION;  Surgeon: Fanny Skates, MD;  Location: [place];  Service: General;  Laterality: Left;    There were no vitals filed for this visit.  Subjective Assessment - 12/03/18 1501    Subjective  My shoulder is tight but I have been working it out.    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 Patient underwent a left mastectomy with sentinel node biopsy (0/5 nodes positive) on 10/01/2018. It is ER/PR positive and HER2 negative with a Ki67 of 10%. She had gastric bypass surgery in 2005, is pre-diabetic, and has hypertension.    Patient Stated Goals  Get my arm better    Currently in Pain?  No/denies    Pain Score  0-No pain         [facility] PT Assessment - 12/03/18 0001      AROM   Left Shoulder Flexion  145 Degrees    Left Shoulder ABduction  142 Degrees                   [facility] Adult PT Treatment/Exercise - 12/03/18 0001      Shoulder Exercises: Sidelying   External Rotation  Strengthening;Left;10 reps   v/c to keep elbow tucked at side   ABduction  AROM;Left;10 reps   pt demonstrated improved ROM following scap mobs   Other Sidelying Exercises  small circles with hand pointed to ceiling,       Shoulder Exercises: Standing   Other Standing Exercises  Forearm walking on wall with yellow theraband 8x, then scapular retraction 5x each returning therapist demo and VCs for correct UE position   forearm rolling on wall with foam roller x 10 reps     Shoulder Exercises: Pulleys   Flexion  2 minutes    ABduction  2 minutes      Shoulder Exercises: Therapy Ball   Flexion  Both;10 reps   forward  lean into end of stretch     Shoulder Exercises: Stretch   Wall Stretch - ABduction  5 reps   5 sec holds with finger ladder to 29   Wall Stretch - ABduction Limitations  Tactile cues to decrease Lt scapular compensation; then "snow angels" on wall x5      Manual Therapy   Soft tissue mobilization  to left lateral trunk in area of tightness     Scapular Mobilization  in right sidelying to the left scapula into all directions,     Passive ROM  to L shoulder in direction of flexion, abduction and D2 to pt's tolerance with traction for increased comfort, with prolonged holds and scapular depression throughout                  PT Long Term Goals - 11/01/18 1007      PT LONG TERM GOAL #1   Title  Patient will be independent with a shoulder ROM HEP to improve ROM    Baseline  11/01/2018 : Pt is doing some exercise at home ,but need to progress them    Time  4    Status  Revised      PT LONG TERM GOAL #2   Title  Patient will  increase left shoulder active flexion ROM to >/= 140 degrees for increased ease reaching overhead and to return to baseline.    Baseline  86 taken post op,. 11/01/2018: 90 with shoulder hiking    Time  8    Period  Weeks    Status  New      PT LONG TERM GOAL #3   Title  Patient will increase left active shoulder abduction to >/= 140 degrees for increased ease reaching and to return to baseline.    Baseline  103 degrees post op,11/01/2018 pt shrugs shoulder with attempts had 90 degrees    Time  8      PT LONG TERM GOAL #4   Title  Patient will improve her DASH score to be </= 10 for improved overall upper extremity function.    Baseline  61.36 post-op; 2.27 pre-op    Time  8    Period  Weeks    Status  On-going      PT LONG TERM GOAL #5   Title  Patient will verbalize good understanding of lymphedema risk reduction practices.    Time  8    Period  Weeks    Status  On-going            Plan - 12/03/18 1555    Clinical Impression Statement  Pt felt a very good stretch with foam roller on wall today and was educated to purchase one for use at home. Continued with scapular mobilization today since this is improving AROM and pt has poor scapulohumeral rhythm. Added sidelying ROM exercises following scapular mobilizations to help improve scapular stability. Remeasured ROM at end of session and it has increased greatly since mid October.    PT Frequency  2x / week    PT Duration  8 weeks    PT Treatment/Interventions  ADLs/Self Care Home Management;Therapeutic exercise;Manual techniques;Patient/family education;Moist Heat;Passive range of motion;Scar mobilization    PT Next Visit Plan  do left scapular mobs, continue with scapular strengthening and control for ROM,Progress with closed chain activities for UE (cont forearm walking on wall/add to HEP)  Add core stabilty to allow better scapular stabilit    PT Home Exercise Plan  Post  op shoulder ROM HEP, 3 way shoulder    Consulted and  Agree with Plan of Care  Patient       Patient will benefit from skilled therapeutic intervention in order to improve the following deficits and impairments:  Postural dysfunction, Decreased range of motion, Decreased knowledge of precautions, Impaired UE functional use, Pain, Increased fascial restricitons, Decreased strength  Visit Diagnosis: Stiffness of left shoulder, not elsewhere classified  Abnormal posture  Muscle weakness (generalized)  Aftercare following surgery for neoplasm     Problem List Patient Active Problem List   Diagnosis Date Noted  . Cancer of overlapping sites of left female breast ([place]) 10/01/2018  . Morbid obesity with BMI of 40.0-44.9, adult ([place]) 09/04/2018  . Malignant neoplasm of upper-inner quadrant of left breast in female, estrogen receptor positive ([place]) 09/02/2018  . Vitamin D deficiency 02/04/2018  . Urticaria 01/17/2018  . 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 Left hip pain 02/10/2017  . Anemia 01/21/2017  . 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 Prediabetes 12/13/2015  . Class 3 severe obesity with serious comorbidity and body mass index (BMI) of 40.0 to 44.9 in adult ([place]) 12/13/2015  . Sensation of lump in throat 12/13/2015  . Leg edema 12/13/2015  . Dense breasts 12/13/2011  . 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 Hypertension 11/10/2011    [name] [facility] 12/03/2018, 3:59 PM  [place], [place], [address] Phone: [phone]   Fax:  [phone]  Name: Roberta [name] MRN: [id number] Date of Birth: 07-Nov-1965  Manus Gunning, PT 12/03/18 4:00 PM

## 2018-12-04 ENCOUNTER — Other Ambulatory Visit: Payer: Self-pay | Admitting: Internal Medicine

## 2018-12-05 ENCOUNTER — Ambulatory Visit: Payer: BC Managed Care – PPO

## 2018-12-05 ENCOUNTER — Other Ambulatory Visit: Payer: Self-pay

## 2018-12-05 DIAGNOSIS — M25612 Stiffness of left shoulder, not elsewhere classified: Secondary | ICD-10-CM

## 2018-12-05 DIAGNOSIS — M6281 Muscle weakness (generalized): Secondary | ICD-10-CM

## 2018-12-05 DIAGNOSIS — R293 Abnormal posture: Secondary | ICD-10-CM

## 2018-12-05 NOTE — Therapy (Signed)
[place], [place], [address] Phone: [phone]   Fax:  [phone]  Physical Therapy Treatment  Patient Details  Name: Roberta [name] MRN: [id number] Date of Birth: August 19, 1965 Referring Provider (PT): Dr. Fanny Skates   Encounter Date: 12/05/2018  PT End of Session - 12/05/18 0901    Visit Number  18    Number of Visits  25    Date for PT Re-Evaluation  01/02/19    PT Start Time  0803    PT Stop Time  0901    PT Time Calculation (min)  58 min    Behavior During Therapy  [facility] for tasks assessed/performed       Past Medical History:  Diagnosis Date  . Back pain   . Cancer [facility])    multifocal left breast  . Depression   . Hip pain   . Hypertension   . Obesity   . PONV (postoperative nausea and vomiting)   . Prediabetes   . Swelling    bilat LE  . Wears glasses     Past Surgical History:  Procedure Laterality Date  . CHOLECYSTECTOMY    . COLONOSCOPY    . DILATION AND CURETTAGE OF UTERUS    . GASTRIC BYPASS    . KNEE SURGERY  2002   Ligament repair  . MASTECTOMY W/ SENTINEL NODE BIOPSY Left 10/01/2018   Procedure: LEFT TOTAL MASTECTOMY WITH SENTINEL LYMPH NODE BIOPSY AND BLUE DYE INJECTION;  Surgeon: Fanny Skates, MD;  Location: [facility];  Service: General;  Laterality: Left;    There were no vitals filed for this visit.  Subjective Assessment - 12/05/18 0804    Subjective  I ordered a foam roller for home. I felt good after last session. 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 Patient underwent a left mastectomy with sentinel node biopsy (0/5 nodes positive) on 10/01/2018. It is ER/PR positive and HER2 negative with a Ki67 of 10%. She had gastric bypass surgery in 2005, is  pre-diabetic, and has hypertension.    Patient Stated Goals  Get my arm better    Currently in Pain?  No/denies                       [facility] Adult PT Treatment/Exercise - 12/05/18 0001      Shoulder Exercises: Standing   Other Standing Exercises  Forearm walking on wall with yellow theraband 8x, then scapular retraction 5x each returning therapist demo and VCs for correct UE position; forearms on foam roller on wall for stretch 8x      Shoulder Exercises: Pulleys   Flexion  2 minutes    Flexion Limitations  VCs to decrease Lt scapular compensation    ABduction  2 minutes    ABduction Limitations  VCs to decrease Lt scapular compensation      Shoulder Exercises: Therapy Ball   Flexion  Both;10 reps   1# on Lt wrist, forward lean into end stretch   Flexion Limitations  Demo and tactile cues to decrease Lt scapular compensation      Shoulder Exercises: Stretch   Wall Stretch - ABduction  5 reps   up to #22 with less compensation today   Wall  Stretch - ABduction Limitations  Tactile cues to decrease Lt scapular compensation; then "snow angels" on wall x5 for 5 sec holds      Manual Therapy   Soft tissue mobilization  to left lateral trunk in area of tightness     Scapular Mobilization  in right sidelying to the left scapula into all directions,     Passive ROM  to L shoulder in direction of flexion, abduction and D2 to pt's tolerance with traction for increased comfort, with prolonged holds and scapular depression throughout                  PT Long Term Goals - 11/01/18 1007      PT LONG TERM GOAL #1   Title  Patient will be independent with a shoulder ROM HEP to improve ROM    Baseline  11/01/2018 : Pt is doing some exercise at home ,but need to progress them    Time  4    Status  Revised      PT LONG TERM GOAL #2   Title  Patient will increase left shoulder active flexion ROM to >/= 140 degrees for increased ease reaching overhead and to return to  baseline.    Baseline  86 taken post op,. 11/01/2018: 90 with shoulder hiking    Time  8    Period  Weeks    Status  New      PT LONG TERM GOAL #3   Title  Patient will increase left active shoulder abduction to >/= 140 degrees for increased ease reaching and to return to baseline.    Baseline  103 degrees post op,11/01/2018 pt shrugs shoulder with attempts had 90 degrees    Time  8      PT LONG TERM GOAL #4   Title  Patient will improve her DASH score to be </= 10 for improved overall upper extremity function.    Baseline  61.36 post-op; 2.27 pre-op    Time  8    Period  Weeks    Status  On-going      PT LONG TERM GOAL #5   Title  Patient will verbalize good understanding of lymphedema risk reduction practices.    Time  8    Period  Weeks    Status  On-going            Plan - 12/05/18 0902    Clinical Impression Statement  Continued with scapular stability exercises with A/ROM. Pt stil requiring mod tactile and VCs to decrease Lt scapular compensations and for correct scapular rhythm throughout exs. Also continued with manual therapy to work on decreasing tissue tightness at end Lt shoulder P/ROMs. Pt reports feeling good stretches with all activites today and being challenged by sacpular strengthening exs.    Personal Factors and Comorbidities  Other   lives alone   Stability/Clinical Decision Making  Stable/Uncomplicated    Rehab Potential  Excellent    PT Frequency  2x / week    PT Duration  8 weeks    PT Treatment/Interventions  ADLs/Self Care Home Management;Therapeutic exercise;Manual techniques;Patient/family education;Moist Heat;Passive range of motion;Scar mobilization    PT Next Visit Plan  Cont left scapular mobs and scapular strengthening and control for ROM,Progress with closed chain activities for UE (cont forearm walking on wall/add to HEP)  Add core stabilty to allow better scapular stabilit    PT Home Exercise Plan  Post op shoulder ROM HEP, 3 way shoulder  Consulted and Agree with Plan of Care  Patient       Patient will benefit from skilled therapeutic intervention in order to improve the following deficits and impairments:  Postural dysfunction, Decreased range of motion, Decreased knowledge of precautions, Impaired UE functional use, Pain, Increased fascial restricitons, Decreased strength  Visit Diagnosis: Stiffness of left shoulder, not elsewhere classified  Abnormal posture  Muscle weakness (generalized)     Problem List Patient Active Problem List   Diagnosis Date Noted  . Cancer of overlapping sites of left female breast ([place]) 10/01/2018  . Morbid obesity with BMI of 40.0-44.9, adult ([place]) 09/04/2018  . Malignant neoplasm of upper-inner quadrant of left breast in female, estrogen receptor positive ([place]) 09/02/2018  . Vitamin D deficiency 02/04/2018  . Urticaria 01/17/2018  . 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 Left hip pain 02/10/2017  . Anemia 01/21/2017  . 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 Prediabetes 12/13/2015  . Class 3 severe obesity with serious comorbidity and body mass index (BMI) of 40.0 to 44.9 in adult ([place]) 12/13/2015  . Sensation of lump in throat 12/13/2015  . Leg edema 12/13/2015  . Dense breasts 12/13/2011  . 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 Hypertension 11/10/2011    Otelia Limes, PTA 12/05/2018, 9:05 AM  [place], [place], [address] Phone: [phone]   Fax:  [phone]  Name: Roberta [name] MRN: [id number] Date of Birth: 1965-08-23

## 2018-12-06 ENCOUNTER — Encounter: Payer: BC Managed Care – PPO | Admitting: Physical Therapy

## 2018-12-09 ENCOUNTER — Encounter (INDEPENDENT_AMBULATORY_CARE_PROVIDER_SITE_OTHER): Payer: Self-pay | Admitting: Family Medicine

## 2018-12-09 ENCOUNTER — Ambulatory Visit (INDEPENDENT_AMBULATORY_CARE_PROVIDER_SITE_OTHER): Payer: BC Managed Care – PPO | Admitting: Family Medicine

## 2018-12-09 ENCOUNTER — Other Ambulatory Visit: Payer: Self-pay

## 2018-12-09 VITALS — BP 105/69 | HR 78 | Temp 98.5°F | Ht 66.0 in | Wt 262.0 lb

## 2018-12-09 DIAGNOSIS — R7303 Prediabetes: Secondary | ICD-10-CM | POA: Diagnosis not present

## 2018-12-09 DIAGNOSIS — Z6841 Body Mass Index (BMI) 40.0 and over, adult: Secondary | ICD-10-CM

## 2018-12-09 DIAGNOSIS — Z9189 Other specified personal risk factors, not elsewhere classified: Secondary | ICD-10-CM

## 2018-12-09 DIAGNOSIS — F419 Anxiety disorder, unspecified: Secondary | ICD-10-CM

## 2018-12-09 DIAGNOSIS — F32A Depression, unspecified: Secondary | ICD-10-CM

## 2018-12-09 DIAGNOSIS — F329 Major depressive disorder, single episode, unspecified: Secondary | ICD-10-CM

## 2018-12-09 MED ORDER — METFORMIN HCL 500 MG PO TABS: 500.0000 mg | ORAL_TABLET | Freq: Every day | ORAL | 0 refills | Status: DC

## 2018-12-09 NOTE — Progress Notes (Signed)
Office: [phone]  /  Fax: [phone]   HPI:   Chief Complaint: OBESITY Roberta [name] is here to discuss her progress with her obesity treatment plan. She is on the Category 2 plan and is following her eating plan approximately 85 to 90 % of the time. She states she is walking 30 minutes 7 times per week. Roberta [name] admits to being somewhat off the plan over the holidays. 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 Her friend did bring her lemon bars which got her off track. Roberta [name] admits to not drinking enough water. Her weight is 262 lb (118.8 kg) today and has had a weight gain of 1 pound over a period of 3 weeks since her last visit. She has lost 30 lbs since starting treatment with [place].  Pre-Diabetes Roberta [name] has a diagnosis of prediabetes based on her elevated Hgb A1c and was informed this puts her at greater risk of developing diabetes. Roberta [name] is on metformin and she denies nausea, vomiting or diarrhea. Roberta [name] continues to work on diet and exercise to decrease risk of diabetes. She denies polyphagia. Lab Results  Component Value Date   HGBA1C 6.0 (H) 09/25/2018  .  At risk for diabetes Roberta [name] is at higher than average risk for developing diabetes due to her obesity and Prediabetes.   Anxiety and Depression Roberta [name] has anxiety and depression. Her mood is stable on Zoloft. She is trying to get established with an online breast cancer support group but they have not returned her call. Roberta [name] reports increased stress due to being back at work. She admits to some stress eating. Roberta [name] is working from home.  ASSESSMENT AND PLAN:  Prediabetes - Plan: metFORMIN (GLUCOPHAGE) 500 MG tablet  Anxiety and depression  At risk for diabetes mellitus  Class 3 severe obesity with serious comorbidity and body mass index (BMI) of 40.0 to 44.9 in adult, unspecified obesity type ([place])  PLAN:  Pre-Diabetes Roberta [name] will continue to work on weight loss, exercise, and  decreasing simple carbohydrates to help decrease the risk of diabetes.   Diabetes risk counseling (~15 min) Roberta [name] is a 53 y.o. female and has risk factors for diabetes including obesity and prediabetes. We discussed intensive lifestyle modifications today with an emphasis on weight loss as well as increasing exercise and decreasing simple carbohydrates in her diet.  Anxiety and Depression Roberta [name] will continue Zoloft and she will follow up with our clinic in 2 weeks.  Obesity Roberta [name] is currently in the action stage of change. As such, her goal is to continue with weight loss efforts She has agreed to follow the Category 2 plan Roberta [name] has been instructed to work up to a goal of 150 minutes of combined cardio and strengthening exercise per week for weight loss and overall health benefits. We discussed the following Behavioral Modification Strategies today: planning for success, increase H2O intake, increasing lean protein intake and dealing with family or coworker sabotage  Roberta [name] has agreed to follow up with our clinic in 2 weeks. She was informed of the importance of frequent follow up visits to maximize her success with intensive lifestyle modifications for her multiple health conditions.  ALLERGIES: Allergies  Allergen Reactions  . Lisinopril Swelling    Swelling of the tongue  . Penicillins     UNSPECIFIED CHILDHOOD REACTION  . Norvasc [Amlodipine Besylate] Rash  . Red Dye Rash  . Tetanus Toxoids Swelling    Swelling at site of injection  MEDICATIONS: Current Outpatient Medications on File Prior to Visit  Medication Sig Dispense Refill  . Biotin 10000 MCG TABS Take 10,000 mcg by mouth at bedtime.     . Cholecalciferol (VITAMIN D-3) 5000 units TABS Take 1 tablet by mouth daily. (Patient taking differently: Take 5,000 Units by mouth daily. ) 30 tablet   . losartan-hydrochlorothiazide (HYZAAR) 100-25 MG tablet Take 1 tablet by mouth daily. 90 tablet 1  . Multiple Vitamin  (MULTIVITAMIN) tablet Take 1 tablet by mouth daily.    . Omega-3 Fatty Acids (OMEGA 3 500 PO) Take 500 mg by mouth daily.     . sertraline (ZOLOFT) 25 MG tablet Take 25 mg by mouth daily.    . tamoxifen (NOLVADEX) 20 MG tablet Take 1 tablet (20 mg total) by mouth daily. 30 tablet 3  . vitamin E 100 UNIT capsule Take 180 Units by mouth at bedtime.     Marland Kitchen zinc gluconate 50 MG tablet Take 50 mg by mouth at bedtime.      No current facility-administered medications on file prior to visit.     PAST MEDICAL HISTORY: Past Medical History:  Diagnosis Date  . Back pain   . Cancer Roberta [name])    multifocal left breast  . Depression   . Hip pain   . Hypertension   . Obesity   . PONV (postoperative nausea and vomiting)   . Prediabetes   . Swelling    bilat LE  . Wears glasses     PAST SURGICAL HISTORY: Past Surgical History:  Procedure Laterality Date  . CHOLECYSTECTOMY    . COLONOSCOPY    . DILATION AND CURETTAGE OF UTERUS    . GASTRIC BYPASS    . KNEE SURGERY  2002   Ligament repair  . MASTECTOMY W/ SENTINEL NODE BIOPSY Left 10/01/2018   Procedure: LEFT TOTAL MASTECTOMY WITH SENTINEL LYMPH NODE BIOPSY AND BLUE DYE INJECTION;  Surgeon: Fanny Skates, MD;  Location: [facility];  Service: General;  Laterality: Left;    SOCIAL HISTORY: Social History   Tobacco Use  . Smoking status: Never Smoker  . Smokeless tobacco: Never Used  Substance Use Topics  . Alcohol use: Yes    Comment: occasional wine  . Drug use: No    FAMILY HISTORY: Family History  Problem Relation Age of Onset  . Diabetes Mother   . Hypertension Mother   . Heart disease Mother   . Stroke Mother   . Kidney disease Mother   . Obesity Mother   . Cancer Maternal Aunt        lung  . Cancer Maternal Uncle        lung  . Throat cancer Maternal Aunt     ROS: Review of Systems  Constitutional: Negative for weight loss.  Gastrointestinal: Negative for diarrhea, nausea and vomiting.  Genitourinary: Negative for  frequency.  Endo/Heme/Allergies: Negative for polydipsia.       Negative for polyphagia    PHYSICAL EXAM: Blood pressure 105/69, pulse 78, temperature 98.5 F (36.9 C), temperature source Oral, height 5\' 6"  (1.676 m), weight 262 lb (118.8 kg), SpO2 98 %. Body mass index is 42.29 kg/m. Physical Exam Vitals signs reviewed.  Constitutional:      Appearance: Normal appearance. She is well-developed. She is obese.  Cardiovascular:     Rate and Rhythm: Normal rate.  Pulmonary:     Effort: Pulmonary effort is normal.  Musculoskeletal: Normal range of motion.  Skin:    General: Skin is warm and  dry.  Neurological:     Mental Status: She is alert and oriented to person, place, and time.  Psychiatric:        Mood and Affect: Mood normal.        Behavior: Behavior normal.     RECENT LABS AND TESTS: BMET    Component Value Date/Time   NA 137 10/31/2018 1220   NA 139 07/17/2018 1547   K 3.6 10/31/2018 1220   CL 103 10/31/2018 1220   CO2 26 10/31/2018 1220   GLUCOSE 88 10/31/2018 1220   BUN 15 10/31/2018 1220   BUN 16 07/17/2018 1547   CREATININE 0.84 10/31/2018 1220   CREATININE 0.95 09/04/2018 0840   CALCIUM 9.0 10/31/2018 1220   GFRNONAA >60 10/31/2018 1220   GFRNONAA >60 09/04/2018 0840   GFRAA >60 10/31/2018 1220   GFRAA >60 09/04/2018 0840   Lab Results  Component Value Date   HGBA1C 6.0 (H) 09/25/2018   HGBA1C 5.9 (H) 07/17/2018   HGBA1C 6.1 (H) 01/31/2018   HGBA1C 6.0 (H) 10/08/2017   HGBA1C 6.3 06/25/2017   Lab Results  Component Value Date   INSULIN 13.4 01/31/2018   INSULIN 10.6 10/08/2017   CBC    Component Value Date/Time   WBC 4.8 10/31/2018 1220   RBC 4.41 10/31/2018 1220   HGB 11.7 (L) 10/31/2018 1220   HGB 11.8 (L) 09/04/2018 0840   HGB 12.3 07/17/2018 1547   HCT 36.2 10/31/2018 1220   HCT 38.5 07/17/2018 1547   PLT 275 10/31/2018 1220   PLT 260 09/04/2018 0840   MCV 82.1 10/31/2018 1220   MCV 83 07/17/2018 1547   MCH 26.5 10/31/2018 1220    MCHC 32.3 10/31/2018 1220   RDW 14.7 10/31/2018 1220   RDW 14.9 07/17/2018 1547   LYMPHSABS 1.5 10/31/2018 1220   LYMPHSABS 1.4 07/17/2018 1547   MONOABS 0.6 10/31/2018 1220   EOSABS 0.1 10/31/2018 1220   EOSABS 0.1 07/17/2018 1547   BASOSABS 0.0 10/31/2018 1220   BASOSABS 0.0 07/17/2018 1547   Iron/TIBC/Ferritin/ %Sat    Component Value Date/Time   IRON 162 (H) 01/22/2017 1152   FERRITIN 14.5 01/22/2017 1152   Lipid Panel     Component Value Date/Time   CHOL 112 10/08/2017 1239   TRIG 69 10/08/2017 1239   HDL 50 10/08/2017 1239   CHOLHDL 2 06/25/2017 1541   VLDL 15.0 06/25/2017 1541   LDLCALC 48 10/08/2017 1239   Hepatic Function Panel     Component Value Date/Time   PROT 7.2 10/31/2018 1220   PROT 6.9 07/17/2018 1547   ALBUMIN 3.2 (L) 10/31/2018 1220   ALBUMIN 3.6 (L) 07/17/2018 1547   AST 17 10/31/2018 1220   AST 13 (L) 09/04/2018 0840   ALT 13 10/31/2018 1220   ALT 10 09/04/2018 0840   ALKPHOS 79 10/31/2018 1220   BILITOT 0.4 10/31/2018 1220   BILITOT 0.4 09/04/2018 0840      Component Value Date/Time   TSH 1.480 10/08/2017 1239   TSH 1.24 06/25/2017 1541   TSH 1.53 07/11/2016 1150     Ref. Range 07/17/2018 15:47  Vitamin D, 25-Hydroxy Latest Ref Range: 30.0 - 100.0 ng/mL 70.2    OBESITY BEHAVIORAL INTERVENTION VISIT  Today's visit was # 26  Starting weight: 292 lbs Starting date: 10/08/2017 Today's weight : 262 lbs Today's date: 12/09/2018 Total lbs lost to date: 30    12/09/2018  Height 5\' 6"  (1.676 m)  Weight 262 lb (118.8 kg)  BMI (Calculated) 42.[place]  BLOOD PRESSURE - SYSTOLIC 123456  BLOOD PRESSURE - DIASTOLIC 69   Body Fat % 46 %  Total Body Water (lbs) 91.4 lbs    ASK: We discussed the diagnosis of obesity with Waldemar Dickens today and Padee agreed to give [place] permission to discuss obesity behavioral modification therapy today.  ASSESS: Auryn has the diagnosis of obesity and her BMI today is 42.31 [place] is in the action stage of  change   ADVISE: Isley was educated on the multiple health risks of obesity as well as the benefit of weight loss to improve her health. She was advised of the need for long term treatment and the importance of lifestyle modifications to improve her current health and to decrease her risk of future health problems.  AGREE: Multiple dietary modification options and treatment options were discussed and  Ilaria agreed to follow the recommendations documented in the above note.  ARRANGE: Dynasia was educated on the importance of frequent visits to treat obesity as outlined per CMS and USPSTF guidelines and agreed to schedule her next follow up appointment today.  I, Doreene Nest, am acting as transcriptionist for [employer], FNP-C  I have reviewed the above documentation for accuracy and completeness, and I agree with the above.  - Calvin Jablonowski, FNP-C.

## 2018-12-10 ENCOUNTER — Ambulatory Visit: Payer: BC Managed Care – PPO | Admitting: Physical Therapy

## 2018-12-10 ENCOUNTER — Encounter (INDEPENDENT_AMBULATORY_CARE_PROVIDER_SITE_OTHER): Payer: Self-pay | Admitting: Family Medicine

## 2018-12-10 ENCOUNTER — Encounter: Payer: Self-pay | Admitting: Physical Therapy

## 2018-12-10 DIAGNOSIS — F329 Major depressive disorder, single episode, unspecified: Secondary | ICD-10-CM | POA: Insufficient documentation

## 2018-12-10 DIAGNOSIS — F419 Anxiety disorder, unspecified: Secondary | ICD-10-CM | POA: Insufficient documentation

## 2018-12-10 DIAGNOSIS — M25612 Stiffness of left shoulder, not elsewhere classified: Secondary | ICD-10-CM | POA: Diagnosis not present

## 2018-12-10 DIAGNOSIS — F32A Depression, unspecified: Secondary | ICD-10-CM | POA: Insufficient documentation

## 2018-12-10 DIAGNOSIS — Z483 Aftercare following surgery for neoplasm: Secondary | ICD-10-CM

## 2018-12-10 DIAGNOSIS — M6281 Muscle weakness (generalized): Secondary | ICD-10-CM

## 2018-12-10 DIAGNOSIS — R293 Abnormal posture: Secondary | ICD-10-CM

## 2018-12-10 NOTE — Therapy (Signed)
[place], [place], [address] Phone: [phone]   Fax:  [phone]  Physical Therapy Treatment  Patient Details  Name: Roberta [name] MRN: [id number] Date of Birth: [birth year] Referring Provider (PT): Dr. Fanny Skates   Encounter Date: 12/10/2018  PT End of Session - 12/10/18 0850    Visit Number  19    Number of Visits  25    Date for PT Re-Evaluation  01/02/19    PT Start Time  0804    PT Stop Time  0850    PT Time Calculation (min)  46 min    Activity Tolerance  Patient tolerated treatment well    Behavior During Therapy  [facility] for tasks assessed/performed       Past Medical History:  Diagnosis Date  . Back pain   . Cancer [facility])    multifocal left breast  . Depression   . Hip pain   . Hypertension   . Obesity   . PONV (postoperative nausea and vomiting)   . Prediabetes   . Swelling    bilat LE  . Wears glasses     Past Surgical History:  Procedure Laterality Date  . CHOLECYSTECTOMY    . COLONOSCOPY    . DILATION AND CURETTAGE OF UTERUS    . GASTRIC BYPASS    . KNEE SURGERY  2002   Ligament repair  . MASTECTOMY W/ SENTINEL NODE BIOPSY Left 10/01/2018   Procedure: LEFT TOTAL MASTECTOMY WITH SENTINEL LYMPH NODE BIOPSY AND BLUE DYE INJECTION;  Surgeon: Fanny Skates, MD;  Location: [place];  Service: General;  Laterality: Left;    There were no vitals filed for this visit.  Subjective Assessment - 12/10/18 0806    Subjective  The foam roller felt good. I did it last night before bed. I am still tight today.    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 Patient underwent a left mastectomy with sentinel node biopsy (0/5 nodes positive) on 10/01/2018. It is ER/PR positive and HER2 negative with a Ki67 of 10%. She had gastric bypass surgery in 2005, is pre-diabetic, and has hypertension.    Patient Stated Goals   Get my arm better    Currently in Pain?  No/denies    Pain Score  0-No pain                       [facility] Adult PT Treatment/Exercise - 12/10/18 0001      Shoulder Exercises: Sidelying   External Rotation  AROM;Left;10 reps    ABduction  AROM;Left;10 reps   with 5 sec holds for stretch     Shoulder Exercises: Standing   Other Standing Exercises  Forearm walking on wall with yellow theraband 5x, then scapular retraction 5x each returning therapist demo and VCs for correct UE position; forearms on foam roller on wall for stretch 10x      Shoulder Exercises: Pulleys   Flexion  2 minutes    ABduction  2 minutes      Shoulder Exercises: Therapy Ball   Flexion  Both;10 reps   1# on Lt wrist, forward lean into end stretch     Shoulder Exercises: Stretch   Wall Stretch - ABduction  5 reps    Wall Stretch - ABduction Limitations  Tactile cues to decrease Lt scapular compensation; then "snow angels" on wall x5 for 5 sec holds  Manual Therapy   Soft tissue mobilization  to left lateral trunk in area of tightness     Scapular Mobilization  in right sidelying to the left scapula into all directions,     Passive ROM  to L shoulder in direction of flexion, abduction and D2 to pt's tolerance with traction for increased comfort, with prolonged holds and scapular depression throughout   pt had discomfort in elbow during Er                 PT Long Term Goals - 11/01/18 1007      PT LONG TERM GOAL #1   Title  Patient will be independent with a shoulder ROM HEP to improve ROM    Baseline  11/01/2018 : Pt is doing some exercise at home ,but need to progress them    Time  4    Status  Revised      PT LONG TERM GOAL #2   Title  Patient will increase left shoulder active flexion ROM to >/= 140 degrees for increased ease reaching overhead and to return to baseline.    Baseline  86 taken post op,. 11/01/2018: 90 with shoulder hiking    Time  8    Period  Weeks     Status  New      PT LONG TERM GOAL #3   Title  Patient will increase left active shoulder abduction to >/= 140 degrees for increased ease reaching and to return to baseline.    Baseline  103 degrees post op,11/01/2018 pt shrugs shoulder with attempts had 90 degrees    Time  8      PT LONG TERM GOAL #4   Title  Patient will improve her DASH score to be </= 10 for improved overall upper extremity function.    Baseline  61.36 post-op; 2.27 pre-op    Time  8    Period  Weeks    Status  On-going      PT LONG TERM GOAL #5   Title  Patient will verbalize good understanding of lymphedema risk reduction practices.    Time  8    Period  Weeks    Status  On-going            Plan - 12/10/18 0853    Clinical Impression Statement  Pt is still demonstrating L scapular compensation and tends to hike left shoulder during PROM requiring frequent v/c cues to keep shoulders muscles engaged to avoid shoulder hiking. Pt does very well with scapular mobilization and AROM in sidelying as this does not cause pain in upper shoulder.    PT Frequency  2x / week    PT Duration  8 weeks    PT Treatment/Interventions  ADLs/Self Care Home Management;Therapeutic exercise;Manual techniques;Patient/family education;Moist Heat;Passive range of motion;Scar mobilization    PT Next Visit Plan  Cont left scapular mobs and scapular strengthening and control for ROM,Progress with closed chain activities for UE (cont forearm walking on wall/add to HEP)  Add core stabilty to allow better scapular stabilit    Consulted and Agree with Plan of Care  Patient       Patient will benefit from skilled therapeutic intervention in order to improve the following deficits and impairments:     Visit Diagnosis: Stiffness of left shoulder, not elsewhere classified  Abnormal posture  Muscle weakness (generalized)  Aftercare following surgery for neoplasm     Problem List Patient Active Problem List   Diagnosis Date Noted   .  Cancer of overlapping sites of left female breast ([place]) 10/01/2018  . Morbid obesity with BMI of 40.0-44.9, adult ([place]) 09/04/2018  . Malignant neoplasm of upper-inner quadrant of left breast in female, estrogen receptor positive ([place]) 09/02/2018  . Vitamin D deficiency 02/04/2018  . Urticaria 01/17/2018  . 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 Left hip pain 02/10/2017  . Anemia 01/21/2017  . 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 Prediabetes 12/13/2015  . Class 3 severe obesity with serious comorbidity and body mass index (BMI) of 40.0 to 44.9 in adult ([place]) 12/13/2015  . Sensation of lump in throat 12/13/2015  . Leg edema 12/13/2015  . Dense breasts 12/13/2011  . 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 Hypertension 11/10/2011    [name] [place] [facility] 12/10/2018, 8:56 AM  [place], [place], [address] Phone: [phone]   Fax:  [phone]  Name: Mistey Hoffert MRN: [id number] Date of Birth: [birth year]  Manus Gunning, PT 12/10/18 8:56 AM

## 2018-12-13 ENCOUNTER — Other Ambulatory Visit: Payer: Self-pay

## 2018-12-13 ENCOUNTER — Encounter: Payer: Self-pay | Admitting: Physical Therapy

## 2018-12-13 ENCOUNTER — Ambulatory Visit: Payer: BC Managed Care – PPO | Admitting: Physical Therapy

## 2018-12-13 DIAGNOSIS — M25612 Stiffness of left shoulder, not elsewhere classified: Secondary | ICD-10-CM

## 2018-12-13 DIAGNOSIS — R293 Abnormal posture: Secondary | ICD-10-CM

## 2018-12-13 NOTE — Therapy (Addendum)
[place], [place], [address] Phone: [phone]   Fax:  [phone]  Physical Therapy Treatment  Patient Details  Name: Roberta [name] MRN: [id number] Date of Birth: [birth year] Referring Provider (PT): Dr. Fanny Skates   Encounter Date: 12/13/2018  PT End of Session - 12/13/18 1046    Visit Number  20    Number of Visits  25    Date for PT Re-Evaluation  01/02/19    PT Start Time  0958    PT Stop Time  1046    PT Time Calculation (min)  48 min    Activity Tolerance  Patient tolerated treatment well    Behavior During Therapy  [facility] for tasks assessed/performed       Past Medical History:  Diagnosis Date  . Back pain   . Cancer [facility])    multifocal left breast  . Depression   . Hip pain   . Hypertension   . Obesity   . PONV (postoperative nausea and vomiting)   . Prediabetes   . Swelling    bilat LE  . Wears glasses     Past Surgical History:  Procedure Laterality Date  . CHOLECYSTECTOMY    . COLONOSCOPY    . DILATION AND CURETTAGE OF UTERUS    . GASTRIC BYPASS    . KNEE SURGERY  2002   Ligament repair  . MASTECTOMY W/ SENTINEL NODE BIOPSY Left 10/01/2018   Procedure: LEFT TOTAL MASTECTOMY WITH SENTINEL LYMPH NODE BIOPSY AND BLUE DYE INJECTION;  Surgeon: Fanny Skates, MD;  Location: [place];  Service: General;  Laterality: Left;    There were no vitals filed for this visit.  Subjective Assessment - 12/13/18 0958    Subjective  I have pain in my shoulder and my elbow today.    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 Patient underwent a left mastectomy with sentinel node biopsy (0/5 nodes positive) on 10/01/2018. It is ER/PR positive and HER2 negative with a Ki67 of 10%. She had gastric bypass surgery in 2005, is pre-diabetic, and has hypertension.    Patient Stated Goals  Get my arm better    Currently in  Pain?  Yes    Pain Score  2     Pain Location  Elbow    Pain Orientation  Left    Pain Descriptors / Indicators  Dull;Constant;Aching    Pain Type  Acute pain    Pain Onset  Yesterday    Pain Frequency  Constant                       [facility] Adult PT Treatment/Exercise - 12/13/18 0001      Shoulder Exercises: Standing   Other Standing Exercises  Forearm walking on wall with yellow theraband 5x, then scapular retraction 5x each returning therapist demo and VCs for correct UE position; forearms on foam roller on wall for stretch 10x      Shoulder Exercises: Pulleys   Flexion  2 minutes    ABduction  2 minutes      Shoulder Exercises: Therapy Ball   Flexion  Both;10 reps   2# on Lt wrist, forward lean into end stretch     Shoulder Exercises: Stretch   Wall Stretch - ABduction  5 reps    Wall Stretch - ABduction Limitations  Tactile cues to decrease Lt scapular compensation; then "snow angels"  on wall x5 for 5 sec holds      Manual Therapy   Manual Therapy  Neural Stretch    Soft tissue mobilization  to left lateral trunk in area of tightness     Scapular Mobilization  in right sidelying to the left scapula into all directions,     Passive ROM  to L shoulder in direction of flexion, abduction and D2 to pt's tolerance with traction for increased comfort, with prolonged holds and scapular depression throughout   pt had discomfort in elbow during Er   Neural Stretch  to ulnar and medial nerve in supine and instructed pt in ulnar nerve self stretch today                  PT Long Term Goals - 11/01/18 1007      PT LONG TERM GOAL #1   Title  Patient will be independent with a shoulder ROM HEP to improve ROM    Baseline  11/01/2018 : Pt is doing some exercise at home ,but need to progress them    Time  4    Status  Revised      PT LONG TERM GOAL #2   Title  Patient will increase left shoulder active flexion ROM to >/= 140 degrees for increased ease reaching  overhead and to return to baseline.    Baseline  86 taken post op,. 11/01/2018: 90 with shoulder hiking    Time  8    Period  Weeks    Status  New      PT LONG TERM GOAL #3   Title  Patient will increase left active shoulder abduction to >/= 140 degrees for increased ease reaching and to return to baseline.    Baseline  103 degrees post op,11/01/2018 pt shrugs shoulder with attempts had 90 degrees    Time  8      PT LONG TERM GOAL #4   Title  Patient will improve her DASH score to be </= 10 for improved overall upper extremity function.    Baseline  61.36 post-op; 2.27 pre-op    Time  8    Period  Weeks    Status  On-going      PT LONG TERM GOAL #5   Title  Patient will verbalize good understanding of lymphedema risk reduction practices.    Time  8    Period  Weeks    Status  On-going            Plan - 12/13/18 1047    Clinical Impression Statement  Pt is more tight today in left shoulder and across pec. She has been having elbow pain that has started in the last week with active and prom. This may be due to nerve tightness. Performed nerve gliding today with pt reporting some improvement in pain. Continued with AAROM exercises and manual therapy to improve left shoulder ROM. Worked on scapular mobility to improve scapulohumeral rhythm today. Pt felt more loose at end of session.    PT Frequency  2x / week    PT Duration  8 weeks    PT Treatment/Interventions  ADLs/Self Care Home Management;Therapeutic exercise;Manual techniques;Patient/family education;Moist Heat;Passive range of motion;Scar mobilization    PT Next Visit Plan  update goals and decide on adding more appts, Cont left scapular mobs and scapular strengthening and control for ROM,Progress with closed chain activities for UE (cont forearm walking on wall/add to HEP)  Add core stabilty to allow better  scapular stabilit    PT Home Exercise Plan  Post op shoulder ROM HEP, 3 way shoulder    Consulted and Agree with Plan  of Care  Patient       Patient will benefit from skilled therapeutic intervention in order to improve the following deficits and impairments:  Postural dysfunction, Decreased range of motion, Decreased knowledge of precautions, Impaired UE functional use, Pain, Increased fascial restricitons, Decreased strength  Visit Diagnosis: Stiffness of left shoulder, not elsewhere classified  Abnormal posture     Problem List Patient Active Problem List   Diagnosis Date Noted  . Anxiety and depression 12/10/2018  . Cancer of overlapping sites of left female breast ([place]) 10/01/2018  . Morbid obesity with BMI of 40.0-44.9, adult ([place]) 09/04/2018  . Malignant neoplasm of upper-inner quadrant of left breast in female, estrogen receptor positive ([place]) 09/02/2018  . Vitamin D deficiency 02/04/2018  . Urticaria 01/17/2018  . 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 Left hip pain 02/10/2017  . Anemia 01/21/2017  . 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 Prediabetes 12/13/2015  . Class 3 severe obesity with serious comorbidity and body mass index (BMI) of 40.0 to 44.9 in adult ([place]) 12/13/2015  . Sensation of lump in throat 12/13/2015  . Leg edema 12/13/2015  . Dense breasts 12/13/2011  . 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 Hypertension 11/10/2011    [name] [facility] 12/13/2018, 10:54 AM  [place], [place], [address] Phone: [phone]   Fax:  [phone]  Name: Roberta [name] MRN: [id number] Date of Birth: 12/06/1965  Manus Gunning, PT 12/13/18 10:54 AM

## 2018-12-17 ENCOUNTER — Encounter: Payer: Self-pay | Admitting: Physical Therapy

## 2018-12-17 ENCOUNTER — Other Ambulatory Visit: Payer: Self-pay

## 2018-12-17 ENCOUNTER — Ambulatory Visit: Payer: BC Managed Care – PPO | Admitting: Physical Therapy

## 2018-12-17 DIAGNOSIS — M6281 Muscle weakness (generalized): Secondary | ICD-10-CM

## 2018-12-17 DIAGNOSIS — Z483 Aftercare following surgery for neoplasm: Secondary | ICD-10-CM

## 2018-12-17 DIAGNOSIS — M25612 Stiffness of left shoulder, not elsewhere classified: Secondary | ICD-10-CM | POA: Diagnosis not present

## 2018-12-17 DIAGNOSIS — R293 Abnormal posture: Secondary | ICD-10-CM

## 2018-12-17 NOTE — Therapy (Addendum)
[place], [place], [address] Phone: [phone]   Fax:  [phone]  Physical Therapy Treatment  Patient Details  Name: Roberta [name] MRN: [id number] Date of Birth: 1965/04/24 Referring Provider (PT): Dr. Fanny Skates   Encounter Date: 12/17/2018  PT End of Session - 12/17/18 0854    Visit Number  21    Number of Visits  25    Date for PT Re-Evaluation  01/02/19    PT Start Time  0803    PT Stop Time  0848    PT Time Calculation (min)  45 min    Activity Tolerance  Patient tolerated treatment well    Behavior During Therapy  [facility] for tasks assessed/performed       Past Medical History:  Diagnosis Date  . Back pain   . Cancer [facility])    multifocal left breast  . Depression   . Hip pain   . Hypertension   . Obesity   . PONV (postoperative nausea and vomiting)   . Prediabetes   . Swelling    bilat LE  . Wears glasses     Past Surgical History:  Procedure Laterality Date  . CHOLECYSTECTOMY    . COLONOSCOPY    . DILATION AND CURETTAGE OF UTERUS    . GASTRIC BYPASS    . KNEE SURGERY  2002   Ligament repair  . MASTECTOMY W/ SENTINEL NODE BIOPSY Left 10/01/2018   Procedure: LEFT TOTAL MASTECTOMY WITH SENTINEL LYMPH NODE BIOPSY AND BLUE DYE INJECTION;  Surgeon: Fanny Skates, MD;  Location: [place];  Service: General;  Laterality: Left;    There were no vitals filed for this visit.  Subjective Assessment - 12/17/18 0803    Subjective  Across my chest is as tight as always.    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 Patient underwent a left mastectomy with sentinel node biopsy (0/5 nodes positive) on 10/01/2018. It is ER/PR positive and HER2 negative with a Ki67 of 10%. She had gastric bypass surgery in 2005, is pre-diabetic, and has hypertension.    Patient Stated Goals  Get my arm better    Currently in Pain?   No/denies    Pain Score  0-No pain         [facility] PT Assessment - 12/17/18 0001      AROM   Left Shoulder Flexion  120 Degrees    Left Shoulder ABduction  125 Degrees           Quick Dash - 12/17/18 0001    Open a tight or new jar  No difficulty    Do heavy household chores (wash walls, wash floors)  No difficulty    Carry a shopping bag or briefcase  No difficulty    Wash your back  Mild difficulty    Use a knife to cut food  No difficulty    Recreational activities in which you take some force or impact through your arm, shoulder, or hand (golf, hammering, tennis)  No difficulty    During the past week, to what extent has your arm, shoulder or hand problem interfered with your normal social activities with family, friends, neighbors, or groups?  Not at all    During the past week, to what extent has your arm, shoulder or hand problem limited your work or other regular daily activities  Slightly    Arm, shoulder, or hand  pain.  Mild    Tingling (pins and needles) in your arm, shoulder, or hand  Mild    Difficulty Sleeping  Mild difficulty    DASH Score  11.36 %             [facility] Adult PT Treatment/Exercise - 12/17/18 0001      Shoulder Exercises: Standing   Other Standing Exercises  Forearms on foam roller up wall x 7 reps with stretch at top      Shoulder Exercises: Pulleys   Flexion  2 minutes    ABduction  2 minutes      Shoulder Exercises: Therapy Ball   Flexion  Both;10 reps   2# on bilat wrist, forward lean into end stretch     Shoulder Exercises: Stretch   Wall Stretch - ABduction  5 reps    Wall Stretch - ABduction Limitations  Tactile cues to decrease Lt scapular compensation; then "snow angels" on wall x5 for 5 sec holds      Manual Therapy   Soft tissue mobilization  in right sidelying to left lateral trunk in area of tightness     Scapular Mobilization  in right sidelying to the left scapula into all directions,     Passive ROM  to L shoulder in  direction of flexion, abduction and ER to pt's tolerance avoiding increased discomfort in elbow    pt had discomfort in elbow during Er   Neural Stretch  nerve gliding to medial nerve briefly                  PT Long Term Goals - 12/17/18 0804      PT LONG TERM GOAL #1   Title  Patient will be independent with a shoulder ROM HEP to improve ROM    Baseline  11/01/2018 : Pt is doing some exercise at home ,but need to progress them, 12/17/18- pt is independent    Time  4    Period  Weeks    Status  Achieved      PT LONG TERM GOAL #2   Title  Patient will increase left shoulder active flexion ROM to >/= 140 degrees for increased ease reaching overhead and to return to baseline.    Baseline  86 taken post op,. 11/01/2018: 90 with shoulder hiking, 12/02/13/20- 120 degrees    Time  8    Period  Weeks    Status  On-going      PT LONG TERM GOAL #3   Title  Patient will increase left active shoulder abduction to >/= 140 degrees for increased ease reaching and to return to baseline.    Baseline  103 degrees post op,11/01/2018 pt shrugs shoulder with attempts had 90 degrees, 12/17/18- 125 with some shoulder hiking    Time  8    Period  Weeks    Status  On-going      PT LONG TERM GOAL #4   Title  Patient will improve her DASH score to be </= 10 for improved overall upper extremity function.    Baseline  61.36 post-op; 2.27 pre-op, 12/17/18- 11.36    Time  8    Period  Weeks    Status  On-going      PT LONG TERM GOAL #5   Title  Patient will verbalize good understanding of lymphedema risk reduction practices.    Baseline  12/17/18- pt is aware of these    Time  8    Period  Weeks  Status  Achieved            Plan - 12/17/18 0855    Clinical Impression Statement  Pt continues to have left pec tightness. Assessed pt's progress towards goals in therapy today. She is progressing towards goals but still has not met her left shoulder abduction and flexion ROM goals. Her  quick DASH score has improved greatly since beginning of therapy. Will decide at next session if pt would like to continue therapy. She is currently also having pain in left elbow most likely due to nerve tightness. Instructed pt in nerve stretching today.    PT Frequency  2x / week    PT Duration  8 weeks    PT Treatment/Interventions  ADLs/Self Care Home Management;Therapeutic exercise;Manual techniques;Patient/family education;Moist Heat;Passive range of motion;Scar mobilization    PT Next Visit Plan  decide on adding more appts, Cont left scapular mobs and scapular strengthening and control for ROM,Progress with closed chain activities for UE (cont forearm walking on wall/add to HEP)  Add core stabilty to allow better scapular stabilit    PT Home Exercise Plan  Post op shoulder ROM HEP, 3 way shoulder    Consulted and Agree with Plan of Care  Patient       Patient will benefit from skilled therapeutic intervention in order to improve the following deficits and impairments:  Postural dysfunction, Decreased range of motion, Decreased knowledge of precautions, Impaired UE functional use, Pain, Increased fascial restricitons, Decreased strength  Visit Diagnosis: Stiffness of left shoulder, not elsewhere classified  Abnormal posture  Muscle weakness (generalized)  Aftercare following surgery for neoplasm     Problem List Patient Active Problem List   Diagnosis Date Noted  . Anxiety and depression 12/10/2018  . Cancer of overlapping sites of left female breast ([place]) 10/01/2018  . Morbid obesity with BMI of 40.0-44.9, adult ([place]) 09/04/2018  . Malignant neoplasm of upper-inner quadrant of left breast in female, estrogen receptor positive ([place]) 09/02/2018  . Vitamin D deficiency 02/04/2018  . Urticaria 01/17/2018  . 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 Left hip pain 02/10/2017  . Anemia 01/21/2017  . 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 Prediabetes 12/13/2015  . Class 3 severe obesity with  serious comorbidity and body mass index (BMI) of 40.0 to 44.9 in adult ([place]) 12/13/2015  . Sensation of lump in throat 12/13/2015  . Leg edema 12/13/2015  . Dense breasts 12/13/2011  . 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 Hypertension 11/10/2011    [name] [facility] 12/17/2018, 8:59 AM  [place], [place], [address] Phone: [phone]   Fax:  [phone]  Name: Laranda Burkemper MRN: [id number] Date of Birth: 1965-05-22  Manus Gunning, PT 12/17/18 8:59 AM   PHYSICAL THERAPY DISCHARGE SUMMARY  Visits from Start of Care: 22  Current functional level related to goals / functional outcomes: Goals partially met   Remaining deficits: See above   Education / Equipment: HEP  Plan: Patient agrees to discharge.  Patient goals were partially met. Patient is being discharged due to not returning since the last visit.  ?????    [name] [place], [place] 06/12/19 4:28 PM

## 2018-12-20 ENCOUNTER — Encounter: Payer: BC Managed Care – PPO | Admitting: Physical Therapy

## 2018-12-21 ENCOUNTER — Other Ambulatory Visit: Payer: Self-pay | Admitting: Adult Health

## 2018-12-21 DIAGNOSIS — C50212 Malignant neoplasm of upper-inner quadrant of left female breast: Secondary | ICD-10-CM

## 2018-12-21 DIAGNOSIS — Z170 Estrogen receptor positive status [ER+]: Secondary | ICD-10-CM

## 2018-12-22 DIAGNOSIS — F3281 Premenstrual dysphoric disorder: Secondary | ICD-10-CM | POA: Insufficient documentation

## 2018-12-23 ENCOUNTER — Other Ambulatory Visit: Payer: Self-pay

## 2018-12-23 ENCOUNTER — Other Ambulatory Visit (INDEPENDENT_AMBULATORY_CARE_PROVIDER_SITE_OTHER): Payer: Self-pay | Admitting: Family Medicine

## 2018-12-23 ENCOUNTER — Encounter (INDEPENDENT_AMBULATORY_CARE_PROVIDER_SITE_OTHER): Payer: Self-pay | Admitting: Family Medicine

## 2018-12-23 ENCOUNTER — Ambulatory Visit (INDEPENDENT_AMBULATORY_CARE_PROVIDER_SITE_OTHER): Payer: BC Managed Care – PPO | Admitting: Family Medicine

## 2018-12-23 VITALS — BP 120/74 | HR 72 | Temp 98.7°F | Ht 66.0 in | Wt 261.0 lb

## 2018-12-23 DIAGNOSIS — E559 Vitamin D deficiency, unspecified: Secondary | ICD-10-CM

## 2018-12-23 DIAGNOSIS — G4733 Obstructive sleep apnea (adult) (pediatric): Secondary | ICD-10-CM | POA: Diagnosis not present

## 2018-12-23 DIAGNOSIS — Z9189 Other specified personal risk factors, not elsewhere classified: Secondary | ICD-10-CM

## 2018-12-23 DIAGNOSIS — R7303 Prediabetes: Secondary | ICD-10-CM

## 2018-12-23 DIAGNOSIS — E7849 Other hyperlipidemia: Secondary | ICD-10-CM

## 2018-12-23 DIAGNOSIS — Z6841 Body Mass Index (BMI) 40.0 and over, adult: Secondary | ICD-10-CM

## 2018-12-23 MED ORDER — METFORMIN HCL 500 MG PO TABS: 500.0000 mg | ORAL_TABLET | Freq: Every day | ORAL | 0 refills | Status: DC

## 2018-12-23 NOTE — Progress Notes (Signed)
Office: [phone]  /  Fax: [phone]   HPI:  Chief Complaint: OBESITY Roberta [name] is here to discuss her progress with her obesity treatment plan. She is on the follow the Category 2 plan and states she is following her eating plan approximately 75 % of the time. She states she is exercising by walking for 30 minutes 2 times per week.  Roberta [name] reports cravings at night, but making good choices. She has poor sleep due to hot flashes (Tamoxifen) but Melatonin helps. She is taking Zoloft as well. She wonders if it is safe to go back on [facility], which was helpful prior to breast cancer.  Today's visit was # 27 Starting weight: 292 lbs Starting date: 10/08/17 Today's weight : Weight: 261 lb (118.4 kg)  Today's date: 12/23/2018 Total lbs lost to date: 31 lbs Total lbs lost since last in-office visit: 1 lb  Prediabetes Roberta [name] has a diagnosis of prediabetes. Most recent HgA1c was 6. She is currently taking Metformin. She denies side effects and polyphagia.   At risk for diabetes Roberta [name] is at higher than average risk for developing diabetes due to her obesity.   Vitamin D Deficiency  Roberta [name] has a diagnosis of vit D deficiency. She is taking vit D daily.   History of Gastric Bypass  Roberta [name] has a history of gastric bypass. She is not taking a bariatric multivitamin.   Hypertension  Roberta [name] has a diagnosis of hypertension. Her blood pressure is at goal, she is taking Hyzaar. She denies chest pain, SOB, and edema.   BP Readings from Last 3 Encounters:  12/23/18 120/74  12/09/18 105/69  11/12/18 98/64   History of Breast Cancer  Roberta [name] has a history of breast cancer. She is currently taking Tamoxifen. She reports increased vasomotor symptoms.   Microcytic Anemia Roberta [name] has a diagnosis of mild microcytic anemia. She denies fatigue and SOB.   CBC Latest Ref Rng & Units 10/31/2018 10/01/2018 09/25/2018  WBC 4.0 - 10.5 K/uL 4.8 12.1(H) 4.7  Hemoglobin 12.0 - 15.0 g/dL 11.7(L) 12.2 13.0    Hematocrit 36.0 - 46.0 % 36.2 36.9 40.5  Platelets 150 - 400 K/uL 275 222 244   Lab Results  Component Value Date   IRON 162 (H) 01/22/2017   FERRITIN 14.5 01/22/2017   ASSESSMENT AND PLAN:  Prediabetes, A1c = 6 (10/01/18), Rx Metformin - Plan: Lipid Panel With LDL/HDL Ratio, HgB A1c, Insulin, random, CBC w/Diff/Platelet, Comprehensive Metabolic Panel (CMET)  Vitamin D deficiency - Plan: Vitamin D (25 hydroxy), CBC w/Diff/Platelet  OSA (obstructive sleep apnea)  Other hyperlipidemia  At risk for diabetes mellitus  Class 3 severe obesity with serious comorbidity and body mass index (BMI) of 40.0 to 44.9 in adult, unspecified obesity type (Roberta [name])  Prediabetes - Plan: metFORMIN (GLUCOPHAGE) 500 MG tablet  PLAN:  Pre-Diabetes Roberta [name] will continue to work on weight loss, exercise, and decreasing simple carbohydrates to help decrease the risk of diabetes. She agrees to continue Metformin 500 mg daily with no refill sent in today.   Diabetes risk counseling (~15 min) Roberta [name] is a 53 y.o. female and has risk factors for diabetes including obesity. We discussed intensive lifestyle modifications today with an emphasis on weight loss as well as increasing exercise and decreasing simple carbohydrates in her diet.  Vitamin D Deficiency Low vitamin D level contributes to fatigue and are associated with obesity, breast, and colon cancer. She agrees to continue to take prescription Vit D @50 ,000 IU every week and will follow up for routine  testing of vitamin D, at least 2-3 times per year to avoid over-replacement.  History of Gastric Bypass  We discussed her history of gastric bypass today. Recommended to start taking bariatric multivitamin Procare once daily.  Hypertension Roberta [name] is working on healthy weight loss and exercise to improve blood pressure control. She agrees to continue current treatment. We will watch for signs of hypotension as she continues her lifestyle modifications.  History  of Breast Cancer  We discussed for Roberta [name] to consider taking Effexor. Will continue to monitor.   Microcytic Anemia The diagnosis of microcytic anemia was discussed with Roberta [name]. She was given suggestions of iron rich foods and iron supplement was not prescribed. Will continue to monitor  Obesity Roberta [name] is currently in the action stage of change. As such, her goal is to continue with weight loss efforts. She has agreed to follow the Category 2 plan. Roberta [name] has been instructed to work up to a goal of 150 minutes of combined cardio and strengthening exercise per week for weight loss and overall health benefits. We discussed the following Behavioral Modification Strategies today: increasing lean protein intake, work on meal planning and easy cooking plans and emotional eating strategies.  Roberta [name] has agreed to follow up with our clinic in 2 weeks. She was informed of the importance of frequent follow up visits to maximize her success with intensive lifestyle modifications for her multiple health conditions.  ALLERGIES: Allergies  Allergen Reactions  . Lisinopril Swelling    Swelling of the tongue  . Penicillins     UNSPECIFIED CHILDHOOD REACTION  . Norvasc [Amlodipine Besylate] Rash  . Red Dye Rash  . Tetanus Toxoids Swelling    Swelling at site of injection    MEDICATIONS: Current Outpatient Medications on File Prior to Visit  Medication Sig Dispense Refill  . Biotin 10000 MCG TABS Take 10,000 mcg by mouth at bedtime.     . Cholecalciferol (VITAMIN D-3) 5000 units TABS Take 1 tablet by mouth daily. (Patient taking differently: Take 5,000 Units by mouth daily. ) 30 tablet   . ergocalciferol (VITAMIN D2) 1.25 MG (50000 UT) capsule ergocalciferol (vitamin D2) 1,250 mcg (50,000 unit) capsule    . losartan-hydrochlorothiazide (HYZAAR) 100-25 MG tablet Take 1 tablet by mouth daily. 90 tablet 1  . Multiple Vitamin (MULTIVITAMIN) tablet Take 1 tablet by mouth daily.    . Omega-3 Fatty Acids (OMEGA  3 500 PO) Take 500 mg by mouth daily.     . sertraline (ZOLOFT) 25 MG tablet Take 25 mg by mouth daily.    . vitamin E 100 UNIT capsule Take 180 Units by mouth at bedtime.     Marland Kitchen zinc gluconate 50 MG tablet Take 50 mg by mouth at bedtime.     . tamoxifen (NOLVADEX) 20 MG tablet TAKE 1 TABLET BY MOUTH EVERY DAY 30 tablet 3   No current facility-administered medications on file prior to visit.    PAST MEDICAL HISTORY: Past Medical History:  Diagnosis Date  . Back pain   . 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 Depression   . Hip pain   . Hypertension   . Obesity   . PONV (postoperative nausea and vomiting)   . Prediabetes   . Swelling    bilat LE  . Wears glasses     PAST SURGICAL HISTORY: Past Surgical History:  Procedure Laterality Date  . CHOLECYSTECTOMY    . COLONOSCOPY    . DILATION AND CURETTAGE OF UTERUS    .  GASTRIC BYPASS    . KNEE SURGERY  2002   Ligament repair  . MASTECTOMY W/ SENTINEL NODE BIOPSY Left 10/01/2018   Procedure: LEFT TOTAL MASTECTOMY WITH SENTINEL LYMPH NODE BIOPSY AND BLUE DYE INJECTION;  Surgeon: Fanny Skates, MD;  Location: [place];  Service: General;  Laterality: Left;    SOCIAL HISTORY: Social History   Tobacco Use  . Smoking status: Never Smoker  . Smokeless tobacco: Never Used  Substance Use Topics  . Alcohol use: Yes    Comment: occasional wine  . Drug use: No    FAMILY HISTORY: Family History  Problem Relation Age of Onset  . Diabetes Mother   . Hypertension Mother   . Heart disease Mother   . Stroke Mother   . Kidney disease Mother   . Obesity Mother   . Cancer Maternal Aunt        lung  . Cancer Maternal Uncle        lung  . Throat cancer Maternal Aunt     ROS: Review of Systems  Constitutional: Positive for weight loss. Negative for malaise/fatigue.  Respiratory: Negative for shortness of breath.   Cardiovascular: Negative for chest pain and leg swelling.    PHYSICAL EXAM: Blood pressure 120/74, pulse 72,  temperature 98.7 F (37.1 C), temperature source Oral, height 5\' 6"  (1.676 m), weight 261 lb (118.4 kg), SpO2 98 %. Body mass index is 42.13 kg/m.  General: Cooperative, alert, well developed, in no acute distress. HEENT: Conjunctivae and lids unremarkable. Neck: No thyromegaly.  Cardiovascular: Regular rhythm.  Lungs: Normal work of breathing. Extremities: No edema.  Neurologic: No focal deficits.   RECENT LABS AND TESTS: BMET    Component Value Date/Time   NA 137 10/31/2018 1220   NA 139 07/17/2018 1547   K 3.6 10/31/2018 1220   CL 103 10/31/2018 1220   CO2 26 10/31/2018 1220   GLUCOSE 88 10/31/2018 1220   BUN 15 10/31/2018 1220   BUN 16 07/17/2018 1547   CREATININE 0.84 10/31/2018 1220   CREATININE 0.95 09/04/2018 0840   CALCIUM 9.0 10/31/2018 1220   GFRNONAA >60 10/31/2018 1220   GFRNONAA >60 09/04/2018 0840   GFRAA >60 10/31/2018 1220   GFRAA >60 09/04/2018 0840   Lab Results  Component Value Date   HGBA1C 6.0 (H) 09/25/2018   HGBA1C 5.9 (H) 07/17/2018   HGBA1C 6.1 (H) 01/31/2018   HGBA1C 6.0 (H) 10/08/2017   HGBA1C 6.3 06/25/2017   Lab Results  Component Value Date   INSULIN 13.4 01/31/2018   INSULIN 10.6 10/08/2017   CBC    Component Value Date/Time   WBC 4.8 10/31/2018 1220   RBC 4.41 10/31/2018 1220   HGB 11.7 (L) 10/31/2018 1220   HGB 11.8 (L) 09/04/2018 0840   HGB 12.3 07/17/2018 1547   HCT 36.2 10/31/2018 1220   HCT 38.5 07/17/2018 1547   PLT 275 10/31/2018 1220   PLT 260 09/04/2018 0840   MCV 82.1 10/31/2018 1220   MCV 83 07/17/2018 1547   MCH 26.5 10/31/2018 1220   MCHC 32.3 10/31/2018 1220   RDW 14.7 10/31/2018 1220   RDW 14.9 07/17/2018 1547   LYMPHSABS 1.5 10/31/2018 1220   LYMPHSABS 1.4 07/17/2018 1547   MONOABS 0.6 10/31/2018 1220   EOSABS 0.1 10/31/2018 1220   EOSABS 0.1 07/17/2018 1547   BASOSABS 0.0 10/31/2018 1220   BASOSABS 0.0 07/17/2018 1547   Iron/TIBC/Ferritin/ %Sat    Component Value Date/Time   IRON 162 (H)  01/22/2017 1152  FERRITIN 14.5 01/22/2017 1152   Lipid Panel     Component Value Date/Time   CHOL 112 10/08/2017 1239   TRIG 69 10/08/2017 1239   HDL 50 10/08/2017 1239   CHOLHDL 2 06/25/2017 1541   VLDL 15.0 06/25/2017 1541   LDLCALC 48 10/08/2017 1239   Hepatic Function Panel     Component Value Date/Time   PROT 7.2 10/31/2018 1220   PROT 6.9 07/17/2018 1547   ALBUMIN 3.2 (L) 10/31/2018 1220   ALBUMIN 3.6 (L) 07/17/2018 1547   AST 17 10/31/2018 1220   AST 13 (L) 09/04/2018 0840   ALT 13 10/31/2018 1220   ALT 10 09/04/2018 0840   ALKPHOS 79 10/31/2018 1220   BILITOT 0.4 10/31/2018 1220   BILITOT 0.4 09/04/2018 0840      Component Value Date/Time   TSH 1.480 10/08/2017 1239   TSH 1.24 06/25/2017 1541   TSH 1.53 07/11/2016 1150   I, Renee Ramus, am acting as Location manager for Briscoe Deutscher, DO   I have reviewed the above documentation for accuracy and completeness, and I agree with the above. Briscoe Deutscher, DO

## 2018-12-25 ENCOUNTER — Encounter (INDEPENDENT_AMBULATORY_CARE_PROVIDER_SITE_OTHER): Payer: Self-pay | Admitting: Family Medicine

## 2018-12-25 ENCOUNTER — Encounter: Payer: Self-pay | Admitting: Internal Medicine

## 2018-12-25 ENCOUNTER — Other Ambulatory Visit (INDEPENDENT_AMBULATORY_CARE_PROVIDER_SITE_OTHER): Payer: Self-pay | Admitting: Family Medicine

## 2018-12-25 MED ORDER — SERTRALINE HCL 25 MG PO TABS: 25.0000 mg | ORAL_TABLET | Freq: Every day | ORAL | 1 refills | Status: DC

## 2018-12-26 ENCOUNTER — Encounter (INDEPENDENT_AMBULATORY_CARE_PROVIDER_SITE_OTHER): Payer: Self-pay | Admitting: Family Medicine

## 2019-01-08 ENCOUNTER — Other Ambulatory Visit: Payer: Self-pay

## 2019-01-08 ENCOUNTER — Ambulatory Visit (INDEPENDENT_AMBULATORY_CARE_PROVIDER_SITE_OTHER): Payer: BC Managed Care – PPO | Admitting: Family Medicine

## 2019-01-08 ENCOUNTER — Encounter (INDEPENDENT_AMBULATORY_CARE_PROVIDER_SITE_OTHER): Payer: Self-pay | Admitting: Family Medicine

## 2019-01-08 VITALS — BP 112/70 | HR 68 | Temp 97.9°F | Ht 66.0 in | Wt 265.0 lb

## 2019-01-08 DIAGNOSIS — E559 Vitamin D deficiency, unspecified: Secondary | ICD-10-CM

## 2019-01-08 DIAGNOSIS — Z6841 Body Mass Index (BMI) 40.0 and over, adult: Secondary | ICD-10-CM

## 2019-01-08 DIAGNOSIS — Z9189 Other specified personal risk factors, not elsewhere classified: Secondary | ICD-10-CM | POA: Diagnosis not present

## 2019-01-08 DIAGNOSIS — D509 Iron deficiency anemia, unspecified: Secondary | ICD-10-CM

## 2019-01-08 DIAGNOSIS — I10 Essential (primary) hypertension: Secondary | ICD-10-CM | POA: Diagnosis not present

## 2019-01-08 DIAGNOSIS — R7303 Prediabetes: Secondary | ICD-10-CM | POA: Diagnosis not present

## 2019-01-08 DIAGNOSIS — Z853 Personal history of malignant neoplasm of breast: Secondary | ICD-10-CM

## 2019-01-08 DIAGNOSIS — Z9884 Bariatric surgery status: Secondary | ICD-10-CM | POA: Diagnosis not present

## 2019-01-08 DIAGNOSIS — R21 Rash and other nonspecific skin eruption: Secondary | ICD-10-CM

## 2019-01-09 MED ORDER — MUPIROCIN 2 % EX OINT: 1.0000 "application " | TOPICAL_OINTMENT | Freq: Two times a day (BID) | CUTANEOUS | 0 refills | Status: DC | PRN

## 2019-01-09 MED ORDER — METFORMIN HCL 500 MG PO TABS: 500.0000 mg | ORAL_TABLET | Freq: Every day | ORAL | 0 refills | Status: DC

## 2019-01-10 ENCOUNTER — Encounter: Payer: Self-pay | Admitting: Internal Medicine

## 2019-01-10 DIAGNOSIS — L853 Xerosis cutis: Secondary | ICD-10-CM

## 2019-01-13 NOTE — Progress Notes (Signed)
Chief Complaint:   OBESITY Roberta [name] is here to discuss her progress with her obesity treatment plan along with follow-up of her obesity related diagnoses. Roberta [name] is on the Category 2 Plan and states she is following her eating plan approximately 75-80% of the time. Roberta [name] states she is walking 1/2 mile for 30 minutes 3 times per week.  Today's visit was #: 28 Starting weight: 292 lbs Starting date: 10/08/2017 Today's weight: 265 lbs Today's date: 01/08/2019 Total lbs lost to date: 27 lbs Total lbs lost since last in-office visit: 0  Interim History: Roberta [name] is up 7 pounds in water weight.  She states she had increased salt in her diet over the holidays.  Subjective:   1. Prediabetes Last A1c was 6.0 on 09/25/2018.  She is taking metformin.  2. Vitamin D deficiency She's Vitamin D level was 70.2 on 07/17/2018. She is currently taking vit D. She denies nausea, vomiting or muscle weakness.  3. History of gastric bypass Roberta [name] is currently taking a bariatric multivitamin.   4. Essential hypertension Roberta [name]'s blood pressure is currently at goal.  She is taking Hyzaar for blood pressure control.  5. History of breast cancer Taking tamoxifen.  Increased vasomotor symptoms.  6. Microcytic anemia She is not a vegetarian.  She has a history of weight loss surgery.   CBC Latest Ref Rng & Units 10/31/2018 10/01/2018 09/25/2018  WBC 4.0 - 10.5 K/uL 4.8 12.1(H) 4.7  Hemoglobin 12.0 - 15.0 g/dL 11.7(L) 12.2 13.0  Hematocrit 36.0 - 46.0 % 36.2 36.9 40.5  Platelets 150 - 400 K/uL 275 222 244   Lab Results  Component Value Date   IRON 162 (H) 01/22/2017   FERRITIN 14.5 01/22/2017   7. Rash This is a new problem for Roberta [name].  She has multiple circular, dark, irritated nodules on her arms, right leg, and right breast.  Assessment/Plan:   1. Prediabetes Roberta [name] will continue to work on weight loss, exercise, and decreasing simple carbohydrates to help decrease the risk of diabetes.   Orders - metFORMIN (GLUCOPHAGE) 500 MG tablet; Take 1 tablet (500 mg total) by mouth daily with breakfast.  Dispense: 30 tablet; Refill: 0  2. Vitamin D deficiency Low Vitamin D level contributes to fatigue and are associated with obesity, breast, and colon cancer. She agrees to continue to take prescription Vitamin D @50 ,000 IU every week and will follow-up for routine testing of vitamin D, at least 2-3 times per year to avoid over-replacement.  3. History of gastric bypass Will continue to monitor.  4. Essential hypertension Roberta [name] is working on healthy weight loss and exercise to improve blood pressure control. We will watch for signs of hypotension as she continues her lifestyle modifications.  5. History of breast cancer Tamoxifen may inhibit weight loss progress, so will monitor closely.   6. Microcytic anemia Orders and follow up as documented in patient record.  Counseling . Iron is essential for our bodies to make red blood cells.  Reasons that someone may be deficient include: an iron-deficient diet (more likely in those following vegan or vegetarian diets), women with heavy menses, patients with GI disorders or poor absorption, patients that have had bariatric surgery, frequent blood donors, patients with cancer, and patients with heart disease.   [place] foods include dark leafy greens, red and white meats, eggs, seafood, and beans.   . Certain foods and drinks prevent your body from absorbing iron properly. Avoid eating these foods in the same meal as  iron-rich foods or with iron supplements. These foods include: coffee, black tea, and red wine; milk, dairy products, and foods that are high in calcium; beans and soybeans; whole grains.  . Constipation can be a side effect of iron supplementation. Increased water and fiber intake are helpful. Water goal: > 2 liters/day. Fiber goal: > 25 grams/day.  7. Rash Will monitor.  Orders - mupirocin ointment (BACTROBAN) 2 %; Apply  1 application topically 2 (two) times daily as needed.  Dispense: 22 g; Refill: 0 - Ambulatory referral to Dermatology  8. At risk for heart disease Roberta [name] was given approximately 15 minutes of coronary artery disease prevention counseling today. She is 54 y.o. female and has risk factors for heart disease including obesity. We discussed intensive lifestyle modifications today with an emphasis on specific weight loss instructions and strategies.   9. Class 3 severe obesity with serious comorbidity and body mass index (BMI) of 40.0 to 44.9 in adult, unspecified obesity type ([facility]) Roberta [name] is currently in the action stage of change. As such, her goal is to continue with weight loss efforts. She has agreed to on the Category 2 Plan.   We discussed the following exercise goals today: For substantial health benefits, adults should do at least 150 minutes (2 hours and 30 minutes) a week of moderate-intensity, or 75 minutes (1 hour and 15 minutes) a week of vigorous-intensity aerobic physical activity, or an equivalent combination of moderate- and vigorous-intensity aerobic activity. Aerobic activity should be performed in episodes of at least 10 minutes, and preferably, it should be spread throughout the week. Adults should also include muscle-strengthening activities that involve all major muscle groups on 2 or more days a week.  We discussed the following behavioral modification strategies today: increasing lean protein intake, decreasing simple carbohydrates, increasing vegetables and increasing water intake.  Roberta [name] has agreed to follow-up with our clinic in 2 weeks. She was informed of the importance of frequent follow-up visits to maximize her success with intensive lifestyle modifications for her multiple health conditions.   Objective:   Blood pressure 112/70, pulse 68, temperature 97.9 F (36.6 C), temperature source Oral, height 5\' 6"  (1.676 m), weight 265 lb (120.2 kg), SpO2 97  %. Body mass index is 42.77 kg/m.  General: Cooperative, alert, well developed, in no acute distress. HEENT: Conjunctivae and lids unremarkable. Neck: No thyromegaly.  Cardiovascular: Regular rhythm.  Lungs: Normal work of breathing. Extremities: No edema.  Neurologic: No focal deficits.   Lab Results  Component Value Date   CREATININE 0.84 10/31/2018   BUN 15 10/31/2018   NA 137 10/31/2018   K 3.6 10/31/2018   CL 103 10/31/2018   CO2 26 10/31/2018   Lab Results  Component Value Date   ALT 13 10/31/2018   AST 17 10/31/2018   ALKPHOS 79 10/31/2018   BILITOT 0.4 10/31/2018   Lab Results  Component Value Date   HGBA1C 6.0 (H) 09/25/2018   HGBA1C 5.9 (H) 07/17/2018   HGBA1C 6.1 (H) 01/31/2018   HGBA1C 6.0 (H) 10/08/2017   HGBA1C 6.3 06/25/2017   Lab Results  Component Value Date   INSULIN 13.4 01/31/2018   INSULIN 10.6 10/08/2017   Lab Results  Component Value Date   TSH 1.480 10/08/2017   Lab Results  Component Value Date   CHOL 112 10/08/2017   HDL 50 10/08/2017   LDLCALC 48 10/08/2017   TRIG 69 10/08/2017   CHOLHDL 2 06/25/2017   Lab Results  Component Value Date  WBC 4.8 10/31/2018   HGB 11.7 (L) 10/31/2018   HCT 36.2 10/31/2018   MCV 82.1 10/31/2018   PLT 275 10/31/2018   Lab Results  Component Value Date   IRON 162 (H) 01/22/2017   FERRITIN 14.5 01/22/2017   Attestation Statements:   Reviewed by clinician on day of visit: allergies, medications, problem list, medical history, surgical history, family history, social history, and previous encounter notes.  I, Water quality scientist, CMA, am acting as Location manager for [employer], DO.  I have reviewed the above documentation for accuracy and completeness, and I agree with the above. Briscoe Deutscher, DO

## 2019-01-21 ENCOUNTER — Encounter: Payer: Self-pay | Admitting: Adult Health

## 2019-01-22 ENCOUNTER — Telehealth: Payer: Self-pay | Admitting: Adult Health

## 2019-01-22 ENCOUNTER — Ambulatory Visit (INDEPENDENT_AMBULATORY_CARE_PROVIDER_SITE_OTHER): Payer: BC Managed Care – PPO | Admitting: Family Medicine

## 2019-01-22 NOTE — Telephone Encounter (Signed)
Called pt per 1/18 sch message - no answer . Left message for patient to call back and r/s

## 2019-01-24 ENCOUNTER — Telehealth: Payer: Self-pay | Admitting: Adult Health

## 2019-01-24 NOTE — Telephone Encounter (Signed)
Returned patient's phone call regarding rescheduling 04/29 appointment time, per patient's request appointment time has been rescheduled.

## 2019-01-27 ENCOUNTER — Other Ambulatory Visit: Payer: Self-pay

## 2019-01-27 ENCOUNTER — Encounter (INDEPENDENT_AMBULATORY_CARE_PROVIDER_SITE_OTHER): Payer: Self-pay | Admitting: Bariatrics

## 2019-01-27 ENCOUNTER — Ambulatory Visit (INDEPENDENT_AMBULATORY_CARE_PROVIDER_SITE_OTHER): Payer: BC Managed Care – PPO | Admitting: Bariatrics

## 2019-01-27 VITALS — BP 108/72 | HR 70 | Temp 98.5°F | Ht 66.0 in | Wt 265.0 lb

## 2019-01-27 DIAGNOSIS — F3289 Other specified depressive episodes: Secondary | ICD-10-CM | POA: Diagnosis not present

## 2019-01-27 DIAGNOSIS — Z6841 Body Mass Index (BMI) 40.0 and over, adult: Secondary | ICD-10-CM

## 2019-01-27 DIAGNOSIS — I10 Essential (primary) hypertension: Secondary | ICD-10-CM

## 2019-01-27 DIAGNOSIS — R7303 Prediabetes: Secondary | ICD-10-CM

## 2019-01-27 NOTE — Progress Notes (Signed)
Chief Complaint:   OBESITY Roberta [name] is here to discuss her progress with her obesity treatment plan along with follow-up of her obesity related diagnoses. Roberta [name] is on the Category 2 Plan and states she is following her eating plan approximately 85-90% of the time. Roberta [name] states she is walking 30 minutes 3 times per week.  Today's visit was #: 7 Starting weight: 292 lbs Starting date: 10/08/2017 Today's weight: 265 lbs Today's date: 01/27/2019 Total lbs lost to date: 27  Total lbs lost since last in-office visit: 0  Interim History: Roberta [name]'s weight remains the same. She has struggled with snacking at night.  Subjective:   Prediabetes. Roberta [name] has a diagnosis of prediabetes based on her elevated HgA1c and was informed this puts her at greater risk of developing diabetes. She continues to work on diet and exercise to decrease her risk of diabetes. She denies polyphagia. She is taking metformin.  Lab Results  Component Value Date   HGBA1C 6.0 (H) 09/25/2018   Lab Results  Component Value Date   INSULIN 13.4 01/31/2018   INSULIN 10.6 10/08/2017   Essential hypertension. This is controlled. Roberta [name] is taking lisinopril/HCTZ.  BP Readings from Last 3 Encounters:  01/27/19 108/72  01/08/19 112/70  12/23/18 120/74   Lab Results  Component Value Date   CREATININE 0.84 10/31/2018   CREATININE 1.03 (H) 10/01/2018   CREATININE 0.92 09/25/2018   Other depression, with emotional eating. Roberta [name] is struggling with emotional eating and using food for comfort to the extent that it is negatively impacting her health. She has been working on behavior modification techniques to help reduce her emotional eating and has been somewhat successful. She shows no sign of suicidal or homicidal ideations. Roberta [name] is doing okay at this point.  Assessment/Plan:   Prediabetes. Roberta [name] will continue to work on weight loss, exercise, and decreasing simple carbohydrates to help decrease the risk of  diabetes. She will continue metformin and will have A1c level checked today.  Essential hypertension. Roberta [name] is working on healthy weight loss and exercise to improve blood pressure control. We will watch for signs of hypotension as she continues her lifestyle modifications. She will continue her medications as prescribed.  Other depression, with emotional eating. Roberta [name] is taking Zoloft and will continue. She will save some of her calories for the evening.  Class 3 severe obesity with serious comorbidity and body mass index (BMI) of 40.0 to 44.9 in adult, unspecified obesity type (Roberta [name]).  Roberta [name] is currently in the action stage of change. As such, her goal is to continue with weight loss efforts. She has agreed to the Category 2 Plan.   She will work on meal planning and intentional eating.  We reviewed independently previous labs including CMP, CBC, and glucose.  Exercise goals: For substantial health benefits, adults should do at least 150 minutes (2 hours and 30 minutes) a week of moderate-intensity, or 75 minutes (1 hour and 15 minutes) a week of vigorous-intensity aerobic physical activity, or an equivalent combination of moderate- and vigorous-intensity aerobic activity. Aerobic activity should be performed in episodes of at least 10 minutes, and preferably, it should be spread throughout the week. Adults should also include muscle-strengthening activities that involve all major muscle groups on 2 or more days a week.  Behavioral modification strategies: increasing lean protein intake, decreasing simple carbohydrates, increasing vegetables, increasing water intake, decreasing eating out, no skipping meals, meal planning and cooking strategies, keeping healthy foods in the home and planning for  success.  Roberta [name] has agreed to follow-up with our clinic in 2 weeks. She was informed of the importance of frequent follow-up visits to maximize her success with intensive lifestyle modifications for her  multiple health conditions.   Roberta [name] was informed we would discuss her lab results at her next visit unless there is a critical issue that needs to be addressed sooner. Roberta [name] agreed to keep her next visit at the agreed upon time to discuss these results.  Objective:   Blood pressure 108/72, pulse 70, temperature 98.5 F (36.9 C), temperature source Oral, height 5\' 6"  (1.676 m), weight 265 lb (120.2 kg), SpO2 98 %. Body mass index is 42.77 kg/m.  General: Cooperative, alert, well developed, in no acute distress. HEENT: Conjunctivae and lids unremarkable. Cardiovascular: Regular rhythm.  Lungs: Normal work of breathing. Neurologic: No focal deficits.   Lab Results  Component Value Date   CREATININE 0.84 10/31/2018   BUN 15 10/31/2018   NA 137 10/31/2018   K 3.6 10/31/2018   CL 103 10/31/2018   CO2 26 10/31/2018   Lab Results  Component Value Date   ALT 13 10/31/2018   AST 17 10/31/2018   ALKPHOS 79 10/31/2018   BILITOT 0.4 10/31/2018   Lab Results  Component Value Date   HGBA1C 6.0 (H) 09/25/2018   HGBA1C 5.9 (H) 07/17/2018   HGBA1C 6.1 (H) 01/31/2018   HGBA1C 6.0 (H) 10/08/2017   HGBA1C 6.3 06/25/2017   Lab Results  Component Value Date   INSULIN 13.4 01/31/2018   INSULIN 10.6 10/08/2017   Lab Results  Component Value Date   TSH 1.480 10/08/2017   Lab Results  Component Value Date   CHOL 112 10/08/2017   HDL 50 10/08/2017   LDLCALC 48 10/08/2017   TRIG 69 10/08/2017   CHOLHDL 2 06/25/2017   Lab Results  Component Value Date   WBC 4.8 10/31/2018   HGB 11.7 (L) 10/31/2018   HCT 36.2 10/31/2018   MCV 82.1 10/31/2018   PLT 275 10/31/2018   Lab Results  Component Value Date   IRON 162 (H) 01/22/2017   FERRITIN 14.5 01/22/2017   Attestation Statements:   Reviewed by clinician on day of visit: allergies, medications, problem list, medical history, surgical history, family history, social history, and previous encounter notes.  Time spent on visit  including pre-visit chart review and post-visit care was 20 minutes.   Migdalia Dk, am acting as Location manager for [employer], DO   I have reviewed the above documentation for accuracy and completeness, and I agree with the above. Jearld Lesch, DO

## 2019-01-28 LAB — HEMOGLOBIN A1C
Est. average glucose Bld gHb Est-mCnc: 137 mg/dL
Hgb A1c MFr Bld: 6.4 % — ABNORMAL HIGH (ref 4.8–5.6)

## 2019-01-28 LAB — VITAMIN D 25 HYDROXY (VIT D DEFICIENCY, FRACTURES): Vit D, 25-Hydroxy: 47.7 ng/mL (ref 30.0–100.0)

## 2019-01-29 NOTE — Progress Notes (Signed)
[facility]  [phone] [phone]    ID: Roberta [name] DOB: 23-May-1965  MR#: [id number]  YWV#:[id number]  Patient Care Team: [name], MD as PCP - General (Internal Medicine) Himmelrich, Bryson Ha, RD (Inactive) as Dietitian ([place]) Mauro Kaufmann, RN as Oncology Nurse Navigator Rockwell Germany, RN as Oncology Nurse Navigator Fanny Skates, MD as Consulting Physician (General Surgery) Nikash Mortensen, Virgie Dad, MD as Consulting Physician (Oncology) Kyung Rudd, MD as Consulting Physician (Radiation Oncology) Juanita Craver, MD as Consulting Physician (Gastroenterology) Sherlyn Hay, DO as Consulting Physician (Obstetrics and Gynecology) OTHER MD:   CHIEF COMPLAINT: 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   She was started on tamoxifen in 11/2018.  She is having significant hot flashes, particularly at night.  She is sleeping poorly as a result.  She is not having vaginal wetness but she is having a vaginal odor and she wonders if that could be related to the tamoxifen.   REVIEW OF SYSTEMS: Roberta [name] continues to teach Spanish, now virtually.  She has some stiffness in the chest and arm on the side of her recent surgery.  She does not have pain sensitivity or shooting pains.  She was started on low-dose sertraline at the diet center and she also tried black cohosh to see if that would help with the hot flashes but it really made no difference.  She is taking appropriate pandemic precautions.  A detailed review of systems today was otherwise stable.   HISTORY OF CURRENT ILLNESS: From the original intake note:  Roberta [name] had routine screening mammography on 07/25/2018 which was normal.  Less than a month later she was found by Dr. Terri Piedra to have palpable lumps in the superior left  breast at a clinic exam.  Roberta [name] underwent unilateral left diagnostic mammography with tomography and left breast ultrasonography at [facility] on 08/23/2018 showing: Breast Density Category C. No suspicious masses are seen on the tomosynthesis images of the left breast. There are 2 groups of indeterminate calcifications however, one in the superior anterior superficial left breast which spans 8 mm. In the upper inner left breast, there is a 2.2 cm area of loosely grouped amorphous calcifications. Normal fibroglandular tissue in the upper inner and upper outer quadrants of the left breast.  There were no sonographic abnormalities to correspond with the masses identified on the clinical breast exam.  Accordingly on 08/29/2018 she proceeded to stereotactic biopsy of the left breast area in question. The pathology from this procedure showed ([id number]): invasive mammary carcinoma, nottingham grade 2 of 3. Prognostic indicators significant for: estrogen receptor, 100% positive and progesterone receptor, 2% positive, both with strong staining intensity. Proliferation marker Ki67 at 10%. HER2 negative (0+) by immunohistochemistry.  An additional biopsy of the left breast was performed on the same day. The pathology from this procedure showed ([id number]): ductal carcinoma in situ. Prognostic indicators significant for: estrogen receptor, 95% positie and progesterone receptor, 100% positive, both with strong staining intensity.  Finally, she underwent a left axillary ultrasound on 09/02/2018 showing: No left axillary adenopathy.  The patient's subsequent history is as detailed below.   PAST MEDICAL HISTORY: Past Medical History:  Diagnosis Date  . Back pain   . Cancer [facility])    multifocal left breast  . Depression   . Hip pain   . Hypertension   .  Obesity   . PONV (postoperative nausea and vomiting)   . Prediabetes   . Swelling    bilat LE  . Wears glasses     PAST SURGICAL HISTORY: Past  Surgical History:  Procedure Laterality Date  . CHOLECYSTECTOMY    . COLONOSCOPY    . DILATION AND CURETTAGE OF UTERUS    . GASTRIC BYPASS    . KNEE SURGERY  2002   Ligament repair  . MASTECTOMY W/ SENTINEL NODE BIOPSY Left 10/01/2018   Procedure: LEFT TOTAL MASTECTOMY WITH SENTINEL LYMPH NODE BIOPSY AND BLUE DYE INJECTION;  Surgeon: Fanny Skates, MD;  Location: [facility];  Service: General;  Laterality: Left;    FAMILY HISTORY: Family History  Problem Relation Age of Onset  . Diabetes Mother   . Hypertension Mother   . Heart disease Mother   . Stroke Mother   . Kidney disease Mother   . Obesity Mother   . Cancer Maternal Aunt        lung  . Cancer Maternal Uncle        lung  . Throat cancer Maternal Aunt    Roberta [name]'s father died in his mid 34s from alcoholism.  The patient's mother died at age 28 from end-stage renal disease.  The patient had 2 brothers, 3 sisters, one brother dying from complications of diabetes.  There is no cancer in the immediate family.  There are 2 maternal uncles and 2 maternal aunts who died from lung cancer, all of whom smoked.  A maternal aunt also had a history of throat cancer.   GYNECOLOGIC HISTORY:  No LMP recorded. (Menstrual status: Perimenopausal). 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  She is divorced, lives alone.   ADVANCED DIRECTIVES: She intends to name her sister [name] as healthcare power of attorney.  [name] can be reached at [phone].   HEALTH MAINTENANCE: Social History   Tobacco Use  . Smoking status: Never Smoker  . Smokeless tobacco: Never Used  Substance Use Topics  . Alcohol use: Yes    Comment: occasional wine  . Drug use: No     Colonoscopy: August 2020  PAP: Up-to-date  Bone density: 2005   Allergies  Allergen Reactions  . Lisinopril Swelling    Swelling of the tongue  . Penicillins     UNSPECIFIED CHILDHOOD REACTION  . Norvasc [Amlodipine Besylate] Rash  . Red Dye Rash  . Tetanus Toxoids Swelling    Swelling at site of injection    Current Outpatient Medications  Medication Sig Dispense Refill  . Biotin 10000 MCG TABS Take 10,000 mcg by mouth at bedtime.     . Cholecalciferol (VITAMIN D-3) 5000 units TABS Take 1 tablet by mouth daily. (Patient taking differently: Take 5,000 Units by mouth daily. ) 30 tablet   . losartan-hydrochlorothiazide (HYZAAR) 100-25 MG tablet Take 1 tablet by mouth daily. 90 tablet 1  . metFORMIN (GLUCOPHAGE) 500 MG tablet Take 1 tablet (500 mg total) by mouth daily with breakfast. 30 tablet 0  . Multiple Vitamin (MULTIVITAMIN) tablet Take 1 tablet by mouth daily.    . mupirocin ointment (BACTROBAN) 2 % Apply 1 application topically 2 (two) times daily as needed. 22 g 0  . Omega-3 Fatty Acids (  OMEGA 3 500 PO) Take 500 mg by mouth daily.     . sertraline (ZOLOFT) 25 MG tablet Take 1 tablet (25 mg total) by mouth daily. 90 tablet 1  . tamoxifen (NOLVADEX) 20 MG tablet TAKE 1 TABLET BY MOUTH EVERY DAY 30 tablet 3  . vitamin E 100 UNIT capsule Take 180 Units by mouth at bedtime.     Marland Kitchen zinc gluconate 50 MG tablet Take 50 mg by mouth at bedtime.      No current facility-administered medications for this visit.     OBJECTIVE: Morbidly obese African-American woman who appears stated age  16:   01/30/19 1436  BP: 110/70  Pulse: 81  Resp: 17  Temp: 98.5 F (36.9 C)  SpO2: 100%   Wt Readings from Last 3 Encounters:  01/30/19 272 lb 12.8 oz (123.7 kg)  01/27/19 265 lb (120.2 kg)  01/08/19 265 lb (120.2 kg)   Body mass index is 44.03 kg/m.    ECOG FS:1 - Symptomatic but completely ambulatory  Sclerae unicteric, EOMs intact Wearing a mask No cervical or  supraclavicular adenopathy Lungs no rales or rhonchi Heart regular rate and rhythm Abd soft, nontender, positive bowel sounds MSK no focal spinal tenderness, no upper extremity lymphedema Neuro: nonfocal, well oriented, appropriate affect Breasts: The right breast is unremarkable.  The left breast status post mastectomy.  There is no evidence of chest wall recurrence.  Both axillae are benign.  LAB RESULTS:  CMP     Component Value Date/Time   NA 137 10/31/2018 1220   NA 139 07/17/2018 1547   K 3.6 10/31/2018 1220   CL 103 10/31/2018 1220   CO2 26 10/31/2018 1220   GLUCOSE 88 10/31/2018 1220   BUN 15 10/31/2018 1220   BUN 16 07/17/2018 1547   CREATININE 0.84 10/31/2018 1220   CREATININE 0.95 09/04/2018 0840   CALCIUM 9.0 10/31/2018 1220   PROT 7.2 10/31/2018 1220   PROT 6.9 07/17/2018 1547   ALBUMIN 3.2 (L) 10/31/2018 1220   ALBUMIN 3.6 (L) 07/17/2018 1547   AST 17 10/31/2018 1220   AST 13 (L) 09/04/2018 0840   ALT 13 10/31/2018 1220   ALT 10 09/04/2018 0840   ALKPHOS 79 10/31/2018 1220   BILITOT 0.4 10/31/2018 1220   BILITOT 0.4 09/04/2018 0840   GFRNONAA >60 10/31/2018 1220   GFRNONAA >60 09/04/2018 0840   GFRAA >60 10/31/2018 1220   GFRAA >60 09/04/2018 0840    No results found for: TOTALPROTELP, ALBUMINELP, A1GS, A2GS, BETS, BETA2SER, GAMS, MSPIKE, SPEI  No results found for: KPAFRELGTCHN, LAMBDASER, KAPLAMBRATIO  Lab Results  Component Value Date   WBC 5.3 01/30/2019   NEUTROABS 3.0 01/30/2019   HGB 11.4 (L) 01/30/2019   HCT 35.8 (L) 01/30/2019   MCV 81.0 01/30/2019   PLT 241 01/30/2019    No results found for: LABCA2  No components found for: URKYHC623  No results for input(s): INR in the last 168 hours.  No results found for: LABCA2  No results found for: JSE831  No results found for: DVV616  No results found for: WVP710  No results found for: CA2729  No components found for: HGQUANT  No results found for: CEA1 / No results found for:  CEA1   No results found for: AFPTUMOR  No results found for: CHROMOGRNA  No results found for: HGBA, HGBA2QUANT, HGBFQUANT, HGBSQUAN (Hemoglobinopathy evaluation)   No results found for: LDH  Lab Results  Component Value Date   IRON 162 (H) 01/22/2017   (  Iron and TIBC)  Lab Results  Component Value Date   FERRITIN 14.5 01/22/2017    Urinalysis    Component Value Date/Time   COLORURINE YELLOW 06/25/2007 0640   APPEARANCEUR CLOUDY (A) 06/25/2007 0640   LABSPEC 1.016 06/25/2007 0640   PHURINE 6.0 06/25/2007 0640   GLUCOSEU NEGATIVE 06/25/2007 0640   HGBUR LARGE (A) 06/25/2007 0640   BILIRUBINUR NEGATIVE 06/25/2007 0640   KETONESUR NEGATIVE 06/25/2007 0640   PROTEINUR 100 (A) 06/25/2007 0640   UROBILINOGEN 0.2 06/25/2007 0640   NITRITE POSITIVE (A) 06/25/2007 0640   LEUKOCYTESUR MODERATE (A) 06/25/2007 0640     STUDIES:  No results found.   ELIGIBLE FOR AVAILABLE RESEARCH PROTOCOL: no   ASSESSMENT: 54 y.o. [place], [place] woman status post left breast upper inner quadrant biopsy for a clinical T2 N0, stage IB invasive ductal carcinoma, grade 2, estrogen and progesterone receptor positive, HER-2 not amplified, with an MIB-1-1 of 2%  (a) a second biopsy same day different quadrant showed ductal carcinoma in situ  (1) left breast s/p mastectomy on 10/01/2018 for a  pT1b pN0, stage IA invasive ductal carcinoma, grade 2, margins negative.  (a) a total of 5 lymph nodes biopsied were negative.    (2) Oncotype score of 20 predicts a risk of recurrence outside the breast within the next 9 years of 6% if her only systemic therapy is antiestrogens for 5 years.  Also predicts no benefit from chemotherapy.  (3) adjuvant radiation not indicated  (4)  tamoxifen started November 2020   PLAN: Janifer is tolerating tamoxifen generally well except for problems with hot flashes.  I think the we can make these better.  We are going to start with gabapentin at bedtime.  She has a good  understanding of the possible toxicities side effects and complications of this drug.  She will take 300 mg I think it will help her fall asleep better and sleep longer because of fewer hot flashes.  I do not anticipate this helping during the day.  She has been started on sertraline which really does not help as far as tamoxifen is concerned.  I am setting her up for a visit virtually in 6 weeks and at that point we will probably taper her off the sertraline while we add venlafaxine.  We will start at 33m.  I think that will significantly help the hot flashes.  She discovered that black cohosh does not work and indeed in the only randomized study I know of that drug it was no different from placebo.  She is continuing to work on her weight which is favorable.  She knows to call for any other issue that may develop before the next visit.  Total encounter time 25 minutes.*Sarajane JewsC. Adriena Manfre, MD  01/30/19 2:41 PM Medical Oncology and Hematology [facility] [place] [id number]. [phone]   Fax. [phone]   I, KWilburn Mylar am acting as scribe for Dr. GVirgie Dad Camil Hausmann.  I, GLurline DelMD, have reviewed the above documentation for accuracy and completeness, and I agree with the above.    *Total Encounter Time as defined by the Centers for Medicare and Medicaid Services includes, in addition to the face-to-face time of a patient visit (documented in the note above) non-face-to-face time: obtaining and reviewing outside history, ordering and reviewing medications, tests or procedures, care coordination (communications with other health care professionals or caregivers) and documentation in the medical record.

## 2019-01-30 ENCOUNTER — Inpatient Hospital Stay (HOSPITAL_BASED_OUTPATIENT_CLINIC_OR_DEPARTMENT_OTHER): Payer: BC Managed Care – PPO | Admitting: Oncology

## 2019-01-30 ENCOUNTER — Other Ambulatory Visit: Payer: Self-pay

## 2019-01-30 ENCOUNTER — Inpatient Hospital Stay: Payer: BC Managed Care – PPO | Attending: Adult Health

## 2019-01-30 VITALS — BP 110/70 | HR 81 | Temp 98.5°F | Resp 17 | Ht 66.0 in | Wt 272.8 lb

## 2019-01-30 DIAGNOSIS — Z9884 Bariatric surgery status: Secondary | ICD-10-CM

## 2019-01-30 DIAGNOSIS — Z170 Estrogen receptor positive status [ER+]: Secondary | ICD-10-CM | POA: Diagnosis not present

## 2019-01-30 DIAGNOSIS — Z8349 Family history of other endocrine, nutritional and metabolic diseases: Secondary | ICD-10-CM | POA: Diagnosis not present

## 2019-01-30 DIAGNOSIS — Z6841 Body Mass Index (BMI) 40.0 and over, adult: Secondary | ICD-10-CM

## 2019-01-30 DIAGNOSIS — Z9012 Acquired absence of left breast and nipple: Secondary | ICD-10-CM | POA: Diagnosis not present

## 2019-01-30 DIAGNOSIS — F329 Major depressive disorder, single episode, unspecified: Secondary | ICD-10-CM | POA: Diagnosis not present

## 2019-01-30 DIAGNOSIS — Z801 Family history of malignant neoplasm of trachea, bronchus and lung: Secondary | ICD-10-CM | POA: Insufficient documentation

## 2019-01-30 DIAGNOSIS — Z7984 Long term (current) use of oral hypoglycemic drugs: Secondary | ICD-10-CM | POA: Diagnosis not present

## 2019-01-30 DIAGNOSIS — R922 Inconclusive mammogram: Secondary | ICD-10-CM

## 2019-01-30 DIAGNOSIS — C50212 Malignant neoplasm of upper-inner quadrant of left female breast: Secondary | ICD-10-CM | POA: Insufficient documentation

## 2019-01-30 DIAGNOSIS — R635 Abnormal weight gain: Secondary | ICD-10-CM

## 2019-01-30 DIAGNOSIS — F32A Depression, unspecified: Secondary | ICD-10-CM

## 2019-01-30 DIAGNOSIS — I10 Essential (primary) hypertension: Secondary | ICD-10-CM | POA: Insufficient documentation

## 2019-01-30 DIAGNOSIS — Z833 Family history of diabetes mellitus: Secondary | ICD-10-CM | POA: Diagnosis not present

## 2019-01-30 DIAGNOSIS — Z79810 Long term (current) use of selective estrogen receptor modulators (SERMs): Secondary | ICD-10-CM | POA: Diagnosis not present

## 2019-01-30 DIAGNOSIS — F419 Anxiety disorder, unspecified: Secondary | ICD-10-CM

## 2019-01-30 DIAGNOSIS — C50812 Malignant neoplasm of overlapping sites of left female breast: Secondary | ICD-10-CM | POA: Diagnosis not present

## 2019-01-30 DIAGNOSIS — Z79899 Other long term (current) drug therapy: Secondary | ICD-10-CM | POA: Diagnosis not present

## 2019-01-30 DIAGNOSIS — D509 Iron deficiency anemia, unspecified: Secondary | ICD-10-CM

## 2019-01-30 DIAGNOSIS — E559 Vitamin D deficiency, unspecified: Secondary | ICD-10-CM | POA: Diagnosis not present

## 2019-01-30 DIAGNOSIS — K9089 Other intestinal malabsorption: Secondary | ICD-10-CM

## 2019-01-30 DIAGNOSIS — R7303 Prediabetes: Secondary | ICD-10-CM

## 2019-01-30 DIAGNOSIS — Z8249 Family history of ischemic heart disease and other diseases of the circulatory system: Secondary | ICD-10-CM | POA: Diagnosis not present

## 2019-01-30 LAB — COMPREHENSIVE METABOLIC PANEL
ALT: 15 U/L (ref 0–44)
AST: 18 U/L (ref 15–41)
Albumin: 3 g/dL — ABNORMAL LOW (ref 3.5–5.0)
Alkaline Phosphatase: 67 U/L (ref 38–126)
Anion gap: 8 (ref 5–15)
BUN: 20 mg/dL (ref 6–20)
CO2: 29 mmol/L (ref 22–32)
Calcium: 8.7 mg/dL — ABNORMAL LOW (ref 8.9–10.3)
Chloride: 104 mmol/L (ref 98–111)
Creatinine, Ser: 0.92 mg/dL (ref 0.44–1.00)
GFR calc Af Amer: >60 mL/min (ref >60)
GFR calc non Af Amer: >60 mL/min (ref >60)
Glucose, Bld: 140 mg/dL — ABNORMAL HIGH (ref 70–99)
Potassium: 3.5 mmol/L (ref 3.5–5.1)
Sodium: 141 mmol/L (ref 135–145)
Total Bilirubin: 0.3 mg/dL (ref 0.3–1.2)
Total Protein: 7.1 g/dL (ref 6.5–8.1)

## 2019-01-30 LAB — CBC WITH DIFFERENTIAL/PLATELET
Abs Immature Granulocytes: 0.01 10*3/uL (ref 0.00–0.07)
Basophils Absolute: 0 10*3/uL (ref 0.0–0.1)
Basophils Relative: 1 %
Eosinophils Absolute: 0 10*3/uL (ref 0.0–0.5)
Eosinophils Relative: 0 %
HCT: 35.8 % — ABNORMAL LOW (ref 36.0–46.0)
Hemoglobin: 11.4 g/dL — ABNORMAL LOW (ref 12.0–15.0)
Immature Granulocytes: 0 %
Lymphocytes Relative: 32 %
Lymphs Abs: 1.7 10*3/uL (ref 0.7–4.0)
MCH: 25.8 pg — ABNORMAL LOW (ref 26.0–34.0)
MCHC: 31.8 g/dL (ref 30.0–36.0)
MCV: 81 fL (ref 80.0–100.0)
Monocytes Absolute: 0.5 10*3/uL (ref 0.1–1.0)
Monocytes Relative: 10 %
Neutro Abs: 3 10*3/uL (ref 1.7–7.7)
Neutrophils Relative %: 57 %
Platelets: 241 10*3/uL (ref 150–400)
RBC: 4.42 MIL/uL (ref 3.87–5.11)
RDW: 15.5 % (ref 11.5–15.5)
WBC: 5.3 10*3/uL (ref 4.0–10.5)
nRBC: 0 % (ref 0.0–0.2)

## 2019-01-30 MED ORDER — GABAPENTIN 300 MG PO CAPS: 300.0000 mg | ORAL_CAPSULE | Freq: Every day | ORAL | 4 refills | Status: DC

## 2019-01-30 MED ORDER — TAMOXIFEN CITRATE 20 MG PO TABS: 20.0000 mg | ORAL_TABLET | Freq: Every day | ORAL | 4 refills | Status: DC

## 2019-01-31 ENCOUNTER — Telehealth: Payer: Self-pay | Admitting: Oncology

## 2019-01-31 NOTE — Telephone Encounter (Signed)
I talk with patient regarding video visit °

## 2019-02-10 ENCOUNTER — Other Ambulatory Visit: Payer: Self-pay

## 2019-02-10 ENCOUNTER — Encounter (INDEPENDENT_AMBULATORY_CARE_PROVIDER_SITE_OTHER): Payer: Self-pay | Admitting: Family Medicine

## 2019-02-10 ENCOUNTER — Ambulatory Visit (INDEPENDENT_AMBULATORY_CARE_PROVIDER_SITE_OTHER): Payer: BC Managed Care – PPO | Admitting: Family Medicine

## 2019-02-10 VITALS — BP 119/77 | HR 80 | Temp 98.1°F | Ht 66.0 in | Wt 266.0 lb

## 2019-02-10 DIAGNOSIS — R7303 Prediabetes: Secondary | ICD-10-CM

## 2019-02-10 DIAGNOSIS — Z9189 Other specified personal risk factors, not elsewhere classified: Secondary | ICD-10-CM

## 2019-02-10 DIAGNOSIS — F418 Other specified anxiety disorders: Secondary | ICD-10-CM

## 2019-02-10 DIAGNOSIS — Z6841 Body Mass Index (BMI) 40.0 and over, adult: Secondary | ICD-10-CM

## 2019-02-10 MED ORDER — SERTRALINE HCL 50 MG PO TABS: 50.0000 mg | ORAL_TABLET | Freq: Every day | ORAL | 0 refills | Status: DC

## 2019-02-10 NOTE — Progress Notes (Signed)
Chief Complaint:   OBESITY Roberta [name] is here to discuss her progress with her obesity treatment plan along with follow-up of her obesity related diagnoses. Roberta [name] is on the Category 2 Plan and states she is following her eating plan approximately 80% of the time. Roberta [name] states she is exercising 0 minutes 0 times per week.  Today's visit was #: 30 Starting weight: 292 lbs Starting date: 10/08/2017 Today's weight: 266 lbs Today's date: 02/10/2019 Total lbs lost to date: 26  Total lbs lost since last in-office visit: 0  Interim History: Roberta [name] is struggling with insomnia. She has been sticking to the meal plan more. Nighttime eating has been happening more frequently than she would like. She reports being under significant stress at work.  Subjective:   Prediabetes. Roberta [name] has a diagnosis of prediabetes based on her elevated HgA1c and was informed this puts her at greater risk of developing diabetes. She continues to work on diet and exercise to decrease her risk of diabetes. She denies nausea or hypoglycemia. A1c 6.4; previously 6.0. She is on metformin 500 mg daily.  Lab Results  Component Value Date   HGBA1C 6.4 (H) 01/27/2019   Lab Results  Component Value Date   INSULIN 13.4 01/31/2018   INSULIN 10.6 10/08/2017   Depression with anxiety. Roberta [name] is struggling with emotional eating and using food for comfort to the extent that it is negatively impacting her health. She has been working on behavior modification techniques to help reduce her emotional eating and has been somewhat successful. She shows no sign of suicidal or homicidal ideations. Roberta [name] is on Zoloft 25 mg daily.  At risk for diabetes mellitus. Roberta [name] is at higher than average risk for developing diabetes due to her obesity.   Assessment/Plan:   Prediabetes. Roberta [name] will continue to work on weight loss, exercise, and decreasing simple carbohydrates to help decrease the risk of diabetes. She will have labs repeated  in 3 months. Will discuss changing medications if necessary or increasing metformin if weight does not decrease. She was given a refill on metformin 500 mg PO daily #30 with 0 refills.   Depression with anxiety. Behavior modification techniques were discussed today to help Roberta [name] deal with her emotional/non-hunger eating behaviors.  Orders and follow up as documented in patient record. She was given a prescription for an increased dose of sertraline (ZOLOFT) 50 MG tablet #30 with 0 refills.  At risk for diabetes mellitus. Roberta [name] was given approximately 15 minutes of diabetes education and counseling today. We discussed intensive lifestyle modifications today with an emphasis on weight loss as well as increasing exercise and decreasing simple carbohydrates in her diet. We also reviewed medication options with an emphasis on risk versus benefit of those discussed.   Repetitive spaced learning was employed today to elicit superior memory formation and behavioral change.  Class 3 severe obesity with serious comorbidity and body mass index (BMI) of 40.0 to 44.9 in adult, unspecified obesity type (Roberta [name]).  Roberta [name] is currently in the action stage of change. As such, her goal is to continue with weight loss efforts. She has agreed to the Category 2 Plan.   She will have repeat IC at her next appointment.  Exercise goals: For substantial health benefits, adults should do at least 150 minutes (2 hours and 30 minutes) a week of moderate-intensity, or 75 minutes (1 hour and 15 minutes) a week of vigorous-intensity aerobic physical activity, or an equivalent combination of moderate- and vigorous-intensity aerobic activity. Aerobic  activity should be performed in episodes of at least 10 minutes, and preferably, it should be spread throughout the week.  Behavioral modification strategies: increasing lean protein intake, increasing vegetables, meal planning and cooking strategies, keeping healthy foods in the home and  planning for success.  Roberta [name] has agreed to follow-up with our clinic in 2 weeks. She was informed of the importance of frequent follow-up visits to maximize her success with intensive lifestyle modifications for her multiple health conditions.   Objective:   Blood pressure 119/77, pulse 80, temperature 98.1 F (36.7 C), temperature source Oral, height 5\' 6"  (1.676 m), weight 266 lb (120.7 kg), SpO2 99 %. Body mass index is 42.93 kg/m.  General: Cooperative, alert, well developed, in no acute distress. HEENT: Conjunctivae and lids unremarkable. Cardiovascular: Regular rhythm.  Lungs: Normal work of breathing. Neurologic: No focal deficits.   Lab Results  Component Value Date   CREATININE 0.92 01/30/2019   BUN 20 01/30/2019   NA 141 01/30/2019   K 3.5 01/30/2019   CL 104 01/30/2019   CO2 29 01/30/2019   Lab Results  Component Value Date   ALT 15 01/30/2019   AST 18 01/30/2019   ALKPHOS 67 01/30/2019   BILITOT 0.3 01/30/2019   Lab Results  Component Value Date   HGBA1C 6.4 (H) 01/27/2019   HGBA1C 6.0 (H) 09/25/2018   HGBA1C 5.9 (H) 07/17/2018   HGBA1C 6.1 (H) 01/31/2018   HGBA1C 6.0 (H) 10/08/2017   Lab Results  Component Value Date   INSULIN 13.4 01/31/2018   INSULIN 10.6 10/08/2017   Lab Results  Component Value Date   TSH 1.480 10/08/2017   Lab Results  Component Value Date   CHOL 112 10/08/2017   HDL 50 10/08/2017   LDLCALC 48 10/08/2017   TRIG 69 10/08/2017   CHOLHDL 2 06/25/2017   Lab Results  Component Value Date   WBC 5.3 01/30/2019   HGB 11.4 (L) 01/30/2019   HCT 35.8 (L) 01/30/2019   MCV 81.0 01/30/2019   PLT 241 01/30/2019   Lab Results  Component Value Date   IRON 162 (H) 01/22/2017   FERRITIN 14.5 01/22/2017    Attestation Statements:   Reviewed by clinician on day of visit: allergies, medications, problem list, medical history, surgical history, family history, social history, and previous encounter notes.  I, Michaelene Song, am  acting as transcriptionist for Ilene Qua, MD  I have reviewed the above documentation for accuracy and completeness, and I agree with the above. - Ilene Qua, MD

## 2019-02-13 ENCOUNTER — Encounter (INDEPENDENT_AMBULATORY_CARE_PROVIDER_SITE_OTHER): Payer: Self-pay | Admitting: Family Medicine

## 2019-02-13 ENCOUNTER — Other Ambulatory Visit (INDEPENDENT_AMBULATORY_CARE_PROVIDER_SITE_OTHER): Payer: Self-pay

## 2019-02-13 DIAGNOSIS — R7303 Prediabetes: Secondary | ICD-10-CM

## 2019-02-13 MED ORDER — METFORMIN HCL 500 MG PO TABS: 500.0000 mg | ORAL_TABLET | Freq: Every day | ORAL | 0 refills | Status: DC

## 2019-02-17 ENCOUNTER — Encounter: Payer: Self-pay | Admitting: Oncology

## 2019-02-19 ENCOUNTER — Telehealth: Payer: Self-pay | Admitting: *Deleted

## 2019-02-19 NOTE — Telephone Encounter (Signed)
This RN called pt post her reply to my chart message regarding " sparks in her leg " with this RN requesting further information but pt replying " should I continue the gabapentin".  Note Roberta [name] now states the " sparking feeling " is now in all her extremities- intermittently.  This RN informed her she can stop the gabapentin but again reiterated concern that symptoms could be low potassium caused by the diuretic in her BP pill.  This RN again reiterated need to continue prescribing MD to inform for possible lab check and need for potassium replacement.  Roberta [name] stating " well I think what I want to do is get some food high in potassium "  This RN reiterated above is appropriate but again- recommended for her to contact the doctor who prescribes her Hyzaar for possible issues.  Roberta [name] inquired " so do think if I stop the gabapentin and eat potassium foods - I should call him next week ?"  This RN suggested she call MD sooner as well as recommended foods rich in potassium.  Per end of conversation Roberta [name] verbalized understanding of concern and health risk related to possible low potassium and need for contact with doctor who prescribes the Hyzaar.  No further needs at this time.

## 2019-02-25 ENCOUNTER — Encounter: Payer: Self-pay | Admitting: Oncology

## 2019-02-25 ENCOUNTER — Encounter (INDEPENDENT_AMBULATORY_CARE_PROVIDER_SITE_OTHER): Payer: Self-pay | Admitting: Family Medicine

## 2019-02-25 ENCOUNTER — Encounter: Payer: Self-pay | Admitting: Internal Medicine

## 2019-02-25 ENCOUNTER — Other Ambulatory Visit: Payer: Self-pay

## 2019-02-25 ENCOUNTER — Ambulatory Visit (INDEPENDENT_AMBULATORY_CARE_PROVIDER_SITE_OTHER): Payer: BC Managed Care – PPO | Admitting: Family Medicine

## 2019-02-25 VITALS — BP 103/70 | HR 80 | Temp 98.1°F | Ht 66.0 in | Wt 264.0 lb

## 2019-02-25 DIAGNOSIS — F418 Other specified anxiety disorders: Secondary | ICD-10-CM

## 2019-02-25 DIAGNOSIS — R0602 Shortness of breath: Secondary | ICD-10-CM

## 2019-02-25 DIAGNOSIS — R5383 Other fatigue: Secondary | ICD-10-CM | POA: Diagnosis not present

## 2019-02-25 DIAGNOSIS — Z6841 Body Mass Index (BMI) 40.0 and over, adult: Secondary | ICD-10-CM

## 2019-02-25 DIAGNOSIS — R7303 Prediabetes: Secondary | ICD-10-CM | POA: Diagnosis not present

## 2019-02-25 DIAGNOSIS — Z9189 Other specified personal risk factors, not elsewhere classified: Secondary | ICD-10-CM | POA: Diagnosis not present

## 2019-02-25 NOTE — Progress Notes (Signed)
Chief Complaint:   OBESITY Roberta [name] is here to discuss her progress with her obesity treatment plan along with follow-up of her obesity related diagnoses. Roberta [name] is on the Category 2 Plan and states she is following her eating plan approximately 60% of the time. [name] states she is exercising 0 minutes 0 times per week.  Today's visit was #: 50 Starting weight: 292 lbs Starting date: 10/08/2017 Today's weight: 264 lbs Today's date: 02/25/2019 Total lbs lost to date: 28 Total lbs lost since last in-office visit: 2  Interim History: [name] voices that work is stressful; with kids not showing up for class, and some personality differences. She is planning to get the vaccine Friday February 26th. Patient voices, she hasn't had an appetite. She is eating lots of salads and drinking Equate shakes. She figured out that variety is what she was looking for.  Subjective:   Prediabetes Roberta [name] has a diagnosis of prediabetes. Her last Hgb A1c was 6.4 later in January 2021. She is on metformin once daily. She continues to work on diet and exercise to decrease her risk of diabetes.   Lab Results  Component Value Date   HGBA1C 6.4 (H) 01/27/2019   Lab Results  Component Value Date   INSULIN 13.4 01/31/2018   INSULIN 10.6 10/08/2017   SOB (shortness of breath) on exertion Roberta [name] 's symptoms are unchanged since her initial appointment. She had recent breast cancer diagnosis and surgery.   Other fatigue Roberta [name] 's symptoms are unchanged since her initial appointment. Roberta [name] had a recent breast cancer diagnosis and surgery.  Depression with anxiety Roberta [name]'s symptoms improved, but she is unsure if it's medication related. She increased up to 50 mg recently.  At risk for diabetes mellitus Roberta [name] is at higher than average risk for developing diabetes due to her obesity and prediabetes.   Assessment/Plan:   Prediabetes Roberta [name] will continue metformin (no refill needed) and she will continue  to work on weight loss, exercise, and decreasing simple carbohydrates to help decrease the risk of diabetes.   SOB (shortness of breath) on exertion [name]'s shortness of breath appears to be obesity related and exercise induced. She will continue to work on weight loss and gradually increase exercise to treat her exercise induced shortness of breath. We will continue to monitor closely. Indirect Calorimetry will be ordered today.  Other fatigue Indirect Calorimetry will be ordered, and in the meanwhile, [name] will focus on self care including making healthy food choices, increasing physical activity and focusing on stress reduction.  Depression with anxiety [name] will need to follow up on medication change at the next appointment, and if no improvement, we will need to discuss medication change or medication increase.  At risk for diabetes mellitus [name] was given approximately 15 minutes of diabetes education and counseling today. We discussed intensive lifestyle modifications today with an emphasis on weight loss as well as increasing exercise and decreasing simple carbohydrates in her diet. We also reviewed medication options with an emphasis on risk versus benefit of those discussed.   Repetitive spaced learning was employed today to elicit superior memory formation and behavioral change.  Class 3 severe obesity with serious comorbidity and body mass index (BMI) of 40.0 to 44.9 in adult, unspecified obesity type (HCC) Roberta [name] is currently in the action stage of change. As such, her goal is to continue with weight loss efforts. She has agreed to the Category 2 Plan.   Exercise goals: Roberta [name] will continue as is.  Behavioral modification strategies: increasing lean protein intake, increasing vegetables, meal planning and cooking strategies, keeping healthy foods in the home and planning for success. We discussed a change of plan, but patient would like to stay on the Category 2 plan. Roberta [name] will  eat all of the food at each meal on the Category 2 plan, with plans to increase to Category 3 at the next two appointments.  Roberta [name] has agreed to follow-up with our clinic in 2 weeks. She was informed of the importance of frequent follow-up visits to maximize her success with intensive lifestyle modifications for her multiple health conditions.   Objective:   Blood pressure 103/70, pulse 80, temperature 98.1 F (36.7 C), temperature source Oral, height 5\' 6"  (1.676 m), weight 264 lb (119.7 kg), SpO2 99 %. Body mass index is 42.61 kg/m.  General: Cooperative, alert, well developed, in no acute distress. HEENT: Conjunctivae and lids unremarkable. Cardiovascular: Regular rhythm.  Lungs: Normal work of breathing. Neurologic: No focal deficits.   Lab Results  Component Value Date   CREATININE 0.92 01/30/2019   BUN 20 01/30/2019   NA 141 01/30/2019   K 3.5 01/30/2019   CL 104 01/30/2019   CO2 29 01/30/2019   Lab Results  Component Value Date   ALT 15 01/30/2019   AST 18 01/30/2019   ALKPHOS 67 01/30/2019   BILITOT 0.3 01/30/2019   Lab Results  Component Value Date   HGBA1C 6.4 (H) 01/27/2019   HGBA1C 6.0 (H) 09/25/2018   HGBA1C 5.9 (H) 07/17/2018   HGBA1C 6.1 (H) 01/31/2018   HGBA1C 6.0 (H) 10/08/2017   Lab Results  Component Value Date   INSULIN 13.4 01/31/2018   INSULIN 10.6 10/08/2017   Lab Results  Component Value Date   TSH 1.480 10/08/2017   Lab Results  Component Value Date   CHOL 112 10/08/2017   HDL 50 10/08/2017   LDLCALC 48 10/08/2017   TRIG 69 10/08/2017   CHOLHDL 2 06/25/2017   Lab Results  Component Value Date   WBC 5.3 01/30/2019   HGB 11.4 (L) 01/30/2019   HCT 35.8 (L) 01/30/2019   MCV 81.0 01/30/2019   PLT 241 01/30/2019   Lab Results  Component Value Date   IRON 162 (H) 01/22/2017   FERRITIN 14.5 01/22/2017    Ref. Range 01/27/2019 16:09  Vitamin D, 25-Hydroxy Latest Ref Range: 30.0 - 100.0 ng/mL 47.7    Attestation Statements:    Reviewed by clinician on day of visit: allergies, medications, problem list, medical history, surgical history, family history, social history, and previous encounter notes.  I, Doreene Nest, am acting as transcriptionist for Coralie Common, MD.  I have reviewed the above documentation for accuracy and completeness, and I agree with the above. - Ilene Qua, MD

## 2019-03-11 ENCOUNTER — Other Ambulatory Visit: Payer: Self-pay

## 2019-03-11 ENCOUNTER — Ambulatory Visit (INDEPENDENT_AMBULATORY_CARE_PROVIDER_SITE_OTHER): Payer: BC Managed Care – PPO | Admitting: Family Medicine

## 2019-03-11 ENCOUNTER — Encounter (INDEPENDENT_AMBULATORY_CARE_PROVIDER_SITE_OTHER): Payer: Self-pay | Admitting: Family Medicine

## 2019-03-11 VITALS — BP 109/74 | HR 73 | Temp 98.3°F | Ht 66.0 in | Wt 265.0 lb

## 2019-03-11 DIAGNOSIS — I10 Essential (primary) hypertension: Secondary | ICD-10-CM | POA: Diagnosis not present

## 2019-03-11 DIAGNOSIS — Z9189 Other specified personal risk factors, not elsewhere classified: Secondary | ICD-10-CM | POA: Diagnosis not present

## 2019-03-11 DIAGNOSIS — E66813 Obesity, class 3: Secondary | ICD-10-CM

## 2019-03-11 DIAGNOSIS — Z6841 Body Mass Index (BMI) 40.0 and over, adult: Secondary | ICD-10-CM

## 2019-03-11 DIAGNOSIS — R7303 Prediabetes: Secondary | ICD-10-CM | POA: Diagnosis not present

## 2019-03-11 MED ORDER — LOSARTAN POTASSIUM-HCTZ 100-25 MG PO TABS: 0.5000 | ORAL_TABLET | Freq: Every day | ORAL | 0 refills | Status: DC

## 2019-03-11 MED ORDER — METFORMIN HCL 500 MG PO TABS: 500.0000 mg | ORAL_TABLET | Freq: Every day | ORAL | 0 refills | Status: DC

## 2019-03-11 NOTE — Progress Notes (Signed)
Chief Complaint:   OBESITY Roberta [name] is here to discuss her progress with her obesity treatment plan along with follow-up of her obesity related diagnoses. Roberta [name] is on the Category 2 Plan and states she is following her eating plan approximately 95% of the time. Roberta [name] states she is walking 1 1/2 miles, 7 times per week.  Today's visit was #: 56 Starting weight: 292 lbs Starting date: 10/08/2017 Today's weight: 265 lbs Today's date: 03/11/2019 Total lbs lost to date: 27 Total lbs lost since last in-office visit: 0  Interim History: Roberta [name] voices that she is getting in at least six ounces of protein at dinner, and otherwise, she is following the plan all the other time. She has no hunger, but she does have occasional cravings.  Subjective:   Prediabetes  Roberta [name] has a diagnosis of prediabetes. Her last A1c was 6.4 on metformin daily. She denies any GI side effects of metformin. Roberta [name] has cravings occasionally for salty and sweet. She continues to work on diet and exercise to decrease her risk of diabetes.  Lab Results  Component Value Date   HGBA1C 6.4 (H) 01/27/2019   Lab Results  Component Value Date   INSULIN 13.4 01/31/2018   INSULIN 10.6 10/08/2017   Essential hypertension  Roberta [name] has no dizziness, lightheadedness, chest pain, chest pressure or headache. Her blood pressure was low at the last appointment and today. She is on Hyzaar 100-25 mg.  BP Readings from Last 3 Encounters:  03/11/19 109/74  02/25/19 103/70  02/10/19 119/77   Lab Results  Component Value Date   CREATININE 0.92 01/30/2019   CREATININE 0.84 10/31/2018   CREATININE 1.03 (H) 10/01/2018   At risk for hyperglycemia Roberta [name] is at risk for hyperglycemia due to insulin resistance, prediabetes or diabetes.  Assessment/Plan:   Prediabetes  Roberta [name] will continue to work on weight loss, exercise, and decreasing simple carbohydrates to help decrease the risk of diabetes. Roberta [name] agrees to continue  metFORMIN (GLUCOPHAGE) 500 MG PO qAM #30 with no refills.  Essential hypertension Roberta [name] is working on healthy weight loss and exercise to improve blood pressure control. She agrees to take HCTZ-Losartan 12.5 mg-50 mg once daily #30 with no refills. We will watch for signs of hypotension as she continues her lifestyle modifications.  At risk for hyperglycemia Roberta [name] was given approximately 15 minutes of counseling today regarding prevention of hyperglycemia. She was advised of hyperglycemia causes and the fact hyperglycemia is often asymptomatic. [place] was instructed to avoid skipping meals, eat regular protein rich meals and schedule low calorie but protein rich snacks as needed.   Repetitive spaced learning was employed today to elicit superior memory formation and behavioral change  Class 3 severe obesity with serious comorbidity and body mass index (BMI) of 40.0 to 44.9 in adult, unspecified obesity type (HCC) Roberta [name] is currently in the action stage of change. As such, her goal is to continue with weight loss efforts. She has agreed to the Category 2 Plan +100 calories.   Exercise goals: Roberta [name] will continue her current exercise regimen.  Behavioral modification strategies: increasing lean protein intake, increasing vegetables, meal planning and cooking strategies, keeping healthy foods in the home and planning for success. Roberta [name] will get up to 8 ounces or the equivalent for her dinner meal.  Roberta [name] has agreed to follow-up with our clinic in 2 weeks. She was informed of the importance of frequent follow-up visits to maximize her success with intensive lifestyle modifications for her multiple health conditions.  Objective:   Blood pressure 109/74, pulse 73, temperature 98.3 F (36.8 C), temperature source Oral, height 5\' 6"  (1.676 m), weight 265 lb (120.2 kg), SpO2 100 %. Body mass index is 42.77 kg/m.  General: Cooperative, alert, well developed, in no acute distress. HEENT: Conjunctivae  and lids unremarkable. Cardiovascular: Regular rhythm.  Lungs: Normal work of breathing. Neurologic: No focal deficits.   Lab Results  Component Value Date   CREATININE 0.92 01/30/2019   BUN 20 01/30/2019   NA 141 01/30/2019   K 3.5 01/30/2019   CL 104 01/30/2019   CO2 29 01/30/2019   Lab Results  Component Value Date   ALT 15 01/30/2019   AST 18 01/30/2019   ALKPHOS 67 01/30/2019   BILITOT 0.3 01/30/2019   Lab Results  Component Value Date   HGBA1C 6.4 (H) 01/27/2019   HGBA1C 6.0 (H) 09/25/2018   HGBA1C 5.9 (H) 07/17/2018   HGBA1C 6.1 (H) 01/31/2018   HGBA1C 6.0 (H) 10/08/2017   Lab Results  Component Value Date   INSULIN 13.4 01/31/2018   INSULIN 10.6 10/08/2017   Lab Results  Component Value Date   TSH 1.480 10/08/2017   Lab Results  Component Value Date   CHOL 112 10/08/2017   HDL 50 10/08/2017   LDLCALC 48 10/08/2017   TRIG 69 10/08/2017   CHOLHDL 2 06/25/2017   Lab Results  Component Value Date   WBC 5.3 01/30/2019   HGB 11.4 (L) 01/30/2019   HCT 35.8 (L) 01/30/2019   MCV 81.0 01/30/2019   PLT 241 01/30/2019   Lab Results  Component Value Date   IRON 162 (H) 01/22/2017   FERRITIN 14.5 01/22/2017    Ref. Range 01/27/2019 16:09  Vitamin D, 25-Hydroxy Latest Ref Range: 30.0 - 100.0 ng/mL 47.7    Attestation Statements:   Reviewed by clinician on day of visit: allergies, medications, problem list, medical history, surgical history, family history, social history, and previous encounter notes.  I, Doreene Nest, am acting as transcriptionist for Coralie Common, MD.  I have reviewed the above documentation for accuracy and completeness, and I agree with the above. - Ilene Qua, MD

## 2019-03-11 NOTE — Progress Notes (Signed)
[place]  Telephone:([phone]) [phone] Fax:([phone]) [phone]    ID: Roberta [name] DOB: 08-22-1965  MR#: [id number]  WOE#:[id number]  Patient Care Team: [name], MD as PCP - General (Internal Medicine) Himmelrich, Bryson Ha, RD (Inactive) as Dietitian ([place]) Mauro Kaufmann, RN as Oncology Nurse Navigator Rockwell Germany, RN as Oncology Nurse Navigator Fanny Skates, MD as Consulting Physician (General Surgery) [name], Roberta Dad, MD as Consulting Physician (Oncology) Kyung Rudd, MD as Consulting Physician (Radiation Oncology) Juanita Craver, MD as Consulting Physician (Gastroenterology) Sherlyn Hay, DO as Consulting Physician (Obstetrics and Gynecology) OTHER MD:    CHIEF COMPLAINT: Estrogen receptor positive breast cancer (s/p left mastectomy)  CURRENT TREATMENT: tamoxifen   INTERVAL HISTORY: Roberta [name] was scheduled for a video visit with me today but although I went on [employer] on time and also called her home number was not able to communicate with her.  I also called her at work and was told that she was at home.     REVIEW OF SYSTEMS: Roberta [name]    HISTORY OF CURRENT ILLNESS: From the original intake note:  Roberta [name] had routine screening mammography on 07/25/2018 which was normal.  Less than a month later she was found by Dr. Terri Piedra to have palpable lumps in the superior left breast at a clinic exam.  [name] underwent unilateral left diagnostic mammography with tomography and left breast ultrasonography at [facility] on 08/23/2018 showing: Breast Density Category C. No suspicious masses are seen on the tomosynthesis images of the left breast. There are 2 groups of indeterminate calcifications however, one in the superior anterior superficial left breast which spans 8 mm. In the upper inner left breast, there is a 2.2 cm area of loosely grouped amorphous calcifications. Normal fibroglandular tissue in the upper inner and upper  outer quadrants of the left breast.  There were no sonographic abnormalities to correspond with the masses identified on the clinical breast exam.  Accordingly on 08/29/2018 she proceeded to stereotactic biopsy of the left breast area in question. The pathology from this procedure showed ([id number]): invasive mammary carcinoma, nottingham grade 2 of 3. Prognostic indicators significant for: estrogen receptor, 100% positive and progesterone receptor, 2% positive, both with strong staining intensity. Proliferation marker Ki67 at 10%. HER2 negative (0+) by immunohistochemistry.  An additional biopsy of the left breast was performed on the same day. The pathology from this procedure showed ([id number]): ductal carcinoma in situ. Prognostic indicators significant for: estrogen receptor, 95% positie and progesterone receptor, 100% positive, both with strong staining intensity.  Finally, she underwent a left axillary ultrasound on 09/02/2018 showing: No left axillary adenopathy.  The patient's subsequent history is as detailed below.   PAST MEDICAL HISTORY: Past Medical History:  Diagnosis Date   Back pain    Cancer ([place])    multifocal left breast   Depression    Hip pain    Hypertension    Obesity    PONV (postoperative nausea and vomiting)    Prediabetes    Swelling    bilat LE   Wears glasses     PAST SURGICAL HISTORY: Past Surgical History:  Procedure Laterality Date   CHOLECYSTECTOMY     COLONOSCOPY     DILATION AND CURETTAGE OF UTERUS     GASTRIC BYPASS     KNEE SURGERY  2002   Ligament repair   MASTECTOMY W/ SENTINEL NODE BIOPSY Left 10/01/2018   Procedure: LEFT TOTAL MASTECTOMY WITH SENTINEL LYMPH NODE BIOPSY AND  BLUE DYE INJECTION;  Surgeon: Fanny Skates, MD;  Location: [facility];  Service: General;  Laterality: Left;    FAMILY HISTORY: Family History  Problem Relation Age of Onset   Diabetes Mother    Hypertension Mother    Heart disease Mother     Stroke Mother    Kidney disease Mother    Obesity Mother    Cancer Maternal Aunt        lung   Cancer Maternal Uncle        lung   Throat cancer Maternal Aunt    Roberta [name] died in his mid 69s from alcoholism.  The patient's mother died at age 52 from end-stage renal disease.  The patient had 2 brothers, 3 sisters, one brother dying from complications of diabetes.  There is no cancer in the immediate family.  There are 2 maternal uncles and 2 maternal aunts who died from lung cancer, all of whom smoked.  A maternal aunt also had a history of throat cancer.   GYNECOLOGIC HISTORY:  No LMP recorded. (Menstrual status: Perimenopausal). 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  She is divorced, lives alone.   ADVANCED DIRECTIVES: She intends to name her sister Roberta [name] as healthcare power of attorney.  Roberta [name] can be reached at [phone].   HEALTH MAINTENANCE: Social History   Tobacco Use   Smoking status: Never Smoker   Smokeless tobacco: Never Used  Substance Use Topics   Alcohol use: Yes    Comment: occasional wine   Drug use: No    Colonoscopy: August 2020  PAP: Up-to-date  Bone density: 2005   Allergies  Allergen Reactions   Lisinopril Swelling    Swelling of the tongue   Penicillins     UNSPECIFIED CHILDHOOD REACTION   Norvasc [Amlodipine Besylate] Rash   Red Dye Rash   Tetanus Toxoids Swelling    Swelling at site of injection    Current Outpatient Medications  Medication Sig Dispense Refill   Biotin 10000 MCG TABS Take 10,000 mcg by mouth at bedtime.      Cholecalciferol (VITAMIN D-3) 5000 units TABS Take 1 tablet by mouth daily. (Patient taking differently: Take 5,000  Units by mouth daily. ) 30 tablet    gabapentin (NEURONTIN) 300 MG capsule Take 1 capsule (300 mg total) by mouth at bedtime. 90 capsule 4   losartan-hydrochlorothiazide (HYZAAR) 100-25 MG tablet Take 0.5 tablets by mouth daily. 30 tablet 0   metFORMIN (GLUCOPHAGE) 500 MG tablet Take 1 tablet (500 mg total) by mouth daily with breakfast. 30 tablet 0   Multiple Vitamin (MULTIVITAMIN) tablet Take 1 tablet by mouth daily.     mupirocin ointment (BACTROBAN) 2 % Apply 1 application topically 2 (two) times daily as needed. 22 g 0   Omega-3 Fatty Acids (OMEGA 3 500 PO) Take 500 mg by mouth daily.      sertraline (ZOLOFT) 50 MG tablet Take 1 tablet (50 mg total) by mouth daily. 30 tablet 0   tamoxifen (NOLVADEX) 20 MG tablet Take 1 tablet (20 mg total) by mouth daily. 90 tablet 4   vitamin E 100 UNIT capsule Take 180 Units by mouth at bedtime.      zinc gluconate 50 MG tablet Take  50 mg by mouth at bedtime.      No current facility-administered medications for this visit.     OBJECTIVE: Morbidly obese African-American woman who appears stated age  There were no vitals filed for this visit. Wt Readings from Last 3 Encounters:  03/11/19 265 lb (120.2 kg)  02/25/19 264 lb (119.7 kg)  02/10/19 266 lb (120.7 kg)   There is no height or weight on file to calculate BMI.    ECOG FS:1 - Symptomatic but completely ambulatory  Failed telemedicine visit 03/12/2019   LAB RESULTS:  CMP     Component Value Date/Time   NA 141 01/30/2019 1418   NA 139 07/17/2018 1547   K 3.5 01/30/2019 1418   CL 104 01/30/2019 1418   CO2 29 01/30/2019 1418   GLUCOSE 140 (H) 01/30/2019 1418   BUN 20 01/30/2019 1418   BUN 16 07/17/2018 1547   CREATININE 0.92 01/30/2019 1418   CREATININE 0.95 09/04/2018 0840   CALCIUM 8.7 (L) 01/30/2019 1418   PROT 7.1 01/30/2019 1418   PROT 6.9 07/17/2018 1547   ALBUMIN 3.0 (L) 01/30/2019 1418   ALBUMIN 3.6 (L) 07/17/2018 1547   AST 18 01/30/2019 1418   AST 13 (L)  09/04/2018 0840   ALT 15 01/30/2019 1418   ALT 10 09/04/2018 0840   ALKPHOS 67 01/30/2019 1418   BILITOT 0.3 01/30/2019 1418   BILITOT 0.4 09/04/2018 0840   GFRNONAA >60 01/30/2019 1418   GFRNONAA >60 09/04/2018 0840   GFRAA >60 01/30/2019 1418   GFRAA >60 09/04/2018 0840    No results found for: TOTALPROTELP, ALBUMINELP, A1GS, A2GS, BETS, BETA2SER, GAMS, MSPIKE, SPEI  No results found for: KPAFRELGTCHN, LAMBDASER, KAPLAMBRATIO  Lab Results  Component Value Date   WBC 5.3 01/30/2019   NEUTROABS 3.0 01/30/2019   HGB 11.4 (L) 01/30/2019   HCT 35.8 (L) 01/30/2019   MCV 81.0 01/30/2019   PLT 241 01/30/2019    No results found for: LABCA2  No components found for: UXNATF573  No results for input(s): INR in the last 168 hours.  No results found for: LABCA2  No results found for: UKG254  No results found for: YHC623  No results found for: JSE831  No results found for: CA2729  No components found for: HGQUANT  No results found for: CEA1 / No results found for: CEA1   No results found for: AFPTUMOR  No results found for: CHROMOGRNA  No results found for: HGBA, HGBA2QUANT, HGBFQUANT, HGBSQUAN (Hemoglobinopathy evaluation)   No results found for: LDH  Lab Results  Component Value Date   IRON 162 (H) 01/22/2017   (Iron and TIBC)  Lab Results  Component Value Date   FERRITIN 14.5 01/22/2017    Urinalysis    Component Value Date/Time   COLORURINE YELLOW 06/25/2007 0640   APPEARANCEUR CLOUDY (A) 06/25/2007 0640   LABSPEC 1.016 06/25/2007 0640   PHURINE 6.0 06/25/2007 0640   GLUCOSEU NEGATIVE 06/25/2007 0640   HGBUR LARGE (A) 06/25/2007 0640   BILIRUBINUR NEGATIVE 06/25/2007 0640   KETONESUR NEGATIVE 06/25/2007 0640   PROTEINUR 100 (A) 06/25/2007 0640   UROBILINOGEN 0.2 06/25/2007 0640   NITRITE POSITIVE (A) 06/25/2007 0640   LEUKOCYTESUR MODERATE (A) 06/25/2007 0640     STUDIES:  No results found.   ELIGIBLE FOR AVAILABLE RESEARCH PROTOCOL:  no   ASSESSMENT: 54 y.o. [place], [place] woman status post left breast upper inner quadrant biopsy for a clinical T2 N0, stage IB invasive ductal carcinoma, grade 2, estrogen and progesterone  receptor positive, HER-2 not amplified, with an MIB-1-1 of 2%  (a) a second biopsy same day different quadrant showed ductal carcinoma in situ  (1) left breast s/p mastectomy on 10/01/2018 for a  pT1b pN0, stage IA invasive ductal carcinoma, grade 2, margins negative.  (a) a total of 5 lymph nodes biopsied were negative.    (2) Oncotype score of 20 predicts a risk of recurrence outside the breast within the next 9 years of 6% if her only systemic therapy is antiestrogens for 5 years.  Also predicts no benefit from chemotherapy.  (3) adjuvant radiation not indicated  (4)  tamoxifen started November 2020   PLAN: Lilienne was not available for her telemedicine visit 03/12/2019.  Reminder letter has been written.  Roberta Dad. Massai Hankerson, MD  03/11/19 5:31 PM Medical Oncology and Hematology [facility] [place], [place] [address] Tel. [phone]    Fax. [phone]    I, Wilburn Mylar, am acting as scribe for Dr. Virgie Dad. Jalacia Mattila.  I, Lurline Del MD, have reviewed the above documentation for accuracy and completeness, and I agree with the above.    *Total Encounter Time as defined by the Centers for Medicare and Medicaid Services includes, in addition to the face-to-face time of a patient visit (documented in the note above) non-face-to-face time: obtaining and reviewing outside history, ordering and reviewing medications, tests or procedures, care coordination (communications with other health care professionals or caregivers) and documentation in the medical record.

## 2019-03-12 ENCOUNTER — Inpatient Hospital Stay: Payer: BC Managed Care – PPO | Attending: Adult Health | Admitting: Oncology

## 2019-03-12 ENCOUNTER — Encounter: Payer: Self-pay | Admitting: Oncology

## 2019-03-12 ENCOUNTER — Encounter: Payer: Self-pay | Admitting: Internal Medicine

## 2019-03-12 DIAGNOSIS — K9089 Other intestinal malabsorption: Secondary | ICD-10-CM

## 2019-03-12 DIAGNOSIS — Z6841 Body Mass Index (BMI) 40.0 and over, adult: Secondary | ICD-10-CM

## 2019-03-12 DIAGNOSIS — C50212 Malignant neoplasm of upper-inner quadrant of left female breast: Secondary | ICD-10-CM

## 2019-03-12 DIAGNOSIS — Z170 Estrogen receptor positive status [ER+]: Secondary | ICD-10-CM

## 2019-03-13 ENCOUNTER — Telehealth: Payer: BC Managed Care – PPO | Admitting: Oncology

## 2019-03-16 ENCOUNTER — Other Ambulatory Visit: Payer: Self-pay | Admitting: Adult Health

## 2019-03-16 ENCOUNTER — Encounter (INDEPENDENT_AMBULATORY_CARE_PROVIDER_SITE_OTHER): Payer: Self-pay | Admitting: Family Medicine

## 2019-03-16 DIAGNOSIS — C50212 Malignant neoplasm of upper-inner quadrant of left female breast: Secondary | ICD-10-CM

## 2019-03-16 DIAGNOSIS — Z170 Estrogen receptor positive status [ER+]: Secondary | ICD-10-CM

## 2019-03-18 NOTE — Progress Notes (Signed)
[facility]  [phone] [phone]) [phone]    ID: Verba Ainley DOB: 1965-06-19  MR#: [id number]  KZS#:[id number]  Patient Care Team: [name], MD as PCP - General (Internal Medicine) Himmelrich, Bryson Ha, RD (Inactive) as Dietitian (Bariatrics) Mauro Kaufmann, RN as Oncology Nurse Navigator Rockwell Germany, RN as Oncology Nurse Navigator Fanny Skates, MD as Consulting Physician (General Surgery) Nikolus Marczak, Virgie Dad, MD as Consulting Physician (Oncology) Kyung Rudd, MD as Consulting Physician (Radiation Oncology) Juanita Craver, MD as Consulting Physician (Gastroenterology) Sherlyn Hay, DO as Consulting Physician (Obstetrics and Gynecology) OTHER MD:  I connected with Waldemar Dickens on 03/18/19 at 12:00 PM EDT by video enabled telemedicine visit and verified that I am speaking with the correct person using two identifiers.   I discussed the limitations, risks, security and privacy concerns of performing an evaluation and management service by telemedicine and the availability of in-person appointments. I also discussed with the patient that there may be a patient responsible charge related to this service. The patient expressed understanding and agreed to proceed.   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  She continues on tamoxifen. At her last visit on 01/30/2019, we started her on gabapentin for her hot flashes.  That has helped the hot flashes considerably.  It also helped a little bit of tingling she was having in her fingers.  Since her last visit, she has not undergone any additional  studies. She will be due for right mammography in 07/2019.   REVIEW OF SYSTEMS: Ebonique continues to work from home.  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  She has had her first [place] vaccine dose on the second dose is due on March 22, 2019.  She noticed a little bit of left arm swelling.  She did have physical therapy but was not able to completed for insurance reason.  She did not receive a compression sleeve.  A detailed review of systems today was otherwise stable.   HISTORY OF CURRENT ILLNESS: From the original intake note:  Ruchy Wildrick had routine screening mammography on 07/25/2018 which was normal.  Less than a month later she was found by Dr. Terri Piedra to have palpable lumps in the superior left breast at a clinic exam.  Adilene underwent unilateral left diagnostic mammography with tomography and left breast ultrasonography at [facility] on 08/23/2018 showing: Breast Density Category C. No suspicious masses are seen on the tomosynthesis images of the left breast. There are 2 groups of indeterminate calcifications however, one in the superior anterior superficial left breast which spans 8 mm. In the upper inner left breast, there is a 2.2 cm area of loosely grouped amorphous calcifications. Normal fibroglandular tissue in the upper inner and upper outer quadrants of the left breast.  There were no sonographic abnormalities to correspond with the masses identified on the clinical breast exam.  Accordingly on 08/29/2018 she proceeded to stereotactic biopsy of the left breast area in question. The pathology from this procedure showed ([id number]): invasive mammary carcinoma, nottingham grade 2 of 3. Prognostic indicators significant for: estrogen receptor, 100% positive and progesterone receptor,  2% positive, both with strong staining intensity. Proliferation marker Ki67 at 10%. HER2 negative (0+) by immunohistochemistry.  An additional biopsy  of the left breast was performed on the same day. The pathology from this procedure showed ([id number]): ductal carcinoma in situ. Prognostic indicators significant for: estrogen receptor, 95% positie and progesterone receptor, 100% positive, both with strong staining intensity.  Finally, she underwent a left axillary ultrasound on 09/02/2018 showing: No left axillary adenopathy.  The patient's subsequent history is as detailed below.   PAST MEDICAL HISTORY: Past Medical History:  Diagnosis Date  . Back pain   . Cancer [facility])    multifocal left breast  . Depression   . Hip pain   . Hypertension   . Obesity   . PONV (postoperative nausea and vomiting)   . Prediabetes   . Swelling    bilat LE  . Wears glasses     PAST SURGICAL HISTORY: Past Surgical History:  Procedure Laterality Date  . CHOLECYSTECTOMY    . COLONOSCOPY    . DILATION AND CURETTAGE OF UTERUS    . GASTRIC BYPASS    . KNEE SURGERY  2002   Ligament repair  . MASTECTOMY W/ SENTINEL NODE BIOPSY Left 10/01/2018   Procedure: LEFT TOTAL MASTECTOMY WITH SENTINEL LYMPH NODE BIOPSY AND BLUE DYE INJECTION;  Surgeon: Fanny Skates, MD;  Location: [facility];  Service: General;  Laterality: Left;    FAMILY HISTORY: Family History  Problem Relation Age of Onset  . Diabetes Mother   . Hypertension Mother   . Heart disease Mother   . Stroke Mother   . Kidney disease Mother   . Obesity Mother   . Cancer Maternal Aunt        lung  . Cancer Maternal Uncle        lung  . Throat cancer Maternal Aunt    Tomeshia's father died in his mid 72s from alcoholism.  The patient's mother died at age 72 from end-stage renal disease.  The patient had 2 brothers, 3 sisters, one brother dying from complications of diabetes.  There is no cancer in the immediate family.  There are 2 maternal uncles and 2 maternal aunts who died from lung cancer, all of whom smoked.  A maternal aunt also had a history of throat cancer.   GYNECOLOGIC HISTORY:    No LMP recorded. (Menstrual status: Perimenopausal). 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  She is divorced, lives alone.   ADVANCED DIRECTIVES: She intends to name her sister [name] as healthcare power of attorney.  [name] can be reached at [phone].   HEALTH MAINTENANCE: Social History   Tobacco Use  . Smoking status: Never Smoker  . Smokeless tobacco: Never Used  Substance Use Topics  . Alcohol use: Yes    Comment: occasional wine  . Drug use: No    Colonoscopy: August 2020  PAP: Up-to-date  Bone density: 2005   Allergies  Allergen Reactions  . Lisinopril Swelling    Swelling of the tongue  . Penicillins     UNSPECIFIED CHILDHOOD REACTION  . Norvasc [Amlodipine Besylate] Rash  . Red Dye Rash  . Tetanus Toxoids Swelling    Swelling at site of injection    Current  Outpatient Medications  Medication Sig Dispense Refill  . Biotin 10000 MCG TABS Take 10,000 mcg by mouth at bedtime.     . Cholecalciferol (VITAMIN D-3) 5000 units TABS Take 1 tablet by mouth daily. (Patient taking differently: Take 5,000 Units by mouth daily. ) 30 tablet   . gabapentin (NEURONTIN) 300 MG capsule Take 1 capsule (300 mg total) by mouth at bedtime. 90 capsule 4  . losartan-hydrochlorothiazide (HYZAAR) 100-25 MG tablet Take 0.5 tablets by mouth daily. 30 tablet 0  . metFORMIN (GLUCOPHAGE) 500 MG tablet Take 1 tablet (500 mg total) by mouth daily with breakfast. 30 tablet 0  . Multiple Vitamin (MULTIVITAMIN) tablet Take 1 tablet by mouth daily.    . mupirocin ointment (BACTROBAN) 2 % Apply 1 application topically 2 (two) times daily as needed. 22 g 0  . Omega-3 Fatty Acids (OMEGA 3 500 PO) Take 500 mg by mouth  daily.     . sertraline (ZOLOFT) 50 MG tablet Take 1 tablet (50 mg total) by mouth daily. 30 tablet 0  . tamoxifen (NOLVADEX) 20 MG tablet TAKE 1 TABLET BY MOUTH EVERY DAY 90 tablet 3  . vitamin E 100 UNIT capsule Take 180 Units by mouth at bedtime.     Marland Kitchen zinc gluconate 50 MG tablet Take 50 mg by mouth at bedtime.      No current facility-administered medications for this visit.     OBJECTIVE: Morbidly obese African-American woman who appears well  There were no vitals filed for this visit. Wt Readings from Last 3 Encounters:  03/11/19 265 lb (120.2 kg)  02/25/19 264 lb (119.7 kg)  02/10/19 266 lb (120.7 kg)   There is no height or weight on file to calculate BMI.    ECOG FS:1 - Symptomatic but completely ambulatory  Telemedicine visit 03/19/2019   LAB RESULTS:  CMP     Component Value Date/Time   NA 141 01/30/2019 1418   NA 139 07/17/2018 1547   K 3.5 01/30/2019 1418   CL 104 01/30/2019 1418   CO2 29 01/30/2019 1418   GLUCOSE 140 (H) 01/30/2019 1418   BUN 20 01/30/2019 1418   BUN 16 07/17/2018 1547   CREATININE 0.92 01/30/2019 1418   CREATININE 0.95 09/04/2018 0840   CALCIUM 8.7 (L) 01/30/2019 1418   PROT 7.1 01/30/2019 1418   PROT 6.9 07/17/2018 1547   ALBUMIN 3.0 (L) 01/30/2019 1418   ALBUMIN 3.6 (L) 07/17/2018 1547   AST 18 01/30/2019 1418   AST 13 (L) 09/04/2018 0840   ALT 15 01/30/2019 1418   ALT 10 09/04/2018 0840   ALKPHOS 67 01/30/2019 1418   BILITOT 0.3 01/30/2019 1418   BILITOT 0.4 09/04/2018 0840   GFRNONAA >60 01/30/2019 1418   GFRNONAA >60 09/04/2018 0840   GFRAA >60 01/30/2019 1418   GFRAA >60 09/04/2018 0840    No results found for: TOTALPROTELP, ALBUMINELP, A1GS, A2GS, BETS, BETA2SER, GAMS, MSPIKE, SPEI  No results found for: KPAFRELGTCHN, LAMBDASER, KAPLAMBRATIO  Lab Results  Component Value Date   WBC 5.3 01/30/2019   NEUTROABS 3.0 01/30/2019   HGB 11.4 (L) 01/30/2019   HCT 35.8 (L) 01/30/2019   MCV 81.0 01/30/2019   PLT 241  01/30/2019    No results found for: LABCA2  No components found for: FGHWEX937  No results for input(s): INR in the last 168 hours.  No results found for: LABCA2  No results found for: JIR678  No results found for: LFY101  No results found for: BPZ025  No  results found for: CA2729  No components found for: HGQUANT  No results found for: CEA1 / No results found for: CEA1   No results found for: AFPTUMOR  No results found for: CHROMOGRNA  No results found for: HGBA, HGBA2QUANT, HGBFQUANT, HGBSQUAN (Hemoglobinopathy evaluation)   No results found for: LDH  Lab Results  Component Value Date   IRON 162 (H) 01/22/2017   (Iron and TIBC)  Lab Results  Component Value Date   FERRITIN 14.5 01/22/2017    Urinalysis    Component Value Date/Time   COLORURINE YELLOW 06/25/2007 0640   APPEARANCEUR CLOUDY (A) 06/25/2007 0640   LABSPEC 1.016 06/25/2007 0640   PHURINE 6.0 06/25/2007 0640   GLUCOSEU NEGATIVE 06/25/2007 0640   HGBUR LARGE (A) 06/25/2007 0640   BILIRUBINUR NEGATIVE 06/25/2007 0640   KETONESUR NEGATIVE 06/25/2007 0640   PROTEINUR 100 (A) 06/25/2007 0640   UROBILINOGEN 0.2 06/25/2007 0640   NITRITE POSITIVE (A) 06/25/2007 0640   LEUKOCYTESUR MODERATE (A) 06/25/2007 0640     STUDIES:  No results found.   ELIGIBLE FOR AVAILABLE RESEARCH PROTOCOL: no   ASSESSMENT: 54 y.o. [place], [place] woman status post left breast upper inner quadrant biopsy for a clinical T2 N0, stage IB invasive ductal carcinoma, grade 2, estrogen and progesterone receptor positive, HER-2 not amplified, with an MIB-1-1 of 2%  (a) a second biopsy same day different quadrant showed ductal carcinoma in situ  (1) left breast s/p mastectomy on 10/01/2018 for a  pT1b pN0, stage IA invasive ductal carcinoma, grade 2, margins negative.  (a) a total of 5 lymph nodes biopsied were negative.    (2) Oncotype score of 20 predicts a risk of recurrence outside the breast within the next 9 years of  6% if her only systemic therapy is antiestrogens for 5 years.  Also predicts no benefit from chemotherapy.  (3) adjuvant radiation not indicated  (4)  tamoxifen started November 2020   PLAN: Zulma is tolerating tamoxifen better with the help of gabapentin and the plan at this point is to continue that a total of 5 years.  I just wrote a compression sleeve order and put it in the mail today so she can get that filled through a medical supply store.  She never did receive all the physical therapy that she felt was needed and perhaps as the year goes on and she meets her deductible on the current insurance that we could remedy that.  She has been very interested in participating in the finding your new normal and I am making sure our program coordinator is aware of that.  She will be due for mammography in July and I will see her shortly after that.  She knows to call for any other issue that may develop before then.     Virgie Dad. Carreen Milius, MD  03/18/19 4:28 PM Medical Oncology and Hematology [facility] [address] [place], [place] [address] Tel. [phone]    Fax. [phone]    I, Wilburn Mylar, am acting as scribe for Dr. Virgie Dad. Hjalmer Iovino.  I, Lurline Del MD, have reviewed the above documentation for accuracy and completeness, and I agree with the above.   *Total Encounter Time as defined by the Centers for Medicare and Medicaid Services includes, in addition to the face-to-face time of a patient visit (documented in the note above) non-face-to-face time: obtaining and reviewing outside history, ordering and reviewing medications, tests or procedures, care coordination (communications with other health care professionals or caregivers) and  documentation in the medical record.

## 2019-03-19 ENCOUNTER — Inpatient Hospital Stay (HOSPITAL_BASED_OUTPATIENT_CLINIC_OR_DEPARTMENT_OTHER): Payer: BC Managed Care – PPO | Admitting: Oncology

## 2019-03-19 DIAGNOSIS — C50812 Malignant neoplasm of overlapping sites of left female breast: Secondary | ICD-10-CM | POA: Diagnosis not present

## 2019-03-19 DIAGNOSIS — D509 Iron deficiency anemia, unspecified: Secondary | ICD-10-CM

## 2019-03-19 DIAGNOSIS — Z170 Estrogen receptor positive status [ER+]: Secondary | ICD-10-CM

## 2019-03-19 DIAGNOSIS — K9089 Other intestinal malabsorption: Secondary | ICD-10-CM

## 2019-03-19 DIAGNOSIS — Z9884 Bariatric surgery status: Secondary | ICD-10-CM

## 2019-03-19 DIAGNOSIS — E559 Vitamin D deficiency, unspecified: Secondary | ICD-10-CM

## 2019-03-19 DIAGNOSIS — C50212 Malignant neoplasm of upper-inner quadrant of left female breast: Secondary | ICD-10-CM | POA: Diagnosis not present

## 2019-03-19 DIAGNOSIS — F329 Major depressive disorder, single episode, unspecified: Secondary | ICD-10-CM

## 2019-03-19 DIAGNOSIS — R9230 Dense breasts, unspecified: Secondary | ICD-10-CM

## 2019-03-19 DIAGNOSIS — F419 Anxiety disorder, unspecified: Secondary | ICD-10-CM

## 2019-03-19 DIAGNOSIS — R635 Abnormal weight gain: Secondary | ICD-10-CM | POA: Diagnosis not present

## 2019-03-19 DIAGNOSIS — R7303 Prediabetes: Secondary | ICD-10-CM

## 2019-03-19 DIAGNOSIS — Z6841 Body Mass Index (BMI) 40.0 and over, adult: Secondary | ICD-10-CM

## 2019-03-19 DIAGNOSIS — F32A Depression, unspecified: Secondary | ICD-10-CM

## 2019-03-19 DIAGNOSIS — R922 Inconclusive mammogram: Secondary | ICD-10-CM

## 2019-04-01 ENCOUNTER — Ambulatory Visit (INDEPENDENT_AMBULATORY_CARE_PROVIDER_SITE_OTHER): Payer: BC Managed Care – PPO | Admitting: Family Medicine

## 2019-04-03 ENCOUNTER — Ambulatory Visit (INDEPENDENT_AMBULATORY_CARE_PROVIDER_SITE_OTHER): Payer: BC Managed Care – PPO | Admitting: Family Medicine

## 2019-04-03 ENCOUNTER — Other Ambulatory Visit: Payer: Self-pay

## 2019-04-03 ENCOUNTER — Encounter (INDEPENDENT_AMBULATORY_CARE_PROVIDER_SITE_OTHER): Payer: Self-pay | Admitting: Family Medicine

## 2019-04-03 VITALS — BP 119/76 | HR 82 | Temp 97.9°F | Ht 66.0 in | Wt 264.0 lb

## 2019-04-03 DIAGNOSIS — Z9189 Other specified personal risk factors, not elsewhere classified: Secondary | ICD-10-CM

## 2019-04-03 DIAGNOSIS — I10 Essential (primary) hypertension: Secondary | ICD-10-CM

## 2019-04-03 DIAGNOSIS — F418 Other specified anxiety disorders: Secondary | ICD-10-CM | POA: Diagnosis not present

## 2019-04-03 DIAGNOSIS — Z6841 Body Mass Index (BMI) 40.0 and over, adult: Secondary | ICD-10-CM

## 2019-04-03 MED ORDER — LOSARTAN POTASSIUM-HCTZ 50-12.5 MG PO TABS: 1.0000 | ORAL_TABLET | Freq: Every day | ORAL | 0 refills | Status: DC

## 2019-04-03 MED ORDER — SERTRALINE HCL 50 MG PO TABS: 50.0000 mg | ORAL_TABLET | Freq: Every day | ORAL | 0 refills | Status: DC

## 2019-04-03 NOTE — Progress Notes (Signed)
Chief Complaint:   OBESITY Roberta [name] is here to discuss her progress with her obesity treatment plan along with follow-up of her obesity related diagnoses. Roberta [name] is on the Category 2 Plan and states she is following her eating plan approximately 90-95% of the time. Roberta [name] states she is walking for 1-1/2 miles 3-4 times per week.  Today's visit was #: 67 Starting weight: 292 lbs Starting date: 10/08/2017 Today's weight: 264 lbs Today's date: 04/03/2019 Total lbs lost to date: 28 lbs Total lbs lost since last in-office visit: 1 lb  Interim History: Roberta [name] had 2 dinner dates over the past weekend.  She reports that she is drinking at least around 80 ounces of water per day.  Yogurt and egg for breakfast.  Lunch is salad or sandwich with ham or [place] with mustard and apple.  She is still working toward getting 8 ounces of meat at dinner (getting 6 ounces).  She is getting in a full 3 calories for snacks.  Subjective:   1. Essential hypertension Review: taking medications as instructed, no medication side effects noted, no chest pain on exertion, no dyspnea on exertion, no swelling of ankles.  Blood pressure is well-controlled today.  BP Readings from Last 3 Encounters:  04/03/19 119/76  03/11/19 109/74  02/25/19 103/70   2. Depression with anxiety Symptoms are better controlled with Zoloft.  She finds herself not as bothered by things.  3. At risk for heart disease Roberta [name] is at a higher than average risk for cardiovascular disease due to obesity.   Assessment/Plan:   1. Essential hypertension Roberta [name] is working on healthy weight loss and exercise to improve blood pressure control. We will watch for signs of hypotension as she continues her lifestyle modifications. - losartan-hydrochlorothiazide (HYZAAR) 50-12.5 MG tablet; Take 1 tablet by mouth daily.  Dispense: 30 tablet; Refill: 0  2. Depression with anxiety Behavior modification techniques were discussed today to help Roberta [name] deal  with her emotional/non-hunger eating behaviors.  Orders and follow up as documented in patient record.  - sertraline (ZOLOFT) 50 MG tablet; Take 1 tablet (50 mg total) by mouth daily.  Dispense: 30 tablet; Refill: 0  3. At risk for heart disease Roberta [name] was given approximately 15 minutes of coronary artery disease prevention counseling today. She is 54 y.o. female and has risk factors for heart disease including obesity. We discussed intensive lifestyle modifications today with an emphasis on specific weight loss instructions and strategies.   Repetitive spaced learning was employed today to elicit superior memory formation and behavioral change.  4. Class 3 severe obesity with serious comorbidity and body mass index (BMI) of 40.0 to 44.9 in adult, unspecified obesity type (HCC) Roberta [name] is currently in the action stage of change. As such, her goal is to continue with weight loss efforts. She has agreed to the Category 3 Plan.   Exercise goals: As is.  Behavioral modification strategies: increasing lean protein intake, decreasing eating out, meal planning and cooking strategies, planning for success and decreasing junk food.  Roberta [name] has agreed to follow-up with our clinic in 2 weeks. She was informed of the importance of frequent follow-up visits to maximize her success with intensive lifestyle modifications for her multiple health conditions.   Objective:   Blood pressure 119/76, pulse 82, temperature 97.9 F (36.6 C), temperature source Oral, height 5\' 6"  (1.676 m), weight 264 lb (119.7 kg), SpO2 97 %. Body mass index is 42.61 kg/m.  General: Cooperative, alert, well developed, in no acute distress.  HEENT: Conjunctivae and lids unremarkable. Cardiovascular: Regular rhythm.  Lungs: Normal work of breathing. Neurologic: No focal deficits.   Lab Results  Component Value Date   CREATININE 0.92 01/30/2019   BUN 20 01/30/2019   NA 141 01/30/2019   K 3.5 01/30/2019   CL 104 01/30/2019   CO2  29 01/30/2019   Lab Results  Component Value Date   ALT 15 01/30/2019   AST 18 01/30/2019   ALKPHOS 67 01/30/2019   BILITOT 0.3 01/30/2019   Lab Results  Component Value Date   HGBA1C 6.4 (H) 01/27/2019   HGBA1C 6.0 (H) 09/25/2018   HGBA1C 5.9 (H) 07/17/2018   HGBA1C 6.1 (H) 01/31/2018   HGBA1C 6.0 (H) 10/08/2017   Lab Results  Component Value Date   INSULIN 13.4 01/31/2018   INSULIN 10.6 10/08/2017   Lab Results  Component Value Date   TSH 1.480 10/08/2017   Lab Results  Component Value Date   CHOL 112 10/08/2017   HDL 50 10/08/2017   LDLCALC 48 10/08/2017   TRIG 69 10/08/2017   CHOLHDL 2 06/25/2017   Lab Results  Component Value Date   WBC 5.3 01/30/2019   HGB 11.4 (L) 01/30/2019   HCT 35.8 (L) 01/30/2019   MCV 81.0 01/30/2019   PLT 241 01/30/2019   Lab Results  Component Value Date   IRON 162 (H) 01/22/2017   FERRITIN 14.5 01/22/2017   Attestation Statements:   Reviewed by clinician on day of visit: allergies, medications, problem list, medical history, surgical history, family history, social history, and previous encounter notes.  I, Water quality scientist, CMA, am acting as transcriptionist for Coralie Common, MD.  I have reviewed the above documentation for accuracy and completeness, and I agree with the above. - Ilene Qua, MD

## 2019-04-10 ENCOUNTER — Encounter: Payer: Self-pay | Admitting: Oncology

## 2019-04-14 ENCOUNTER — Telehealth: Payer: Self-pay | Admitting: *Deleted

## 2019-04-14 NOTE — Telephone Encounter (Signed)
Pt came by to p/u letter for work.  This was completed & given to pt.

## 2019-04-24 ENCOUNTER — Other Ambulatory Visit: Payer: Self-pay

## 2019-04-24 ENCOUNTER — Encounter (INDEPENDENT_AMBULATORY_CARE_PROVIDER_SITE_OTHER): Payer: Self-pay | Admitting: Family Medicine

## 2019-04-24 ENCOUNTER — Ambulatory Visit (INDEPENDENT_AMBULATORY_CARE_PROVIDER_SITE_OTHER): Payer: BC Managed Care – PPO | Admitting: Family Medicine

## 2019-04-24 VITALS — BP 115/76 | HR 72 | Temp 97.8°F | Ht 66.0 in | Wt 266.0 lb

## 2019-04-24 DIAGNOSIS — R7303 Prediabetes: Secondary | ICD-10-CM

## 2019-04-24 DIAGNOSIS — Z9189 Other specified personal risk factors, not elsewhere classified: Secondary | ICD-10-CM

## 2019-04-24 DIAGNOSIS — F418 Other specified anxiety disorders: Secondary | ICD-10-CM

## 2019-04-24 DIAGNOSIS — I10 Essential (primary) hypertension: Secondary | ICD-10-CM | POA: Diagnosis not present

## 2019-04-24 DIAGNOSIS — Z6841 Body Mass Index (BMI) 40.0 and over, adult: Secondary | ICD-10-CM

## 2019-04-24 MED ORDER — LOSARTAN POTASSIUM-HCTZ 50-12.5 MG PO TABS: 1.0000 | ORAL_TABLET | Freq: Every day | ORAL | 0 refills | Status: DC

## 2019-04-24 MED ORDER — SERTRALINE HCL 50 MG PO TABS: 50.0000 mg | ORAL_TABLET | Freq: Every day | ORAL | 0 refills | Status: DC

## 2019-04-24 MED ORDER — METFORMIN HCL 500 MG PO TABS: 500.0000 mg | ORAL_TABLET | Freq: Every day | ORAL | 0 refills | Status: DC

## 2019-04-28 NOTE — Progress Notes (Signed)
Chief Complaint:   OBESITY Roberta [name] is here to discuss her progress with her obesity treatment plan along with follow-up of her obesity related diagnoses. Roberta [name] is on the Category 3 Plan and states she is following her eating plan approximately 90-95% of the time. Roberta [name] states she is walking 1.5 miles 2-3 times per week.  Today's visit was #: 104 Starting weight: 292 lbs Starting date: 10/08/2017 Today's weight: 266 lbs Today's date: 04/24/2019 Total lbs lost to date: 26 lbs Total lbs lost since last in-office visit: 0  Interim History: Roberta [name] reports that she has been drinking a lot of cucumber water.  Denies hunger on Category 3.  She is eating at least 8 ounces of meat at dinner.  She reports eating nuts for snacks.  For breakfast, she has Belvita breakfast cookies.  She is eating everything for breakfast and lunch.  She says that her days are longer (8 am-6 pm).  She has minimal cravings, mostly for dark chocolate.  Subjective:   1. Prediabetes Roberta [name] has a diagnosis of prediabetes based on her elevated HgA1c and was informed this puts her at greater risk of developing diabetes. She continues to work on diet and exercise to decrease her risk of diabetes. She denies nausea or hypoglycemia.  She is taking metformin with no GI side effects.  Lab Results  Component Value Date   HGBA1C 6.4 (H) 01/27/2019   Lab Results  Component Value Date   INSULIN 13.4 01/31/2018   INSULIN 10.6 10/08/2017   2. Essential hypertension Review: taking medications as instructed, no medication side effects noted, no chest pain on exertion, no dyspnea on exertion, no swelling of ankles.  Blood pressure is well controlled.  BP Readings from Last 3 Encounters:  04/24/19 115/76  04/03/19 119/76  03/11/19 109/74   3. Depression with anxiety Roberta [name] is struggling with emotional eating and using food for comfort to the extent that it is negatively impacting her health. She has been working on behavior  modification techniques to help reduce her emotional eating and has been successful. She shows no sign of suicidal or homicidal ideations.  4. At risk for heart disease Roberta [name] is at a higher than average risk for cardiovascular disease due to obesity.   Assessment/Plan:   1. Prediabetes Roberta [name] will continue to work on weight loss, exercise, and decreasing simple carbohydrates to help decrease the risk of diabetes.  - metFORMIN (GLUCOPHAGE) 500 MG tablet; Take 1 tablet (500 mg total) by mouth daily with breakfast.  Dispense: 30 tablet; Refill: 0  2. Essential hypertension Roberta [name] is working on healthy weight loss and exercise to improve blood pressure control. We will watch for signs of hypotension as she continues her lifestyle modifications.  If blood pressure is well-controlled at next visit, will split medication and lower losartan to 25 mg. - losartan-hydrochlorothiazide (HYZAAR) 50-12.5 MG tablet; Take 1 tablet by mouth daily.  Dispense: 30 tablet; Refill: 0  3. Depression with anxiety Behavior modification techniques were discussed today to help Roberta [name] deal with her emotional/non-hunger eating behaviors.  Orders and follow up as documented in patient record.  - sertraline (ZOLOFT) 50 MG tablet; Take 1 tablet (50 mg total) by mouth daily.  Dispense: 30 tablet; Refill: 0  4. At risk for heart disease Roberta [name] was given approximately 15 minutes of coronary artery disease prevention counseling today. She is 54 y.o. female and has risk factors for heart disease including obesity. We discussed intensive lifestyle modifications today with an emphasis on  specific weight loss instructions and strategies.   Repetitive spaced learning was employed today to elicit superior memory formation and behavioral change.  5. Class 3 severe obesity with serious comorbidity and body mass index (BMI) of 40.0 to 44.9 in adult, unspecified obesity type (HCC) Roberta [name] is currently in the action stage of change. As such,  her goal is to continue with weight loss efforts. She has agreed to the Category 3 Plan.   Exercise goals: As is.  Behavioral modification strategies: increasing lean protein intake, meal planning and cooking strategies, keeping healthy foods in the home and planning for success.  Roberta [name] has agreed to follow-up with our clinic in 2-3 weeks. She was informed of the importance of frequent follow-up visits to maximize her success with intensive lifestyle modifications for her multiple health conditions.   Objective:   Blood pressure 115/76, pulse 72, temperature 97.8 F (36.6 C), temperature source Oral, height 5\' 6"  (1.676 m), weight 266 lb (120.7 kg), SpO2 100 %. Body mass index is 42.93 kg/m.  General: Cooperative, alert, well developed, in no acute distress. HEENT: Conjunctivae and lids unremarkable. Cardiovascular: Regular rhythm.  Lungs: Normal work of breathing. Neurologic: No focal deficits.   Lab Results  Component Value Date   CREATININE 0.92 01/30/2019   BUN 20 01/30/2019   NA 141 01/30/2019   K 3.5 01/30/2019   CL 104 01/30/2019   CO2 29 01/30/2019   Lab Results  Component Value Date   ALT 15 01/30/2019   AST 18 01/30/2019   ALKPHOS 67 01/30/2019   BILITOT 0.3 01/30/2019   Lab Results  Component Value Date   HGBA1C 6.4 (H) 01/27/2019   HGBA1C 6.0 (H) 09/25/2018   HGBA1C 5.9 (H) 07/17/2018   HGBA1C 6.1 (H) 01/31/2018   HGBA1C 6.0 (H) 10/08/2017   Lab Results  Component Value Date   INSULIN 13.4 01/31/2018   INSULIN 10.6 10/08/2017   Lab Results  Component Value Date   TSH 1.480 10/08/2017   Lab Results  Component Value Date   CHOL 112 10/08/2017   HDL 50 10/08/2017   LDLCALC 48 10/08/2017   TRIG 69 10/08/2017   CHOLHDL 2 06/25/2017   Lab Results  Component Value Date   WBC 5.3 01/30/2019   HGB 11.4 (L) 01/30/2019   HCT 35.8 (L) 01/30/2019   MCV 81.0 01/30/2019   PLT 241 01/30/2019   Lab Results  Component Value Date   IRON 162 (H)  01/22/2017   FERRITIN 14.5 01/22/2017   Attestation Statements:   Reviewed by clinician on day of visit: allergies, medications, problem list, medical history, surgical history, family history, social history, and previous encounter notes.  I, Water quality scientist, CMA, am acting as transcriptionist for Coralie Common, MD.  I have reviewed the above documentation for accuracy and completeness, and I agree with the above. - Ilene Qua, MD

## 2019-04-30 NOTE — Progress Notes (Signed)
SURVIVORSHIP VIRTUAL VISIT:  I connected with Roberta [name] on 04/30/19 at 10:00 AM EDT by my chart video and verified that I am speaking with the correct person using two identifiers.  I discussed the limitations, risks, security and privacy concerns of performing an evaluation and management service by telephone and the availability of in person appointments. I also discussed with the patient that there may be a patient responsible charge related to this service. The patient expressed understanding and agreed to proceed.   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    09/02/2018 Initial Diagnosis   Malignant neoplasm of upper-inner quadrant of left breast in female, estrogen receptor positive ([place])   10/01/2018 Cancer Staging   Staging form: Breast, AJCC 8th Edition - Pathologic stage from 10/01/2018: Stage IA (pT1b, pN0, cM0, G2, ER+, PR+, HER2-, Oncotype DX score: 20)    10/01/2018 Surgery   left mastectomy [facility]) [phone]): pT1b pN0, stage IA invasive ductal carcinoma, grade 2, margins negative. ER+, PR+, HER2-. 5 lymph nodes were negative for carcinoma.   11/2018 - 11/2023 Anti-estrogen oral therapy   Tamoxifen     INTERVAL HISTORY:  Roberta [name] to review her survivorship care plan detailing her treatment course for breast cancer, as well as monitoring long-term side effects of that treatment, education regarding health maintenance, screening, and overall wellness and health promotion.     Overall, Roberta [name] reports feeling quite well.  She is taking Tamoxifen daily and is tolerating  it moderatly well.  She has hot flashes that have increased.  She does have night sweats at night and these do wake her up.  She is taking Gabapentin at bedtime, this was helping with paraesthesias she experienced post surgery.  These have not improved her hot flashes. She did add black cohosh in the morning.  She has some ROM difficulties however cannot afford the copays for PT as they are $115 per visit.    Her survivorship survey was completed and scanned into chart.  She screened positive for distress and was given a QOLCSV    REVIEW OF SYSTEMS:  Review of Systems  Constitutional: Negative for appetite change, chills, fatigue, fever and unexpected weight change.  HENT:   Negative for hearing loss, lump/mass, sore throat and trouble swallowing.   Eyes: Negative for eye problems and icterus.  Respiratory: Negative for chest tightness, cough and shortness of breath.   Cardiovascular: Negative for chest pain, leg swelling and palpitations.  Gastrointestinal: Negative for abdominal distention, abdominal pain, constipation, diarrhea, nausea and vomiting.  Endocrine: Positive for hot flashes.  Genitourinary: Negative for difficulty urinating.   Musculoskeletal: Negative for arthralgias.  Skin: Negative for itching and rash.  Neurological: Positive for numbness (from surgery). Negative for dizziness, extremity weakness and headaches.  Hematological: Negative for adenopathy. Does not bruise/bleed easily.  Psychiatric/Behavioral: Negative for depression. The patient is nervous/anxious.   Breast: Denies any new nodularity, masses, tenderness, nipple changes, or nipple discharge.      ONCOLOGY TREATMENT TEAM:  1. Surgeon:  Dr. Ninfa Linden at [facility] 2. Medical Oncologist: Dr. Lindi [name]     PAST MEDICAL/SURGICAL HISTORY:  Past Medical History:  Diagnosis Date  . Back pain   . Cancer [facility])    multifocal left breast  . Depression   . Hip pain   . Hypertension   . Obesity   .  PONV (postoperative nausea and vomiting)   . Prediabetes   . Swelling    bilat LE  . Wears glasses    Past Surgical History:  Procedure Laterality Date  . CHOLECYSTECTOMY    . COLONOSCOPY    . DILATION AND CURETTAGE OF UTERUS    . GASTRIC BYPASS    . KNEE SURGERY  2002   Ligament repair  . MASTECTOMY W/ SENTINEL NODE BIOPSY Left 10/01/2018   Procedure: LEFT TOTAL MASTECTOMY WITH SENTINEL LYMPH NODE BIOPSY AND BLUE DYE INJECTION;  Surgeon: Fanny Skates, MD;  Location: [facility];  Service: General;  Laterality: Left;     ALLERGIES:  Allergies  Allergen Reactions  . Lisinopril Swelling    Swelling of the tongue  . Penicillins     UNSPECIFIED CHILDHOOD REACTION  . Norvasc [Amlodipine Besylate] Rash  . Red Dye Rash  . Tetanus Toxoids Swelling    Swelling at site of injection     CURRENT MEDICATIONS:  Outpatient Encounter Medications as of 05/01/2019  Medication Sig Note  . gabapentin (NEURONTIN) 300 MG capsule Take 1 capsule (300 mg total) by mouth at bedtime.   Marland Kitchen losartan-hydrochlorothiazide (HYZAAR) 100-25 MG tablet Take 0.5 tablets by mouth daily.   Marland Kitchen losartan-hydrochlorothiazide (HYZAAR) 50-12.5 MG tablet Take 1 tablet by mouth daily.   . metFORMIN (GLUCOPHAGE) 500 MG tablet Take 1 tablet (500 mg total) by mouth daily with breakfast.   . Multiple Vitamin (MULTIVITAMIN) tablet Take 1 tablet by mouth daily.   . mupirocin ointment (BACTROBAN) 2 % Apply 1 application topically 2 (two) times daily as needed.   . Omega-3 Fatty Acids (OMEGA 3 500 PO) Take 500 mg by mouth daily.  09/23/2018: On hold  . sertraline (ZOLOFT) 50 MG tablet Take 1 tablet (50 mg total) by mouth daily.   . tamoxifen (NOLVADEX) 20 MG tablet TAKE 1 TABLET BY MOUTH EVERY DAY    No facility-administered encounter medications on file as of 05/01/2019.     ONCOLOGIC FAMILY HISTORY:  Family History  Problem Relation Age of Onset  . Diabetes Mother   . Hypertension Mother   . Heart disease Mother   . Stroke  Mother   . Kidney disease Mother   . Obesity Mother   . Cancer Maternal Aunt        lung  . Cancer Maternal Uncle        lung  . Throat cancer Maternal Aunt      GENETIC COUNSELING/TESTING: Not at this time  SOCIAL HISTORY:  Social History   Socioeconomic History  . Marital status: Legally Separated    Spouse name: Not on file  . Number of children: Not on file  . Years of education: Not on file  . Highest education level: Not on file  Occupational History  . Not on file  Tobacco Use  . Smoking status: Never Smoker  . Smokeless tobacco: Never Used  Substance and Sexual Activity  . Alcohol use: Yes    Comment: occasional wine  . Drug use: No  . Sexual activity: Not Currently  Other Topics Concern  . Not on file  Social History Narrative  . Not on file   Social Determinants of Health   Financial Resource Strain:   . Difficulty  of Paying Living Expenses:   Food Insecurity:   . Worried About Charity fundraiser in the Last Year:   . Arboriculturist in the Last Year:   Transportation Needs:   . Film/video editor (Medical):   Marland Kitchen Lack of Transportation (Non-Medical):   Physical Activity:   . Days of Exercise per Week:   . Minutes of Exercise per Session:   Stress:   . Feeling of Stress :   Social Connections:   . Frequency of Communication with Friends and Family:   . Frequency of Social Gatherings with Friends and Family:   . Attends Religious Services:   . Active Member of Clubs or Organizations:   . Attends Archivist Meetings:   Marland Kitchen Marital Status:   Intimate Partner Violence:   . Fear of Current or Ex-Partner:   . Emotionally Abused:   Marland Kitchen Physically Abused:   . Sexually Abused:      OBSERVATIONS/OBJECTIVE:  Patient appears well and is in no apparent distress.  Her mood and behavior are normal.  Breathing is non labored.  Speech is normal.  Skin visualized without rash or lesion.  LABORATORY DATA:  None for this visit.  DIAGNOSTIC  IMAGING:  None for this visit.      ASSESSMENT AND PLAN:  Ms.. [name] is a pleasant 54 y.o. female with Stage IA left breast invasive ductal carcinoma, ER+/PR+/HER2-, diagnosed in 08/2018, treated with mastectomy and anti-estrogen therapy with Tamoxifen beginning in 11/2018.  She presents to the Survivorship Clinic for our initial meeting and routine follow-up post-completion of treatment for breast cancer.    1. Stage IA left breast cancer:  Roberta [name] is continuing to recover from definitive treatment for breast cancer. She will follow-up with her medical oncologist, Dr. Jana Hakim in 09/2019 with history and physical exam per surveillance protocol.  She will continue her anti-estrogen therapy with Tamoxifen. Thus far, she is tolerating the Tamoxifen moderately well other than tolerable hot flashes.  She will be due for repeat mammogram on her right breast in August of 2021.  I placed orders for this today. Today, a comprehensive survivorship care plan and treatment summary was reviewed with the patient today detailing her breast cancer diagnosis, treatment course, potential late/long-term effects of treatment, appropriate follow-up care with recommendations for the future, and patient education resources.  A copy of this summary, along with a letter will be sent to the patient's primary care provider via mail/fax/In Basket message after today's visit.    2. Positive distress screen: She has not yet received her care plan and support materials but they have been mailed.  She also completed QOL-CSV which has been sent to our social worker who will f/u with her.    3. Bone health:  She was given information on bone health.  I reviewed that the tamoxifen has a protective effect on the bones.  4. Cancer screening:  Due to Roberta [name]'s history and her age, she should receive screening for skin cancers, colon cancer, and gynecologic cancers.  The information and recommendations are listed on the patient's  comprehensive care plan/treatment summary and were reviewed in detail with the patient.    5. Health maintenance and wellness promotion: Roberta [name] was encouraged to consume 5-7 servings of fruits and vegetables per day. We reviewed the "Nutrition Rainbow" handout, as well as the handout "Take Control of Your Health and Reduce Your Cancer Risk" from the [place].  She was also encouraged to  engage in moderate to vigorous exercise for 30 minutes per day most days of the week. We discussed the [employer] fitness program, which is designed for cancer survivors to help them become more physically fit after cancer treatments.  She was instructed to limit her alcohol consumption and continue to abstain from tobacco use.     6. Support services/counseling: It is not uncommon for this period of the patient's cancer care trajectory to be one of many emotions and stressors.  We discussed how this can be increasingly difficult during the times of quarantine and social distancing due to the COVID-19 pandemic.   She was given information regarding our available services and encouraged to contact me with any questions or for help enrolling in any of our support group/programs.    Follow up instructions:    -Return to cancer center for f/u with Dr. Jana Hakim in 09/2019  -Mammogram in 08/2019 -Follow up with Dr. Ninfa Linden 12/2019 -F/u with me in 03/2020  The patient was provided an opportunity to ask questions and all were answered. The patient agreed with the plan and demonstrated an understanding of the instructions.   Total encounter time: 45 minutes*  Wilber Bihari, NP 05/01/19 10:38 AM Medical Oncology and Hematology [facility] [place], [place] [address] Tel. [phone]    Fax. [phone]  *Total Encounter Time as defined by the Centers for Medicare and Medicaid Services includes, in addition to the face-to-face time of a patient visit (documented in the  note above) non-face-to-face time: obtaining and reviewing outside history, ordering and reviewing medications, tests or procedures, care coordination (communications with other health care professionals or caregivers) and documentation in the medical record.

## 2019-05-01 ENCOUNTER — Inpatient Hospital Stay: Payer: BC Managed Care – PPO | Attending: Adult Health | Admitting: Adult Health

## 2019-05-01 ENCOUNTER — Telehealth: Payer: BC Managed Care – PPO | Admitting: Adult Health

## 2019-05-01 DIAGNOSIS — Z170 Estrogen receptor positive status [ER+]: Secondary | ICD-10-CM | POA: Diagnosis not present

## 2019-05-01 DIAGNOSIS — C50212 Malignant neoplasm of upper-inner quadrant of left female breast: Secondary | ICD-10-CM

## 2019-05-05 ENCOUNTER — Encounter: Payer: Self-pay | Admitting: Licensed Clinical Social Worker

## 2019-05-05 NOTE — Progress Notes (Signed)
[facility] Quality of Life Screening Clinical Social Work  Clinical Social Work was referred by survivorship quality of life screening protocol.  The patient scored a 47.48 on the Quality of Life/ Cancer Survivor (QOL-CS) scale which indicates high quality of life.   Clinical Social Worker contacted patient by phone to assess for needs. Based on screener and patient report, self-esteem related to appearance changes and sexuality are concerns. Patient has concerns about some physical pieces (gas that may be related to metformin?, and having increased body odor). Patient is also dealing with the death of a cousin currently.    CSW provided supportive listening, provided resources related to intimacy and sexual health, confirmed that patient is attending breast cancer support group, and is signed up for [facility]. Also discussed [employer] grant if needing assistance with affording PT co-pay. Patient declined at this time.  CSW will pass physical health questions on to medical team.     Follow up plan: 1. Patient to continue following medical plan to work towards reconstruction. 2. Patient to attend [place] group 3. Follow-up QOL screen to be sent after [facility].     Deetya Drouillard E, LCSW

## 2019-05-08 ENCOUNTER — Telehealth: Payer: Self-pay | Admitting: *Deleted

## 2019-05-08 NOTE — Telephone Encounter (Signed)
LM with note below 

## 2019-05-08 NOTE — Telephone Encounter (Signed)
-----   Message from Gardenia Phlegm, NP sent at 05/05/2019  2:10 PM EDT ----- Regarding: RE: Increase water intake, lime deodorant is fine, walking and a probiotic will help gaminess ----- Message ----- From: Paulo Fruit, LCSW Sent: 05/05/2019  12:40 PM EDT To: Gardenia Phlegm, NP  Hi, patient had questions during our conversation today about any recommendations re: increased body odor (she feels due to hormonal changes). She said she is looking into Lume deodorant, but wanted to know other suggestions. She also is having significantly increased gassiness and wanted to know if there is anything she can do for that.

## 2019-05-15 ENCOUNTER — Ambulatory Visit (INDEPENDENT_AMBULATORY_CARE_PROVIDER_SITE_OTHER): Payer: BC Managed Care – PPO | Admitting: Family Medicine

## 2019-05-15 ENCOUNTER — Other Ambulatory Visit: Payer: Self-pay

## 2019-05-15 ENCOUNTER — Encounter (INDEPENDENT_AMBULATORY_CARE_PROVIDER_SITE_OTHER): Payer: Self-pay | Admitting: Family Medicine

## 2019-05-15 VITALS — BP 118/77 | HR 64 | Temp 98.0°F | Ht 66.0 in | Wt 271.0 lb

## 2019-05-15 DIAGNOSIS — Z6841 Body Mass Index (BMI) 40.0 and over, adult: Secondary | ICD-10-CM | POA: Diagnosis not present

## 2019-05-15 DIAGNOSIS — R7303 Prediabetes: Secondary | ICD-10-CM

## 2019-05-15 DIAGNOSIS — I10 Essential (primary) hypertension: Secondary | ICD-10-CM | POA: Diagnosis not present

## 2019-05-19 NOTE — Progress Notes (Signed)
Chief Complaint:   OBESITY Roberta [name] is here to discuss her progress with her obesity treatment plan along with follow-up of her obesity related diagnoses. Roberta [name] is on the Category 3 Plan and states she is following her eating plan approximately 80% of the time. Roberta [name] states she is exercising for 0 minutes 0 times per week.  Today's visit was #: 62 Starting weight: 292 lbs Starting date: 10/08/2017 Today's weight: 271 lbs Today's date: 05/15/2019 Total lbs lost to date: 21 lbs Total lbs lost since last in-office visit: 0  Interim History: Roberta [name] says that exercise was a big factor in her weight loss.  She plans to get back to the gym in 2 days.  She is planning to go to the gym 3 times a week.  She reports that she is finally getting in 8-10 ounces of meat at night.  She has food at home.  Her snack calories come from Nabs or breakfast cookies or Ghiradelli dark chocolate squares.  She is waiting for the end of school to be able to really focus on herself.  Subjective:   1. Prediabetes Roberta [name] has a diagnosis of prediabetes based on her elevated HgA1c and was informed this puts her at greater risk of developing diabetes. She continues to work on diet and exercise to decrease her risk of diabetes. She denies nausea or hypoglycemia.  She has occasional cravings for salty or sweet.  Taking metformin daily.  Lab Results  Component Value Date   HGBA1C 6.4 (H) 01/27/2019   Lab Results  Component Value Date   INSULIN 13.4 01/31/2018   INSULIN 10.6 10/08/2017   2. Essential hypertension Review: taking medications as instructed, no medication side effects noted, no chest pain on exertion, no dyspnea on exertion, no swelling of ankles.  Blood pressure is controlled today.  Roberta [name] is taking Hyzaar.  BP Readings from Last 3 Encounters:  05/15/19 118/77  04/24/19 115/76  04/03/19 119/76   Assessment/Plan:   1. Prediabetes Roberta [name] will continue to work on weight loss, exercise, and decreasing  simple carbohydrates to help decrease the risk of diabetes.   2. Essential hypertension Roberta [name] is working on healthy weight loss and exercise to improve blood pressure control. We will watch for signs of hypotension as she continues her lifestyle modifications.  3. Class 3 severe obesity with serious comorbidity and body mass index (BMI) of 40.0 to 44.9 in adult, unspecified obesity type (HCC) Roberta [name] is currently in the action stage of change. As such, her goal is to continue with weight loss efforts. She has agreed to the Category 3 Plan.   Exercise goals: All adults should avoid inactivity. Some physical activity is better than none, and adults who participate in any amount of physical activity gain some health benefits.  Behavioral modification strategies: increasing lean protein intake, increasing vegetables, meal planning and cooking strategies, keeping healthy foods in the home and planning for success.  Roberta [name] will start going back to the gym.  Roberta [name] has agreed to follow-up with our clinic in 2 weeks. She was informed of the importance of frequent follow-up visits to maximize her success with intensive lifestyle modifications for her multiple health conditions.   Objective:   Blood pressure 118/77, pulse 64, temperature 98 F (36.7 C), temperature source Oral, height 5\' 6"  (1.676 m), weight 271 lb (122.9 kg), SpO2 98 %. Body mass index is 43.74 kg/m.  General: Cooperative, alert, well developed, in no acute distress. HEENT: Conjunctivae and lids unremarkable. Cardiovascular: Regular  rhythm.  Lungs: Normal work of breathing. Neurologic: No focal deficits.   Lab Results  Component Value Date   CREATININE 0.92 01/30/2019   BUN 20 01/30/2019   NA 141 01/30/2019   K 3.5 01/30/2019   CL 104 01/30/2019   CO2 29 01/30/2019   Lab Results  Component Value Date   ALT 15 01/30/2019   AST 18 01/30/2019   ALKPHOS 67 01/30/2019   BILITOT 0.3 01/30/2019   Lab Results  Component Value  Date   HGBA1C 6.4 (H) 01/27/2019   HGBA1C 6.0 (H) 09/25/2018   HGBA1C 5.9 (H) 07/17/2018   HGBA1C 6.1 (H) 01/31/2018   HGBA1C 6.0 (H) 10/08/2017   Lab Results  Component Value Date   INSULIN 13.4 01/31/2018   INSULIN 10.6 10/08/2017   Lab Results  Component Value Date   TSH 1.480 10/08/2017   Lab Results  Component Value Date   CHOL 112 10/08/2017   HDL 50 10/08/2017   LDLCALC 48 10/08/2017   TRIG 69 10/08/2017   CHOLHDL 2 06/25/2017   Lab Results  Component Value Date   WBC 5.3 01/30/2019   HGB 11.4 (L) 01/30/2019   HCT 35.8 (L) 01/30/2019   MCV 81.0 01/30/2019   PLT 241 01/30/2019   Lab Results  Component Value Date   IRON 162 (H) 01/22/2017   FERRITIN 14.5 01/22/2017   Attestation Statements:   Reviewed by clinician on day of visit: allergies, medications, problem list, medical history, surgical history, family history, social history, and previous encounter notes.  Time spent on visit including pre-visit chart review and post-visit care and charting was 15 minutes.   I, Water quality scientist, CMA, am acting as transcriptionist for Coralie Common, MD.  I have reviewed the above documentation for accuracy and completeness, and I agree with the above. - Jinny Blossom, MD

## 2019-06-05 ENCOUNTER — Ambulatory Visit (INDEPENDENT_AMBULATORY_CARE_PROVIDER_SITE_OTHER): Payer: BC Managed Care – PPO | Admitting: Family Medicine

## 2019-06-05 ENCOUNTER — Other Ambulatory Visit: Payer: Self-pay

## 2019-06-05 ENCOUNTER — Encounter (INDEPENDENT_AMBULATORY_CARE_PROVIDER_SITE_OTHER): Payer: Self-pay | Admitting: Family Medicine

## 2019-06-05 VITALS — BP 109/76 | HR 66 | Temp 98.0°F | Ht 66.0 in | Wt 271.0 lb

## 2019-06-05 DIAGNOSIS — I10 Essential (primary) hypertension: Secondary | ICD-10-CM

## 2019-06-05 DIAGNOSIS — Z9189 Other specified personal risk factors, not elsewhere classified: Secondary | ICD-10-CM

## 2019-06-05 DIAGNOSIS — F418 Other specified anxiety disorders: Secondary | ICD-10-CM | POA: Diagnosis not present

## 2019-06-05 DIAGNOSIS — Z6841 Body Mass Index (BMI) 40.0 and over, adult: Secondary | ICD-10-CM

## 2019-06-05 DIAGNOSIS — R7303 Prediabetes: Secondary | ICD-10-CM | POA: Diagnosis not present

## 2019-06-05 MED ORDER — HYDROCHLOROTHIAZIDE 12.5 MG PO TABS: 12.5000 mg | ORAL_TABLET | Freq: Every day | ORAL | 0 refills | Status: DC

## 2019-06-05 MED ORDER — METFORMIN HCL 500 MG PO TABS: 500.0000 mg | ORAL_TABLET | Freq: Every day | ORAL | 0 refills | Status: DC

## 2019-06-05 MED ORDER — SERTRALINE HCL 50 MG PO TABS: 50.0000 mg | ORAL_TABLET | Freq: Every day | ORAL | 0 refills | Status: DC

## 2019-06-05 MED ORDER — LOSARTAN POTASSIUM 25 MG PO TABS: 25.0000 mg | ORAL_TABLET | Freq: Every day | ORAL | 0 refills | Status: DC

## 2019-06-05 NOTE — Progress Notes (Signed)
Chief Complaint:   OBESITY Roberta [name] is here to discuss her progress with her obesity treatment plan along with follow-up of her obesity related diagnoses. Roberta [name] is on the Category 3 Plan and states she is following her eating plan approximately 90% of the time. Roberta [name] states she is working with her trainer for 60 minutes 1 times per week.  Today's visit was #: 68 Starting weight: 292 lbs Starting date: 10/08/2017 Today's weight: 271 lbs Today's date: 06/05/2019 Total lbs lost to date: 21 lbs Total lbs lost since last in-office visit: 0  Interim History: Roberta [name] recommitted to the gym for the rest of the year.  She says she got one free personal training session and was very sore afterwards.  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  In 2 weeks, she is going to [facility].   Subjective:   1. Essential hypertension Review: taking medications as instructed, no medication side effects noted, no chest pain on exertion, no dyspnea on exertion, no swelling of ankles. Blood pressure is well-controlled.  She is taking Hyzaar 50-12.5 mg daily.  BP Readings from Last 3 Encounters:  06/05/19 109/76  05/15/19 118/77  04/24/19 115/76   2. Prediabetes Elveta has a diagnosis of prediabetes based on her elevated HgA1c and was informed this puts her at greater risk of developing diabetes. She continues to work on diet and exercise to decrease her risk of diabetes. She denies nausea or hypoglycemia.  She is taking metformin, but ran out last week.  Lab Results  Component Value Date   HGBA1C 6.4 (H) 01/27/2019   Lab Results  Component Value Date   INSULIN 13.4 01/31/2018   INSULIN 10.6 10/08/2017   3. Depression with anxiety Dulcey denies SI/HI.  She says her symptoms are well-controlled on sertraline.  4. At risk for osteoporosis Ramiro is at higher risk of osteopenia and osteoporosis due to Vitamin D deficiency.   Assessment/Plan:     1. Essential hypertension Roberta [name] is working on healthy weight loss and exercise to improve blood pressure control. We will watch for signs of hypotension as she continues her lifestyle modifications.  Roberta [name] will stop Hyzaar and start losartan 25 mg daily and HCTZ 12.5 mg daily. - hydrochlorothiazide (HYDRODIURIL) 12.5 MG tablet; Take 1 tablet (12.5 mg total) by mouth daily.  Dispense: 30 tablet; Refill: 0 - losartan (COZAAR) 25 MG tablet; Take 1 tablet (25 mg total) by mouth daily.  Dispense: 30 tablet; Refill: 0  2. Prediabetes Roberta [name] will continue to work on weight loss, exercise, and decreasing simple carbohydrates to help decrease the risk of diabetes.  - metFORMIN (GLUCOPHAGE) 500 MG tablet; Take 1 tablet (500 mg total) by mouth daily with breakfast.  Dispense: 30 tablet; Refill: 0  3. Depression with anxiety Behavior modification techniques were discussed today to help Tiffny deal with her emotional/non-hunger eating behaviors.  Orders and follow up as documented in patient record.  - sertraline (ZOLOFT) 50 MG tablet; Take 1 tablet (50 mg total) by mouth daily.  Dispense: 30 tablet; Refill: 0  4. At risk for osteoporosis Roberta [name] was given approximately 15 minutes of osteoporosis prevention counseling today. Roberta [name] is at risk for osteopenia and osteoporosis due to her Vitamin D deficiency. She was encouraged to take her Vitamin D and follow her higher calcium diet and increase strengthening exercise to help strengthen her bones and decrease her risk of osteopenia and osteoporosis.  Repetitive spaced  learning was employed today to elicit superior memory formation and behavioral change.  5. Class 3 severe obesity with serious comorbidity and body mass index (BMI) of 40.0 to 44.9 in adult, unspecified obesity type (HCC) Roberta [name] is currently in the action stage of change. As such, her goal is to continue with weight loss efforts. She has agreed to the Category 3 Plan.   Exercise goals: Go to the  gym 3 times per week.  Behavioral modification strategies: increasing lean protein intake, increasing vegetables, meal planning and cooking strategies and keeping healthy foods in the home.  Jameka has agreed to follow-up with our clinic in 2.5 weeks. She was informed of the importance of frequent follow-up visits to maximize her success with intensive lifestyle modifications for her multiple health conditions.   Objective:   Blood pressure 109/76, pulse 66, temperature 98 F (36.7 C), temperature source Oral, height 5\' 6"  (1.676 m), weight 271 lb (122.9 kg), SpO2 99 %. Body mass index is 43.74 kg/m.  General: Cooperative, alert, well developed, in no acute distress. HEENT: Conjunctivae and lids unremarkable. Cardiovascular: Regular rhythm.  Lungs: Normal work of breathing. Neurologic: No focal deficits.   Lab Results  Component Value Date   CREATININE 0.92 01/30/2019   BUN 20 01/30/2019   NA 141 01/30/2019   K 3.5 01/30/2019   CL 104 01/30/2019   CO2 29 01/30/2019   Lab Results  Component Value Date   ALT 15 01/30/2019   AST 18 01/30/2019   ALKPHOS 67 01/30/2019   BILITOT 0.3 01/30/2019   Lab Results  Component Value Date   HGBA1C 6.4 (H) 01/27/2019   HGBA1C 6.0 (H) 09/25/2018   HGBA1C 5.9 (H) 07/17/2018   HGBA1C 6.1 (H) 01/31/2018   HGBA1C 6.0 (H) 10/08/2017   Lab Results  Component Value Date   INSULIN 13.4 01/31/2018   INSULIN 10.6 10/08/2017   Lab Results  Component Value Date   TSH 1.480 10/08/2017   Lab Results  Component Value Date   CHOL 112 10/08/2017   HDL 50 10/08/2017   LDLCALC 48 10/08/2017   TRIG 69 10/08/2017   CHOLHDL 2 06/25/2017   Lab Results  Component Value Date   WBC 5.3 01/30/2019   HGB 11.4 (L) 01/30/2019   HCT 35.8 (L) 01/30/2019   MCV 81.0 01/30/2019   PLT 241 01/30/2019   Lab Results  Component Value Date   IRON 162 (H) 01/22/2017   FERRITIN 14.5 01/22/2017   Attestation Statements:   Reviewed by clinician on day of  visit: allergies, medications, problem list, medical history, surgical history, family history, social history, and previous encounter notes.  I, Water quality scientist, CMA, am acting as transcriptionist for Coralie Common, MD.  I have reviewed the above documentation for accuracy and completeness, and I agree with the above. - Jinny Blossom, MD

## 2019-06-24 ENCOUNTER — Ambulatory Visit (INDEPENDENT_AMBULATORY_CARE_PROVIDER_SITE_OTHER): Payer: BC Managed Care – PPO | Admitting: Family Medicine

## 2019-06-30 ENCOUNTER — Encounter: Payer: Self-pay | Admitting: Licensed Clinical Social Worker

## 2019-06-30 NOTE — Progress Notes (Signed)
[facility] Quality of Life Screening Clinical Social Work  Clinical Social Work received follow-up Quality of Life Screener (QOL-CS) back from patient.   Patient scored a 38.36 which indicates moderate quality of life. This is 9.12 lower than initial score indicating an decreased quality of life. Patient had not been able to attend [facility] as had been planned during last conversation. She is planning to attend in the fall. She is also dealing with other mental and physical health issues for which she is working with her doctor.   Patient can continue to access support services, including support groups and resource referrals, as needed.     Ahyana Skillin E, LCSW

## 2019-07-02 ENCOUNTER — Encounter (INDEPENDENT_AMBULATORY_CARE_PROVIDER_SITE_OTHER): Payer: Self-pay | Admitting: Family Medicine

## 2019-07-02 ENCOUNTER — Ambulatory Visit (INDEPENDENT_AMBULATORY_CARE_PROVIDER_SITE_OTHER): Payer: BC Managed Care – PPO | Admitting: Family Medicine

## 2019-07-02 ENCOUNTER — Other Ambulatory Visit: Payer: Self-pay

## 2019-07-02 VITALS — BP 109/71 | HR 71 | Temp 98.4°F | Ht 66.0 in | Wt 269.0 lb

## 2019-07-02 DIAGNOSIS — Z9189 Other specified personal risk factors, not elsewhere classified: Secondary | ICD-10-CM

## 2019-07-02 DIAGNOSIS — F3289 Other specified depressive episodes: Secondary | ICD-10-CM

## 2019-07-02 DIAGNOSIS — Z6841 Body Mass Index (BMI) 40.0 and over, adult: Secondary | ICD-10-CM

## 2019-07-02 DIAGNOSIS — I10 Essential (primary) hypertension: Secondary | ICD-10-CM

## 2019-07-02 DIAGNOSIS — R7303 Prediabetes: Secondary | ICD-10-CM | POA: Diagnosis not present

## 2019-07-02 MED ORDER — HYDROCHLOROTHIAZIDE 12.5 MG PO TABS: 12.5000 mg | ORAL_TABLET | Freq: Every day | ORAL | 0 refills | Status: DC

## 2019-07-02 MED ORDER — SERTRALINE HCL 50 MG PO TABS: 50.0000 mg | ORAL_TABLET | Freq: Every day | ORAL | 0 refills | Status: DC

## 2019-07-02 MED ORDER — LOSARTAN POTASSIUM 25 MG PO TABS: 12.5000 mg | ORAL_TABLET | Freq: Every day | ORAL | 0 refills | Status: DC

## 2019-07-02 MED ORDER — METFORMIN HCL 500 MG PO TABS: 500.0000 mg | ORAL_TABLET | Freq: Every day | ORAL | 0 refills | Status: DC

## 2019-07-03 NOTE — Progress Notes (Signed)
Chief Complaint:   OBESITY Trisa is here to discuss her progress with her obesity treatment plan along with follow-up of her obesity related diagnoses. Vibha is on the Category 3 Plan.  Asra states she is going to the gym for 30-60 minutes 3 times per week.  Today's visit was #: 55 Starting weight: 292 lbs Starting date: 10/08/2017 Today's weight: 269 lbs Today's date: 07/02/2019 Total lbs lost to date: 23 lbs Total lbs lost since last in-office visit: 2 lbs  Interim History: Nerissa says she finds herself eating less and skipping most of lunch due to it being in the heat of the day.  She may eat an apple, cheese stick, glass of milk.  Often getting in 8-8.5 ounces of meat.  Her family is visiting for the 4th of July.  She wants to do a protein shake for a lunch substitute as she is not eating much protein at lunch.  Subjective:   1. Essential hypertension Review: taking medications as instructed, no medication side effects noted, no chest pain on exertion, no dyspnea on exertion, no swelling of ankles.  Blood pressure is well-controlled, slightly low today.  BP Readings from Last 3 Encounters:  07/02/19 109/71  06/05/19 109/76  05/15/19 118/77   2. Prediabetes Rory has a diagnosis of prediabetes based on her elevated HgA1c and was informed this puts her at greater risk of developing diabetes. She continues to work on diet and exercise to decrease her risk of diabetes. She denies nausea or hypoglycemia.  She is on metformin with no GI side effects.  She endorses occasional carb cravings.  Lab Results  Component Value Date   HGBA1C 6.4 (H) 01/27/2019   Lab Results  Component Value Date   INSULIN 13.4 01/31/2018   INSULIN 10.6 10/08/2017   3. Other depression, with emotional eating  She is taking sertraline with improvement in symptoms.  No SI/HI.  4. At risk for diabetes mellitus Oreatha is at higher than average risk for developing diabetes due to her obesity.    Assessment/Plan:   1. Essential hypertension Kenzi is working on healthy weight loss and exercise to improve blood pressure control. We will watch for signs of hypotension as she continues her lifestyle modifications.  Will refill HCTZ today and change losartan to 12.5 mg daily. - hydrochlorothiazide (HYDRODIURIL) 12.5 MG tablet; Take 1 tablet (12.5 mg total) by mouth daily.  Dispense: 30 tablet; Refill: 0 - losartan (COZAAR) 25 MG tablet; Take 0.5 tablets (12.5 mg total) by mouth daily.  Dispense: 30 tablet; Refill: 0  2. Prediabetes Allannah will continue to work on weight loss, exercise, and decreasing simple carbohydrates to help decrease the risk of diabetes.  Will refill metformin today, as per below. - metFORMIN (GLUCOPHAGE) 500 MG tablet; Take 1 tablet (500 mg total) by mouth daily with breakfast.  Dispense: 30 tablet; Refill: 0  3. Other depression, with emotional eating  Continue sertraline.  Will refill today, as per below. - sertraline (ZOLOFT) 50 MG tablet; Take 1 tablet (50 mg total) by mouth daily.  Dispense: 30 tablet; Refill: 0  4. At risk for diabetes mellitus Telecia was given approximately 15 minutes of diabetes education and counseling today. We discussed intensive lifestyle modifications today with an emphasis on weight loss as well as increasing exercise and decreasing simple carbohydrates in her diet. We also reviewed medication options with an emphasis on risk versus benefit of those discussed.   Repetitive spaced learning was employed today to elicit  superior memory formation and behavioral change.  5. Class 3 severe obesity with serious comorbidity and body mass index (BMI) of 40.0 to 44.9 in adult, unspecified obesity type (HCC) Alletta is currently in the action stage of change. As such, her goal is to continue with weight loss efforts. She has agreed to the Category 3 Plan.   Exercise goals: As is.  Behavioral modification strategies: increasing lean protein  intake, increasing vegetables, meal planning and cooking strategies, keeping healthy foods in the home and planning for success.  Sheree has agreed to follow-up with our clinic in 2-3 weeks. She was informed of the importance of frequent follow-up visits to maximize her success with intensive lifestyle modifications for her multiple health conditions.   Objective:   Blood pressure 109/71, pulse 71, temperature 98.4 F (36.9 C), temperature source Oral, height 5\' 6"  (1.676 m), weight 269 lb (122 kg), SpO2 98 %. Body mass index is 43.42 kg/m.  General: Cooperative, alert, well developed, in no acute distress. HEENT: Conjunctivae and lids unremarkable. Cardiovascular: Regular rhythm.  Lungs: Normal work of breathing. Neurologic: No focal deficits.   Lab Results  Component Value Date   CREATININE 0.92 01/30/2019   BUN 20 01/30/2019   NA 141 01/30/2019   K 3.5 01/30/2019   CL 104 01/30/2019   CO2 29 01/30/2019   Lab Results  Component Value Date   ALT 15 01/30/2019   AST 18 01/30/2019   ALKPHOS 67 01/30/2019   BILITOT 0.3 01/30/2019   Lab Results  Component Value Date   HGBA1C 6.4 (H) 01/27/2019   HGBA1C 6.0 (H) 09/25/2018   HGBA1C 5.9 (H) 07/17/2018   HGBA1C 6.1 (H) 01/31/2018   HGBA1C 6.0 (H) 10/08/2017   Lab Results  Component Value Date   INSULIN 13.4 01/31/2018   INSULIN 10.6 10/08/2017   Lab Results  Component Value Date   TSH 1.480 10/08/2017   Lab Results  Component Value Date   CHOL 112 10/08/2017   HDL 50 10/08/2017   LDLCALC 48 10/08/2017   TRIG 69 10/08/2017   CHOLHDL 2 06/25/2017   Lab Results  Component Value Date   WBC 5.3 01/30/2019   HGB 11.4 (L) 01/30/2019   HCT 35.8 (L) 01/30/2019   MCV 81.0 01/30/2019   PLT 241 01/30/2019   Lab Results  Component Value Date   IRON 162 (H) 01/22/2017   FERRITIN 14.5 01/22/2017   Attestation Statements:   Reviewed by clinician on day of visit: allergies, medications, problem list, medical history,  surgical history, family history, social history, and previous encounter notes.  I, Water quality scientist, CMA, am acting as transcriptionist for Coralie Common, MD.  I have reviewed the above documentation for accuracy and completeness, and I agree with the above. - Jinny Blossom, MD

## 2019-07-17 ENCOUNTER — Other Ambulatory Visit: Payer: Self-pay

## 2019-07-17 ENCOUNTER — Encounter (INDEPENDENT_AMBULATORY_CARE_PROVIDER_SITE_OTHER): Payer: Self-pay | Admitting: Family Medicine

## 2019-07-17 ENCOUNTER — Ambulatory Visit (INDEPENDENT_AMBULATORY_CARE_PROVIDER_SITE_OTHER): Payer: BC Managed Care – PPO | Admitting: Family Medicine

## 2019-07-17 VITALS — BP 114/76 | HR 63 | Temp 98.0°F | Ht 66.0 in | Wt 271.0 lb

## 2019-07-17 DIAGNOSIS — I10 Essential (primary) hypertension: Secondary | ICD-10-CM | POA: Diagnosis not present

## 2019-07-17 DIAGNOSIS — F418 Other specified anxiety disorders: Secondary | ICD-10-CM

## 2019-07-17 DIAGNOSIS — R7303 Prediabetes: Secondary | ICD-10-CM

## 2019-07-17 DIAGNOSIS — Z9189 Other specified personal risk factors, not elsewhere classified: Secondary | ICD-10-CM | POA: Diagnosis not present

## 2019-07-17 DIAGNOSIS — Z6841 Body Mass Index (BMI) 40.0 and over, adult: Secondary | ICD-10-CM

## 2019-07-17 MED ORDER — HYDROCHLOROTHIAZIDE 12.5 MG PO TABS: 12.5000 mg | ORAL_TABLET | Freq: Every day | ORAL | 0 refills | Status: DC

## 2019-07-17 MED ORDER — SERTRALINE HCL 50 MG PO TABS: 50.0000 mg | ORAL_TABLET | Freq: Every day | ORAL | 0 refills | Status: DC

## 2019-07-17 MED ORDER — WEGOVY 0.25 MG/0.5ML ~~LOC~~ SOAJ: 0.2500 mg | SUBCUTANEOUS | 0 refills | Status: DC

## 2019-07-22 NOTE — Progress Notes (Signed)
Chief Complaint:   OBESITY Roberta [name] is here to discuss her progress with her obesity treatment plan along with follow-up of her obesity related diagnoses. Roberta [name] is on the Category 3 Plan and states she is following her eating plan approximately 85% of the time. Roberta [name] states she is at the gym, walking and running, and weights for 30-60 minutes 3 times per week.  Today's visit was #: 79 Starting weight: 292 lbs Starting date: 10/08/2017 Today's weight: 271 lbs Today's date: 07/17/2019 Total lbs lost to date: 21 Total lbs lost since last in-office visit: 0  Interim History: Roberta [name] feels she has been eating more beef recently. She has not been eating all of the protein on the plan. She has been working out more. This weekend she is headed to [place] for her brother's birthday. She voices she feels she is tried of being single and occasionally feeling down. Her mammogram is next week.  Subjective:   1. Pre-diabetes Roberta [name]'s last A1c was 6.4 and insulin 13.4. She denies GI side effects of metformin.  2. Essential hypertension Roberta [name]'s blood pressure is well controlled. She denies chest pain, chest pressure, headaches, or edema.  3. Depression with anxiety Roberta [name] denies suicidal ideas or homicidal ideas. Her symptoms are well controlled with medication.  4. At risk for heart disease Roberta [name] is at a higher than average risk for cardiovascular disease due to obesity.   Assessment/Plan:   1. Pre-diabetes Roberta [name] will continue to work on weight loss, exercise, and decreasing simple carbohydrates to help decrease the risk of diabetes. Roberta [name] agreed to discontinue metformin, and we will follow up on labs at her next appointment.  2. Essential hypertension Roberta [name] is working on healthy weight loss and exercise to improve blood pressure control. We will watch for signs of hypotension as she continues her lifestyle modifications. Roberta [name] will continue her current medications at the same dose. We will refill  hydrochlorothiazide for 1 month.  - hydrochlorothiazide (HYDRODIURIL) 12.5 MG tablet; Take 1 tablet (12.5 mg total) by mouth daily.  Dispense: 30 tablet; Refill: 0  3. Depression with anxiety Behavior modification techniques were discussed today to help Roberta [name] deal with her emotional/non-hunger eating behaviors. We will refill Zoloft for 1 month. Orders and follow up as documented in patient record.   - sertraline (ZOLOFT) 50 MG tablet; Take 1 tablet (50 mg total) by mouth daily.  Dispense: 30 tablet; Refill: 0  4. At risk for heart disease Roberta [name] was given approximately 15 minutes of coronary artery disease prevention counseling today. She is 54 y.o. female and has risk factors for heart disease including obesity. We discussed intensive lifestyle modifications today with an emphasis on specific weight loss instructions and strategies.   Repetitive spaced learning was employed today to elicit superior memory formation and behavioral change.  5. Class 3 severe obesity with serious comorbidity and body mass index (BMI) of 40.0 to 44.9 in adult, unspecified obesity type (HCC) Roberta [name] is currently in the action stage of change. As such, her goal is to continue with weight loss efforts. She has agreed to the Category 3 Plan with 6 oz of meat.   We discussed various medication options to help Roberta [name] with her weight loss efforts and we both agreed to start Wegovy 0.25 mg SubQ weekly with no refills, and pen needles #100 with no refills.  - Semaglutide-Weight Management (WEGOVY) 0.25 MG/0.5ML SOAJ; Inject 0.25 mg into the skin once a week.  Dispense: 2.24 mL; Refill: 0  Exercise goals: As  is.  Behavioral modification strategies: increasing lean protein intake, meal planning and cooking strategies, keeping healthy foods in the home and planning for success.  Roberta [name] has agreed to follow-up with our clinic in 3 weeks. She was informed of the importance of frequent follow-up visits to maximize her success  with intensive lifestyle modifications for her multiple health conditions.   Objective:   Blood pressure 114/76, pulse 63, temperature 98 F (36.7 C), temperature source Oral, height 5\' 6"  (1.676 m), weight 271 lb (122.9 kg), SpO2 100 %. Body mass index is 43.74 kg/m.  General: Cooperative, alert, well developed, in no acute distress. HEENT: Conjunctivae and lids unremarkable. Cardiovascular: Regular rhythm.  Lungs: Normal work of breathing. Neurologic: No focal deficits.   Lab Results  Component Value Date   CREATININE 0.92 01/30/2019   BUN 20 01/30/2019   NA 141 01/30/2019   K 3.5 01/30/2019   CL 104 01/30/2019   CO2 29 01/30/2019   Lab Results  Component Value Date   ALT 15 01/30/2019   AST 18 01/30/2019   ALKPHOS 67 01/30/2019   BILITOT 0.3 01/30/2019   Lab Results  Component Value Date   HGBA1C 6.4 (H) 01/27/2019   HGBA1C 6.0 (H) 09/25/2018   HGBA1C 5.9 (H) 07/17/2018   HGBA1C 6.1 (H) 01/31/2018   HGBA1C 6.0 (H) 10/08/2017   Lab Results  Component Value Date   INSULIN 13.4 01/31/2018   INSULIN 10.6 10/08/2017   Lab Results  Component Value Date   TSH 1.480 10/08/2017   Lab Results  Component Value Date   CHOL 112 10/08/2017   HDL 50 10/08/2017   LDLCALC 48 10/08/2017   TRIG 69 10/08/2017   CHOLHDL 2 06/25/2017   Lab Results  Component Value Date   WBC 5.3 01/30/2019   HGB 11.4 (L) 01/30/2019   HCT 35.8 (L) 01/30/2019   MCV 81.0 01/30/2019   PLT 241 01/30/2019   Lab Results  Component Value Date   IRON 162 (H) 01/22/2017   FERRITIN 14.5 01/22/2017   Attestation Statements:   Reviewed by clinician on day of visit: allergies, medications, problem list, medical history, surgical history, family history, social history, and previous encounter notes.   I, Trixie Dredge, am acting as transcriptionist for Coralie Common, MD.  I have reviewed the above documentation for accuracy and completeness, and I agree with the above. - Jinny Blossom, MD

## 2019-07-23 ENCOUNTER — Encounter (INDEPENDENT_AMBULATORY_CARE_PROVIDER_SITE_OTHER): Payer: Self-pay | Admitting: Family Medicine

## 2019-07-23 NOTE — Telephone Encounter (Signed)
Please advise 

## 2019-07-28 ENCOUNTER — Ambulatory Visit
Admission: RE | Admit: 2019-07-28 | Discharge: 2019-07-28 | Disposition: A | Discharge status: Home / Self Care | Payer: BC Managed Care – PPO | Source: Ambulatory Visit | Attending: Adult Health | Admitting: Adult Health

## 2019-07-28 ENCOUNTER — Other Ambulatory Visit: Payer: Self-pay

## 2019-07-28 DIAGNOSIS — C50212 Malignant neoplasm of upper-inner quadrant of left female breast: Secondary | ICD-10-CM

## 2019-07-28 DIAGNOSIS — Z170 Estrogen receptor positive status [ER+]: Secondary | ICD-10-CM

## 2019-08-02 ENCOUNTER — Other Ambulatory Visit (INDEPENDENT_AMBULATORY_CARE_PROVIDER_SITE_OTHER): Payer: Self-pay | Admitting: Family Medicine

## 2019-08-02 DIAGNOSIS — I10 Essential (primary) hypertension: Secondary | ICD-10-CM

## 2019-08-07 ENCOUNTER — Ambulatory Visit (INDEPENDENT_AMBULATORY_CARE_PROVIDER_SITE_OTHER): Payer: BC Managed Care – PPO | Admitting: Family Medicine

## 2019-08-07 ENCOUNTER — Other Ambulatory Visit: Payer: Self-pay

## 2019-08-07 ENCOUNTER — Encounter (INDEPENDENT_AMBULATORY_CARE_PROVIDER_SITE_OTHER): Payer: Self-pay | Admitting: Family Medicine

## 2019-08-07 VITALS — BP 121/85 | HR 70 | Temp 98.2°F | Ht 66.0 in | Wt 269.0 lb

## 2019-08-07 DIAGNOSIS — F418 Other specified anxiety disorders: Secondary | ICD-10-CM | POA: Diagnosis not present

## 2019-08-07 DIAGNOSIS — Z9189 Other specified personal risk factors, not elsewhere classified: Secondary | ICD-10-CM

## 2019-08-07 DIAGNOSIS — R7303 Prediabetes: Secondary | ICD-10-CM

## 2019-08-07 DIAGNOSIS — I10 Essential (primary) hypertension: Secondary | ICD-10-CM | POA: Diagnosis not present

## 2019-08-07 DIAGNOSIS — Z6841 Body Mass Index (BMI) 40.0 and over, adult: Secondary | ICD-10-CM

## 2019-08-08 LAB — COMPREHENSIVE METABOLIC PANEL
ALT: 12 IU/L (ref 0–32)
AST: 18 IU/L (ref 0–40)
Albumin/Globulin Ratio: 1.2 (ref 1.2–2.2)
Albumin: 3.8 g/dL (ref 3.8–4.9)
Alkaline Phosphatase: 75 IU/L (ref 48–121)
BUN/Creatinine Ratio: 21 (ref 9–23)
BUN: 18 mg/dL (ref 6–24)
Bilirubin Total: 0.2 mg/dL (ref 0.0–1.2)
CO2: 23 mmol/L (ref 20–29)
Calcium: 9.1 mg/dL (ref 8.7–10.2)
Chloride: 99 mmol/L (ref 96–106)
Creatinine, Ser: 0.84 mg/dL (ref 0.57–1.00)
GFR calc Af Amer: 90 mL/min/{1.73_m2} (ref >59)
GFR calc non Af Amer: 78 mL/min/{1.73_m2} (ref >59)
Globulin, Total: 3.2 g/dL (ref 1.5–4.5)
Glucose: 92 mg/dL (ref 65–99)
Potassium: 3.9 mmol/L (ref 3.5–5.2)
Sodium: 135 mmol/L (ref 134–144)
Total Protein: 7 g/dL (ref 6.0–8.5)

## 2019-08-08 LAB — LIPID PANEL WITH LDL/HDL RATIO
Cholesterol, Total: 138 mg/dL (ref 100–199)
HDL: 69 mg/dL (ref >39)
LDL Chol Calc (NIH): 54 mg/dL (ref 0–99)
LDL/HDL Ratio: 0.8 ratio (ref 0.0–3.2)
Triglycerides: 78 mg/dL (ref 0–149)
VLDL Cholesterol Cal: 15 mg/dL (ref 5–40)

## 2019-08-08 LAB — HEMOGLOBIN A1C
Est. average glucose Bld gHb Est-mCnc: 123 mg/dL
Hgb A1c MFr Bld: 5.9 % — ABNORMAL HIGH (ref 4.8–5.6)

## 2019-08-08 LAB — INSULIN, RANDOM: INSULIN: 7.2 u[IU]/mL (ref 2.6–24.9)

## 2019-08-11 NOTE — Progress Notes (Signed)
Chief Complaint:   OBESITY Roberta [name] is here to discuss her progress with her obesity treatment plan along with follow-up of her obesity related diagnoses. Roberta [name] is on the Category 3 Plan with 6 oz of meat at dinner and states she is following her eating plan approximately 80% of the time. Roberta [name] states she is walking for 30 minutes 3 times per week.  Today's visit was #: 54 Starting weight: 292 lbs Starting date: 10/08/2017 Today's weight: 269 lbs Today's date: 08/07/2019 Total lbs lost to date: 23 Total lbs lost since last in-office visit: 2  Interim History: Roberta [name] has been doing more protein shakes because she has a whole case to get rid of. She is trying to catch up on sleep. She definitely is putting effort in but there are days she isn't getting all of the food in. She is doing protein shake replacing breakfast, and occasionally lunch. She is adding food to the protein shake.  Subjective:   1. Essential hypertension Roberta [name]'s blood pressure is well controlled. She denies chest pain, chest pressure, or headaches.  2. Pre-diabetes Roberta [name]'s last A1c was 6.4 and insulin was not done in January. She is not on medications.  3. Depression with anxiety Roberta [name] denies suicidal ideas or homicidal ideas. She is on Zoloft 50 mg and she notes her symptoms have improved with Zoloft.  4. At risk for heart disease Roberta [name] is at a higher than average risk for cardiovascular disease due to obesity.   Assessment/Plan:   1. Essential hypertension [name] is working on healthy weight loss and exercise to improve blood pressure control. We will watch for signs of hypotension as she continues her lifestyle modifications. Roberta [name] will continue Cozaar and hydrochlorothiazide, and we will check labs today.  - Lipid Panel With LDL/HDL Ratio - Comprehensive metabolic panel  2. Pre-diabetes Roberta [name] will continue to work on weight loss, exercise, and decreasing simple carbohydrates to help decrease the risk of  diabetes. We will check labs today.  - Insulin, random - Hemoglobin A1c  3. Depression with anxiety Behavior modification techniques were discussed today to help Roberta [name] deal with her emotional/non-hunger eating behaviors. Roberta [name] will continue Zoloft, no refill needed. Orders and follow up as documented in patient record.   4. At risk for heart disease Roberta [name] was given approximately 15 minutes of coronary artery disease prevention counseling today. She is 54 y.o. female and has risk factors for heart disease including obesity. We discussed intensive lifestyle modifications today with an emphasis on specific weight loss instructions and strategies.   Repetitive spaced learning was employed today to elicit superior memory formation and behavioral change.  5. Class 3 severe obesity with serious comorbidity and body mass index (BMI) of 40.0 to 44.9 in adult, unspecified obesity type (HCC) Roberta [name] is currently in the action stage of change. As such, her goal is to continue with weight loss efforts. She has agreed to the Category 3 Plan.   Exercise goals: As is.  Behavioral modification strategies: increasing lean protein intake, meal planning and cooking strategies, keeping healthy foods in the home and planning for success.  [name] has agreed to follow-up with our clinic in 3 weeks. She was informed of the importance of frequent follow-up visits to maximize her success with intensive lifestyle modifications for her multiple health conditions.   Roberta [name] was informed we would discuss her lab results at her next visit unless there is a critical issue that needs to be addressed sooner. Roberta [name] agreed to keep her next  visit at the agreed upon time to discuss these results.  Objective:   Blood pressure 121/85, pulse 70, temperature 98.2 F (36.8 C), temperature source Oral, height 5\' 6"  (1.676 m), weight 269 lb (122 kg), SpO2 98 %. Body mass index is 43.42 kg/m.  General: Cooperative, alert, well  developed, in no acute distress. HEENT: Conjunctivae and lids unremarkable. Cardiovascular: Regular rhythm.  Lungs: Normal work of breathing. Neurologic: No focal deficits.   Lab Results  Component Value Date   CREATININE 0.84 08/07/2019   BUN 18 08/07/2019   NA 135 08/07/2019   K 3.9 08/07/2019   CL 99 08/07/2019   CO2 23 08/07/2019   Lab Results  Component Value Date   ALT 12 08/07/2019   AST 18 08/07/2019   ALKPHOS 75 08/07/2019   BILITOT 0.2 08/07/2019   Lab Results  Component Value Date   HGBA1C 5.9 (H) 08/07/2019   HGBA1C 6.4 (H) 01/27/2019   HGBA1C 6.0 (H) 09/25/2018   HGBA1C 5.9 (H) 07/17/2018   HGBA1C 6.1 (H) 01/31/2018   Lab Results  Component Value Date   INSULIN 7.2 08/07/2019   INSULIN 13.4 01/31/2018   INSULIN 10.6 10/08/2017   Lab Results  Component Value Date   TSH 1.480 10/08/2017   Lab Results  Component Value Date   CHOL 138 08/07/2019   HDL 69 08/07/2019   LDLCALC 54 08/07/2019   TRIG 78 08/07/2019   CHOLHDL 2 06/25/2017   Lab Results  Component Value Date   WBC 5.3 01/30/2019   HGB 11.4 (L) 01/30/2019   HCT 35.8 (L) 01/30/2019   MCV 81.0 01/30/2019   PLT 241 01/30/2019   Lab Results  Component Value Date   IRON 162 (H) 01/22/2017   FERRITIN 14.5 01/22/2017   Attestation Statements:   Reviewed by clinician on day of visit: allergies, medications, problem list, medical history, surgical history, family history, social history, and previous encounter notes.   I, Trixie Dredge, am acting as transcriptionist for Coralie Common, MD.  I have reviewed the above documentation for accuracy and completeness, and I agree with the above. - Jinny Blossom, MD

## 2019-08-15 LAB — HM PAP SMEAR: HPV 16/18/45 genotyping: NEGATIVE

## 2019-08-21 ENCOUNTER — Encounter (INDEPENDENT_AMBULATORY_CARE_PROVIDER_SITE_OTHER): Payer: Self-pay | Admitting: Family Medicine

## 2019-08-21 ENCOUNTER — Ambulatory Visit (INDEPENDENT_AMBULATORY_CARE_PROVIDER_SITE_OTHER): Payer: BC Managed Care – PPO | Admitting: Family Medicine

## 2019-08-21 ENCOUNTER — Other Ambulatory Visit: Payer: Self-pay

## 2019-08-21 VITALS — BP 102/70 | HR 72 | Temp 98.1°F | Ht 66.0 in | Wt 269.0 lb

## 2019-08-21 DIAGNOSIS — R7303 Prediabetes: Secondary | ICD-10-CM | POA: Diagnosis not present

## 2019-08-21 DIAGNOSIS — F32A Depression, unspecified: Secondary | ICD-10-CM

## 2019-08-21 DIAGNOSIS — Z6841 Body Mass Index (BMI) 40.0 and over, adult: Secondary | ICD-10-CM

## 2019-08-21 DIAGNOSIS — F419 Anxiety disorder, unspecified: Secondary | ICD-10-CM | POA: Diagnosis not present

## 2019-08-21 DIAGNOSIS — F329 Major depressive disorder, single episode, unspecified: Secondary | ICD-10-CM | POA: Diagnosis not present

## 2019-08-21 NOTE — Progress Notes (Signed)
Chief Complaint:   OBESITY Roberta [name] is here to discuss her progress with her obesity treatment plan along with follow-up of her obesity related diagnoses. Roberta [name] is on the Category 3 Plan and states she is following her eating plan approximately 80% of the time. Roberta [name] states she is at the gym for 60 minutes 1 time per week.  Today's visit was #: 68 Starting weight: 292 lbs Starting date: 10/08/2017 Today's weight: 269 lbs Today's date: 08/21/2019 Total lbs lost to date: 23 Total lbs lost since last in-office visit: 0  Interim History: Roberta [name] felt she was having a heart attack 4 days ago (called 911 and was told she may be having a panic attack or indigestion). Since that appointment she has been on the meal plan completely. She didn't go to the gym the last 2 days secondary to late days at work.  Subjective:   1. Pre-diabetes Roberta [name]'s last A1c was 5.9 and insulin 7.2. She was previously on metformin, and she notes minimal carbohydrate cravings.  2. Anxiety and depression Roberta [name] is on Zoloft with some anxiety with starting school. She denies suicidal ideas or homicidal ideas.  Assessment/Plan:   1. Pre-diabetes Roberta [name] will continue to work on weight loss, exercise, and decreasing simple carbohydrates to help decrease the risk of diabetes. We will follow up on labs in early 2022.  2. Anxiety and depression Behavior modification techniques were discussed today to help Roberta [name] deal with her anxiety and depression. Roberta [name] will continue Zoloft with no change in dose. Orders and follow up as documented in patient record.   3. Class 3 severe obesity with serious comorbidity and body mass index (BMI) of 40.0 to 44.9 in adult, unspecified obesity type (HCC) Roberta [name] is currently in the action stage of change. As such, her goal is to continue with weight loss efforts. She has agreed to the Category 3 Plan.   Exercise goals: As is.  Behavioral modification strategies: increasing lean protein intake,  meal planning and cooking strategies, keeping healthy foods in the home and planning for success.  Roberta [name] has agreed to follow-up with our clinic in 2 to 3 weeks. She was informed of the importance of frequent follow-up visits to maximize her success with intensive lifestyle modifications for her multiple health conditions.   Objective:   Blood pressure 102/70, pulse 72, temperature 98.1 F (36.7 C), temperature source Oral, height 5\' 6"  (1.676 m), weight 269 lb (122 kg), SpO2 98 %. Body mass index is 43.42 kg/m.  General: Cooperative, alert, well developed, in no acute distress. HEENT: Conjunctivae and lids unremarkable. Cardiovascular: Regular rhythm.  Lungs: Normal work of breathing. Neurologic: No focal deficits.   Lab Results  Component Value Date   CREATININE 0.84 08/07/2019   BUN 18 08/07/2019   NA 135 08/07/2019   K 3.9 08/07/2019   CL 99 08/07/2019   CO2 23 08/07/2019   Lab Results  Component Value Date   ALT 12 08/07/2019   AST 18 08/07/2019   ALKPHOS 75 08/07/2019   BILITOT 0.2 08/07/2019   Lab Results  Component Value Date   HGBA1C 5.9 (H) 08/07/2019   HGBA1C 6.4 (H) 01/27/2019   HGBA1C 6.0 (H) 09/25/2018   HGBA1C 5.9 (H) 07/17/2018   HGBA1C 6.1 (H) 01/31/2018   Lab Results  Component Value Date   INSULIN 7.2 08/07/2019   INSULIN 13.4 01/31/2018   INSULIN 10.6 10/08/2017   Lab Results  Component Value Date   TSH 1.480 10/08/2017   Lab Results  Component Value Date   CHOL 138 08/07/2019   HDL 69 08/07/2019   LDLCALC 54 08/07/2019   TRIG 78 08/07/2019   CHOLHDL 2 06/25/2017   Lab Results  Component Value Date   WBC 5.3 01/30/2019   HGB 11.4 (L) 01/30/2019   HCT 35.8 (L) 01/30/2019   MCV 81.0 01/30/2019   PLT 241 01/30/2019   Lab Results  Component Value Date   IRON 162 (H) 01/22/2017   FERRITIN 14.5 01/22/2017   Attestation Statements:   Reviewed by clinician on day of visit: allergies, medications, problem list, medical history,  surgical history, family history, social history, and previous encounter notes.  Time spent on visit including pre-visit chart review and post-visit care and charting was 16 minutes.    I, Trixie Dredge, am acting as transcriptionist for Coralie Common, MD.  I have reviewed the above documentation for accuracy and completeness, and I agree with the above. - Jinny Blossom, MD

## 2019-08-21 NOTE — Progress Notes (Signed)
Subjective:    Patient ID: Roberta [name], female    DOB: 04/20/1965, 54 y.o.   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  She had a couple of glasses of wine that evening.   Sat morning at 5 am she woke up and had severe pressure in her chest - it like a 500 lb man was sitting on her chest.  She called 911.  Her bp was 200/100 by EMS initially. (typically her BP is very well controlled)  EKG showed sinus tach at 118 with non-specific t wave abnormality .   Repeat bp 169/?.  paramedic ddx:  GERD, panic attack.  She did not go to the hospital.   The rest of Sat she stayed in bed.  Sunday she felt better just felt drained.  Monday she went to work she did ok.    No prior episodes.    She goes to the gym - she walks and runs a very little.  She does elliptical.  She feels good when she does that - she denies lightheadedness, chest pain, lightheadedness or palps.   Denies ever having gerd.  She is leary about going back to school.  She has some increased stress.  She wonders about increasing her zoloft.  School is stressful and she worries about covid.      Medications and allergies reviewed with patient and updated if appropriate.  Patient Active Problem List   Diagnosis Date Noted  . Premenstrual dysphoric disorder 12/22/2018  . Anxiety and depression 12/10/2018  . Morbid obesity with BMI of 40.0-44.9, adult ([place]) 09/04/2018  . Malignant neoplasm of upper-inner quadrant of left breast in female, estrogen receptor positive ([place]) 09/02/2018  . Vitamin D deficiency, taking vit D 5000 IU daily 02/04/2018  . Weight gain following gastric bypass surgery 06/25/2017  . 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 Left hip pain 02/10/2017  . Microcytic anemia, mild 01/21/2017  . Malabsorption, due to gastic bypass 2005 12/13/2015  . Prediabetes 12/13/2015  . Class 3 severe obesity with serious  comorbidity and body mass index (BMI) of 40.0 to 44.9 in adult ([place]) 12/13/2015  . Globus sensation 12/13/2015  . Leg edema, L > R 12/13/2015  . Dense breasts 12/13/2011  . 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 Hypertension, Rx Lisinopril-HCTZ 11/10/2011    Current Outpatient Medications on File Prior to Visit  Medication Sig Dispense Refill  . gabapentin (NEURONTIN) 300 MG capsule Take 1 capsule (300 mg total) by mouth at bedtime. 90 capsule 4  . hydrochlorothiazide (HYDRODIURIL) 12.5 MG tablet Take 1 tablet (12.5 mg total) by mouth daily. 30 tablet 0  . losartan (COZAAR) 25 MG tablet Take 0.5 tablets (12.5 mg total) by mouth daily. 30 tablet 0  . metFORMIN (GLUCOPHAGE) 500 MG tablet Take 500 mg by mouth every morning.    . Multiple Vitamin (MULTIVITAMIN) tablet Take 1 tablet by mouth daily.    . mupirocin ointment (BACTROBAN) 2 % Apply 1 application topically 2 (two) times daily as needed. 22 g 0  . sertraline (ZOLOFT) 50 MG tablet Take 1 tablet (50 mg total) by mouth daily. 30 tablet 0  . tamoxifen (NOLVADEX) 20 MG tablet TAKE 1 TABLET BY MOUTH EVERY DAY 90 tablet 3   No current facility-administered medications on file prior to  visit.    Past Medical History:  Diagnosis Date  . Back pain   . Cancer [facility])    multifocal left breast  . Depression   . Hip pain   . Hypertension   . Obesity   . PONV (postoperative nausea and vomiting)   . Prediabetes   . Swelling    bilat LE  . Wears glasses     Past Surgical History:  Procedure Laterality Date  . CHOLECYSTECTOMY    . COLONOSCOPY    . DILATION AND CURETTAGE OF UTERUS    . GASTRIC BYPASS    . KNEE SURGERY  2002   Ligament repair  . MASTECTOMY Left 2019  . MASTECTOMY W/ SENTINEL NODE BIOPSY Left 10/01/2018   Procedure: LEFT TOTAL MASTECTOMY WITH SENTINEL LYMPH NODE BIOPSY AND BLUE DYE INJECTION;  Surgeon: Fanny Skates, MD;  Location: [place];   Service: General;  Laterality: Left;    Social History   Socioeconomic History  . Marital status: Legally Separated    Spouse name: Not on file  . Number of children: Not on file  . Years of education: Not on file  . Highest education level: Not on file  Occupational History  . Not on file  Tobacco Use  . Smoking status: Never Smoker  . Smokeless tobacco: Never Used  Vaping Use  . Vaping Use: Never used  Substance and Sexual Activity  . Alcohol use: Yes    Comment: occasional wine  . Drug use: No  . Sexual activity: Not Currently  Other Topics Concern  . Not on file  Social History Narrative  . Not on file   Social Determinants of Health   Financial Resource Strain:   . Difficulty of Paying Living Expenses: Not on file  Food Insecurity:   . Worried About Charity fundraiser in the Last Year: Not on file  . Ran Out of Food in the Last Year: Not on file  Transportation Needs:   . Lack of Transportation (Medical): Not on file  . Lack of Transportation (Non-Medical): Not on file  Physical Activity:   . Days of Exercise per Week: Not on file  . Minutes of Exercise per Session: Not on file  Stress:   . Feeling of Stress : Not on file  Social Connections:   . Frequency of Communication with Friends and Family: Not on file  . Frequency of Social Gatherings with Friends and Family: Not on file  . Attends Religious Services: Not on file  . Active Member of Clubs or Organizations: Not on file  . Attends Archivist Meetings: Not on file  . Marital Status: Not on file    Family History  Problem Relation Age of Onset  . Diabetes Mother   . Hypertension Mother   . Heart disease Mother   . Stroke Mother   . Kidney disease Mother   . Obesity Mother   . Cancer Maternal Aunt        lung  . Cancer Maternal Uncle        lung  . Throat cancer Maternal Aunt     Review of Systems  Constitutional: Negative for chills and fever.  Respiratory: Negative for cough,  shortness of breath and wheezing.   Cardiovascular: Positive for chest pain (just the one episode). Negative for palpitations and leg swelling.  Gastrointestinal: Negative for nausea.  Neurological: Negative for dizziness, light-headedness and headaches.       Objective:   Vitals:  08/22/19 0921  BP: 104/68  Pulse: 75  Temp: 98.1 F (36.7 C)  SpO2: 96%   BP Readings from Last 3 Encounters:  08/22/19 104/68  08/21/19 102/70  08/07/19 121/85   Wt Readings from Last 3 Encounters:  08/22/19 271 lb (122.9 kg)  08/21/19 269 lb (122 kg)  08/07/19 269 lb (122 kg)   Body mass index is 43.74 kg/m.   Physical Exam    Constitutional: Appears well-developed and well-nourished. No distress.  HENT:  Head: Normocephalic and atraumatic.  Neck: Neck supple. No tracheal deviation present. No thyromegaly present.  No cervical lymphadenopathy Cardiovascular: Normal rate, regular rhythm and normal heart sounds.   No murmur heard. No carotid bruit .  No edema Pulmonary/Chest: Effort normal and breath sounds normal. No respiratory distress. No has no wheezes. No rales.  Skin: Skin is warm and dry. Not diaphoretic.  Psychiatric: Normal mood and affect. Behavior is normal.      Assessment & Plan:    See Problem List for Assessment and Plan of chronic medical problems.    This visit occurred during the SARS-CoV-2 public health emergency.  Safety protocols were in place, including screening questions prior to the visit, additional usage of staff PPE, and extensive cleaning of exam room while observing appropriate contact time as indicated for disinfecting solutions.

## 2019-08-22 ENCOUNTER — Other Ambulatory Visit: Payer: Self-pay

## 2019-08-22 ENCOUNTER — Encounter: Payer: Self-pay | Admitting: Internal Medicine

## 2019-08-22 ENCOUNTER — Ambulatory Visit (INDEPENDENT_AMBULATORY_CARE_PROVIDER_SITE_OTHER): Payer: BC Managed Care – PPO | Admitting: Internal Medicine

## 2019-08-22 VITALS — BP 104/68 | HR 75 | Temp 98.1°F | Ht 66.0 in | Wt 271.0 lb

## 2019-08-22 DIAGNOSIS — I10 Essential (primary) hypertension: Secondary | ICD-10-CM | POA: Diagnosis not present

## 2019-08-22 DIAGNOSIS — R0789 Other chest pain: Secondary | ICD-10-CM | POA: Diagnosis not present

## 2019-08-22 DIAGNOSIS — F419 Anxiety disorder, unspecified: Secondary | ICD-10-CM

## 2019-08-22 DIAGNOSIS — Z9884 Bariatric surgery status: Secondary | ICD-10-CM

## 2019-08-22 DIAGNOSIS — F32A Depression, unspecified: Secondary | ICD-10-CM

## 2019-08-22 DIAGNOSIS — F329 Major depressive disorder, single episode, unspecified: Secondary | ICD-10-CM

## 2019-08-22 LAB — VITAMIN B12: Vitamin B-12: 815 pg/mL (ref 200–1100)

## 2019-08-22 MED ORDER — VENLAFAXINE HCL ER 37.5 MG PO CP24: 37.5000 mg | ORAL_CAPSULE | Freq: Every day | ORAL | 5 refills | Status: DC

## 2019-08-22 NOTE — Patient Instructions (Addendum)
  Blood work was ordered.    An EKG was done today.     Medications reviewed and updated.  Changes include :   Start effexor daily in the morning - after a few days of overlapping with the sertraline ( zoloft) stop the sertraline  Your prescription(s) have been submitted to your pharmacy. Please take as directed and contact our office if you believe you are having problem(s) with the medication(s).    Please followup in 6 months

## 2019-08-22 NOTE — Assessment & Plan Note (Addendum)
Chronic BP on low side ? Need losartan - can hold and monitor BP at home Continue hctz

## 2019-08-24 NOTE — Assessment & Plan Note (Signed)
Chronic Not ideally controlled Will d/c zofolt and start effexor - would prefer not to increase zoloft given she is on tamoxifen effexor 37.5 mg daily

## 2019-08-24 NOTE — Assessment & Plan Note (Signed)
Acute One episode - occurred at 5 am  - there has been increased stress so it may have been a panic attack.  She also had two glasses of wine that night and it may have been atypical GERD Unlikely CAD/ angina EMS evaluated her - EKG then sinus tach w/ t wave abn  --- EKG today shows sinus brady at 59 bmp and no acute changes, normal EKG No angina symptoms with exercise Will monitor -- no further evaluation at this time Will start effexor and try to get anxiety/ stress better controlled

## 2019-08-24 NOTE — Assessment & Plan Note (Signed)
Chronic malabsorption Check B12 level  - she is fatigued and wonder if her level is low

## 2019-08-29 IMAGING — US ULTRASOUND LEFT BREAST LIMITED
1 series · 9 of 9 positions shown · non-contrast
Comparison: Previous exam(s).

CLINICAL DATA: 53-year-old female presenting for evaluation of
palpable lumps in the superior left breast identified on clinical
breast exam.

EXAM:
DIGITAL DIAGNOSTIC LEFT MAMMOGRAM WITH CAD AND TOMO
ULTRASOUND LEFT BREAST

[Series 1: ultrasound left breast limited · 0.06mm/px · 9 of 9 slices shown]
[im 1/9]
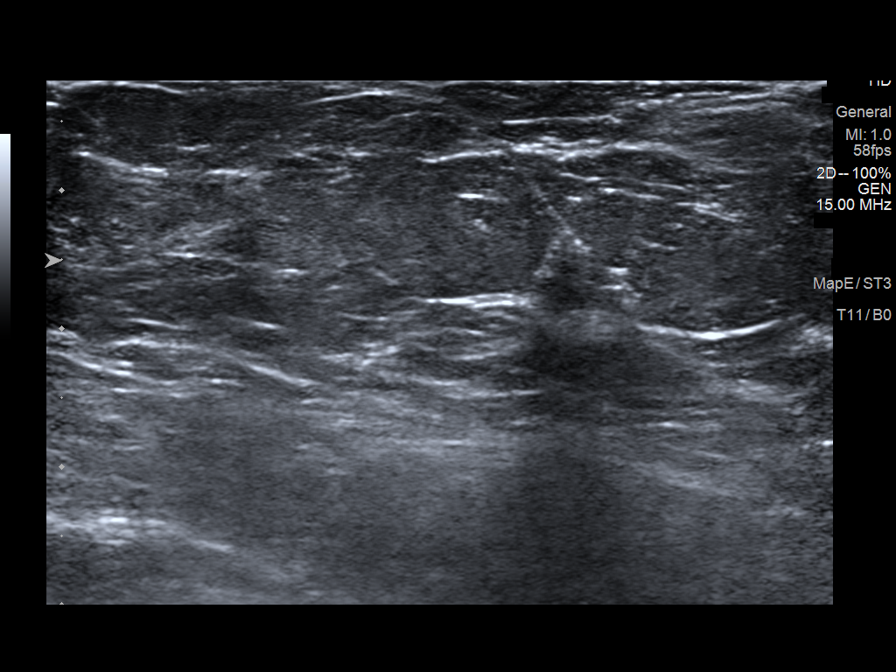
[im 2/9]
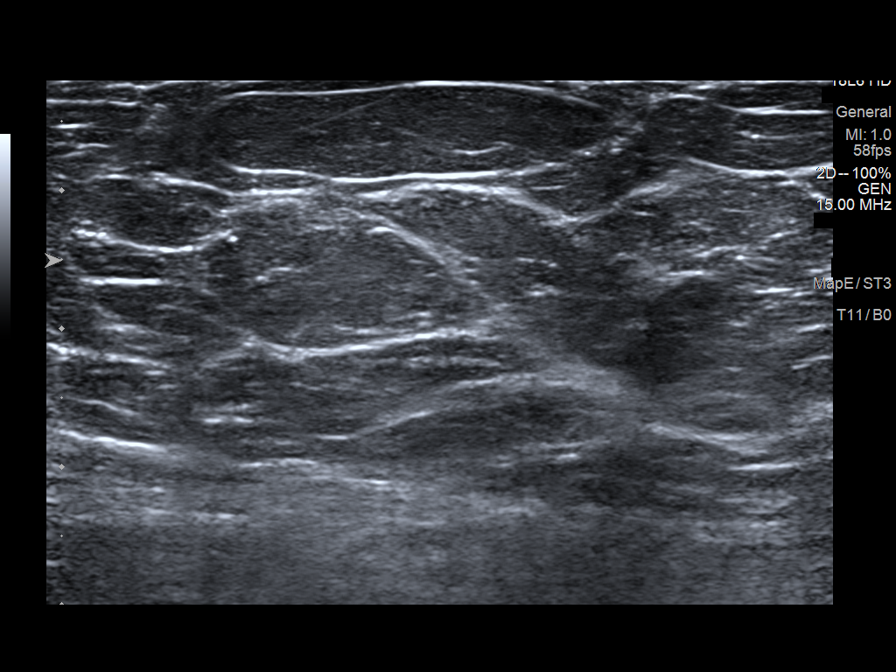
[im 3/9]
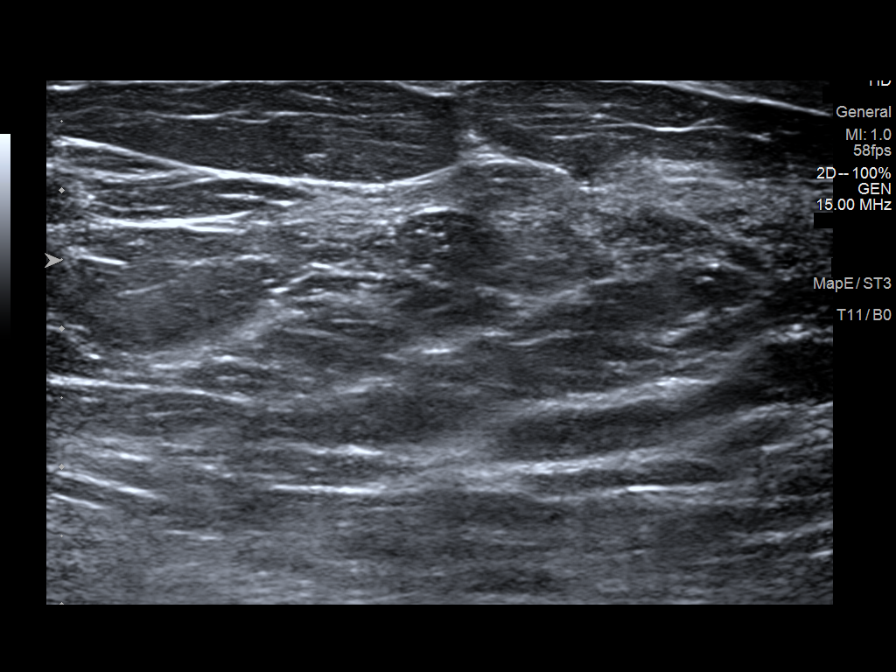
[im 4/9]
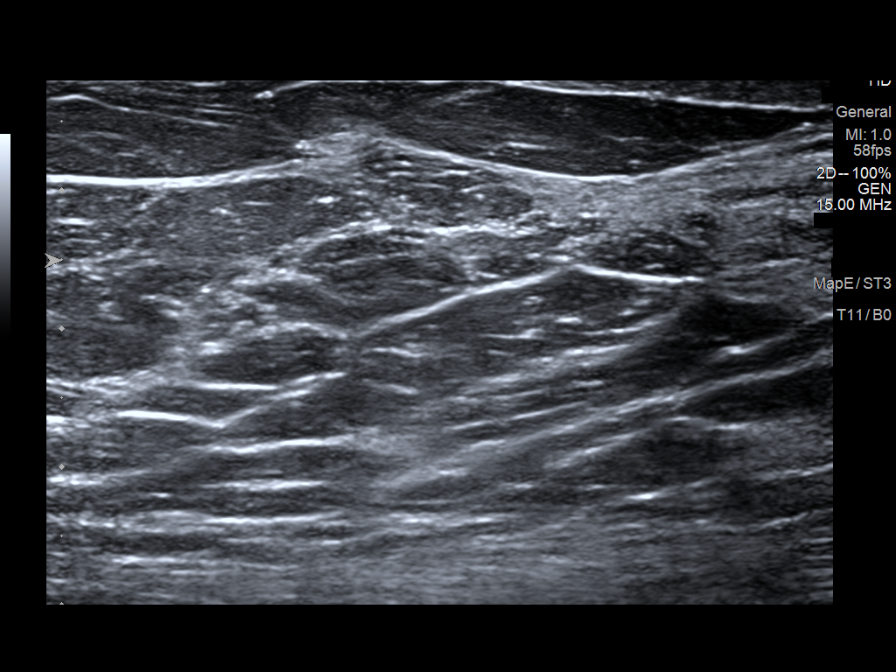
[im 5/9]
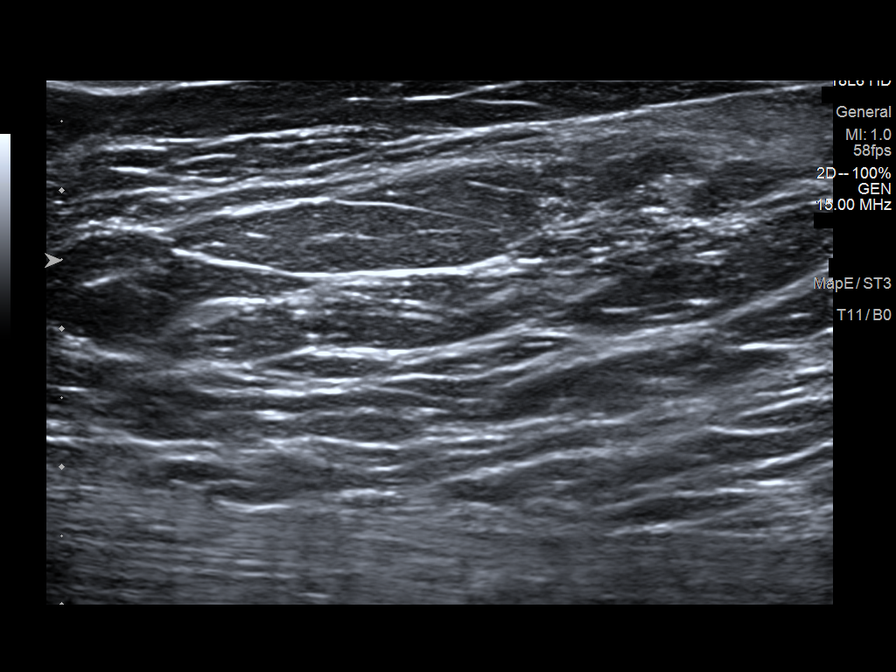
[im 6/9]
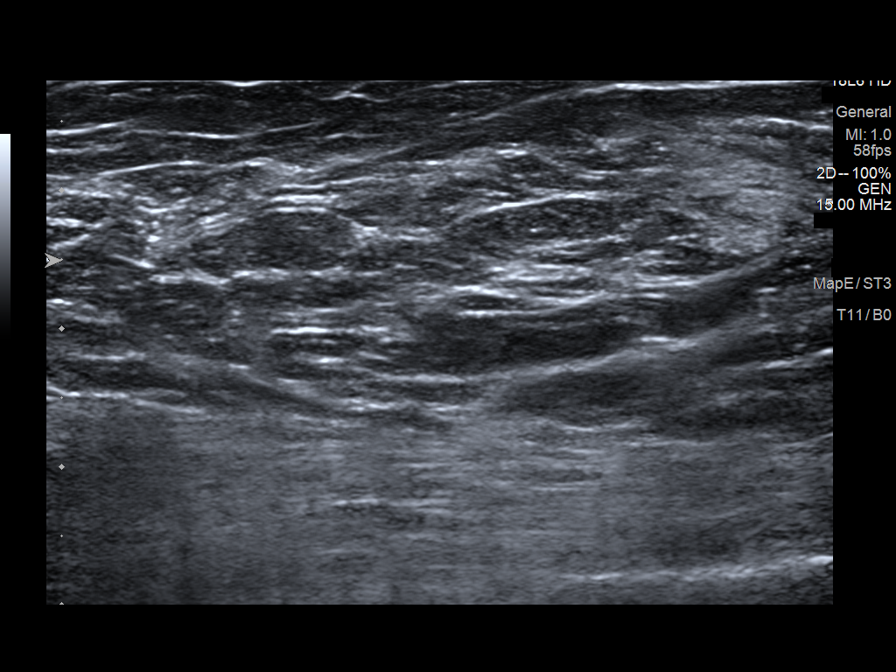
[im 7/9]
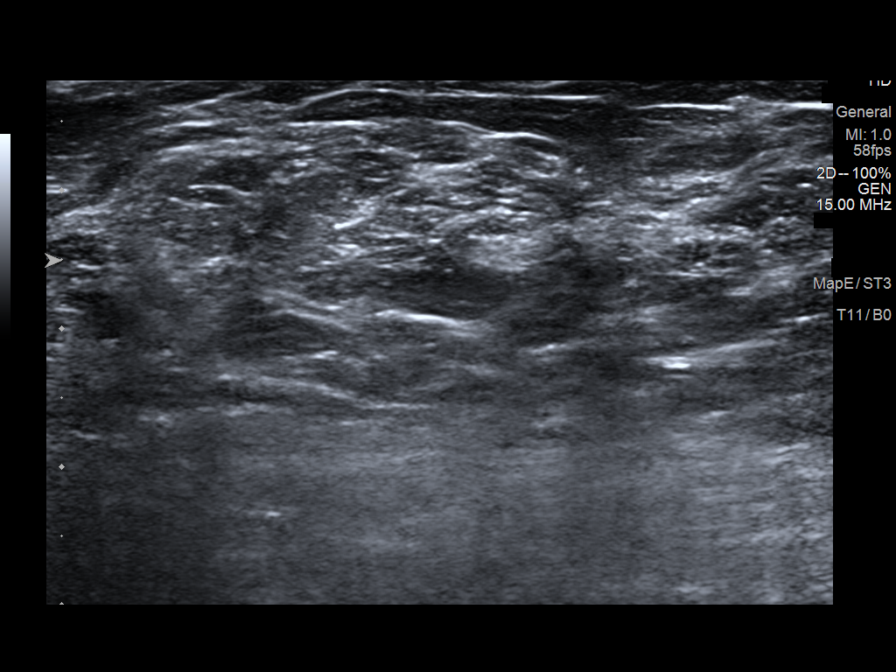
[im 8/9]
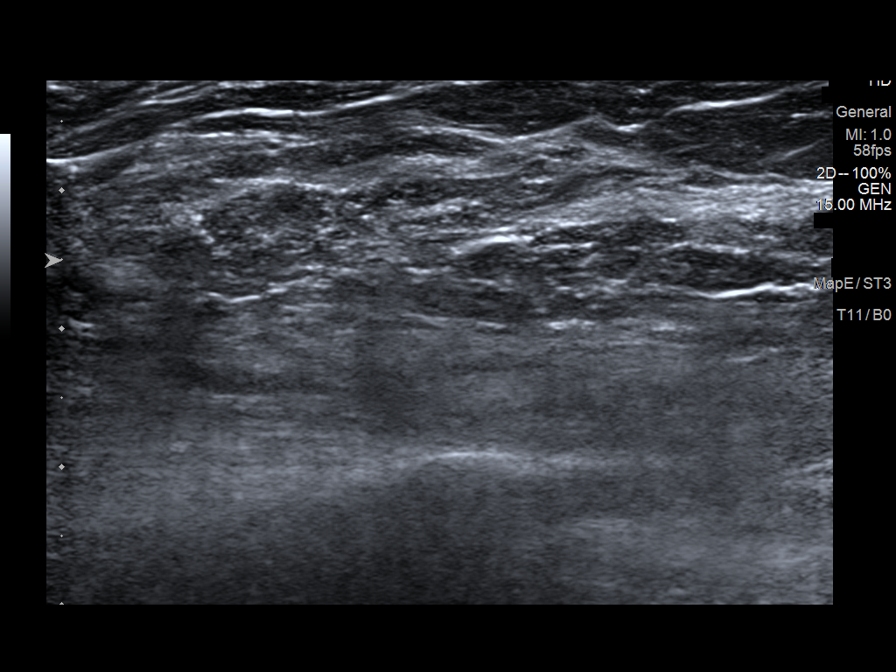
[im 9/9]
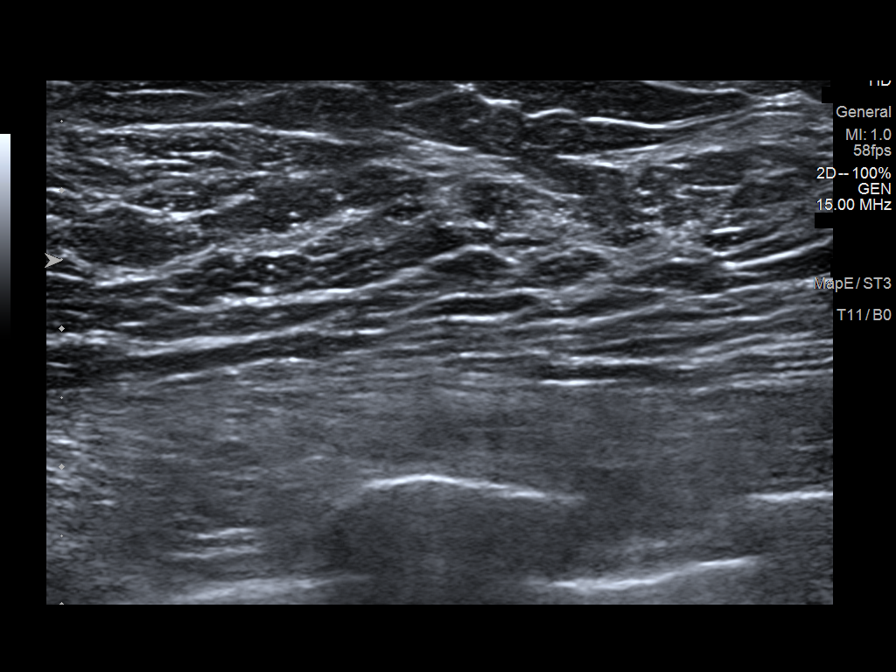

[9 of 9 positions shown; findings below may reference images not displayed]

ACR Breast Density Category c: The breast tissue is heterogeneously
dense, which may obscure small masses.
FINDINGS: No suspicious masses are seen on the tomosynthesis images of the
left breast. There are 2 groups of indeterminate calcifications
however, one in the superior anterior superficial left breast which
spans 8 mm. In the upper inner left breast, there is a 2.2 cm area
of loosely grouped amorphous calcifications.

Mammographic images were processed with CAD.

Normal fibroglandular tissue in the upper inner and upper outer
quadrants of the left breast.
IMPRESSION: 1. There are 2 groups of indeterminate calcifications in the left
breast.

2. No mammographic or targeted sonographic abnormalities in the left
breast to correspond with the masses identified on clinical breast
exam.

RECOMMENDATION:
1. Stereotactic biopsy is recommended for the 2 groups of
calcifications in the left breast. This has been scheduled for
08/29/2018 at [DATE] a.m.

2. Clinical follow-up recommended for the palpable areas of concern
in the superior left breast. Any further workup should be based on
clinical grounds.

I have discussed the findings and recommendations with the patient.
Results were also provided in writing at the conclusion of the
visit. If applicable, a reminder letter will be sent to the patient
regarding the next appointment.

BI-RADS CATEGORY  4: Suspicious.

## 2019-09-04 ENCOUNTER — Encounter (INDEPENDENT_AMBULATORY_CARE_PROVIDER_SITE_OTHER): Payer: Self-pay | Admitting: Family Medicine

## 2019-09-04 ENCOUNTER — Ambulatory Visit (INDEPENDENT_AMBULATORY_CARE_PROVIDER_SITE_OTHER): Payer: BC Managed Care – PPO | Admitting: Family Medicine

## 2019-09-04 ENCOUNTER — Other Ambulatory Visit: Payer: Self-pay

## 2019-09-04 VITALS — BP 121/79 | HR 73 | Temp 98.0°F | Ht 66.0 in | Wt 266.0 lb

## 2019-09-04 DIAGNOSIS — Z6841 Body Mass Index (BMI) 40.0 and over, adult: Secondary | ICD-10-CM

## 2019-09-04 DIAGNOSIS — I10 Essential (primary) hypertension: Secondary | ICD-10-CM

## 2019-09-04 DIAGNOSIS — F418 Other specified anxiety disorders: Secondary | ICD-10-CM

## 2019-09-04 DIAGNOSIS — R7303 Prediabetes: Secondary | ICD-10-CM

## 2019-09-04 IMAGING — MG MM BREAST BX W LOC DEV EA AD LESION IMG BX SPEC STEREO GUIDE*L*
7 series · 7 of 19 positions shown · non-contrast
Comparison: Previous exams.
COMPARISON: Previous exams.

Addendum:
CLINICAL DATA: Patient with indeterminate left breast
calcifications, 2 sites.

EXAM:
LEFT BREAST STEREOTACTIC CORE NEEDLE BIOPSY

[L (1 of 3)]
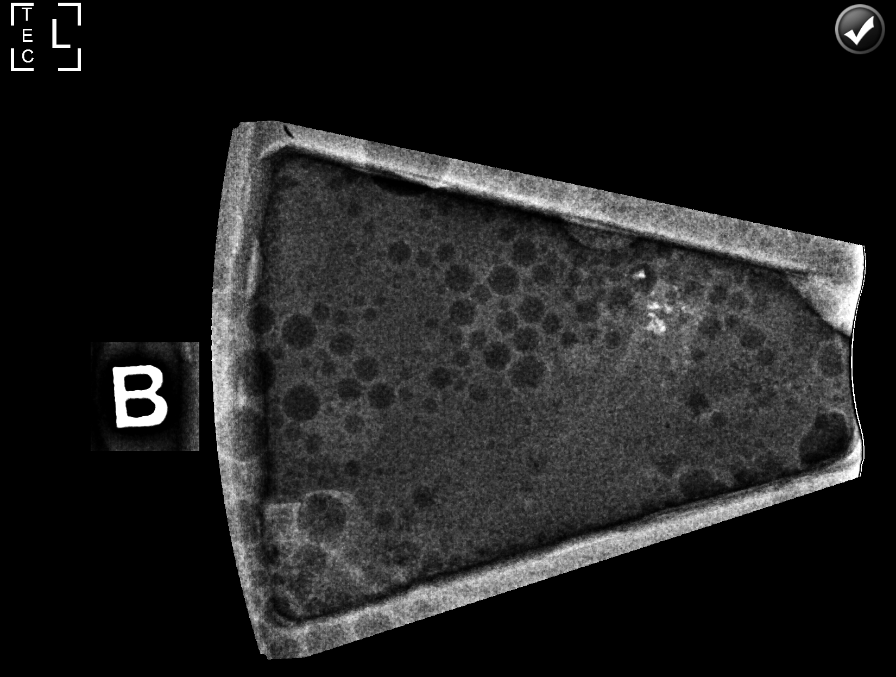

[L (2 of 3)]
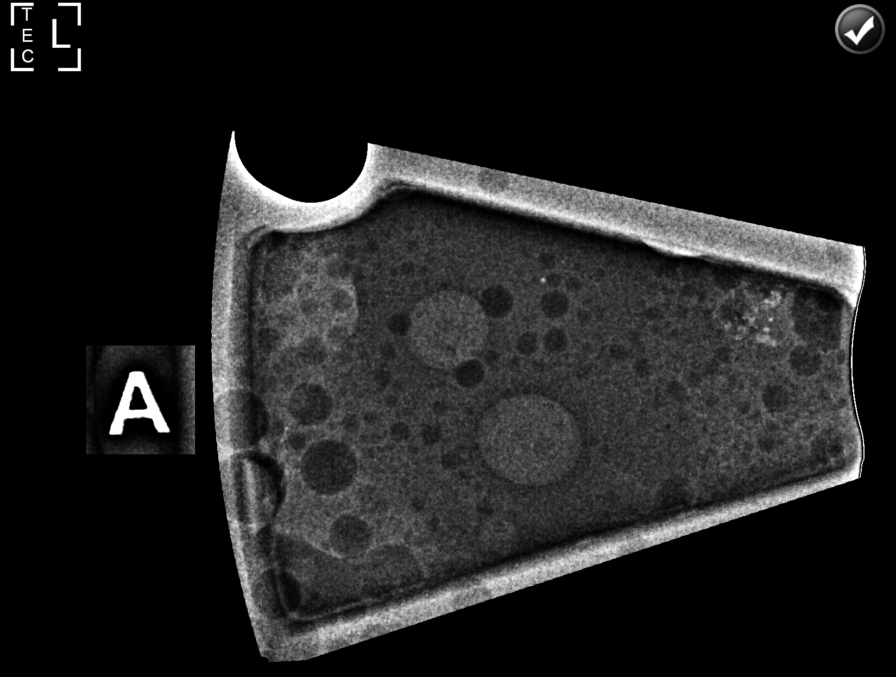

[L (3 of 3)]
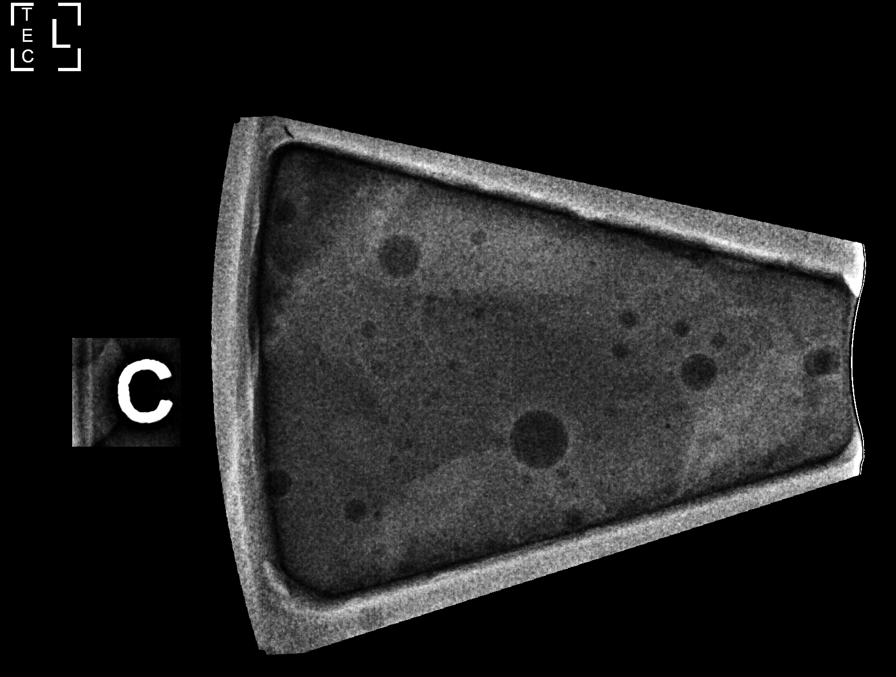

[L CC]
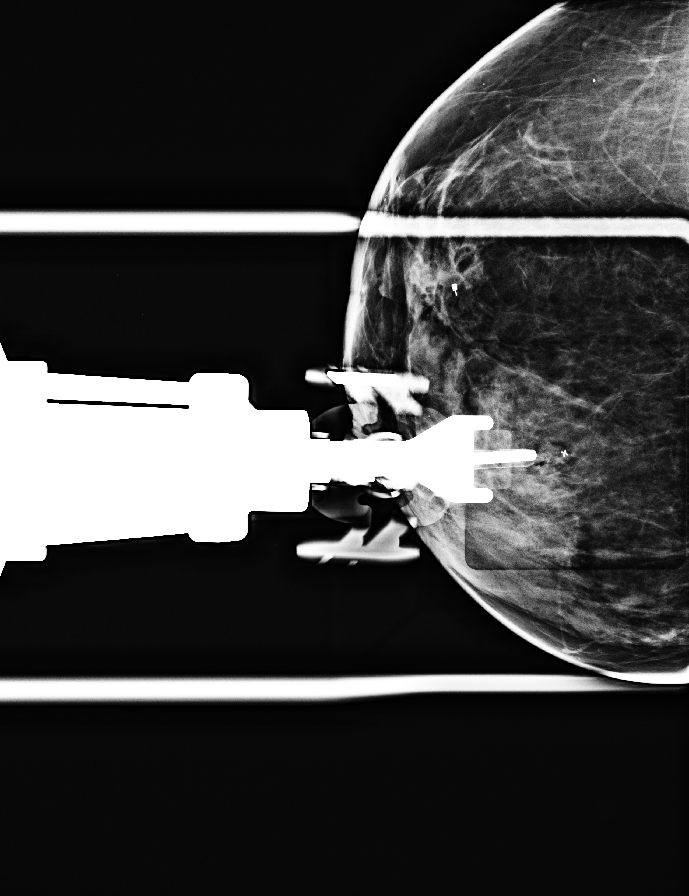

[L CC tomo (1 of 3) · tomo slice 24/47.0]
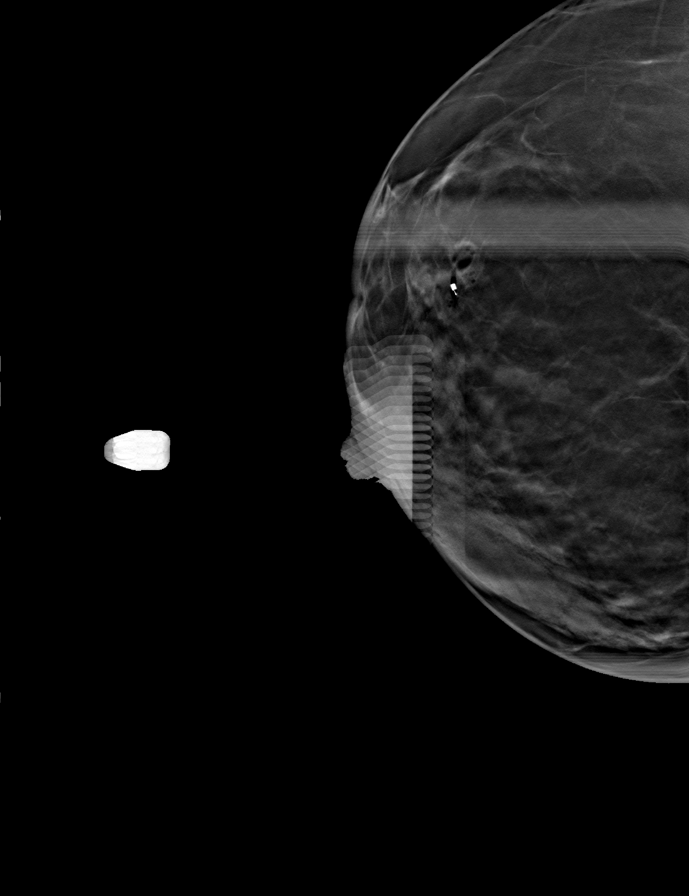

[L CC tomo (2 of 3) · tomo slice 23/45.0]
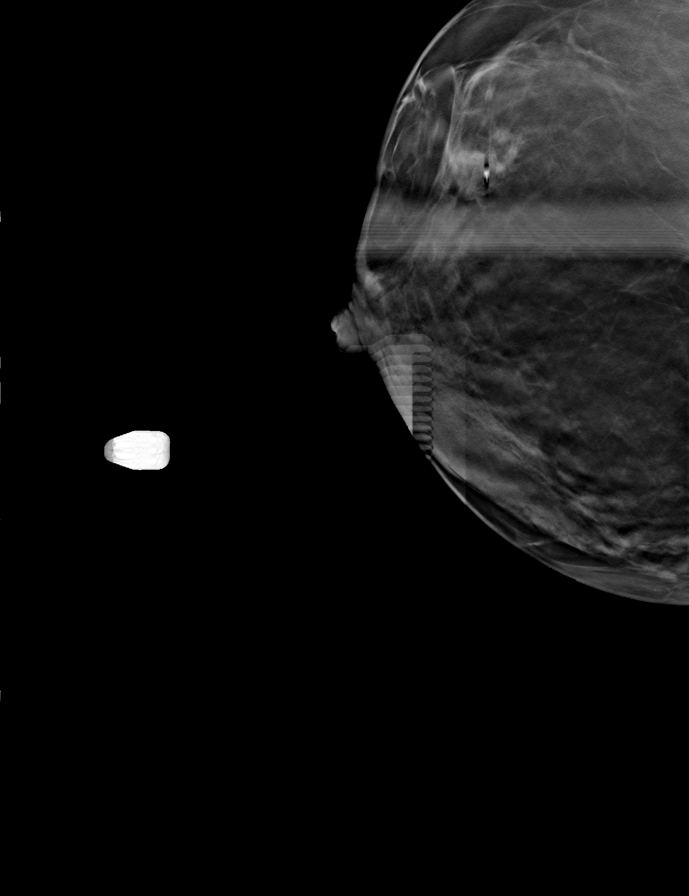

[L CC tomo (3 of 3) · tomo slice 24/47.0]
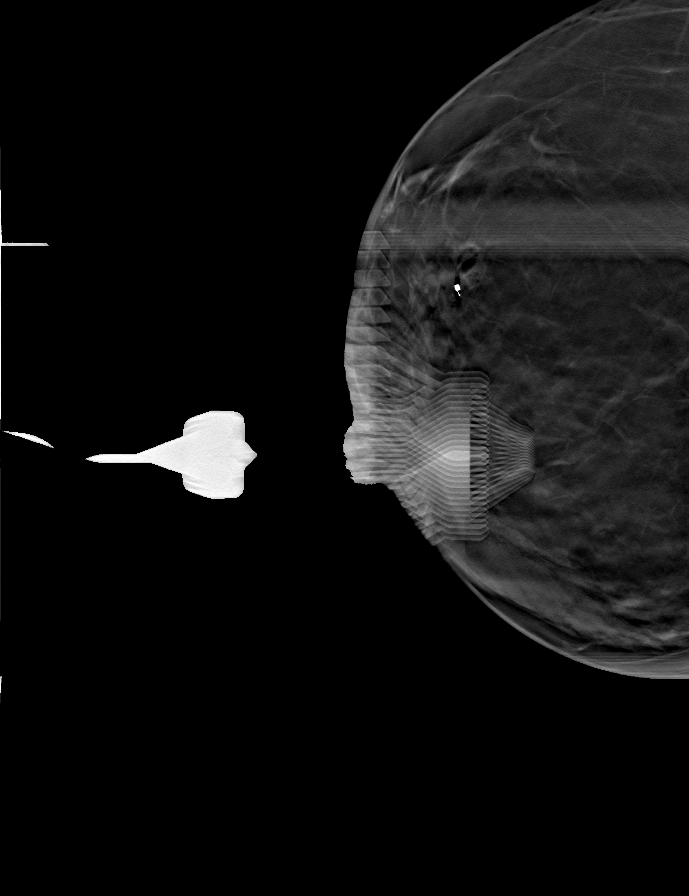

[7 of 19 positions shown; findings below may reference images not displayed]



Site 1: Upper inner left breast anterior depth: Coil shaped clip.

Using sterile technique and 1% Lidocaine as local anesthetic, under
stereotactic guidance, a 9 gauge vacuum assisted device was used to
perform core needle biopsy of calcifications within the upper inner
left breast using a cranial approach. Specimen radiograph was
performed showing calcifications. Specimens with calcifications are
identified for pathology.

Lesion quadrant: Upper inner quadrant

At the conclusion of the procedure, a coil shaped tissue marker clip
was deployed into the biopsy cavity. Follow-up 2-view mammogram was
performed and dictated separately.

Site 2: Upper-outer left breast anterior depth: X shaped clip.

Using sterile technique and 1% Lidocaine as local anesthetic, under
stereotactic guidance, a 9 gauge vacuum assisted device was used to
perform core needle biopsy of calcifications within the upper-outer
left breast using a cranial approach. Specimen radiograph was
performed showing calcifications. Specimens with calcifications are
identified for pathology.

Lesion quadrant: Upper outer quadrant

At the conclusion of the procedure, a X shaped tissue marker clip
was deployed into the biopsy cavity. Follow-up 2-view mammogram was
performed and dictated separately.
IMPRESSION: Stereotactic-guided biopsy of left breast calcifications, 2 sites.
No apparent complications.

ADDENDUM:
Pathology revealed GRADE II INVASIVE MAMMARY CARCINOMA, MAMMARY
CARCINOMA IN SITU, LYMPHOVASCULAR SPACE INVASION PRESENT of the Left
breast, upper inner.

HIGH GRADE DUCTAL CARCINOMA IN SITU, CALCIFICATIONS of the Left
breast, upper outer. This was found to be concordant by Dr. Moatshe
Caridad.

Pathology results were discussed with the patient by telephone. The
patient reported doing well after the biopsies with tenderness at
the sites. Post biopsy instructions and care were reviewed and
questions were answered. The patient was encouraged to call The

The patient was referred to [REDACTED]
[REDACTED] at [REDACTED] on
September 04, 2018.

Pathology results reported by Niizar Roch, RN on 08/30/2018.



Site 1: Upper inner left breast anterior depth: Coil shaped clip.

Using sterile technique and 1% Lidocaine as local anesthetic, under
stereotactic guidance, a 9 gauge vacuum assisted device was used to
perform core needle biopsy of calcifications within the upper inner
left breast using a cranial approach. Specimen radiograph was
performed showing calcifications. Specimens with calcifications are
identified for pathology.

Lesion quadrant: Upper inner quadrant

At the conclusion of the procedure, a coil shaped tissue marker clip
was deployed into the biopsy cavity. Follow-up 2-view mammogram was
performed and dictated separately.

Site 2: Upper-outer left breast anterior depth: X shaped clip.

Using sterile technique and 1% Lidocaine as local anesthetic, under
stereotactic guidance, a 9 gauge vacuum assisted device was used to
perform core needle biopsy of calcifications within the upper-outer
left breast using a cranial approach. Specimen radiograph was
performed showing calcifications. Specimens with calcifications are
identified for pathology.

Lesion quadrant: Upper outer quadrant

At the conclusion of the procedure, a X shaped tissue marker clip
was deployed into the biopsy cavity. Follow-up 2-view mammogram was
performed and dictated separately.
IMPRESSION: Stereotactic-guided biopsy of left breast calcifications, 2 sites.
No apparent complications.

## 2019-09-04 NOTE — Progress Notes (Signed)
Chief Complaint:   OBESITY Roberta [name] is here to discuss her progress with her obesity treatment plan along with follow-up of her obesity related diagnoses. Roberta [name] is on the Category 3 Plan and states she is following her eating plan approximately 95% of the time. Roberta [name] states she is walking steps at school 5 times per week.  Today's visit was #: 33 Starting weight: 292 lbs Starting date: 10/08/2017 Today's weight: 266 lbs Today's date: 09/04/2019 Total lbs lost to date: 26 Total lbs lost since last in-office visit: 3  Interim History: Roberta [name] voices that the last few weeks she has really recommitted to the Category 3. She is eating everything on the plan and denies any hunger. She does report some sweet cravings, and she is doing apple and mini Belvita cookies.  Subjective:   1. Essential hypertension Roberta [name]'s blood pressure is well controlled today. She denies chest pain, chest pressure, or headache. Roberta [name] stopped losartan due to feeling faint.  2. Depression with anxiety Roberta [name] denies suicidal or homicidal ideas. She switched from Zoloft to Effexor.  3. Pre-diabetes Roberta [name] is on metformin with only occasional GI upset if she eats too many carbohydrates.  Assessment/Plan:   1. Essential hypertension Roberta [name] is working on healthy weight loss and exercise to improve blood pressure control. We will watch for signs of hypotension as she continues her lifestyle modifications. We will follow up on her blood pressure at her next appointment.  2. Depression with anxiety Behavior modification techniques were discussed today to help Roberta [name] deal with her emotional/non-hunger eating behaviors. We will follow up at her next appointment. Orders and follow up as documented in patient record.   3. Pre-diabetes Roberta [name] will continue to work on weight loss, exercise, and decreasing simple carbohydrates to help decrease the risk of diabetes. We will refill metformin 500 mg PO daily #30 for 1 month.  4.  Class 3 severe obesity with serious comorbidity and body mass index (BMI) of 40.0 to 44.9 in adult, unspecified obesity type (HCC) Roberta [name] is currently in the action stage of change. As such, her goal is to continue with weight loss efforts. She has agreed to the Category 3 Plan.   Exercise goals: All adults should avoid inactivity. Some physical activity is better than none, and adults who participate in any amount of physical activity gain some health benefits.  Behavioral modification strategies: increasing lean protein intake, increasing vegetables, meal planning and cooking strategies, keeping healthy foods in the home and planning for success.  Roberta [name] has agreed to follow-up with our clinic in 2 to 3 weeks. She was informed of the importance of frequent follow-up visits to maximize her success with intensive lifestyle modifications for her multiple health conditions.   Objective:   Blood pressure 121/79, pulse 73, temperature 98 F (36.7 C), temperature source Oral, height 5\' 6"  (1.676 m), weight 266 lb (120.7 kg), SpO2 98 %. Body mass index is 42.93 kg/m.  General: Cooperative, alert, well developed, in no acute distress. HEENT: Conjunctivae and lids unremarkable. Cardiovascular: Regular rhythm.  Lungs: Normal work of breathing. Neurologic: No focal deficits.   Lab Results  Component Value Date   CREATININE 0.84 08/07/2019   BUN 18 08/07/2019   NA 135 08/07/2019   K 3.9 08/07/2019   CL 99 08/07/2019   CO2 23 08/07/2019   Lab Results  Component Value Date   ALT 12 08/07/2019   AST 18 08/07/2019   ALKPHOS 75 08/07/2019   BILITOT 0.2 08/07/2019  Lab Results  Component Value Date   HGBA1C 5.9 (H) 08/07/2019   HGBA1C 6.4 (H) 01/27/2019   HGBA1C 6.0 (H) 09/25/2018   HGBA1C 5.9 (H) 07/17/2018   HGBA1C 6.1 (H) 01/31/2018   Lab Results  Component Value Date   INSULIN 7.2 08/07/2019   INSULIN 13.4 01/31/2018   INSULIN 10.6 10/08/2017   Lab Results  Component Value Date    TSH 1.480 10/08/2017   Lab Results  Component Value Date   CHOL 138 08/07/2019   HDL 69 08/07/2019   LDLCALC 54 08/07/2019   TRIG 78 08/07/2019   CHOLHDL 2 06/25/2017   Lab Results  Component Value Date   WBC 5.3 01/30/2019   HGB 11.4 (L) 01/30/2019   HCT 35.8 (L) 01/30/2019   MCV 81.0 01/30/2019   PLT 241 01/30/2019   Lab Results  Component Value Date   IRON 162 (H) 01/22/2017   FERRITIN 14.5 01/22/2017   Attestation Statements:   Reviewed by clinician on day of visit: allergies, medications, problem list, medical history, surgical history, family history, social history, and previous encounter notes.  Time spent on visit including pre-visit chart review and post-visit care and charting was 15 minutes.    I, Trixie Dredge, am acting as transcriptionist for Coralie Common, MD.  I have reviewed the above documentation for accuracy and completeness, and I agree with the above. - Jinny Blossom, MD

## 2019-09-08 IMAGING — US US AXILLARY LEFT
1 series · 5 of 5 positions shown · non-contrast
Comparison: Priors

CLINICAL DATA: Patient with recent diagnosis of left breast
invasive mammary carcinoma and DCIS. For evaluation of the left
axilla.

EXAM:
ULTRASOUND OF THE LEFT AXILLA

[Series 1: us axillary left · 0.07mm/px · 5 of 5 slices shown]
[im 1/5]
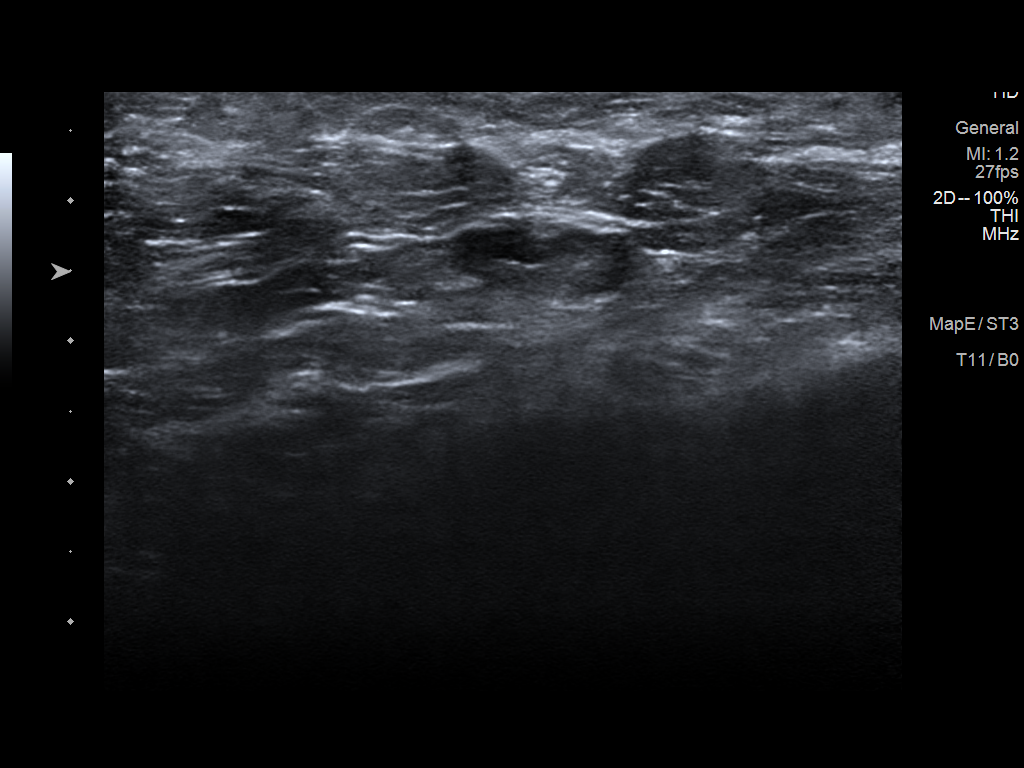
[im 2/5]
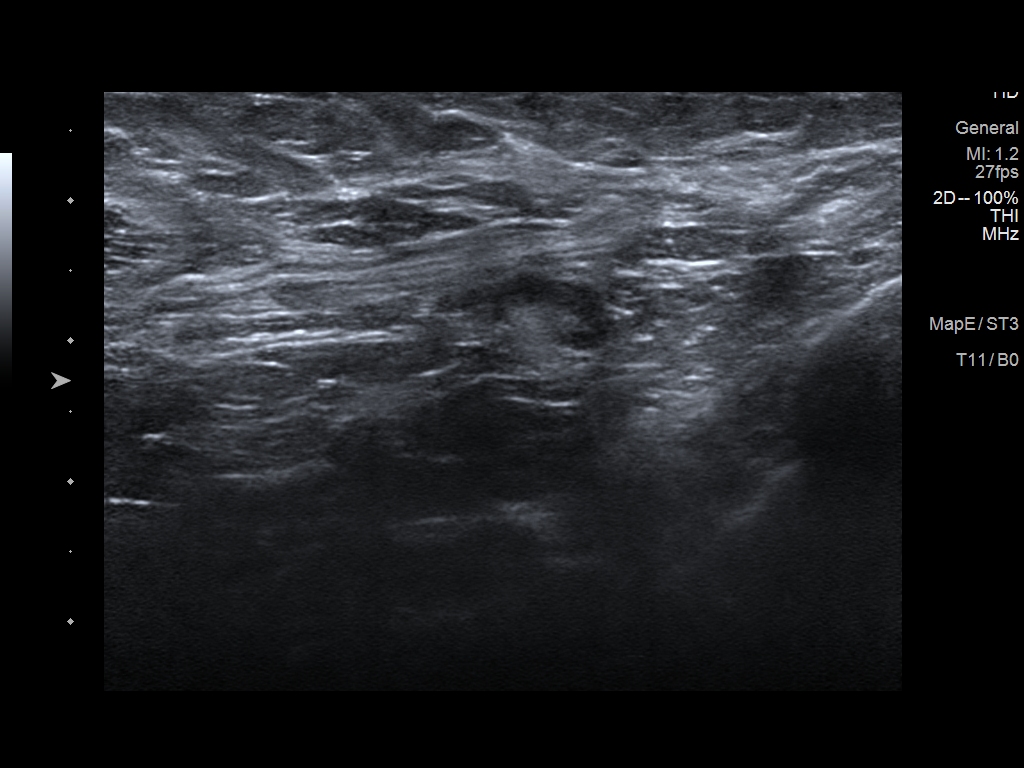
[im 3/5]
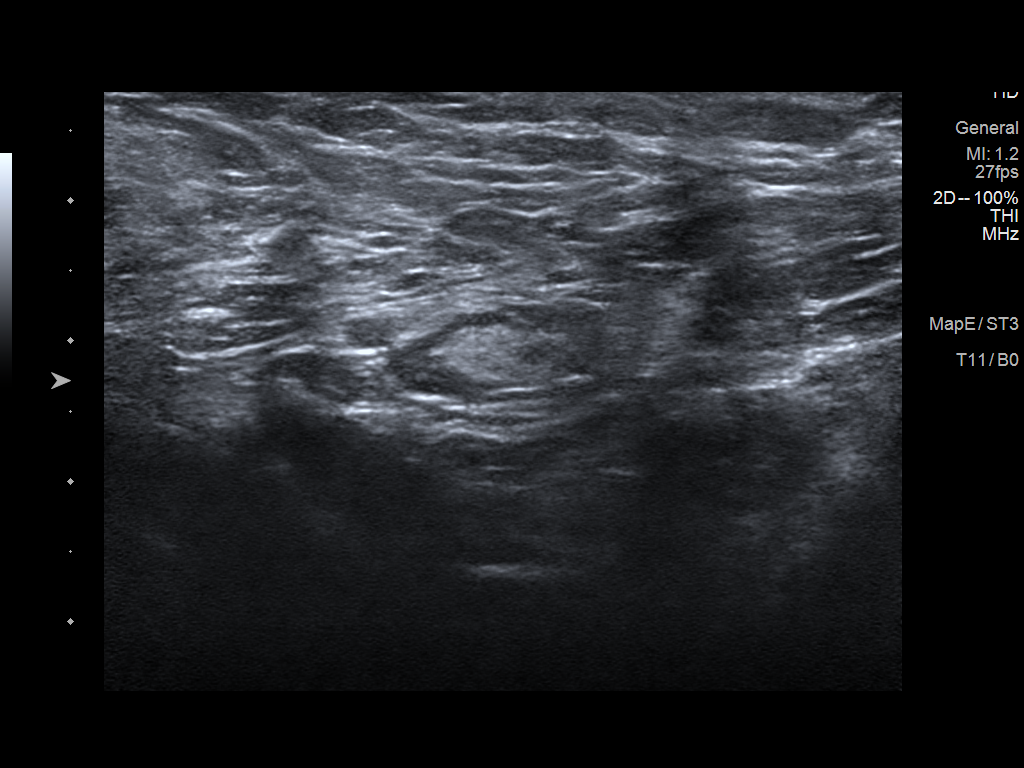
[im 4/5]
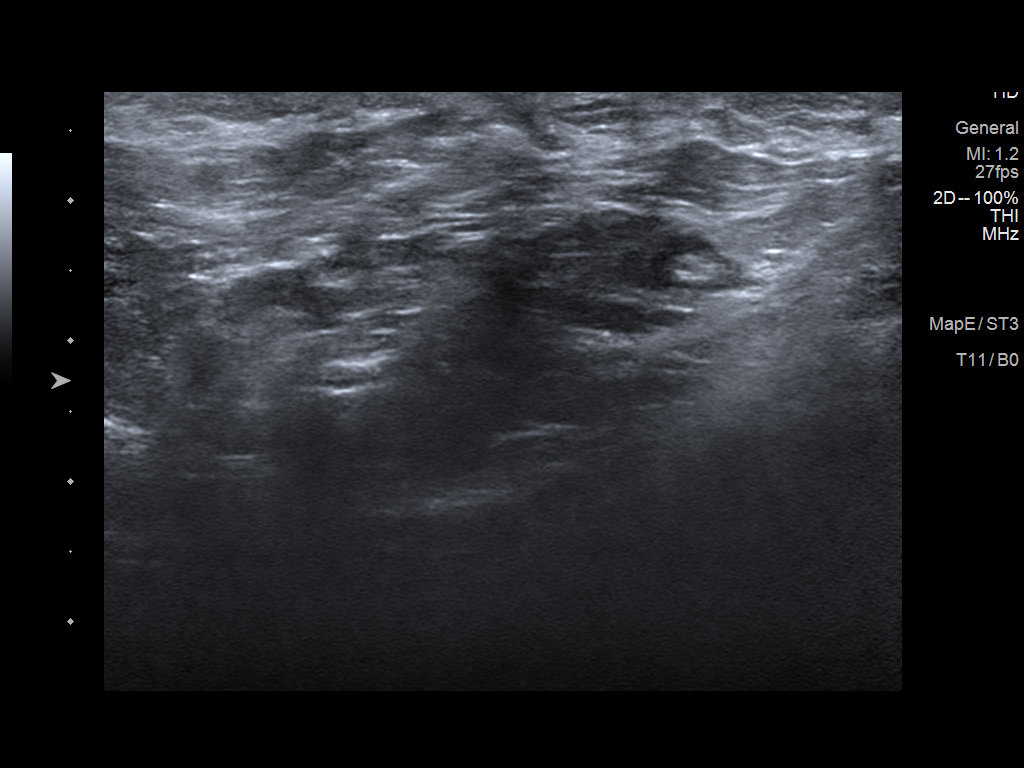
[im 5/5]
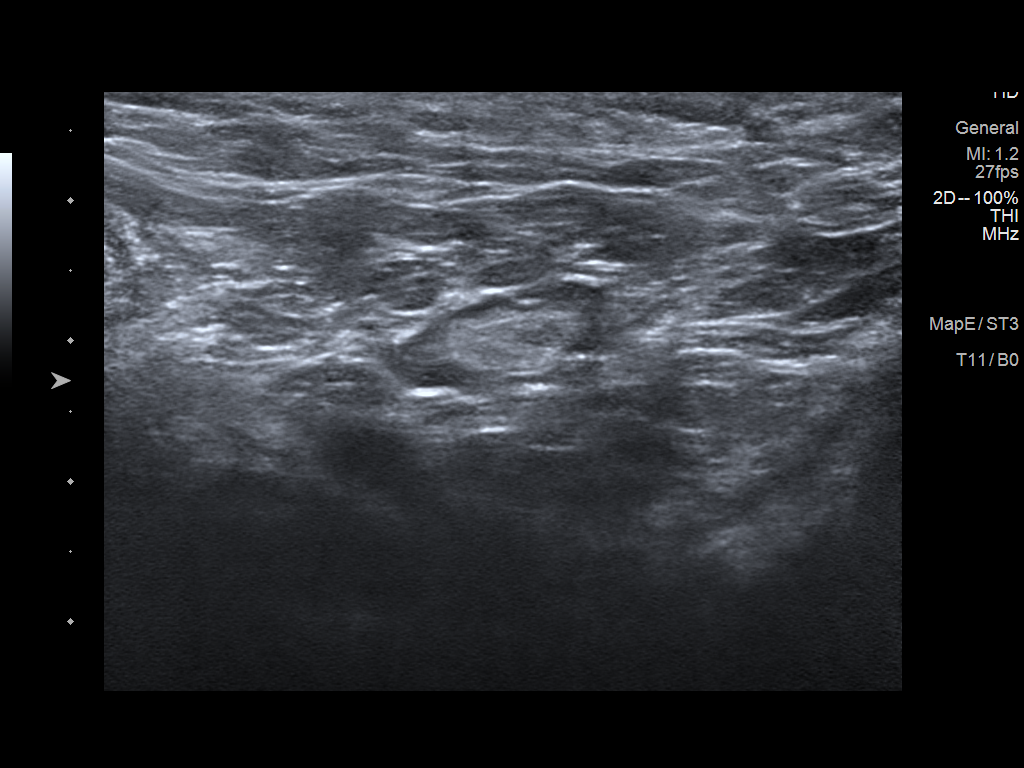

[5 of 5 positions shown; findings below may reference images not displayed]

FINDINGS: Ultrasound is performed, showing no cortically thickened lymph node
within the left axilla.
IMPRESSION: No left axillary adenopathy.

RECOMMENDATION:
Treatment plan for left breast malignancy.

I have discussed the findings and recommendations with the patient.
Results were also provided in writing at the conclusion of the
visit. If applicable, a reminder letter will be sent to the patient
regarding the next appointment.

BI-RADS CATEGORY  6: Known biopsy-proven malignancy.

## 2019-09-10 ENCOUNTER — Other Ambulatory Visit (INDEPENDENT_AMBULATORY_CARE_PROVIDER_SITE_OTHER): Payer: Self-pay | Admitting: Family Medicine

## 2019-09-10 DIAGNOSIS — F418 Other specified anxiety disorders: Secondary | ICD-10-CM

## 2019-09-10 DIAGNOSIS — I10 Essential (primary) hypertension: Secondary | ICD-10-CM

## 2019-09-10 DIAGNOSIS — R7303 Prediabetes: Secondary | ICD-10-CM

## 2019-09-10 DIAGNOSIS — Z6841 Body Mass Index (BMI) 40.0 and over, adult: Secondary | ICD-10-CM

## 2019-09-10 MED ORDER — METFORMIN HCL 500 MG PO TABS: 500.0000 mg | ORAL_TABLET | Freq: Every morning | ORAL | 0 refills | Status: DC

## 2019-09-10 MED ORDER — HYDROCHLOROTHIAZIDE 12.5 MG PO TABS: 12.5000 mg | ORAL_TABLET | Freq: Every day | ORAL | 0 refills | Status: DC

## 2019-09-16 ENCOUNTER — Ambulatory Visit (INDEPENDENT_AMBULATORY_CARE_PROVIDER_SITE_OTHER): Payer: BC Managed Care – PPO | Admitting: Family Medicine

## 2019-09-16 ENCOUNTER — Other Ambulatory Visit: Payer: Self-pay

## 2019-09-16 ENCOUNTER — Encounter (INDEPENDENT_AMBULATORY_CARE_PROVIDER_SITE_OTHER): Payer: Self-pay | Admitting: Family Medicine

## 2019-09-16 VITALS — BP 131/82 | HR 76 | Temp 98.0°F | Ht 66.0 in | Wt 265.0 lb

## 2019-09-16 DIAGNOSIS — F418 Other specified anxiety disorders: Secondary | ICD-10-CM

## 2019-09-16 DIAGNOSIS — Z9189 Other specified personal risk factors, not elsewhere classified: Secondary | ICD-10-CM | POA: Diagnosis not present

## 2019-09-16 DIAGNOSIS — Z6841 Body Mass Index (BMI) 40.0 and over, adult: Secondary | ICD-10-CM

## 2019-09-16 DIAGNOSIS — I10 Essential (primary) hypertension: Secondary | ICD-10-CM

## 2019-09-16 MED ORDER — VORTIOXETINE HBR 10 MG PO TABS: 10.0000 mg | ORAL_TABLET | Freq: Every day | ORAL | 0 refills | Status: DC

## 2019-09-16 NOTE — Progress Notes (Signed)
Chief Complaint:   OBESITY Roberta [name] is here to discuss her progress with her obesity treatment plan along with follow-up of her obesity related diagnoses. Roberta [name] is on the Category 3 Plan and states she is following her eating plan approximately 100% of the time. Roberta [name] states she is at the gym 2 times per week.  Today's visit was #: 57 Starting weight: 292 lbs Starting date: 10/08/2017 Today's weight: 265 lbs Today's date: 09/16/2019 Total lbs lost to date: 27 Total lbs lost since last in-office visit: 1  Interim History: Roberta [name] has realized the gym for her is only a weekend option given her schedule. The past few weeks she has learned  To make fajitas which she is still eating. She notes work has been stressful. She feels emotionally spiraling. She doesn't like to take her mask off to eat when her kids are eating.  Subjective:   1. Essential hypertension Roberta [name]'s blood pressure is controlled today. She denies chest pain, chest pressure, or headache. She is on hydrochlorothiazide 12.5 mg daily.  2. Depression with anxiety Roberta [name] denies suicidal or homicidal ideas. She reports she feels she is spiraling emotionally, and she feels she is struggling. She recently switched to Effexor.  3. At risk for anxiety Roberta [name] is at risk of developing anxiety due to change in medications.  Assessment/Plan:   1. Essential hypertension Roberta [name] is working on healthy weight loss and exercise to improve blood pressure control. We will watch for signs of hypotension as she continues her lifestyle modifications. We will recheck her blood pressure at her next appointment, and no change in medication dose.  2. Depression with anxiety Behavior modification techniques were discussed today to help Roberta [name] deal with her depression and anxiety. Roberta [name] agreed to discontinue Effexor and start Trintellix 10 mg PO daily with no refills. Orders and follow up as documented in patient record.   - vortioxetine HBr (TRINTELLIX)  10 MG TABS tablet; Take 1 tablet (10 mg total) by mouth daily.  Dispense: 30 tablet; Refill: 0  3. At risk for anxiety Roberta [name] was given approximately 15 minutes of anxiety risk counseling today. She has risk factors for anxiety including stress. We discussed the importance of a healthy work life balance, a healthy relationship with food and a good support system.  Repetitive spaced learning was employed today to elicit superior memory formation and behavioral change.  4. Class 3 severe obesity with serious comorbidity and body mass index (BMI) of 40.0 to 44.9 in adult, unspecified obesity type (HCC) Roberta [name] is currently in the action stage of change. As such, her goal is to continue with weight loss efforts. She has agreed to the Category 3 Plan.   Roberta [name] is to change lunch by eating more at breakfast, substituting with shake and granola bar for lunch and then eating dinner.  Exercise goals: All adults should avoid inactivity. Some physical activity is better than none, and adults who participate in any amount of physical activity gain some health benefits.  Behavioral modification strategies: increasing lean protein intake, meal planning and cooking strategies, keeping healthy foods in the home and planning for success.  Roberta [name] has agreed to follow-up with our clinic in 2 weeks. She was informed of the importance of frequent follow-up visits to maximize her success with intensive lifestyle modifications for her multiple health conditions.   Objective:   Blood pressure 131/82, pulse 76, temperature 98 F (36.7 C), temperature source Oral, height 5\' 6"  (1.676 m), weight 265 lb (120.2 kg), SpO2  96 %. Body mass index is 42.77 kg/m.  General: Cooperative, alert, well developed, in no acute distress. HEENT: Conjunctivae and lids unremarkable. Cardiovascular: Regular rhythm.  Lungs: Normal work of breathing. Neurologic: No focal deficits.   Lab Results  Component Value Date   CREATININE 0.84  08/07/2019   BUN 18 08/07/2019   NA 135 08/07/2019   K 3.9 08/07/2019   CL 99 08/07/2019   CO2 23 08/07/2019   Lab Results  Component Value Date   ALT 12 08/07/2019   AST 18 08/07/2019   ALKPHOS 75 08/07/2019   BILITOT 0.2 08/07/2019   Lab Results  Component Value Date   HGBA1C 5.9 (H) 08/07/2019   HGBA1C 6.4 (H) 01/27/2019   HGBA1C 6.0 (H) 09/25/2018   HGBA1C 5.9 (H) 07/17/2018   HGBA1C 6.1 (H) 01/31/2018   Lab Results  Component Value Date   INSULIN 7.2 08/07/2019   INSULIN 13.4 01/31/2018   INSULIN 10.6 10/08/2017   Lab Results  Component Value Date   TSH 1.480 10/08/2017   Lab Results  Component Value Date   CHOL 138 08/07/2019   HDL 69 08/07/2019   LDLCALC 54 08/07/2019   TRIG 78 08/07/2019   CHOLHDL 2 06/25/2017   Lab Results  Component Value Date   WBC 5.3 01/30/2019   HGB 11.4 (L) 01/30/2019   HCT 35.8 (L) 01/30/2019   MCV 81.0 01/30/2019   PLT 241 01/30/2019   Lab Results  Component Value Date   IRON 162 (H) 01/22/2017   FERRITIN 14.5 01/22/2017   Attestation Statements:   Reviewed by clinician on day of visit: allergies, medications, problem list, medical history, surgical history, family history, social history, and previous encounter notes.   I, Trixie Dredge, am acting as transcriptionist for Coralie Common, MD.  I have reviewed the above documentation for accuracy and completeness, and I agree with the above. - Jinny Blossom, MD

## 2019-09-17 ENCOUNTER — Encounter: Payer: Self-pay | Admitting: Internal Medicine

## 2019-09-23 ENCOUNTER — Other Ambulatory Visit: Payer: Self-pay

## 2019-09-23 ENCOUNTER — Inpatient Hospital Stay (HOSPITAL_BASED_OUTPATIENT_CLINIC_OR_DEPARTMENT_OTHER): Payer: BC Managed Care – PPO | Admitting: Oncology

## 2019-09-23 ENCOUNTER — Inpatient Hospital Stay: Payer: BC Managed Care – PPO | Attending: Oncology

## 2019-09-23 VITALS — BP 147/106 | HR 67 | Temp 97.7°F | Resp 18 | Ht 66.0 in | Wt 269.5 lb

## 2019-09-23 DIAGNOSIS — F329 Major depressive disorder, single episode, unspecified: Secondary | ICD-10-CM | POA: Insufficient documentation

## 2019-09-23 DIAGNOSIS — E669 Obesity, unspecified: Secondary | ICD-10-CM | POA: Diagnosis not present

## 2019-09-23 DIAGNOSIS — Z7984 Long term (current) use of oral hypoglycemic drugs: Secondary | ICD-10-CM | POA: Insufficient documentation

## 2019-09-23 DIAGNOSIS — Z801 Family history of malignant neoplasm of trachea, bronchus and lung: Secondary | ICD-10-CM | POA: Diagnosis not present

## 2019-09-23 DIAGNOSIS — Z6841 Body Mass Index (BMI) 40.0 and over, adult: Secondary | ICD-10-CM

## 2019-09-23 DIAGNOSIS — Z79810 Long term (current) use of selective estrogen receptor modulators (SERMs): Secondary | ICD-10-CM | POA: Insufficient documentation

## 2019-09-23 DIAGNOSIS — Z9012 Acquired absence of left breast and nipple: Secondary | ICD-10-CM | POA: Diagnosis not present

## 2019-09-23 DIAGNOSIS — Z79899 Other long term (current) drug therapy: Secondary | ICD-10-CM | POA: Diagnosis not present

## 2019-09-23 DIAGNOSIS — C50212 Malignant neoplasm of upper-inner quadrant of left female breast: Secondary | ICD-10-CM | POA: Insufficient documentation

## 2019-09-23 DIAGNOSIS — Z170 Estrogen receptor positive status [ER+]: Secondary | ICD-10-CM

## 2019-09-23 DIAGNOSIS — D509 Iron deficiency anemia, unspecified: Secondary | ICD-10-CM | POA: Diagnosis not present

## 2019-09-23 DIAGNOSIS — I10 Essential (primary) hypertension: Secondary | ICD-10-CM | POA: Insufficient documentation

## 2019-09-23 DIAGNOSIS — Z833 Family history of diabetes mellitus: Secondary | ICD-10-CM | POA: Insufficient documentation

## 2019-09-23 DIAGNOSIS — R232 Flushing: Secondary | ICD-10-CM | POA: Insufficient documentation

## 2019-09-23 DIAGNOSIS — E559 Vitamin D deficiency, unspecified: Secondary | ICD-10-CM

## 2019-09-23 DIAGNOSIS — Z9884 Bariatric surgery status: Secondary | ICD-10-CM | POA: Insufficient documentation

## 2019-09-23 DIAGNOSIS — Z8249 Family history of ischemic heart disease and other diseases of the circulatory system: Secondary | ICD-10-CM | POA: Diagnosis not present

## 2019-09-23 DIAGNOSIS — Z8349 Family history of other endocrine, nutritional and metabolic diseases: Secondary | ICD-10-CM | POA: Diagnosis not present

## 2019-09-23 LAB — CBC WITH DIFFERENTIAL/PLATELET
Abs Immature Granulocytes: 0.01 10*3/uL (ref 0.00–0.07)
Basophils Absolute: 0.1 10*3/uL (ref 0.0–0.1)
Basophils Relative: 1 %
Eosinophils Absolute: 0.1 10*3/uL (ref 0.0–0.5)
Eosinophils Relative: 3 %
HCT: 37 % (ref 36.0–46.0)
Hemoglobin: 11.6 g/dL — ABNORMAL LOW (ref 12.0–15.0)
Immature Granulocytes: 0 %
Lymphocytes Relative: 36 %
Lymphs Abs: 1.7 10*3/uL (ref 0.7–4.0)
MCH: 25.7 pg — ABNORMAL LOW (ref 26.0–34.0)
MCHC: 31.4 g/dL (ref 30.0–36.0)
MCV: 82 fL (ref 80.0–100.0)
Monocytes Absolute: 0.5 10*3/uL (ref 0.1–1.0)
Monocytes Relative: 11 %
Neutro Abs: 2.3 10*3/uL (ref 1.7–7.7)
Neutrophils Relative %: 49 %
Platelets: 233 10*3/uL (ref 150–400)
RBC: 4.51 MIL/uL (ref 3.87–5.11)
RDW: 16.7 % — ABNORMAL HIGH (ref 11.5–15.5)
WBC: 4.7 10*3/uL (ref 4.0–10.5)
nRBC: 0 % (ref 0.0–0.2)

## 2019-09-23 LAB — COMPREHENSIVE METABOLIC PANEL
ALT: 13 U/L (ref 0–44)
AST: 21 U/L (ref 15–41)
Albumin: 3 g/dL — ABNORMAL LOW (ref 3.5–5.0)
Alkaline Phosphatase: 59 U/L (ref 38–126)
Anion gap: 8 (ref 5–15)
BUN: 19 mg/dL (ref 6–20)
CO2: 27 mmol/L (ref 22–32)
Calcium: 8.6 mg/dL — ABNORMAL LOW (ref 8.9–10.3)
Chloride: 106 mmol/L (ref 98–111)
Creatinine, Ser: 0.98 mg/dL (ref 0.44–1.00)
GFR calc Af Amer: >60 mL/min (ref >60)
GFR calc non Af Amer: >60 mL/min (ref >60)
Glucose, Bld: 69 mg/dL — ABNORMAL LOW (ref 70–99)
Potassium: 3.8 mmol/L (ref 3.5–5.1)
Sodium: 141 mmol/L (ref 135–145)
Total Bilirubin: 0.3 mg/dL (ref 0.3–1.2)
Total Protein: 7.2 g/dL (ref 6.5–8.1)

## 2019-09-23 MED ORDER — TAMOXIFEN CITRATE 20 MG PO TABS: 20.0000 mg | ORAL_TABLET | Freq: Every day | ORAL | 4 refills | Status: DC

## 2019-09-23 MED ORDER — VENLAFAXINE HCL ER 75 MG PO CP24: 75.0000 mg | ORAL_CAPSULE | Freq: Every day | ORAL | 4 refills | Status: DC

## 2019-09-23 MED ORDER — GABAPENTIN 300 MG PO CAPS: 300.0000 mg | ORAL_CAPSULE | Freq: Every day | ORAL | 4 refills | Status: DC

## 2019-09-23 NOTE — Progress Notes (Signed)
[place]  [phone] [phone]    ID: Casie Sturgeon DOB: 11/16/1965  MR#: [id number]  IZT#:[id number]  Patient Care Team: [name], MD as PCP - General (Internal Medicine) Castulo Scarpelli, Virgie Dad, MD as Consulting Physician (Oncology) Kyung Rudd, MD as Consulting Physician (Radiation Oncology) Juanita Craver, MD as Consulting Physician (Gastroenterology) Sherlyn Hay, DO as Consulting Physician (Obstetrics and Gynecology) OTHER MD:   CHIEF COMPLAINT: Estrogen receptor positive breast cancer (s/p left mastectomy)  CURRENT TREATMENT: tamoxifen   INTERVAL HISTORY: Elexus returns today for follow up of her estrogen receptor positive breast cancer.  She continues on tamoxifen. She also takes gabapentin, which has helped the hot flashes considerably.  It also helped a little bit of tingling she was having in her fingers.  Since her last visit, she underwent right screening mammography with tomography at [facility] on 07/28/2019 showing: breast density category C; no evidence of malignancy.    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  She is there from 9 in the morning to about 6 in the evening most days and when she gets home she does not have a lot of time more energy left to exercise.  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  She herself did receive the Pfizer vaccine x2-second dose in March.  She had an episode where she really lost it called 911 with a panic attack thinking that she was having a heart attack.  Her Zoloft was changed to Effexor but very low dose and she had no effect for that.  She was prescribed Trintellix but she is very reluctant to go on that because of cost issues.  Aside from these issues a detailed review of systems today was stable   HISTORY OF CURRENT ILLNESS: From the original intake note:  Leiah Giannotti had routine screening mammography on 07/25/2018 which  was normal.  Less than a month later she was found by Dr. Terri Piedra to have palpable lumps in the superior left breast at a clinic exam.  Shenoa underwent unilateral left diagnostic mammography with tomography and left breast ultrasonography at [facility] on 08/23/2018 showing: Breast Density Category C. No suspicious masses are seen on the tomosynthesis images of the left breast. There are 2 groups of indeterminate calcifications however, one in the superior anterior superficial left breast which spans 8 mm. In the upper inner left breast, there is a 2.2 cm area of loosely grouped amorphous calcifications. Normal fibroglandular tissue in the upper inner and upper outer quadrants of the left breast.  There were no sonographic abnormalities to correspond with the masses identified on the clinical breast exam.  Accordingly on 08/29/2018 she proceeded to stereotactic biopsy of the left breast area in question. The pathology from this procedure showed ([id number]): invasive mammary carcinoma, nottingham grade 2 of 3. Prognostic indicators significant for: estrogen receptor, 100% positive and progesterone receptor, 2% positive, both with strong staining intensity. Proliferation marker Ki67 at 10%. HER2 negative (0+) by immunohistochemistry.  An additional biopsy of the left breast was performed on the same day. The pathology from this procedure showed ([id number]): ductal carcinoma in situ. Prognostic indicators significant for: estrogen receptor, 95% positie and progesterone receptor, 100% positive, both with strong staining intensity.  Finally, she underwent a left axillary ultrasound on 09/02/2018 showing: No left axillary adenopathy.  The patient's subsequent history is as detailed below.   PAST MEDICAL HISTORY: Past Medical History:  Diagnosis Date  . Back pain   . Cancer [facility])    multifocal left breast  . Depression   . Hip pain   . Hypertension   . Obesity   . PONV (postoperative nausea and  vomiting)   . Prediabetes   . Swelling    bilat LE  . Wears glasses     PAST SURGICAL HISTORY: Past Surgical History:  Procedure Laterality Date  . CHOLECYSTECTOMY    . COLONOSCOPY    . DILATION AND CURETTAGE OF UTERUS    . GASTRIC BYPASS    . KNEE SURGERY  2002   Ligament repair  . MASTECTOMY Left 2019  . MASTECTOMY W/ SENTINEL NODE BIOPSY Left 10/01/2018   Procedure: LEFT TOTAL MASTECTOMY WITH SENTINEL LYMPH NODE BIOPSY AND BLUE DYE INJECTION;  Surgeon: Fanny Skates, MD;  Location: [facility];  Service: General;  Laterality: Left;    FAMILY HISTORY: Family History  Problem Relation Age of Onset  . Diabetes Mother   . Hypertension Mother   . Heart disease Mother   . Stroke Mother   . Kidney disease Mother   . Obesity Mother   . Cancer Maternal Aunt        lung  . Cancer Maternal Uncle        lung  . Throat cancer Maternal Aunt    Jayln's father died in his mid 59s from alcoholism.  The patient's mother died at age 31 from end-stage renal disease.  The patient had 2 brothers, 3 sisters, one brother dying from complications of diabetes.  There is no cancer in the immediate family.  There are 2 maternal uncles and 2 maternal aunts who died from lung cancer, all of whom smoked.  A maternal aunt also had a history of throat cancer.   GYNECOLOGIC HISTORY:  No LMP recorded. (Menstrual status: Perimenopausal). 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  She is divorced, lives alone.   ADVANCED DIRECTIVES: She intends to name her sister [name] as healthcare power of attorney.  [name] can be reached at [phone].   HEALTH MAINTENANCE: Social History   Tobacco Use  . Smoking status: Never Smoker  .  Smokeless tobacco: Never Used  Vaping Use  . Vaping Use: Never used  Substance Use Topics  . Alcohol use: Yes    Comment: occasional wine  . Drug use: No    Colonoscopy: August 2020  PAP: Up-to-date  Bone density: 2005   Allergies  Allergen Reactions  . Lisinopril Swelling    Swelling of the tongue  . Penicillins     UNSPECIFIED CHILDHOOD REACTION  . Norvasc [Amlodipine Besylate] Rash  . Red Dye Rash  . Tetanus Toxoids Swelling    Swelling at site of injection    Current Outpatient Medications  Medication Sig Dispense Refill  . gabapentin (NEURONTIN) 300 MG capsule Take 1 capsule (300 mg total) by mouth at bedtime. 90 capsule 4  . hydrochlorothiazide (HYDRODIURIL) 12.5 MG tablet Take 1 tablet (12.5 mg total) by mouth daily. 30 tablet 0  . metFORMIN (GLUCOPHAGE) 500 MG tablet Take 1 tablet (500 mg total) by mouth every morning. 30 tablet 0  . Multiple Vitamin (MULTIVITAMIN) tablet Take 1 tablet by mouth daily.    Marland Kitchen  mupirocin ointment (BACTROBAN) 2 % Apply 1 application topically 2 (two) times daily as needed. 22 g 0  . tamoxifen (NOLVADEX) 20 MG tablet TAKE 1 TABLET BY MOUTH EVERY DAY 90 tablet 3  . vortioxetine HBr (TRINTELLIX) 10 MG TABS tablet Take 1 tablet (10 mg total) by mouth daily. 30 tablet 0   No current facility-administered medications for this visit.     OBJECTIVE: African-American woman who appears younger than stated age  32:   09/23/19 1459  BP: (!) 147/106  Pulse: 67  Resp: 18  Temp: 97.7 F (36.5 C)  SpO2: 100%   Wt Readings from Last 3 Encounters:  09/23/19 269 lb 8 oz (122.2 kg)  09/16/19 265 lb (120.2 kg)  09/04/19 266 lb (120.7 kg)   Body mass index is 43.5 kg/m.    ECOG FS:1 - Symptomatic but completely ambulatory  Sclerae unicteric, EOMs intact Wearing a mask No cervical or supraclavicular adenopathy Lungs no rales or rhonchi Heart regular rate and rhythm Abd soft, nontender, positive bowel sounds MSK no focal spinal  tenderness, no upper extremity lymphedema Neuro: nonfocal, well oriented, appropriate affect Breasts: The right breast is unremarkable.  The left breast is status post mastectomy.  There is no evidence of local recurrence.  Both axillae are benign.   LAB RESULTS:  CMP     Component Value Date/Time   NA 135 08/07/2019 1432   K 3.9 08/07/2019 1432   CL 99 08/07/2019 1432   CO2 23 08/07/2019 1432   GLUCOSE 92 08/07/2019 1432   GLUCOSE 140 (H) 01/30/2019 1418   BUN 18 08/07/2019 1432   CREATININE 0.84 08/07/2019 1432   CREATININE 0.95 09/04/2018 0840   CALCIUM 9.1 08/07/2019 1432   PROT 7.0 08/07/2019 1432   ALBUMIN 3.8 08/07/2019 1432   AST 18 08/07/2019 1432   AST 13 (L) 09/04/2018 0840   ALT 12 08/07/2019 1432   ALT 10 09/04/2018 0840   ALKPHOS 75 08/07/2019 1432   BILITOT 0.2 08/07/2019 1432   BILITOT 0.4 09/04/2018 0840   GFRNONAA 78 08/07/2019 1432   GFRNONAA >60 09/04/2018 0840   GFRAA 90 08/07/2019 1432   GFRAA >60 09/04/2018 0840    No results found for: TOTALPROTELP, ALBUMINELP, A1GS, A2GS, BETS, BETA2SER, GAMS, MSPIKE, SPEI  No results found for: KPAFRELGTCHN, LAMBDASER, KAPLAMBRATIO  Lab Results  Component Value Date   WBC 4.7 09/23/2019   NEUTROABS 2.3 09/23/2019   HGB 11.6 (L) 09/23/2019   HCT 37.0 09/23/2019   MCV 82.0 09/23/2019   PLT 233 09/23/2019    No results found for: LABCA2  No components found for: [id number]  No results for input(s): INR in the last 168 hours.  No results found for: LABCA2  No results found for: EXH371  No results found for: IRC789  No results found for: FYB017  No results found for: CA2729  No components found for: HGQUANT  No results found for: CEA1 / No results found for: CEA1   No results found for: AFPTUMOR  No results found for: CHROMOGRNA  No results found for: HGBA, HGBA2QUANT, HGBFQUANT, HGBSQUAN (Hemoglobinopathy evaluation)   No results found for: LDH  Lab Results  Component Value Date    IRON 162 (H) 01/22/2017   (Iron and TIBC)  Lab Results  Component Value Date   FERRITIN 14.5 01/22/2017    Urinalysis    Component Value Date/Time   COLORURINE YELLOW 06/25/2007 0640   APPEARANCEUR CLOUDY (A) 06/25/2007 0640   LABSPEC 1.016 06/25/2007 5102  PHURINE 6.0 06/25/2007 0640   GLUCOSEU NEGATIVE 06/25/2007 0640   HGBUR LARGE (A) 06/25/2007 0640   BILIRUBINUR NEGATIVE 06/25/2007 0640   KETONESUR NEGATIVE 06/25/2007 0640   PROTEINUR 100 (A) 06/25/2007 0640   UROBILINOGEN 0.2 06/25/2007 0640   NITRITE POSITIVE (A) 06/25/2007 0640   LEUKOCYTESUR MODERATE (A) 06/25/2007 0640     STUDIES:  No results found.   ELIGIBLE FOR AVAILABLE RESEARCH PROTOCOL: no   ASSESSMENT: 54 y.o. [place], [place] woman status post left breast upper inner quadrant biopsy for a clinical T2 N0, stage IB invasive ductal carcinoma, grade 2, estrogen and progesterone receptor positive, HER-2 not amplified, with an MIB-1-1 of 2%  (a) a second biopsy same day different quadrant showed ductal carcinoma in situ  (1) left breast s/p mastectomy on 10/01/2018 for a  pT1b pN0, stage IA invasive ductal carcinoma, grade 2, margins negative.  (a) a total of 5 lymph nodes biopsied were negative.    (2) Oncotype score of 20 predicts a risk of recurrence outside the breast within the next 9 years of 6% if her only systemic therapy is antiestrogens for 5 years.  Also predicts no benefit from chemotherapy.  (3) adjuvant radiation not indicated  (4)  tamoxifen started November 2020   PLAN: Zykera is a year out from definitive surgery for breast cancer with no evidence of disease recurrence.  This is favorable.  She is tolerating tamoxifen generally well.  She does have hot flashes.  She adapting to this.  The gabapentin at bedtime does help some.  With all the stress and problems described above she has had some changes in her antidepressants.  She went off Zoloft, started on very low-dose venlafaxine which  did not work, and then they prescribed VORTIOXETINE which however she does not want to get on because of cost issues.    After much discussion we decided since she tolerated the venlafaxine at 37.5 mg quite well, to go to 75 mg on the venlafaxine and see how she does with that.  This has the advantage that it really helps with hot flashes as well.  After a couple of weeks at that dose she will call us and we will consider going to 150 mg at that time  She will be seeing her surgeon in about 3 months.  She will see [place] again in 6 months.  She knows to call for any other issue that may develop before then.  Total encounter time 30 minutes.  Virgie Dad. Janah Mcculloh, MD  09/23/19 3:14 PM Medical Oncology and Hematology [facility] [place], [place] [address] Tel. [phone]    Fax. [phone]    I, Wilburn Mylar, am acting as scribe for Dr. Virgie Dad. Dottie Vaquerano.  I, Lurline Del MD, have reviewed the above documentation for accuracy and completeness, and I agree with the above.   *Total Encounter Time as defined by the Centers for Medicare and Medicaid Services includes, in addition to the face-to-face time of a patient visit (documented in the note above) non-face-to-face time: obtaining and reviewing outside history, ordering and reviewing medications, tests or procedures, care coordination (communications with other health care professionals or caregivers) and documentation in the medical record.

## 2019-09-24 ENCOUNTER — Telehealth: Payer: Self-pay | Admitting: Oncology

## 2019-09-24 NOTE — Telephone Encounter (Signed)
Scheduled appts per 9/21 los. Left voicemail with appt date and time.

## 2019-10-02 ENCOUNTER — Ambulatory Visit (INDEPENDENT_AMBULATORY_CARE_PROVIDER_SITE_OTHER): Payer: BC Managed Care – PPO | Admitting: Family Medicine

## 2019-10-05 ENCOUNTER — Other Ambulatory Visit (INDEPENDENT_AMBULATORY_CARE_PROVIDER_SITE_OTHER): Payer: Self-pay | Admitting: Family Medicine

## 2019-10-05 DIAGNOSIS — I10 Essential (primary) hypertension: Secondary | ICD-10-CM

## 2019-10-05 DIAGNOSIS — R7303 Prediabetes: Secondary | ICD-10-CM

## 2019-10-06 ENCOUNTER — Encounter (INDEPENDENT_AMBULATORY_CARE_PROVIDER_SITE_OTHER): Payer: Self-pay

## 2019-10-06 MED ORDER — HYDROCHLOROTHIAZIDE 12.5 MG PO TABS: 12.5000 mg | ORAL_TABLET | Freq: Every day | ORAL | 0 refills | Status: DC

## 2019-10-06 MED ORDER — METFORMIN HCL 500 MG PO TABS: 500.0000 mg | ORAL_TABLET | Freq: Every morning | ORAL | 0 refills | Status: DC

## 2019-10-15 ENCOUNTER — Encounter: Payer: Self-pay | Admitting: Adult Health

## 2019-10-16 ENCOUNTER — Ambulatory Visit (INDEPENDENT_AMBULATORY_CARE_PROVIDER_SITE_OTHER): Payer: BC Managed Care – PPO | Admitting: Family Medicine

## 2019-10-16 ENCOUNTER — Other Ambulatory Visit: Payer: Self-pay

## 2019-10-16 ENCOUNTER — Encounter: Payer: Self-pay | Admitting: Internal Medicine

## 2019-10-16 ENCOUNTER — Other Ambulatory Visit: Payer: BC Managed Care – PPO

## 2019-10-16 ENCOUNTER — Encounter (INDEPENDENT_AMBULATORY_CARE_PROVIDER_SITE_OTHER): Payer: Self-pay | Admitting: Family Medicine

## 2019-10-16 VITALS — BP 130/86 | HR 76 | Temp 98.9°F | Ht 66.0 in | Wt 266.0 lb

## 2019-10-16 DIAGNOSIS — R7303 Prediabetes: Secondary | ICD-10-CM | POA: Diagnosis not present

## 2019-10-16 DIAGNOSIS — Z20822 Contact with and (suspected) exposure to covid-19: Secondary | ICD-10-CM

## 2019-10-16 DIAGNOSIS — F418 Other specified anxiety disorders: Secondary | ICD-10-CM

## 2019-10-16 DIAGNOSIS — I10 Essential (primary) hypertension: Secondary | ICD-10-CM | POA: Diagnosis not present

## 2019-10-16 DIAGNOSIS — Z6841 Body Mass Index (BMI) 40.0 and over, adult: Secondary | ICD-10-CM

## 2019-10-17 ENCOUNTER — Telehealth: Payer: Self-pay

## 2019-10-17 LAB — SARS-COV-2, NAA 2 DAY TAT

## 2019-10-17 LAB — NOVEL CORONAVIRUS, NAA: SARS-CoV-2, NAA: NOT DETECTED

## 2019-10-17 NOTE — Telephone Encounter (Signed)
She called and left a message that she had sent a message to Wilber Bihari, NP on mychart.  Called back and left message that the message was forwarded to Wilber Bihari, NP yesterday. Mendel Ryder is not in the office today and will not be back in the office until 10/20. Ask her to call the office back.

## 2019-10-21 NOTE — Progress Notes (Signed)
Chief Complaint:   OBESITY Roberta [name] is here to discuss her progress with her obesity treatment plan along with follow-up of her obesity related diagnoses. Roberta [name] is on the Category 3 Plan and states she is following her eating plan approximately 70-80% of the time. Roberta [name] states she is exercising for 0 minutes 0 times per week.  Today's visit was #: 64 Starting weight: 292 lbs Starting date: 10/08/2017 Today's weight: 266 lbs Today's date: 10/16/2019 Total lbs lost to date: 26 lbs Total lbs lost since last in-office visit: 0  Interim History: Roberta [name] says she has been having a significant amount of myalgias that have been going on for a couple of days.  She is not sleeping well.  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  She says she really struggles with having to skip lunch.  Subjective:   1. Prediabetes Roberta [name] has a diagnosis of prediabetes based on her elevated HgA1c and was informed this puts her at greater risk of developing diabetes. She continues to work on diet and exercise to decrease her risk of diabetes. She denies nausea or hypoglycemia.  Roberta [name] is taking metformin 500 mg daily.  Lab Results  Component Value Date   HGBA1C 5.9 (H) 08/07/2019   Lab Results  Component Value Date   INSULIN 7.2 08/07/2019   INSULIN 13.4 01/31/2018   INSULIN 10.6 10/08/2017   2. Depression with anxiety Effexor was increased to 75 mg daily.  Blood pressure is controlled.  She says that her symptoms are well controlled.  3. Essential hypertension Blood pressure is controlled today.  Denies chest pain, chest pressure, and headache.  Blood pressure was elevated at Dr. Virgie Dad office.  Assessment/Plan:   1. Prediabetes Roberta [name] will continue to work on weight loss, exercise, and decreasing simple carbohydrates to help decrease the risk of diabetes.  Continue metformin with no change in dose.   2. Depression  with anxiety Continue Effexor with no change in dose.  3. Essential hypertension Roberta [name] is working on healthy weight loss and exercise to improve blood pressure control. We will watch for signs of hypotension as she continues her lifestyle modifications.  She will follow-up on her blood pressure at her next appointment.  4. Class 3 severe obesity with serious comorbidity and body mass index (BMI) of 50.0 to 59.9 in adult, unspecified obesity type (HCC)  Roberta [name] is currently in the action stage of change. As such, her goal is to continue with weight loss efforts. She has agreed to the Category 3 Plan.   Exercise goals: All adults should avoid inactivity. Some physical activity is better than none, and adults who participate in any amount of physical activity gain some health benefits.  Behavioral modification strategies: increasing lean protein intake, no skipping meals, meal planning and cooking strategies, keeping healthy foods in the home and planning for success.  Roberta [name] has agreed to follow-up with our clinic in 3 weeks. She was informed of the importance of frequent follow-up visits to maximize her success with intensive lifestyle modifications for her multiple health conditions.   Objective:   Blood pressure 130/86, pulse 76, temperature 98.9 F (37.2 C), temperature source Oral, height 5\' 6"  (1.676 m), weight 266 lb (120.7 kg), SpO2 100 %. Body mass index is 42.93 kg/m.  General: Cooperative, alert, well developed, in no acute distress. HEENT: Conjunctivae and lids unremarkable. Cardiovascular: Regular rhythm.  Lungs: Normal work of  breathing. Neurologic: No focal deficits.   Lab Results  Component Value Date   CREATININE 0.98 09/23/2019   BUN 19 09/23/2019   NA 141 09/23/2019   K 3.8 09/23/2019   CL 106 09/23/2019   CO2 27 09/23/2019   Lab Results  Component Value Date   ALT 13 09/23/2019   AST 21 09/23/2019   ALKPHOS 59 09/23/2019   BILITOT 0.3 09/23/2019   Lab  Results  Component Value Date   HGBA1C 5.9 (H) 08/07/2019   HGBA1C 6.4 (H) 01/27/2019   HGBA1C 6.0 (H) 09/25/2018   HGBA1C 5.9 (H) 07/17/2018   HGBA1C 6.1 (H) 01/31/2018   Lab Results  Component Value Date   INSULIN 7.2 08/07/2019   INSULIN 13.4 01/31/2018   INSULIN 10.6 10/08/2017   Lab Results  Component Value Date   TSH 1.480 10/08/2017   Lab Results  Component Value Date   CHOL 138 08/07/2019   HDL 69 08/07/2019   LDLCALC 54 08/07/2019   TRIG 78 08/07/2019   CHOLHDL 2 06/25/2017   Lab Results  Component Value Date   WBC 4.7 09/23/2019   HGB 11.6 (L) 09/23/2019   HCT 37.0 09/23/2019   MCV 82.0 09/23/2019   PLT 233 09/23/2019   Lab Results  Component Value Date   IRON 162 (H) 01/22/2017   FERRITIN 14.5 01/22/2017   Attestation Statements:   Reviewed by clinician on day of visit: allergies, medications, problem list, medical history, surgical history, family history, social history, and previous encounter notes.  Time spent on visit including pre-visit chart review and post-visit care and charting was 17 minutes.   I, Water quality scientist, CMA, am acting as transcriptionist for Coralie Common, MD. I have reviewed the above documentation for accuracy and completeness, and I agree with the above. - Jinny Blossom, MD

## 2019-11-13 ENCOUNTER — Ambulatory Visit (INDEPENDENT_AMBULATORY_CARE_PROVIDER_SITE_OTHER): Payer: BC Managed Care – PPO | Admitting: Family Medicine

## 2019-11-13 ENCOUNTER — Other Ambulatory Visit: Payer: Self-pay

## 2019-11-13 ENCOUNTER — Encounter (INDEPENDENT_AMBULATORY_CARE_PROVIDER_SITE_OTHER): Payer: Self-pay | Admitting: Family Medicine

## 2019-11-13 VITALS — BP 122/75 | HR 78 | Temp 98.4°F | Ht 66.0 in | Wt 261.0 lb

## 2019-11-13 DIAGNOSIS — I10 Essential (primary) hypertension: Secondary | ICD-10-CM

## 2019-11-13 DIAGNOSIS — Z9189 Other specified personal risk factors, not elsewhere classified: Secondary | ICD-10-CM | POA: Diagnosis not present

## 2019-11-13 DIAGNOSIS — R7303 Prediabetes: Secondary | ICD-10-CM

## 2019-11-13 DIAGNOSIS — Z6841 Body Mass Index (BMI) 40.0 and over, adult: Secondary | ICD-10-CM

## 2019-11-13 MED ORDER — METFORMIN HCL 500 MG PO TABS: 500.0000 mg | ORAL_TABLET | Freq: Every morning | ORAL | 0 refills | Status: DC

## 2019-11-13 MED ORDER — HYDROCHLOROTHIAZIDE 12.5 MG PO TABS: 12.5000 mg | ORAL_TABLET | Freq: Every day | ORAL | 0 refills | Status: DC

## 2019-11-17 NOTE — Progress Notes (Signed)
Chief Complaint:   OBESITY Roberta [name] is here to discuss her progress with her obesity treatment plan along with follow-up of her obesity related diagnoses. Roberta [name] is on the Category 3 Plan and states she is following her eating plan approximately 80-95% of the time. 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  Today's visit was #: 14 Starting weight: 292 lbs Starting date: 10/08/2017 Today's weight: 261 lbs Today's date: 11/13/2019 Total lbs lost to date: 31 Total lbs lost since last in-office visit: 5  Interim History: Roberta [name] has been working and trying to maintain her sanity in the past few weeks. Working from home the week of Thanksgiving. 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 She is likely getting together with family for Thanksgiving.   Subjective:   1. Pre-diabetes Roberta [name]'s last A1c was 5.9 and insulin 7.2. She is on metformin daily and she notes minimal carbohydrate cravings.  2. Essential hypertension Roberta [name]'s blood pressure is well controlled. She denies chest pain, chest pressure, or headache.  3. At risk of diabetes mellitus Roberta [name] is at higher than average risk for developing diabetes due to obesity.   Assessment/Plan:   1. Pre-diabetes Roberta [name] will continue to work on weight loss, exercise, and decreasing simple carbohydrates to help decrease the risk of diabetes. We will refill metformin for 1 month.  - metFORMIN (GLUCOPHAGE) 500 MG tablet; Take 1 tablet (500 mg total) by mouth every morning.  Dispense: 30 tablet; Refill: 0  2. Essential hypertension Roberta [name] is working on healthy weight loss and exercise to improve blood pressure control. We will watch for signs of hypotension as she continues her lifestyle modifications. We will refill hydrochlorothiazide for 1 month, and we will repeat labs in January 2022.  - hydrochlorothiazide (HYDRODIURIL) 12.5 MG tablet; Take 1 tablet (12.5 mg total) by mouth  daily.  Dispense: 30 tablet; Refill: 0  3. At risk of diabetes mellitus Roberta [name] was given approximately 15 minutes of diabetes education and counseling today. We discussed intensive lifestyle modifications today with an emphasis on weight loss as well as increasing exercise and decreasing simple carbohydrates in her diet. We also reviewed medication options with an emphasis on risk versus benefit of those discussed.   Repetitive spaced learning was employed today to elicit superior memory formation and behavioral change.  4. Class 3 severe obesity with serious comorbidity and body mass index (BMI) of 40.0 to 44.9 in adult, unspecified obesity type (HCC) Roberta [name] is currently in the action stage of change. As such, her goal is to continue with weight loss efforts. She has agreed to the Category 3 Plan.   Exercise goals: All adults should avoid inactivity. Some physical activity is better than none, and adults who participate in any amount of physical activity gain some health benefits.  Behavioral modification strategies: increasing lean protein intake, meal planning and cooking strategies, keeping healthy foods in the home and holiday eating strategies .  Roberta [name] has agreed to follow-up with our clinic in 3 weeks. She was informed of the importance of frequent follow-up visits to maximize her success with intensive lifestyle modifications for her multiple health conditions.   Objective:   Blood pressure 122/75, pulse 78, temperature 98.4 F (36.9 C), temperature source Oral, height 5\' 6"  (1.676 m), weight 261 lb (118.4 kg), SpO2 98 %. Body mass index is 42.13 kg/m.  General: Cooperative, alert, well developed, in no acute distress. HEENT: Conjunctivae  and lids unremarkable. Cardiovascular: Regular rhythm.  Lungs: Normal work of breathing. Neurologic: No focal deficits.   Lab Results  Component Value Date   CREATININE 0.98 09/23/2019   BUN 19 09/23/2019   NA 141 09/23/2019   K 3.8  09/23/2019   CL 106 09/23/2019   CO2 27 09/23/2019   Lab Results  Component Value Date   ALT 13 09/23/2019   AST 21 09/23/2019   ALKPHOS 59 09/23/2019   BILITOT 0.3 09/23/2019   Lab Results  Component Value Date   HGBA1C 5.9 (H) 08/07/2019   HGBA1C 6.4 (H) 01/27/2019   HGBA1C 6.0 (H) 09/25/2018   HGBA1C 5.9 (H) 07/17/2018   HGBA1C 6.1 (H) 01/31/2018   Lab Results  Component Value Date   INSULIN 7.2 08/07/2019   INSULIN 13.4 01/31/2018   INSULIN 10.6 10/08/2017   Lab Results  Component Value Date   TSH 1.480 10/08/2017   Lab Results  Component Value Date   CHOL 138 08/07/2019   HDL 69 08/07/2019   LDLCALC 54 08/07/2019   TRIG 78 08/07/2019   CHOLHDL 2 06/25/2017   Lab Results  Component Value Date   WBC 4.7 09/23/2019   HGB 11.6 (L) 09/23/2019   HCT 37.0 09/23/2019   MCV 82.0 09/23/2019   PLT 233 09/23/2019   Lab Results  Component Value Date   IRON 162 (H) 01/22/2017   FERRITIN 14.5 01/22/2017   Attestation Statements:   Reviewed by clinician on day of visit: allergies, medications, problem list, medical history, surgical history, family history, social history, and previous encounter notes.   I, Trixie Dredge, am acting as transcriptionist for Coralie Common, MD.  I have reviewed the above documentation for accuracy and completeness, and I agree with the above. - Jinny Blossom, MD

## 2019-11-20 ENCOUNTER — Encounter: Payer: Self-pay | Admitting: Internal Medicine

## 2019-11-21 ENCOUNTER — Encounter: Payer: Self-pay | Admitting: Internal Medicine

## 2019-11-21 ENCOUNTER — Telehealth (INDEPENDENT_AMBULATORY_CARE_PROVIDER_SITE_OTHER): Payer: BC Managed Care – PPO | Admitting: Internal Medicine

## 2019-11-21 DIAGNOSIS — L509 Urticaria, unspecified: Secondary | ICD-10-CM | POA: Diagnosis not present

## 2019-11-21 MED ORDER — METHYLPREDNISOLONE 4 MG PO TBPK: ORAL_TABLET | ORAL | 0 refills | Status: DC

## 2019-11-21 NOTE — Progress Notes (Signed)
Virtual Visit via telephone Note  I connected with Roberta [name] on 11/21/19 at 10:45 AM EST by telephone and verified that I am speaking with the correct person using two identifiers.   I discussed the limitations of evaluation and management by telemedicine and the availability of in person appointments. The patient expressed understanding and agreed to proceed.  Present for the visit:  Myself, Dr Billey Gosling, Golden Hurter.  The patient is currently at home and I am in the office.    No referring provider.    History of Present Illness: This is an acute visit for hives.  She has hives on her outer thigh.  They do itch.   Initially they improved with topical steroid but flared back up.  No other symptoms.  She thinks it may be from red dye.     Review of Systems  Constitutional: Negative for fever.  HENT: Negative for sore throat.   Respiratory: Negative for cough, shortness of breath and wheezing.   Cardiovascular: Negative for chest pain and palpitations.  Neurological: Negative for dizziness and headaches.      Social History   Socioeconomic History  . Marital status: Legally Separated    Spouse name: Not on file  . Number of children: Not on file  . Years of education: Not on file  . Highest education level: Not on file  Occupational History  . Not on file  Tobacco Use  . Smoking status: Never Smoker  . Smokeless tobacco: Never Used  Vaping Use  . Vaping Use: Never used  Substance and Sexual Activity  . Alcohol use: Yes    Comment: occasional wine  . Drug use: No  . Sexual activity: Not Currently  Other Topics Concern  . Not on file  Social History Narrative  . Not on file   Social Determinants of Health   Financial Resource Strain:   . Difficulty of Paying Living Expenses: Not on file  Food Insecurity:   . Worried About Charity fundraiser in the Last Year: Not on file  . Ran Out of Food in the Last Year: Not on file  Transportation Needs:   .  Lack of Transportation (Medical): Not on file  . Lack of Transportation (Non-Medical): Not on file  Physical Activity:   . Days of Exercise per Week: Not on file  . Minutes of Exercise per Session: Not on file  Stress:   . Feeling of Stress : Not on file  Social Connections:   . Frequency of Communication with Friends and Family: Not on file  . Frequency of Social Gatherings with Friends and Family: Not on file  . Attends Religious Services: Not on file  . Active Member of Clubs or Organizations: Not on file  . Attends Archivist Meetings: Not on file  . Marital Status: Not on file     Assessment and Plan:  See Problem List for Assessment and Plan of chronic medical problems.   Follow Up Instructions:    I discussed the assessment and treatment plan with the patient. The patient was provided an opportunity to ask questions and all were answered. The patient agreed with the plan and demonstrated an understanding of the instructions.   The patient was advised to call back or seek an in-person evaluation if the symptoms worsen or if the condition fails to improve as anticipated.    [name], MD

## 2019-11-21 NOTE — Assessment & Plan Note (Signed)
Acute Last episode 01/2018 after eating [employer] - spicy doritos  She now has hives on her outer thigh - thinks it may be related to red dye - topical cortisone helped a little, but flared back up No concerning symptoms Start zyrtec x 2 weeks Medrol dose pak Will refer to allergy for allergy testing

## 2019-12-04 ENCOUNTER — Ambulatory Visit (INDEPENDENT_AMBULATORY_CARE_PROVIDER_SITE_OTHER): Payer: BC Managed Care – PPO | Admitting: Family Medicine

## 2019-12-04 ENCOUNTER — Other Ambulatory Visit: Payer: Self-pay

## 2019-12-04 ENCOUNTER — Encounter (INDEPENDENT_AMBULATORY_CARE_PROVIDER_SITE_OTHER): Payer: Self-pay | Admitting: Family Medicine

## 2019-12-04 VITALS — BP 107/74 | HR 79 | Temp 98.3°F | Ht 66.0 in | Wt 261.0 lb

## 2019-12-04 DIAGNOSIS — R7303 Prediabetes: Secondary | ICD-10-CM | POA: Diagnosis not present

## 2019-12-04 DIAGNOSIS — Z9189 Other specified personal risk factors, not elsewhere classified: Secondary | ICD-10-CM | POA: Insufficient documentation

## 2019-12-04 DIAGNOSIS — Z9884 Bariatric surgery status: Secondary | ICD-10-CM | POA: Diagnosis not present

## 2019-12-04 DIAGNOSIS — I10 Essential (primary) hypertension: Secondary | ICD-10-CM | POA: Diagnosis not present

## 2019-12-04 DIAGNOSIS — Z6841 Body Mass Index (BMI) 40.0 and over, adult: Secondary | ICD-10-CM

## 2019-12-04 MED ORDER — METFORMIN HCL 500 MG PO TABS: 500.0000 mg | ORAL_TABLET | Freq: Every morning | ORAL | 0 refills | Status: DC

## 2019-12-04 MED ORDER — HYDROCHLOROTHIAZIDE 12.5 MG PO TABS: 12.5000 mg | ORAL_TABLET | Freq: Every day | ORAL | 0 refills | Status: DC

## 2019-12-04 NOTE — Progress Notes (Signed)
Chief Complaint:   OBESITY Roberta [name] is here to discuss her progress with her obesity treatment plan along with follow-up of her obesity related diagnoses. Roberta [name] is on the Category 3 Plan and states she is following her eating plan approximately 95% of the time. [name] states she is walking 30 minutes 3 times per week.  Today's visit was #: 38 Starting weight: 292 lbs Starting date: 10/08/2017 Today's weight: 261 lbs Today's date: 12/03/2019 Total lbs lost to date: 31 lbs Total lbs lost since last in-office visit: 0 lbs Total weight loss percentage to date: -10.62%  Interim History: Roberta [name] is a patient of Dr. Jearld Shines, and this is my first time seeing her. Roberta [name] reports increased stress in life and work.She is not eating foods on the plan because she doesn't take time for herself, therefore she does not food prep.  Assessment/Plan:   1. Essential hypertension Roberta [name] is prescribed HCTZ 12.5 mg every morning. She reports her blood pressure at home is about the same as it it is here (see below).  Review: taking medications as instructed, no medication side effects noted, no chest pain on exertion, no dyspnea on exertion, no swelling of ankles.   BP Readings from Last 3 Encounters:  12/12/19 (!) 162/78  12/11/19 (!) 162/94  12/04/19 107/74   Plan: Refill HCTZ for 1 month, as per below.  Refill- hydrochlorothiazide (HYDRODIURIL) 12.5 MG tablet; Take 1 tablet (12.5 mg total) by mouth daily.  Dispense: 30 tablet; Refill: 0  2. Pre-diabetes [name] is prescribed Metformin and takes 500 mg every night before bed.  Roberta [name] has a diagnosis of prediabetes based on her elevated HgA1c and was informed this puts her at greater risk of developing diabetes. She continues to work on diet and exercise to decrease her risk of diabetes. She denies nausea or hypoglycemia.  Lab Results  Component Value Date   HGBA1C 5.9 (H) 12/12/2019   Lab Results  Component Value Date   INSULIN 7.2 08/07/2019   INSULIN  13.4 01/31/2018   INSULIN 10.6 10/08/2017   Plan: Continue current treatment plan as directed. Refill Metformin for 1 month supply, was per below.  Refill- metFORMIN (GLUCOPHAGE) 500 MG tablet; Take 1 tablet (500 mg total) by mouth every morning.  Dispense: 30 tablet; Refill: 0  3. S/P gastric bypass Roberta [name] had gastric bypass surgery in 2005 and has limited ability to eat larger meals.  Plan: I discussed meal planning and eating smaller meals daily with Roberta [name].  4. At risk for deficient intake of food Roberta [name] was given approximately 30 minutes of deficit intake of food prevention counseling today. Roberta [name] is at risk for eating too few calories based on current food recall. She was encouraged to focus on meeting caloric and protein goals according to her recommended meal plan.   5. Class 3 severe obesity with serious comorbidity and body mass index (BMI) of 40.0 to 44.9 in adult, unspecified obesity type (HCC) Roberta [name] is currently in the action stage of change. As such, her goal is to continue with weight loss efforts. She has agreed to the Category 3 Plan.   Exercise goals: As is, but increase to 30 minutes 5 days a week as tolerated  Behavioral modification strategies: increasing lean protein intake, no skipping meals, meal planning and cooking strategies and planning for success.  Roberta [name] has agreed to follow-up with our clinic in 3 weeks. She was informed of the importance of frequent follow-up visits to maximize her success with intensive lifestyle modifications  for her multiple health conditions.   Objective:   Blood pressure 107/74, pulse 79, temperature 98.3 F (36.8 C), height 5\' 6"  (1.676 m), weight 261 lb (118.4 kg), SpO2 97 %. Body mass index is 42.13 kg/m.  General: Cooperative, alert, well developed, in no acute distress. HEENT: Conjunctivae and lids unremarkable. Cardiovascular: Regular rhythm.  Lungs: Normal work of breathing. Neurologic: No focal deficits.   Lab Results   Component Value Date   CREATININE 0.85 12/12/2019   BUN 12 12/12/2019   NA 138 12/12/2019   K 4.0 12/12/2019   CL 105 12/12/2019   CO2 24 12/12/2019   Lab Results  Component Value Date   ALT 13 09/23/2019   AST 21 09/23/2019   ALKPHOS 59 09/23/2019   BILITOT 0.3 09/23/2019   Lab Results  Component Value Date   HGBA1C 5.9 (H) 12/12/2019   HGBA1C 6.0 (H) 12/11/2019   HGBA1C 5.9 (H) 08/07/2019   HGBA1C 6.4 (H) 01/27/2019   HGBA1C 6.0 (H) 09/25/2018   Lab Results  Component Value Date   INSULIN 7.2 08/07/2019   INSULIN 13.4 01/31/2018   INSULIN 10.6 10/08/2017   Lab Results  Component Value Date   TSH 1.480 10/08/2017   Lab Results  Component Value Date   CHOL 116 12/12/2019   HDL 57 12/12/2019   LDLCALC 42 12/12/2019   TRIG 83 12/12/2019   CHOLHDL 2.0 12/12/2019   Lab Results  Component Value Date   WBC 3.4 (L) 12/12/2019   HGB 10.7 (L) 12/12/2019   HCT 33.3 (L) 12/12/2019   MCV 80.8 12/12/2019   PLT 147 (L) 12/12/2019   Lab Results  Component Value Date   IRON 162 (H) 01/22/2017   FERRITIN 14.5 01/22/2017    Attestation Statements:   Reviewed by clinician on day of visit: allergies, medications, problem list, medical history, surgical history, family history, social history, and previous encounter notes.  Coral Ceo, am acting as Location manager for [employer], DO.  I have reviewed the above documentation for accuracy and completeness, and I agree with the above. Marjory Sneddon, D.O.  The [place] was signed into law in 2016 which includes the topic of electronic health records.  This provides immediate access to information in MyChart.  This includes consultation notes, operative notes, office notes, lab results and pathology reports.  If you have any questions about what you read please let us know at your next visit so we can discuss your concerns and take corrective action if need be.  We are right here with you.

## 2019-12-11 ENCOUNTER — Encounter (HOSPITAL_COMMUNITY)
Admission: EM | Disposition: A | Discharge status: Home / Self Care | Payer: Self-pay | Source: Home / Self Care | Attending: Cardiology

## 2019-12-11 ENCOUNTER — Other Ambulatory Visit: Payer: Self-pay

## 2019-12-11 ENCOUNTER — Encounter (HOSPITAL_COMMUNITY): Payer: Self-pay | Admitting: Emergency Medicine

## 2019-12-11 ENCOUNTER — Ambulatory Visit (HOSPITAL_COMMUNITY)
Admission: EM | Admit: 2019-12-11 | Discharge: 2019-12-11 | Disposition: A | Discharge status: Home / Self Care | Payer: BC Managed Care – PPO | Attending: Internal Medicine | Admitting: Internal Medicine

## 2019-12-11 ENCOUNTER — Emergency Department (HOSPITAL_COMMUNITY): Payer: BC Managed Care – PPO

## 2019-12-11 ENCOUNTER — Inpatient Hospital Stay (HOSPITAL_COMMUNITY)
Admission: EM | Admit: 2019-12-11 | Discharge: 2019-12-12 | DRG: 251 | Disposition: A | Discharge status: Home / Self Care | Payer: BC Managed Care – PPO | Attending: Cardiology | Admitting: Cardiology

## 2019-12-11 ENCOUNTER — Telehealth: Payer: Self-pay | Admitting: Internal Medicine

## 2019-12-11 DIAGNOSIS — Z888 Allergy status to other drugs, medicaments and biological substances status: Secondary | ICD-10-CM

## 2019-12-11 DIAGNOSIS — Z9049 Acquired absence of other specified parts of digestive tract: Secondary | ICD-10-CM

## 2019-12-11 DIAGNOSIS — Z880 Allergy status to penicillin: Secondary | ICD-10-CM | POA: Diagnosis not present

## 2019-12-11 DIAGNOSIS — Z9012 Acquired absence of left breast and nipple: Secondary | ICD-10-CM

## 2019-12-11 DIAGNOSIS — I214 Non-ST elevation (NSTEMI) myocardial infarction: Principal | ICD-10-CM | POA: Diagnosis present

## 2019-12-11 DIAGNOSIS — Z170 Estrogen receptor positive status [ER+]: Secondary | ICD-10-CM | POA: Diagnosis not present

## 2019-12-11 DIAGNOSIS — Z20822 Contact with and (suspected) exposure to covid-19: Secondary | ICD-10-CM | POA: Diagnosis present

## 2019-12-11 DIAGNOSIS — C50212 Malignant neoplasm of upper-inner quadrant of left female breast: Secondary | ICD-10-CM

## 2019-12-11 DIAGNOSIS — Z9861 Coronary angioplasty status: Secondary | ICD-10-CM

## 2019-12-11 DIAGNOSIS — Z833 Family history of diabetes mellitus: Secondary | ICD-10-CM | POA: Diagnosis not present

## 2019-12-11 DIAGNOSIS — Z79810 Long term (current) use of selective estrogen receptor modulators (SERMs): Secondary | ICD-10-CM | POA: Diagnosis not present

## 2019-12-11 DIAGNOSIS — Z8249 Family history of ischemic heart disease and other diseases of the circulatory system: Secondary | ICD-10-CM

## 2019-12-11 DIAGNOSIS — Z841 Family history of disorders of kidney and ureter: Secondary | ICD-10-CM | POA: Diagnosis not present

## 2019-12-11 DIAGNOSIS — Z9884 Bariatric surgery status: Secondary | ICD-10-CM | POA: Diagnosis not present

## 2019-12-11 DIAGNOSIS — Z808 Family history of malignant neoplasm of other organs or systems: Secondary | ICD-10-CM | POA: Diagnosis not present

## 2019-12-11 DIAGNOSIS — I2510 Atherosclerotic heart disease of native coronary artery without angina pectoris: Secondary | ICD-10-CM

## 2019-12-11 DIAGNOSIS — F32A Depression, unspecified: Secondary | ICD-10-CM | POA: Diagnosis present

## 2019-12-11 DIAGNOSIS — Z823 Family history of stroke: Secondary | ICD-10-CM

## 2019-12-11 DIAGNOSIS — Z7984 Long term (current) use of oral hypoglycemic drugs: Secondary | ICD-10-CM

## 2019-12-11 DIAGNOSIS — Z79899 Other long term (current) drug therapy: Secondary | ICD-10-CM | POA: Diagnosis not present

## 2019-12-11 DIAGNOSIS — R079 Chest pain, unspecified: Secondary | ICD-10-CM

## 2019-12-11 DIAGNOSIS — R7303 Prediabetes: Secondary | ICD-10-CM | POA: Diagnosis present

## 2019-12-11 DIAGNOSIS — Z6841 Body Mass Index (BMI) 40.0 and over, adult: Secondary | ICD-10-CM | POA: Diagnosis not present

## 2019-12-11 DIAGNOSIS — I10 Essential (primary) hypertension: Secondary | ICD-10-CM | POA: Diagnosis present

## 2019-12-11 DIAGNOSIS — I252 Old myocardial infarction: Secondary | ICD-10-CM | POA: Diagnosis present

## 2019-12-11 HISTORY — PX: CORONARY BALLOON ANGIOPLASTY: CATH118233

## 2019-12-11 HISTORY — PX: LEFT HEART CATH AND CORONARY ANGIOGRAPHY: CATH118249

## 2019-12-11 LAB — BASIC METABOLIC PANEL
Anion gap: 9 (ref 5–15)
BUN: 19 mg/dL (ref 6–20)
CO2: 27 mmol/L (ref 22–32)
Calcium: 8.7 mg/dL — ABNORMAL LOW (ref 8.9–10.3)
Chloride: 100 mmol/L (ref 98–111)
Creatinine, Ser: 0.94 mg/dL (ref 0.44–1.00)
GFR, Estimated: >60 mL/min (ref >60)
Glucose, Bld: 104 mg/dL — ABNORMAL HIGH (ref 70–99)
Potassium: 3.6 mmol/L (ref 3.5–5.1)
Sodium: 136 mmol/L (ref 135–145)

## 2019-12-11 LAB — RESP PANEL BY RT-PCR (FLU A&B, COVID) ARPGX2
Influenza A by PCR: NEGATIVE
Influenza B by PCR: NEGATIVE
SARS Coronavirus 2 by RT PCR: NEGATIVE

## 2019-12-11 LAB — CBC
HCT: 37.6 % (ref 36.0–46.0)
Hemoglobin: 11.6 g/dL — ABNORMAL LOW (ref 12.0–15.0)
MCH: 25.8 pg — ABNORMAL LOW (ref 26.0–34.0)
MCHC: 30.9 g/dL (ref 30.0–36.0)
MCV: 83.6 fL (ref 80.0–100.0)
Platelets: 226 10*3/uL (ref 150–400)
RBC: 4.5 MIL/uL (ref 3.87–5.11)
RDW: 15.5 % (ref 11.5–15.5)
WBC: 5.6 10*3/uL (ref 4.0–10.5)
nRBC: 0 % (ref 0.0–0.2)

## 2019-12-11 LAB — TROPONIN I (HIGH SENSITIVITY)
Troponin I (High Sensitivity): 4623 ng/L (ref <18)
Troponin I (High Sensitivity): 6812 ng/L (ref <18)

## 2019-12-11 LAB — I-STAT BETA HCG BLOOD, ED (MC, WL, AP ONLY): I-stat hCG, quantitative: <5 m[IU]/mL (ref <5)

## 2019-12-11 LAB — HEMOGLOBIN A1C
Hgb A1c MFr Bld: 6 % — ABNORMAL HIGH (ref 4.8–5.6)
Mean Plasma Glucose: 125.5 mg/dL

## 2019-12-11 LAB — POCT ACTIVATED CLOTTING TIME: Activated Clotting Time: 344 seconds

## 2019-12-11 LAB — GLUCOSE, CAPILLARY: Glucose-Capillary: 114 mg/dL — ABNORMAL HIGH (ref 70–99)

## 2019-12-11 SURGERY — LEFT HEART CATH AND CORONARY ANGIOGRAPHY
Anesthesia: LOCAL

## 2019-12-11 MED ORDER — HYDRALAZINE HCL 20 MG/ML IJ SOLN: 10.0000 mg | INTRAMUSCULAR | Status: AC | PRN

## 2019-12-11 MED ORDER — MIDAZOLAM HCL 2 MG/2ML IJ SOLN
INTRAMUSCULAR | Status: AC
  Filled 2019-12-11: qty 2

## 2019-12-11 MED ORDER — MIDAZOLAM HCL 2 MG/2ML IJ SOLN
INTRAMUSCULAR | Status: DC | PRN
  Administered 2019-12-11 (×2): 1 mg via INTRAVENOUS

## 2019-12-11 MED ORDER — NITROGLYCERIN 1 MG/10 ML FOR IR/CATH LAB
INTRA_ARTERIAL | Status: DC | PRN
  Administered 2019-12-11 (×2): 200 ug via INTRACORONARY

## 2019-12-11 MED ORDER — VENLAFAXINE HCL ER 75 MG PO CP24
75.0000 mg | ORAL_CAPSULE | Freq: Every day | ORAL | Status: DC
  Administered 2019-12-12: 75 mg via ORAL
  Filled 2019-12-11 (×2): qty 1

## 2019-12-11 MED ORDER — NITROGLYCERIN IN D5W 200-5 MCG/ML-% IV SOLN
0.0000 ug/min | INTRAVENOUS | Status: DC
  Filled 2019-12-11: qty 250

## 2019-12-11 MED ORDER — TAMOXIFEN CITRATE 10 MG PO TABS
20.0000 mg | ORAL_TABLET | Freq: Every day | ORAL | Status: DC
  Filled 2019-12-11: qty 2

## 2019-12-11 MED ORDER — INSULIN ASPART 100 UNIT/ML ~~LOC~~ SOLN: 0.0000 [IU] | Freq: Three times a day (TID) | SUBCUTANEOUS | Status: DC

## 2019-12-11 MED ORDER — ACETAMINOPHEN 325 MG PO TABS: 650.0000 mg | ORAL_TABLET | ORAL | Status: DC | PRN

## 2019-12-11 MED ORDER — ONDANSETRON HCL 4 MG/2ML IJ SOLN
4.0000 mg | Freq: Once | INTRAMUSCULAR | Status: DC
  Filled 2019-12-11: qty 2

## 2019-12-11 MED ORDER — NITROGLYCERIN 0.4 MG SL SUBL
0.4000 mg | SUBLINGUAL_TABLET | SUBLINGUAL | Status: DC | PRN
  Administered 2019-12-11: 0.4 mg via SUBLINGUAL
  Filled 2019-12-11: qty 1

## 2019-12-11 MED ORDER — TICAGRELOR 90 MG PO TABS
90.0000 mg | ORAL_TABLET | Freq: Two times a day (BID) | ORAL | Status: DC
  Administered 2019-12-12: 90 mg via ORAL
  Filled 2019-12-11 (×2): qty 1

## 2019-12-11 MED ORDER — HEPARIN BOLUS VIA INFUSION
4000.0000 [IU] | Freq: Once | INTRAVENOUS | Status: AC
  Administered 2019-12-11: 4000 [IU] via INTRAVENOUS
  Filled 2019-12-11: qty 4000

## 2019-12-11 MED ORDER — ASPIRIN EC 81 MG PO TBEC
81.0000 mg | DELAYED_RELEASE_TABLET | Freq: Every day | ORAL | Status: DC
  Administered 2019-12-12: 81 mg via ORAL
  Filled 2019-12-11: qty 1

## 2019-12-11 MED ORDER — ASPIRIN 81 MG PO CHEW: 324.0000 mg | CHEWABLE_TABLET | ORAL | Status: AC

## 2019-12-11 MED ORDER — LABETALOL HCL 5 MG/ML IV SOLN
10.0000 mg | INTRAVENOUS | Status: AC | PRN
  Administered 2019-12-11: 10 mg via INTRAVENOUS
  Filled 2019-12-11: qty 4

## 2019-12-11 MED ORDER — FENTANYL CITRATE (PF) 100 MCG/2ML IJ SOLN
INTRAMUSCULAR | Status: AC
  Filled 2019-12-11: qty 2

## 2019-12-11 MED ORDER — DIPHENHYDRAMINE HCL 25 MG PO CAPS
25.0000 mg | ORAL_CAPSULE | Freq: Every evening | ORAL | Status: DC | PRN
  Administered 2019-12-11: 25 mg via ORAL
  Filled 2019-12-11: qty 1

## 2019-12-11 MED ORDER — ATORVASTATIN CALCIUM 80 MG PO TABS
80.0000 mg | ORAL_TABLET | Freq: Every day | ORAL | Status: DC
  Administered 2019-12-11 – 2019-12-12 (×2): 80 mg via ORAL
  Filled 2019-12-11 (×2): qty 1

## 2019-12-11 MED ORDER — GABAPENTIN 300 MG PO CAPS
300.0000 mg | ORAL_CAPSULE | Freq: Every day | ORAL | Status: DC
  Administered 2019-12-11: 300 mg via ORAL
  Filled 2019-12-11: qty 1

## 2019-12-11 MED ORDER — VERAPAMIL HCL 2.5 MG/ML IV SOLN
INTRAVENOUS | Status: DC | PRN
  Administered 2019-12-11: 10 mL via INTRA_ARTERIAL

## 2019-12-11 MED ORDER — LIDOCAINE HCL (PF) 1 % IJ SOLN
INTRAMUSCULAR | Status: DC | PRN
  Administered 2019-12-11: 2 mL

## 2019-12-11 MED ORDER — ASPIRIN 81 MG PO CHEW
324.0000 mg | CHEWABLE_TABLET | Freq: Once | ORAL | Status: AC
  Administered 2019-12-11: 324 mg via ORAL
  Filled 2019-12-11: qty 4

## 2019-12-11 MED ORDER — SODIUM CHLORIDE 0.9% FLUSH: 3.0000 mL | INTRAVENOUS | Status: DC | PRN

## 2019-12-11 MED ORDER — SODIUM CHLORIDE 0.9 % IV SOLN: 250.0000 mL | INTRAVENOUS | Status: DC | PRN

## 2019-12-11 MED ORDER — HEPARIN (PORCINE) IN NACL 1000-0.9 UT/500ML-% IV SOLN
INTRAVENOUS | Status: AC
  Filled 2019-12-11: qty 1000

## 2019-12-11 MED ORDER — ONDANSETRON HCL 4 MG/2ML IJ SOLN: 4.0000 mg | Freq: Four times a day (QID) | INTRAMUSCULAR | Status: DC | PRN

## 2019-12-11 MED ORDER — HEPARIN (PORCINE) 25000 UT/250ML-% IV SOLN
1200.0000 [IU]/h | INTRAVENOUS | Status: DC
  Administered 2019-12-11: 1200 [IU]/h via INTRAVENOUS
  Filled 2019-12-11: qty 250

## 2019-12-11 MED ORDER — HEPARIN SODIUM (PORCINE) 1000 UNIT/ML IJ SOLN
INTRAMUSCULAR | Status: DC | PRN
  Administered 2019-12-11 (×2): 6000 [IU] via INTRAVENOUS

## 2019-12-11 MED ORDER — SODIUM CHLORIDE 0.9% FLUSH
3.0000 mL | Freq: Two times a day (BID) | INTRAVENOUS | Status: DC
  Administered 2019-12-11: 3 mL via INTRAVENOUS

## 2019-12-11 MED ORDER — IOHEXOL 350 MG/ML SOLN
INTRAVENOUS | Status: DC | PRN
  Administered 2019-12-11: 135 mL

## 2019-12-11 MED ORDER — VERAPAMIL HCL 2.5 MG/ML IV SOLN
INTRAVENOUS | Status: AC
  Filled 2019-12-11: qty 2

## 2019-12-11 MED ORDER — NITROGLYCERIN 1 MG/10 ML FOR IR/CATH LAB
INTRA_ARTERIAL | Status: AC
  Filled 2019-12-11: qty 10

## 2019-12-11 MED ORDER — MORPHINE SULFATE (PF) 4 MG/ML IV SOLN
4.0000 mg | Freq: Once | INTRAVENOUS | Status: DC
  Filled 2019-12-11: qty 1

## 2019-12-11 MED ORDER — HEPARIN (PORCINE) IN NACL 1000-0.9 UT/500ML-% IV SOLN
INTRAVENOUS | Status: DC | PRN
  Administered 2019-12-11 (×2): 500 mL

## 2019-12-11 MED ORDER — TICAGRELOR 90 MG PO TABS
ORAL_TABLET | ORAL | Status: AC
  Filled 2019-12-11: qty 2

## 2019-12-11 MED ORDER — TICAGRELOR 90 MG PO TABS
ORAL_TABLET | ORAL | Status: DC | PRN
  Administered 2019-12-11: 180 mg via ORAL

## 2019-12-11 MED ORDER — SODIUM CHLORIDE 0.9 % IV SOLN: INTRAVENOUS | Status: AC

## 2019-12-11 MED ORDER — SODIUM CHLORIDE 0.9 % WEIGHT BASED INFUSION: 1.0000 mL/kg/h | INTRAVENOUS | Status: DC

## 2019-12-11 MED ORDER — SODIUM CHLORIDE 0.9 % WEIGHT BASED INFUSION: 3.0000 mL/kg/h | INTRAVENOUS | Status: AC

## 2019-12-11 MED ORDER — ASPIRIN 300 MG RE SUPP
300.0000 mg | RECTAL | Status: AC
  Filled 2019-12-11: qty 1

## 2019-12-11 MED ORDER — FENTANYL CITRATE (PF) 100 MCG/2ML IJ SOLN
INTRAMUSCULAR | Status: DC | PRN
  Administered 2019-12-11: 50 ug via INTRAVENOUS
  Administered 2019-12-11: 25 ug via INTRAVENOUS

## 2019-12-11 MED ORDER — METOPROLOL TARTRATE 25 MG PO TABS
25.0000 mg | ORAL_TABLET | Freq: Two times a day (BID) | ORAL | Status: DC
  Administered 2019-12-11 – 2019-12-12 (×2): 25 mg via ORAL
  Filled 2019-12-11 (×2): qty 1

## 2019-12-11 MED ORDER — LIDOCAINE HCL (PF) 1 % IJ SOLN
INTRAMUSCULAR | Status: AC
  Filled 2019-12-11: qty 30

## 2019-12-11 MED ORDER — ENOXAPARIN SODIUM 40 MG/0.4ML ~~LOC~~ SOLN
40.0000 mg | SUBCUTANEOUS | Status: DC
  Administered 2019-12-12: 40 mg via SUBCUTANEOUS
  Filled 2019-12-11: qty 0.4

## 2019-12-11 MED ORDER — HEPARIN SODIUM (PORCINE) 1000 UNIT/ML IJ SOLN
INTRAMUSCULAR | Status: AC
  Filled 2019-12-11: qty 1

## 2019-12-11 SURGICAL SUPPLY — 15 items
BALLN EMERGE MR 2.0X8 (BALLOONS) ×2
BALLOON EMERGE MR 2.0X8 (BALLOONS) ×1 IMPLANT
CATH 5FR JL3.5 JR4 ANG PIG MP (CATHETERS) ×2 IMPLANT
CATH LAUNCHER 6FR EBU3.5 (CATHETERS) ×2 IMPLANT
DEVICE RAD COMP TR BAND LRG (VASCULAR PRODUCTS) ×2 IMPLANT
GLIDESHEATH SLEND SS 6F .021 (SHEATH) ×2 IMPLANT
GUIDEWIRE INQWIRE 1.5J.035X260 (WIRE) ×1 IMPLANT
INQWIRE 1.5J .035X260CM (WIRE) ×2
KIT ENCORE 26 ADVANTAGE (KITS) ×2 IMPLANT
KIT HEART LEFT (KITS) ×2 IMPLANT
PACK CARDIAC CATHETERIZATION (CUSTOM PROCEDURE TRAY) ×2 IMPLANT
SYR MEDRAD MARK 7 150ML (SYRINGE) ×2 IMPLANT
TRANSDUCER W/STOPCOCK (MISCELLANEOUS) ×2 IMPLANT
TUBING CIL FLEX 10 FLL-RA (TUBING) ×2 IMPLANT
WIRE RUNTHROUGH .014X180CM (WIRE) ×2 IMPLANT

## 2019-12-11 NOTE — Telephone Encounter (Signed)
needs to o to ED or urgent care

## 2019-12-11 NOTE — Interval H&P Note (Signed)
History and Physical Interval Note:  12/11/2019 4:28 PM  Roberta [name]  has presented today for surgery, with the diagnosis of NSTEMI.  The various methods of treatment have been discussed with the patient and family. After consideration of risks, benefits and other options for treatment, the patient has consented to  Procedure(s): LEFT HEART CATH AND CORONARY ANGIOGRAPHY (N/A) as a surgical intervention.  The patient's history has been reviewed, patient examined, no change in status, stable for surgery.  I have reviewed the patient's chart and labs.  Questions were answered to the patient's satisfaction.    Cath Lab Visit (complete for each Cath Lab visit)  Clinical Evaluation Leading to the Procedure:   ACS: Yes.    Non-ACS:  N/A  [name]

## 2019-12-11 NOTE — ED Triage Notes (Signed)
Patient presents to Roberta [name] for evaluation of chest pain starting last night, no precipitating event.  Patient c/o left sided chest pressure "like someone is sitting on my chest".  Patient states hx of cancer, left breast mastectomy.  Was also taken off one of her BP meds approx 6 months ago.  Patient c/o some difficulty getting a deep breath in, denies jaw pain, c/o back pain.

## 2019-12-11 NOTE — ED Triage Notes (Signed)
Pt reports sudden onset of central chest pain last night while watching tv. Denies radiation of pain. Pt seen at [facility] and referred here for further eval.

## 2019-12-11 NOTE — ED Notes (Signed)
Patient is being discharged from the Urgent Care and sent to the Emergency Department via Private Vehicle. Per Loura Halt, APP, patient is in need of higher level of care due to chest pain and comorbidities. Patient is aware and verbalizes understanding of plan of care.  Vitals:   12/11/19 1148  BP: (!) 162/94  Pulse: 77  Resp: 18  Temp: 97.9 F (36.6 C)  SpO2: 100%

## 2019-12-11 NOTE — ED Notes (Signed)
Sudden CP that is mid chest does not radiate. went to urgent care and sent here.   Pain 3/10 at this time, chest pressure  Denies N/V  BP (!) 160/72 (BP Location: Right Arm)   Pulse 71   Temp 98.2 F (36.8 C) (Oral)   Resp 18   Ht 5\' 6"  (1.676 m)   Wt 117.9 kg   SpO2 100%   BMI 41.97 kg/m

## 2019-12-11 NOTE — Telephone Encounter (Signed)
Roberta [name] called--Access Nurse Patient complaining of chest pain that began overnight. Eased up this am when she got to work. It's been non stop but not as severe. Rates 2-3/10 tightness. At its worse, was 5-6/10 woke her up last night and kept her awake. No illness.    Patient is at work and does not want to go to the ED as recommended, wants an appointment in the office

## 2019-12-11 NOTE — ED Provider Notes (Addendum)
[facility] EMERGENCY DEPARTMENT Provider Note   CSN: [id number] Arrival date & time: 12/11/19  1153     History Chief Complaint  Patient presents with  . Chest Pain    Roberta [name] is a 54 y.o. female.  Patient with history of hypertension, prediabetes, obesity, history of breast cancer status post left-sided mastectomy currently maintained on tamoxifen --presents the emergency department for evaluation of chest pain and pressure.  Patient states that symptoms started last night around 8 PM while at rest.  She describes the pain as a pressure in her left chest.  Pressure does not radiate.  She does not have any abdominal pain, vomiting or diarrhea.  No diaphoresis.  She states a lot of difficulty sleeping last night.  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  Patient called her doctor who recommended that she call the ambulance.  She had a friend drive her to urgent care and was then referred to the emergency department.  Patient does not use tobacco and denies a history of diabetes.  She has a younger sister who has had stents and a bypass surgery.  No lower extremity swelling or pain. Patient denies risk factors for pulmonary embolism including: unilateral leg swelling, history of DVT/PE/other blood clots, use of exogenous hormones, recent immobilizations, recent surgery, recent travel (>4hr segment), hemoptysis.          Past Medical History:  Diagnosis Date  . Back pain   . Cancer [facility])    multifocal left breast  . Depression   . Hip pain   . Hypertension   . Obesity   . PONV (postoperative nausea and vomiting)   . Prediabetes   . Swelling    bilat LE  . Wears glasses     Patient Active Problem List   Diagnosis Date Noted  . Essential hypertension 12/04/2019  . S/P gastric bypass 12/04/2019  . At risk for deficient intake of food  12/04/2019  . Chest pressure 08/22/2019  . Premenstrual dysphoric disorder 12/22/2018  . Anxiety and depression 12/10/2018  . Morbid obesity with BMI of 40.0-44.9, adult ([place]) 09/04/2018  . Malignant neoplasm of upper-inner quadrant of left breast in female, estrogen receptor positive ([place]) 09/02/2018  . Vitamin D deficiency, taking vit D 5000 IU daily 02/04/2018  . Urticaria 01/17/2018  . Weight gain following gastric bypass surgery 06/25/2017  . 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 Left hip pain 02/10/2017  . Microcytic anemia, mild 01/21/2017  . Malabsorption, due to gastic bypass 2005 12/13/2015  . Prediabetes 12/13/2015  . Class 3 severe obesity with serious comorbidity and body mass index (BMI) of 40.0 to 44.9 in adult ([place]) 12/13/2015  . Globus sensation 12/13/2015  . Leg edema, L > R 12/13/2015  . Dense breasts 12/13/2011  . 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 Hypertension, Rx Lisinopril-HCTZ 11/10/2011    Past Surgical History:  Procedure Laterality Date  . CHOLECYSTECTOMY    . COLONOSCOPY    . DILATION AND CURETTAGE OF UTERUS    . GASTRIC BYPASS    . KNEE SURGERY  2002   Ligament repair  . MASTECTOMY Left 2019  . MASTECTOMY W/ SENTINEL NODE BIOPSY Left 10/01/2018   Procedure: LEFT TOTAL MASTECTOMY WITH SENTINEL LYMPH NODE BIOPSY AND BLUE DYE INJECTION;  Surgeon:  Fanny Skates, MD;  Location: [facility] OR;  Service: General;  Laterality: Left;     OB History    Gravida  0   Para  0   Term  0   Preterm  0   AB  0   Living  0     SAB  0   IAB  0   Ectopic  0   Multiple  0   Live Births  0           Family History  Problem Relation Age of Onset  . Diabetes Mother   . Hypertension Mother   . Heart disease Mother   . Stroke Mother   . Kidney disease Mother   . Obesity Mother   . Cancer Maternal Aunt        lung  . Cancer Maternal Uncle        lung  . Throat cancer  Maternal Aunt     Social History   Tobacco Use  . Smoking status: Never Smoker  . Smokeless tobacco: Never Used  Vaping Use  . Vaping Use: Never used  Substance Use Topics  . Alcohol use: Yes    Comment: occasional wine  . Drug use: No    Home Medications Prior to Admission medications   Medication Sig Start Date End Date Taking? Authorizing Provider  gabapentin (NEURONTIN) 300 MG capsule Take 1 capsule (300 mg total) by mouth at bedtime. 09/23/19   Magrinat, Virgie Dad, MD  hydrochlorothiazide (HYDRODIURIL) 12.5 MG tablet Take 1 tablet (12.5 mg total) by mouth daily. 12/04/19   Opalski, Neoma Laming, DO  metFORMIN (GLUCOPHAGE) 500 MG tablet Take 1 tablet (500 mg total) by mouth every morning. 12/04/19   Opalski, Neoma Laming, DO  methylPREDNISolone (MEDROL DOSEPAK) 4 MG TBPK tablet 24 mg PO on day 1, then decr. by 4 mg/day x5 days 11/21/19   [name], MD  Multiple Vitamin (MULTIVITAMIN) tablet Take 1 tablet by mouth daily.    [provider]  mupirocin ointment (BACTROBAN) 2 % Apply 1 application topically 2 (two) times daily as needed. 01/09/19   Briscoe Deutscher, DO  tamoxifen (NOLVADEX) 20 MG tablet Take 1 tablet (20 mg total) by mouth daily. 09/23/19   Magrinat, Virgie Dad, MD  venlafaxine XR (EFFEXOR-XR) 75 MG 24 hr capsule Take 1 capsule (75 mg total) by mouth daily with breakfast. 09/23/19   Magrinat, Virgie Dad, MD  sertraline (ZOLOFT) 50 MG tablet Take 1 tablet (50 mg total) by mouth daily. 07/17/19 08/22/19  Laqueta Linden, MD    Allergies    Lisinopril, Penicillins, Norvasc [amlodipine besylate], Red dye, and Tetanus toxoids  Review of Systems   Review of Systems  Constitutional: Negative for diaphoresis and fever.  Eyes: Negative for redness.  Respiratory: Negative for cough and shortness of breath.   Cardiovascular: Positive for chest pain. Negative for palpitations and leg swelling.  Gastrointestinal: Negative for abdominal pain, nausea and vomiting.  Genitourinary:  Negative for dysuria.  Musculoskeletal: Negative for back pain and neck pain.  Skin: Negative for rash.  Neurological: Negative for syncope and light-headedness.  Psychiatric/Behavioral: The patient is not nervous/anxious.     Physical Exam Updated Vital Signs BP (!) 162/74   Pulse 63   Temp 98.2 F (36.8 C) (Oral)   Resp 15   Ht 5\' 6"  (1.676 m)   Wt 117.9 kg   SpO2 99%   BMI 41.97 kg/m   Physical Exam Vitals and nursing note reviewed.  Constitutional:  Appearance: She is well-developed and well-nourished. She is not diaphoretic.  HENT:     Head: Normocephalic and atraumatic.     Mouth/Throat:     Mouth: Mucous membranes are normal. Mucous membranes are not dry.  Eyes:     Conjunctiva/sclera: Conjunctivae normal.  Neck:     Vascular: Normal carotid pulses. No carotid bruit or JVD.     Trachea: Trachea normal. No tracheal deviation.  Cardiovascular:     Rate and Rhythm: Normal rate and regular rhythm.     Pulses: Intact distal pulses. No decreased pulses.     Heart sounds: Normal heart sounds, S1 normal and S2 normal. No murmur heard.   Pulmonary:     Effort: Pulmonary effort is normal. No respiratory distress.     Breath sounds: No wheezing.  Chest:     Chest wall: No tenderness.  Abdominal:     General: Bowel sounds are normal. Aorta is normal.     Palpations: Abdomen is soft.     Tenderness: There is no abdominal tenderness. There is no guarding or rebound.  Musculoskeletal:        General: Normal range of motion.     Cervical back: Normal range of motion and neck supple. No muscular tenderness.  Skin:    General: Skin is warm and dry.     Coloration: Skin is not pale.     Nails: There is no cyanosis.  Neurological:     Mental Status: She is alert.  Psychiatric:        Mood and Affect: Mood and affect normal.     ED Results / Procedures / Treatments   Labs (all labs ordered are listed, but only abnormal results are displayed) Labs Reviewed   BASIC METABOLIC PANEL - Abnormal; Notable for the following components:      Result Value   Glucose, Bld 104 (*)    Calcium 8.7 (*)    All other components within normal limits  CBC - Abnormal; Notable for the following components:   Hemoglobin 11.6 (*)    MCH 25.8 (*)    All other components within normal limits  TROPONIN I (HIGH SENSITIVITY) - Abnormal; Notable for the following components:   Troponin I (High Sensitivity) 4,623 (*)    All other components within normal limits  RESP PANEL BY RT-PCR (FLU A&B, COVID) ARPGX2  HEPARIN LEVEL (UNFRACTIONATED)  I-STAT BETA HCG BLOOD, ED ([facility], [facility], [facility] ONLY)  CBG MONITORING, ED  TROPONIN I (HIGH SENSITIVITY)    ED ECG REPORT   Date: 12/11/2019  Rate: 72  Rhythm: normal sinus rhythm  QRS Axis: normal  Intervals: normal  ST/T Wave abnormalities: nonspecific T wave changes  Conduction Disutrbances:none  Narrative Interpretation:   Old EKG Reviewed: unchanged  I have personally reviewed the EKG tracing and agree with the computerized printout as noted.  Radiology DG Chest 2 View  Result Date: 12/11/2019 CLINICAL DATA:  Chest pain. EXAM: CHEST - 2 VIEW COMPARISON:  April 22, 2004. FINDINGS: The heart size and mediastinal contours are within normal limits. Both lungs are clear. No pneumothorax or pleural effusion is noted. The visualized skeletal structures are unremarkable. IMPRESSION: No active cardiopulmonary disease. Electronically Signed   By: Marijo Conception M.D.   On: 12/11/2019 12:50    Procedures Procedures (including critical care time)  Medications Ordered in ED Medications  nitroGLYCERIN (NITROSTAT) SL tablet 0.4 mg (0.4 mg Sublingual Given 12/11/19 1354)  heparin ADULT infusion 100 units/mL (25000 units/236mL sodium chloride  0.45%) (1,200 Units/hr Intravenous New Bag/Given 12/11/19 1412)  aspirin chewable tablet 324 mg (324 mg Oral Given 12/11/19 1351)  heparin bolus via infusion 4,000 Units (4,000 Units Intravenous Bolus  from Bag 12/11/19 1409)    ED Course  I have reviewed the triage vital signs and the nursing notes.  Pertinent labs & imaging results that were available during my care of the patient were reviewed by me and considered in my medical decision making (see chart for details).  Patient seen and examined. Work-up initiated. EKG reviewed.   Vital signs reviewed and are as follows: BP (!) 162/74   Pulse 63   Temp 98.2 F (36.8 C) (Oral)   Resp 15   Ht 5\' 6"  (1.676 m)   Wt 117.9 kg   SpO2 99%   BMI 41.97 kg/m   Troponin 4623 -- likely NSTEMI. D/w and seen by Roberta [name]. ASA, NTG, heparin ordered. Pt continues to have pain. No STEMI.   2:11 PM Spoke with Trish -- cards will consult.   2:17 PM Pain resolved after 1 NTG. Pt rechecked/stable -- talking on her phone in room, no distress.   CRITICAL CARE Performed by: Carlisle Cater PA-C Total critical care time: 35 minutes Critical care time was exclusive of separately billable procedures and treating other patients. Critical care was necessary to treat or prevent imminent or life-threatening deterioration. Critical care was time spent personally by me on the following activities: development of treatment plan with patient and/or surrogate as well as nursing, discussions with consultants, evaluation of patient's response to treatment, examination of patient, obtaining history from patient or surrogate, ordering and performing treatments and interventions, ordering and review of laboratory studies, ordering and review of radiographic studies, pulse oximetry and re-evaluation of patient's condition.   Clinical Course as of 12/11/19 1356  Thu Dec 11, 2019  1348 54 yo female presenting to the ED with chest pain, found to have an NSTEMI with initail trop 4623.  ECG personally reviewed showing no STEMI at this time.  She is still having mild to moderate chest discomfort.  Aspirin, nitro, and IV heparin ordered.  Cardiology to be consulted, and she  will be admitted to the hospital.  Vitals stable here [MT]    Clinical Course User Index [MT] Trifan, Carola Rhine, MD   MDM Rules/Calculators/A&P                          Admit.    Final Clinical Impression(s) / ED Diagnoses Final diagnoses:  NSTEMI (non-ST elevated myocardial infarction) [facility] [place])    Rx / [place] Orders ED Discharge Orders    None         Carlisle Cater, PA-C 12/11/19 1515    Wyvonnia Dusky, MD 12/11/19 1642

## 2019-12-11 NOTE — ED Notes (Signed)
Per PA Geiple, ok to not start nitro drip at this time.  Gave 1 nitro sublingual and CP has subsided.

## 2019-12-11 NOTE — Telephone Encounter (Signed)
Pt states she was on her way to UC.  Advised to f/u as needed per recommendation of UC provider.  Pt verb understanding.

## 2019-12-11 NOTE — ED Notes (Signed)
All belongings with patient  Transported to cath lab

## 2019-12-11 NOTE — Progress Notes (Signed)
ANTICOAGULATION CONSULT NOTE - Initial Consult  Pharmacy Consult for heparin Indication: chest pain/ACS  Allergies  Allergen Reactions  . Lisinopril Swelling    Swelling of the tongue  . Penicillins     UNSPECIFIED CHILDHOOD REACTION  . Norvasc [Amlodipine Besylate] Rash  . Red Dye Rash  . Tetanus Toxoids Swelling    Swelling at site of injection    Patient Measurements: Height: 5\' 6"  (167.6 cm) Weight: 117.9 kg (260 lb) IBW/kg (Calculated) : 59.3 Heparin Dosing Weight: 87.3 kg  Vital Signs: Temp: 98.2 F (36.8 C) (12/09 1316) Temp Source: Oral (12/09 1316) BP: 162/74 (12/09 1330) Pulse Rate: 63 (12/09 1330)  Labs: Recent Labs    12/11/19 1200  HGB 11.6*  HCT 37.6  PLT 226  CREATININE 0.94  TROPONINIHS 4,623*    Estimated Creatinine Clearance: 87.2 mL/min (by C-G formula based on SCr of 0.94 mg/dL).   Medical History: Past Medical History:  Diagnosis Date  . Back pain   . 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 Depression   . Hip pain   . Hypertension   . Obesity   . PONV (postoperative nausea and vomiting)   . Prediabetes   . Swelling    bilat LE  . Wears glasses     Medications:  (Not in a hospital admission)   Assessment: 36 YOF who presents with chest pain and elevated troponin. Pharmacy consulted to start IV heparin.   Hgb slightly low. Plt wnl. SCr wnl  Goal of Therapy:  Heparin level 0.3-0.7 units/ml Monitor platelets by anticoagulation protocol: Yes   Plan:  -Heparin IV bolus 4000 units followed by IV heparin infusion at 1200 units/hr -F/u 6 hr HL -Monitor daily HL, CBC and s/s of bleeding   Albertina Parr, PharmD., BCPS, BCCCP Clinical Pharmacist Please refer to [facility] for unit-specific pharmacist

## 2019-12-11 NOTE — ED Notes (Signed)
Patient able to ambulate independently  

## 2019-12-11 NOTE — H&P (Addendum)
Cardiology Admission History and Physical:   Patient ID: Roberta [name] MRN: [id number]; DOB: 17-Sep-1965   Admission date: 12/11/2019  Primary Care Provider: [name], MD [facility] Cardiologist: Kirk Ruths, MD  [facility] Electrophysiologist:  None   Chief Complaint:  Chest pain  Patient Profile:   Roberta [name] is a 54 y.o. female with PMH of breast Ca with left sided mastectomy (on tamoxifen), HTN, prediabetes, depression who presented with chest pain and found to have elevated troponin.   History of Present Illness:   Roberta [name] is a 54 yo female with PMH noted above. She denies ever having been seen by cardiology in the past. Does report family hx of CAD with mother having MI in her 4s, and sister having 3 MI within a year period back in 2016, in her mid 79s. States she was Dx with Breast Ca in august of last year and underwent a left sided mastectomy. 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   Reports being in her usual state of health until last evening. Came home from work and ate a burger around 5-6pm. Immediately developed centralized chest pain that was dull in nature. She thought this was related to GERD. Symptoms lingered throughout the evening. She was able to go to bed around 10pm and woke up around 2am with ongoing chest pain. Did drift back off to sleep and work up to get ready for work. Still had dull chest pain. Went to work for an [employer]. Called her PCP office with symptoms. Told a co-worker about her symptoms and they brought her to [facility]. She was then transferred down to [facility] for further work up.   In the ED her labs showed stable electrolytes, Cr 0.94, WBC 5.6, Hgb 11.6, hsTn [phone]. Neg pregnancy, COVID negative. CXR negative. EKG showed SR with nonspecific changes. She was started on IV heparin. Given SL nitro with improvement of symptoms but still with mild chest dullness at the time of  assessment.    Past Medical History:  Diagnosis Date  . Back pain   . 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 Depression   . Hip pain   . Hypertension   . Obesity   . PONV (postoperative nausea and vomiting)   . Prediabetes   . Swelling    bilat LE  . Wears glasses     Past Surgical History:  Procedure Laterality Date  . CHOLECYSTECTOMY    . COLONOSCOPY    . DILATION AND CURETTAGE OF UTERUS    . GASTRIC BYPASS    . KNEE SURGERY  2002   Ligament repair  . MASTECTOMY Left 2019  . MASTECTOMY W/ SENTINEL NODE BIOPSY Left 10/01/2018   Procedure: LEFT TOTAL MASTECTOMY WITH SENTINEL LYMPH NODE BIOPSY AND BLUE DYE INJECTION;  Surgeon: Fanny Skates, MD;  Location: [place];  Service: General;  Laterality: Left;     Medications Prior to Admission: Prior to Admission medications   Medication Sig Start Date End Date Taking? Authorizing Provider  gabapentin (NEURONTIN) 300 MG capsule Take 1 capsule (300 mg total) by mouth at bedtime. 09/23/19   Magrinat, Virgie Dad, MD  hydrochlorothiazide (HYDRODIURIL) 12.5 MG tablet Take 1 tablet (12.5 mg total) by mouth daily. 12/04/19   Opalski, Neoma Laming, DO  metFORMIN (GLUCOPHAGE) 500 MG tablet Take 1 tablet (500 mg total) by mouth every morning. 12/04/19   Opalski, Deborah, DO  methylPREDNISolone (MEDROL DOSEPAK) 4 MG  TBPK tablet 24 mg PO on day 1, then decr. by 4 mg/day x5 days 11/21/19   [name], MD  Multiple Vitamin (MULTIVITAMIN) tablet Take 1 tablet by mouth daily.    [provider]  mupirocin ointment (BACTROBAN) 2 % Apply 1 application topically 2 (two) times daily as needed. 01/09/19   Briscoe Deutscher, DO  tamoxifen (NOLVADEX) 20 MG tablet Take 1 tablet (20 mg total) by mouth daily. 09/23/19   Magrinat, Virgie Dad, MD  venlafaxine XR (EFFEXOR-XR) 75 MG 24 hr capsule Take 1 capsule (75 mg total) by mouth daily with breakfast. 09/23/19   Magrinat, Virgie Dad, MD  sertraline (ZOLOFT) 50 MG tablet Take 1 tablet (50 mg total) by mouth daily.  07/17/19 08/22/19  Laqueta Linden, MD     Allergies:    Allergies  Allergen Reactions  . Lisinopril Swelling    Swelling of the tongue  . Penicillins     UNSPECIFIED CHILDHOOD REACTION  . Norvasc [Amlodipine Besylate] Rash  . Red Dye Rash  . Tetanus Toxoids Swelling    Swelling at site of injection    Social History:   Social History   Socioeconomic History  . Marital status: Legally Separated    Spouse name: Not on file  . Number of children: Not on file  . Years of education: Not on file  . Highest education level: Not on file  Occupational History  . Not on file  Tobacco Use  . Smoking status: Never Smoker  . Smokeless tobacco: Never Used  Vaping Use  . Vaping Use: Never used  Substance and Sexual Activity  . Alcohol use: Yes    Comment: occasional wine  . Drug use: No  . Sexual activity: Not Currently  Other Topics Concern  . Not on file  Social History Narrative  . Not on file   Social Determinants of Health   Financial Resource Strain: Not on file  Food Insecurity: Not on file  Transportation Needs: Not on file  Physical Activity: Not on file  Stress: Not on file  Social Connections: Not on file  Intimate Partner Violence: Not on file    Family History:   The patient's family history includes Cancer in her maternal aunt and maternal uncle; Diabetes in her mother; Heart disease in her mother; Hypertension in her mother; Kidney disease in her mother; Obesity in her mother; Stroke in her mother; Throat cancer in her maternal aunt.    ROS:  Please see the history of present illness.  All other ROS reviewed and negative.     Physical Exam/Data:   Vitals:   12/11/19 1445 12/11/19 1500 12/11/19 1515 12/11/19 1530  BP: (!) 166/89 (!) 157/90 (!) 157/104 (!) 141/116  Pulse: 64 68 68 66  Resp: 19 14 11  (!) 21  Temp:      TempSrc:      SpO2: 97% 100% 100% 100%  Weight:      Height:       No intake or output data in the 24 hours ending 12/11/19  1548 Last 3 Weights 12/11/2019 12/04/2019 11/13/2019  Weight (lbs) 260 lb 261 lb 261 lb  Weight (kg) 117.935 kg 118.389 kg 118.389 kg     Body mass index is 41.97 kg/m.  General:  Well nourished, well developed, in no acute distress HEENT: normal Lymph: no adenopathy Neck: no JVD Endocrine:  No thryomegaly Vascular: No carotid bruits; FA pulses 2+ bilaterally without bruits  Cardiac:  normal S1, S2; RRR;  no murmur  Lungs:  clear to auscultation bilaterally, no wheezing, rhonchi or rales  Abd: soft, nontender, no hepatomegaly  Ext: no edema Musculoskeletal:  No deformities, BUE and BLE strength normal and equal Skin: warm and dry  Neuro:  CNs 2-12 intact, no focal abnormalities noted Psych:  Normal affect    EKG:  The ECG that was done 12/9 was personally reviewed and demonstrates SR with nonspecific changes  Relevant CV Studies:  N/a   Laboratory Data:  High Sensitivity Troponin:   Recent Labs  Lab 12/11/19 1200 12/11/19 1408  TROPONINIHS 4,623* 6,812*      Chemistry Recent Labs  Lab 12/11/19 1200  NA 136  K 3.6  CL 100  CO2 27  GLUCOSE 104*  BUN 19  CREATININE 0.94  CALCIUM 8.7*  GFRNONAA >60  ANIONGAP 9    No results for input(s): PROT, ALBUMIN, AST, ALT, ALKPHOS, BILITOT in the last 168 hours. Hematology Recent Labs  Lab 12/11/19 1200  WBC 5.6  RBC 4.50  HGB 11.6*  HCT 37.6  MCV 83.6  MCH 25.8*  MCHC 30.9  RDW 15.5  PLT 226   BNPNo results for input(s): BNP, PROBNP in the last 168 hours.  DDimer No results for input(s): DDIMER in the last 168 hours.   Radiology/Studies:  DG Chest 2 View  Result Date: 12/11/2019 CLINICAL DATA:  Chest pain. EXAM: CHEST - 2 VIEW COMPARISON:  April 22, 2004. FINDINGS: The heart size and mediastinal contours are within normal limits. Both lungs are clear. No pneumothorax or pleural effusion is noted. The visualized skeletal structures are unremarkable. IMPRESSION: No active cardiopulmonary disease. Electronically  Signed   By: Marijo Conception M.D.   On: 12/11/2019 12:50     Assessment and Plan:   Darling Cieslewicz is a 54 y.o. female with PMH of breast Ca with left sided mastectomy (on tamoxifen), , HTN, prediabetes, depression who presented with chest pain and found to have elevated troponin.   1. NSTEMI: hsTn up to 6812, she has been placed on IV heparin. Given SL nitro with improvement in symptoms but still a dull ache noted. EKG without acute ischemia. Strong family hx. She will further cardiac evaluation with cardiac cath.  -- continue IV heparin -- The patient understands that risks included but are not limited to stroke (1 in 1000), death (1 in 1000), kidney failure [usually temporary] (1 in 500), bleeding (1 in 200), allergic reaction [possibly serious] (1 in 200).  -- start ASA, statin, and BB -- echo -- check lipids  2. HTN: reports she had been on Bp meds but has been following in the health and wellness clinic and blood pressures had improved. Meds were stopped, except HTCZ 12.5mg  daily. She is hypertensive in the ED.   -- start metoprolol 25mg  BID  3. Prediabetes: says last check of her HgbA1c was 5.6 with per PCP -- hold metformin -- SSI while inpatient   4. Breast Ca s/p mastectomy: she is maintained on tamoxifen [id number]} TIMI Risk Score for Unstable Angina or Non-ST Elevation MI:   The patient's TIMI risk score is 2, which indicates a 8% risk of all cause mortality, new or recurrent myocardial infarction or need for urgent revascularization in the next 14 days.  Severity of Illness: The appropriate patient status for this patient is INPATIENT. Inpatient status is judged to be reasonable and necessary in order to provide the required intensity of service to ensure the patient's safety. The patient's presenting symptoms, physical  exam findings, and initial radiographic and laboratory data in the context of their chronic comorbidities is felt to place them at high risk for further  clinical deterioration. Furthermore, it is not anticipated that the patient will be medically stable for discharge from the hospital within 2 midnights of admission. The following factors support the patient status of inpatient.   " The patient's presenting symptoms include chest pain. " The worrisome physical exam findings include stable exam. " The initial radiographic and laboratory data are worrisome because of elevated troponin. " The chronic co-morbidities include HTN, prediabetes, Breast Ca   * I certify that at the point of admission it is my clinical judgment that the patient will require inpatient hospital care spanning beyond 2 midnights from the point of admission due to high intensity of service, high risk for further deterioration and high frequency of surveillance required.*    For questions or updates, please contact [place] Please consult [email or web address] for contact info under     Signed, Reino Bellis, NP  12/11/2019 3:48 PM  As above, patient seen and examined.  Briefly she is a 54 year old female with past medical history of breast cancer status post left mastectomy, hypertension, prediabetes with non-ST elevation myocardial infarction.  Patient developed chest pain approximately 8 PM last evening.  It was described as someone sitting on her chest.  There was no radiation.  No associated symptoms.  Not pleuritic or positional.  Pain persisted throughout the night and this morning.  She therefore presented to the emergency room and cardiology is asked to evaluate.  She continues to have mild pain at present. Troponins are 4623 and 6812.  Creatinine 0.94.  Hemoglobin 11.6.  Beta hCG negative.  Chest x-ray without infiltrate.  Electrocardiogram shows sinus rhythm with PVC and nonspecific inferolateral ST changes. 1 non-ST elevation myocardial infarction-patient has ruled in.  She has persistent pain.  Plan urgent cardiac catheterization.  The risks and benefits including  myocardial infarction, CVA and death discussed and she agrees to proceed.  We will treat with aspirin, heparin, low-dose beta-blocker and statin.  Check echocardiogram for LV function.  2 hypertension-blood pressure is mildly elevated.  We will follow with the addition of beta-blockade and adjust regimen as needed.  3 prediabetes-follow CBGs.  Hold Glucophage for 48 hours following catheterization.  4 breast cancer status postmastectomy-patient on tamoxifen.  Kirk Ruths, MD

## 2019-12-11 NOTE — ED Notes (Signed)
PA made aware via Epic text of trop [phone]

## 2019-12-11 NOTE — Telephone Encounter (Signed)
    Patient calling to report overnight she began to have chest pain  Call transferred to [facility]

## 2019-12-12 ENCOUNTER — Telehealth: Payer: Self-pay | Admitting: Cardiology

## 2019-12-12 ENCOUNTER — Encounter (HOSPITAL_COMMUNITY): Payer: Self-pay | Admitting: Internal Medicine

## 2019-12-12 ENCOUNTER — Inpatient Hospital Stay (HOSPITAL_COMMUNITY): Payer: BC Managed Care – PPO

## 2019-12-12 ENCOUNTER — Encounter (INDEPENDENT_AMBULATORY_CARE_PROVIDER_SITE_OTHER): Payer: Self-pay | Admitting: Family Medicine

## 2019-12-12 DIAGNOSIS — R079 Chest pain, unspecified: Secondary | ICD-10-CM

## 2019-12-12 DIAGNOSIS — I2510 Atherosclerotic heart disease of native coronary artery without angina pectoris: Secondary | ICD-10-CM

## 2019-12-12 DIAGNOSIS — Z9861 Coronary angioplasty status: Secondary | ICD-10-CM

## 2019-12-12 LAB — BASIC METABOLIC PANEL
Anion gap: 9 (ref 5–15)
BUN: 12 mg/dL (ref 6–20)
CO2: 24 mmol/L (ref 22–32)
Calcium: 8.3 mg/dL — ABNORMAL LOW (ref 8.9–10.3)
Chloride: 105 mmol/L (ref 98–111)
Creatinine, Ser: 0.85 mg/dL (ref 0.44–1.00)
GFR, Estimated: >60 mL/min (ref >60)
Glucose, Bld: 119 mg/dL — ABNORMAL HIGH (ref 70–99)
Potassium: 3 mmol/L — ABNORMAL LOW (ref 3.5–5.1)
Sodium: 138 mmol/L (ref 135–145)

## 2019-12-12 LAB — HEMOGLOBIN A1C
Hgb A1c MFr Bld: 5.9 % — ABNORMAL HIGH (ref 4.8–5.6)
Mean Plasma Glucose: 122.63 mg/dL

## 2019-12-12 LAB — GLUCOSE, CAPILLARY
Glucose-Capillary: 97 mg/dL (ref 70–99)
Glucose-Capillary: 97 mg/dL (ref 70–99)
Glucose-Capillary: 84 mg/dL (ref 70–99)

## 2019-12-12 LAB — ECHOCARDIOGRAM COMPLETE
Area-P 1/2: 3.03 cm2
Height: 66 in
S' Lateral: 3 cm
Weight: 4148.18 oz

## 2019-12-12 LAB — LIPID PANEL
Cholesterol: 116 mg/dL (ref 0–200)
HDL: 57 mg/dL (ref >40)
LDL Cholesterol: 42 mg/dL (ref 0–99)
Total CHOL/HDL Ratio: 2 RATIO
Triglycerides: 83 mg/dL (ref <150)
VLDL: 17 mg/dL (ref 0–40)

## 2019-12-12 LAB — CBC
HCT: 33.3 % — ABNORMAL LOW (ref 36.0–46.0)
Hemoglobin: 10.7 g/dL — ABNORMAL LOW (ref 12.0–15.0)
MCH: 26 pg (ref 26.0–34.0)
MCHC: 32.1 g/dL (ref 30.0–36.0)
MCV: 80.8 fL (ref 80.0–100.0)
Platelets: 147 10*3/uL — ABNORMAL LOW (ref 150–400)
RBC: 4.12 MIL/uL (ref 3.87–5.11)
RDW: 15.6 % — ABNORMAL HIGH (ref 11.5–15.5)
WBC: 3.4 10*3/uL — ABNORMAL LOW (ref 4.0–10.5)
nRBC: 0 % (ref 0.0–0.2)

## 2019-12-12 LAB — POTASSIUM: Potassium: 4 mmol/L (ref 3.5–5.1)

## 2019-12-12 LAB — HIV ANTIBODY (ROUTINE TESTING W REFLEX): HIV Screen 4th Generation wRfx: NONREACTIVE

## 2019-12-12 MED ORDER — HYDROCHLOROTHIAZIDE 12.5 MG PO CAPS
12.5000 mg | ORAL_CAPSULE | Freq: Every day | ORAL | Status: DC
  Administered 2019-12-12: 12.5 mg via ORAL
  Filled 2019-12-12: qty 1

## 2019-12-12 MED ORDER — ATORVASTATIN CALCIUM 80 MG PO TABS: 80.0000 mg | ORAL_TABLET | Freq: Every day | ORAL | 3 refills | Status: DC

## 2019-12-12 MED ORDER — ASPIRIN 81 MG PO TBEC: 81.0000 mg | DELAYED_RELEASE_TABLET | Freq: Every day | ORAL | 11 refills | Status: AC

## 2019-12-12 MED ORDER — METOPROLOL TARTRATE 25 MG PO TABS: 25.0000 mg | ORAL_TABLET | Freq: Two times a day (BID) | ORAL | 6 refills | Status: DC

## 2019-12-12 MED ORDER — NITROGLYCERIN 0.4 MG SL SUBL: 0.4000 mg | SUBLINGUAL_TABLET | SUBLINGUAL | 3 refills | Status: DC | PRN

## 2019-12-12 MED ORDER — POTASSIUM CHLORIDE CRYS ER 10 MEQ PO TBCR
40.0000 meq | EXTENDED_RELEASE_TABLET | Freq: Every day | ORAL | Status: DC
  Administered 2019-12-12: 40 meq via ORAL
  Filled 2019-12-12: qty 4

## 2019-12-12 MED ORDER — TICAGRELOR 90 MG PO TABS: 90.0000 mg | ORAL_TABLET | Freq: Two times a day (BID) | ORAL | 6 refills | Status: DC

## 2019-12-12 MED ORDER — POTASSIUM CHLORIDE CRYS ER 10 MEQ PO TBCR
40.0000 meq | EXTENDED_RELEASE_TABLET | Freq: Every day | ORAL | Status: DC
  Administered 2019-12-12: 40 meq via ORAL
  Filled 2019-12-12 (×3): qty 4

## 2019-12-12 MED FILL — METOPROLOL TARTRATE 25 MG T: 25 | 30 days supply | Qty: 60 | Fill #0

## 2019-12-12 MED FILL — ATORVASTATIN CALCIUM 80 MG: 80 | 30 days supply | Qty: 30 | Fill #0

## 2019-12-12 MED FILL — ASPIRIN LOW DOSE 81 MG TBEC: 81 | 30 days supply | Qty: 30 | Fill #0

## 2019-12-12 MED FILL — BRILINTA 90 MG TABLET: 90 | 30 days supply | Qty: 60 | Fill #0

## 2019-12-12 MED FILL — NITROGLYCERIN 0.4 MG TAB SL: 0.4 | 8 days supply | Qty: 25 | Fill #0

## 2019-12-12 NOTE — Telephone Encounter (Signed)
TOC Patient-  Please call Patient-  Pt have an appt with Lurena Joiner on 12-23-19

## 2019-12-12 NOTE — Progress Notes (Addendum)
Nutrition Education Note  RD consulted for nutrition education regarding a Heart Healthy diet. Patient says that she is followed at an outpatient doctor's office for weight loss. She has been seeing them for ~2 years now. She follows up with them regularly. She has lost weight and is trying to lose 55 more lbs so she can have breast reconstruction surgery.   Lipid Panel     Component Value Date/Time   CHOL 116 12/12/2019 0055   CHOL 138 08/07/2019 1432   TRIG 83 12/12/2019 0055   HDL 57 12/12/2019 0055   HDL 69 08/07/2019 1432   CHOLHDL 2.0 12/12/2019 0055   VLDL 17 12/12/2019 0055   LDLCALC 42 12/12/2019 0055   LDLCALC 54 08/07/2019 1432    Reviewed patient's dietary recall. Patient has an overall healthy diet that is not high in fat or sodium. Provided examples on ways to decrease sodium and fat intake in diet. Discouraged intake of processed foods and use of salt shaker. Encouraged fresh fruits and vegetables as well as whole grain sources of carbohydrates to maximize fiber intake. Teach back method used.  Expect good compliance.  Body mass index is 41.85 kg/m. Pt meets criteria for class 3, extreme/morbid obesity based on current BMI.  RD attached "Heart Healthy Low Sodium Nutrition Therapy" handout from the Academy of [employer] to d/c instructions. RN to print for patient to review.   Current diet order is heart healthy, patient is consuming approximately 50-100% of meals at this time. Labs and medications reviewed. No further nutrition interventions warranted at this time. RD contact information provided. If additional nutrition issues arise, please re-consult RD.  Lucas Mallow, RD, LDN, CNSC Please refer to [facility] for contact information.

## 2019-12-12 NOTE — Discharge Instructions (Addendum)
PLEASE REMEMBER TO BRING ALL OF YOUR MEDICATIONS TO EACH OF YOUR FOLLOW-UP OFFICE VISITS.  PLEASE ATTEND ALL SCHEDULED FOLLOW-UP APPOINTMENTS.   Activity: Increase activity slowly as tolerated. You may shower, but no soaking baths (or swimming) for 1 week. No driving for 24 hours. No lifting over 5 lbs for 1 week. No sexual activity for 1 week.   You May Return to Work: in 1 week (if applicable)  Wound Care: You may wash cath site gently with soap and water. Keep cath site clean and dry. If you notice pain, swelling, bleeding or pus at your cath site, please call [phone].  It is very important that you take your aspirin and brilinta as prescribed - these medications work to keep the plaque from blocking your artery again.  You can restart your metformin 12/15/19 as previously prescribed.   Please keep a log of your blood pressures to bring to your outpatient follow-up appointment.   Radial Site Care  This sheet gives you information about how to care for yourself after your procedure. Your health care provider may also give you more specific instructions. If you have problems or questions, contact your health care provider. What can I expect after the procedure? After the procedure, it is common to have:  Bruising and tenderness at the catheter insertion area. Follow these instructions at home: Medicines  Take over-the-counter and prescription medicines only as told by your health care provider. Insertion site care  Follow instructions from your health care provider about how to take care of your insertion site. Make sure you: ? Wash your hands with soap and water before you change your bandage (dressing). If soap and water are not available, use hand sanitizer. ? Change your dressing as told by your health care provider. ? Leave stitches (sutures), skin glue, or adhesive strips in place. These skin closures may need to stay in place for 2 weeks or longer. If adhesive strip  edges start to loosen and curl up, you may trim the loose edges. Do not remove adhesive strips completely unless your health care provider tells you to do that.  Check your insertion site every day for signs of infection. Check for: ? Redness, swelling, or pain. ? Fluid or blood. ? Pus or a bad smell. ? Warmth.  Do not take baths, swim, or use a hot tub until your health care provider approves.  You may shower 24-48 hours after the procedure, or as directed by your health care provider. ? Remove the dressing and gently wash the site with plain soap and water. ? Pat the area dry with a clean towel. ? Do not rub the site. That could cause bleeding.  Do not apply powder or lotion to the site. Activity   For 24 hours after the procedure, or as directed by your health care provider: ? Do not flex or bend the affected arm. ? Do not push or pull heavy objects with the affected arm. ? Do not drive yourself home from the hospital or clinic. You may drive 24 hours after the procedure unless your health care provider tells you not to. ? Do not operate machinery or power tools.  Do not lift anything that is heavier than 10 lb (4.5 kg), or the limit that you are told, until your health care provider says that it is safe.  Ask your health care provider when it is okay to: ? Return to work or school. ? Resume usual physical activities or sports. ? Resume  sexual activity. General instructions  If the catheter site starts to bleed, raise your arm and put firm pressure on the site. If the bleeding does not stop, get help right away. This is a medical emergency.  If you went home on the same day as your procedure, a responsible adult should be with you for the first 24 hours after you arrive home.  Keep all follow-up visits as told by your health care provider. This is important. Contact a health care provider if:  You have a fever.  You have redness, swelling, or yellow drainage around your  insertion site. Get help right away if:  You have unusual pain at the radial site.  The catheter insertion area swells very fast.  The insertion area is bleeding, and the bleeding does not stop when you hold steady pressure on the area.  Your arm or hand becomes pale, cool, tingly, or numb. These symptoms may represent a serious problem that is an emergency. Do not wait to see if the symptoms will go away. Get medical help right away. Call your local emergency services (911 in the [place] Do not drive yourself to the hospital. Summary  After the procedure, it is common to have bruising and tenderness at the site.  Follow instructions from your health care provider about how to take care of your radial site wound. Check the wound every day for signs of infection.  Do not lift anything that is heavier than 10 lb (4.5 kg), or the limit that you are told, until your health care provider says that it is safe. This information is not intended to replace advice given to you by your health care provider. Make sure you discuss any questions you have with your health care provider. Document Revised: 01/24/2017 Document Reviewed: 01/24/2017 Elsevier Patient Education  2020 [employer] - Reduced Sodium Nutrition Therapy A heart-healthy diet is recommended to reduce your unhealthy blood cholesterol levels, manage high blood pressure, and lower your risk for heart disease. To follow a heart-healthy diet,  Eat a balanced diet with whole grains, fruits and vegetables, and lean protein sources.  Achieve and maintain a healthy weight.  Choose heart-healthy unsaturated fats. Limit saturated fats, trans fats, and cholesterol intake. Eat more plant-based or vegetarian meals using beans and soy foods for protein.  Eat whole, unprocessed foods to limit the amount of sodium (salt) you eat.  Limit refined carbohydrates especially sugar, sweets and sugar-sweetened beverages.  If you  drink alcohol, do so in moderation: one serving per day (women) and two servings per day (men). ? One serving is equivalent to 12 ounces beer, 5 ounces wine, or 1.5 ounces distilled spirits  Tips for Choosing Heart-Healthy Fats Choose lean protein and low-fat dairy foods to reduce saturated fat intake.  Saturated fat is usually found in animal-based protein and is associated with certain health risks. Saturated fat is the biggest contributor to raised low-density lipoprotein (LDL) cholesterol levels in the diet. Research shows that limiting saturated fat lowers unhealthy cholesterol levels. Eat no more than 7% of your total calories each day from saturated fat. Ask your RDN to help you determine how much saturated fat is right for you.  There are many foods that do not contain large amounts of saturated fats. Swapping these foods to replace foods high in saturated fats will help you limit the saturated fat you eat and improve your cholesterol levels. You can also try eating more plant-based or vegetarian meals. Instead  of. Try:  Whole milk, cheese, yogurt, and ice cream 1%, %, or skim milk, low-fat cheese, non-fat yogurt, and low-fat ice cream  Fatty, marbled beef and pork Lean beef, pork, or venison  Poultry with skin Poultry without skin  Butter, stick margarine Reduced-fat, whipped, or liquid spreads  Coconut oil, palm oil Liquid vegetable oils: corn, canola, olive, soybean and safflower oils   Avoid trans fats.  Trans fats increase levels of LDL-cholesterol. Hydrogenated fat in processed foods is the main source of trans fats in foods.  Trans fats can be found in stick margarine, shortening, processed sweets, baked goods, some fried foods, and packaged foods made with hydrogenated oils. Avoid foods with "partially hydrogenated oil" on the ingredient list such as: cookies, pastries, baked goods, biscuits, crackers, microwave popcorn, and frozen dinners.  Choose foods with heart healthy  fats.  Polyunsaturated and monounsaturated fat are unsaturated fats that may help lower your blood cholesterol level when used in place of saturated fat in your diet.  Ask your RDN about taking a dietary supplement with plant sterols and stanols to help lower your cholesterol level.  Research shows that substituting saturated fats with unsaturated fats is beneficial to cholesterol levels. Try these easy swaps:  Instead of. Try:  Butter, stick margarine, or solid shortening Reduced-fat, whipped, or liquid spreads  Beef, pork, or poultry with skin  Fish and seafood  Chips, crackers, snack foods Raw or unsalted nuts and seeds or nut butters Hummus with vegetables Avocado on toast  Coconut oil, palm oil Liquid vegetable oils: corn, canola, olive, soybean and safflower oils   Limit the amount of cholesterol you eat to less than 200 milligrams per day.  Cholesterol is a substance carried through the bloodstream via lipoproteins, which are known as "transporters" of fat. Some body functions need cholesterol to work properly, but too much cholesterol in the bloodstream can damage arteries and build up blood vessel linings (which can lead to heart attack and stroke). You should eat less than 200 milligrams cholesterol per day.  People respond differently to eating cholesterol. There is no test available right now that can figure out which people will respond more to dietary cholesterol and which will respond less. For individuals with high intake of dietary cholesterol, different types of increase (none, small, moderate, large) in LDL-cholesterol levels are all possible.   Food sources of cholesterol include egg yolks and organ meats such as liver, gizzards. Limit egg yolks to two to four per week and avoid organ meats like liver and gizzards to control cholesterol intake.  Tips for Choosing Heart-Healthy Carbohydrates Consume foods rich in viscous (soluble) fiber  Viscous, or soluble, fiber  is found in the walls of plant cells. Viscous fiber is found only in plant-based foods--animal-based foods like meat or dairy products do not contain fiber. In the stomach, viscous fibers absorb water and swell to form a thick, jelly-like mass. This helps to lower your unhealthy cholesterol ? Rich sources of viscous fiber include asparagus, Brussels sprouts, sweet potatoes, turnips, apricots, mangoes, oranges, legumes, barley, oats, and oat bran.  Eat at least 5 to 10 grams of viscous fiber each day. As you increase your fiber intake gradually, also increase the amount of water you drink. This will help prevent constipation.  If you have difficulty achieving this goal, ask your RDN about fiber laxatives. Choose fiber supplements made with viscous fibers such as psyllium seed husks or methylcellulose to help lower unhealthy cholesterol. Limit refined carbohydrates   There  are three types of carbohydrates: starches, sugar, and fiber. Some carbohydrates occur naturally in food, like the starches in rice or corn or the sugars in fruits and milk. Refined carbohydrates--foods with high amounts of simple sugars--can raise triglyceride levels. High triglyceride levels are associated with coronary heart disease.  Some examples of refined carbohydrate foods are table sugar, sweets, and beverages sweetened with added sugar.  Tips for Reducing Sodium (Salt) Although sodium is important for your body to function, too much sodium can be harmful for people with high blood pressure. As sodium and fluid buildup in your tissues and bloodstream, your blood pressure increases. High blood pressure may cause damage to other organs and increase your risk for a stroke. Even if you take a pill for blood pressure or a water pill (diuretic) to remove fluid, it is still important to have less salt in your diet. Ask your doctor and RDN what amount of sodium is right for you.  Avoid processed foods. Eat more fresh  foods. ? Fresh fruits and vegetables are naturally low in sodium, as well as frozen vegetables and fruits that have no added juices or sauces. ? Fresh meats are lower in sodium than processed meats, such as bacon, sausage, and hotdogs. Read the nutrition label or ask your butcher to help you find a fresh meat that is low in sodium.  Eat less salt--at the table and when cooking. ? A single teaspoon of table salt has 2,300 mg of sodium. ? Leave the salt out of recipes for pasta, casseroles, and soups. ? Ask your RDN how to cook your favorite recipes without sodium  Be a smart shopper. ? Look for food packages that say "salt-free" or "sodium-free." These items contain less than 5 milligrams of sodium per serving. ? "Very low-sodium" products contain less than 35 milligrams of sodium per serving. ? "Low-sodium" products contain less than 140 milligrams of sodium per serving. ? Beware of "reduced salt" or "reduced sodium" products. These items may still be high in sodium. Check the nutrition label.  Add flavors to your food without adding sodium. ? Try lemon juice, lime juice, fruit juice or vinegar. ? Dry or fresh herbs add flavor. Try basil, bay leaf, dill, rosemary, parsley, sage, dry mustard, nutmeg, thyme, and paprika. ? Pepper, red pepper flakes, and cayenne pepper can add spice to your meals without adding sodium. Hot sauce contains sodium, but if you use just a drop or two, it will not add up to much. ? Buy a sodium-free seasoning blend or make your own at home. Additional Lifestyle Tips Achieve and maintain a healthy weight.  Talk with your RDN or your doctor about what is a healthy weight for you.  Set goals to reach and maintain that weight.  To lose weight, reduce your calorie intake along with increasing your physical activity. A weight loss of 10 to 15 pounds could reduce LDL-cholesterol by 5 milligrams per deciliter. Participate in physical activity.  Talk with your health  care team to find out what types of physical activity are best for you. Set a plan to get about 30 minutes of exercise on most days.  Foods Recommended Food Group Foods Recommended  Grains Grain foods including whole grains: whole wheat, barley, rye, buckwheat, corn, teff, quinoa, millet, amaranth, brown or wild rice, sorghum, and oats Processed whole grains such as pasta, rice, hot and cold cereals, and snacksthat contain less than 300 mg sodium per serving Whole grain bread, crackers, rolls, or pita with <  80 mg sodium per slice (Note: yeast breads usually have less sodium than those made with baking soda),  Home-made bread made with reduced-sodium baking soda  Protein Foods Fresh red meat: lean, trimmed cuts of beef, pork, or lamb  Fresh poultry: skinless chicken or [place] Fresh seafood: fish (particularly fatty fish: salmon, herring), shrimp, lobster, clams, and scallops  Eggs (2-4 per week), eggwhites or egg substitute Nuts and seeds (unsalted): peanuts, almonds, pistachios, and sunflower seeds, unsalted; peanut butter, almond butter, and sunflower seed butter, reduced sodium.  Soy foods: tofu, tempeh, or soynuts Meat alternatives: veggie burgers and sausages from plant protein without added sodium Legumes: such as dried beans, lentils, or peas at least a few times per week in place of other protein sources, unsalted  Dairy Skim, % or 1% milk, low-fat yogurt,low-sodium cheeses (Swiss cheese, ricotta cheese, and fresh mozzarella, low sodium cottage cheese) Fortified soymilk, almond milk, rice milk, hemp milk Frozen desserts ( cup) made from low-fat milk  Vegetables Fresh, frozen, and canned (unsalted) whole vegetables, including dark-green, red and orange vegetables, legumes (beans and peas), and starchy vegetables without added sauces, salt, or sodium; low-sodium vegetable juices  Fruits Fresh, frozen, canned and dried whole unsweetened fruits canned fruit packed in water or fruit juice  without added sugar; 100% fruit juice   Oils Unsaturated vegetable oils: Almond, avocado, canola, cashew, corn, grapeseed, olive, safflower, sesame, soybean, sunflower Margarines and spreads which list liquid vegetable oil as the first ingredient and does not contain trans fats (partially hydrogenated oil) Salad dressings made from oil and low in sodium (salt)  Avocado  Other Prepared foods, including soups, casseroles, and salads made from recommended ingredients and contain <600 mg sodium. Homemade soups, casseroles, entrees, and side dishes typically contain less sodium that prepared alternatives.  Homemade soups and sauces such as gravy Low-sodium crackers, chips, pretzels Low-sodium seasonings (ketchup, BBQ) Spices, herbs, Salt-free seasoning mixes and marinades Vinegar  Lemon or lime juice   Foods Not Recommended Food Group Foods Not Recommended  Grains Breakfast cereals, packaged baked goods, snack crackers, and prepared grains with more than 300 mg sodium per serving  Biscuits, cornbread, and other "quick" breads prepared with baking soda Breads or crackers topped with salt Bakery products, such as doughnuts, biscuits, croissants, danish pastries, pies, cookies Instant potatoes, noodles, rice, stuffing mix, or macaroni and cheese Snacks made with partially hydrogenated oils, including chips, cheese puffs, snack mixes, regular crackers, butter-flavored popcorn Prepackaged bread crumbs  Self-rising flours  Protein Foods Meats high in saturated fat such as ribs, t-bone steak, regular 70/30 hamburger Processed red meats, such as bacon, sausage, ham, pepperoni, hot dogs, corned beef, cured or smoked meats, canned meat, chili, vienna sausage, sardines, and spam with added sodium Deli meats, such as pastrami, bologna, or salami (made of meat or poultry) with added sodium Organ meat such as liver, gizzards, or sweetbread Preseasoned and precooked meats Poultry with skin or processed  poultry (chicken and [place]) with skin, breading, or high-sodium marinades or sauces Whole eggs or egg yolks (greater than 5 per week) [facility], poultry, or fish Smoked fish and meats Salted legumes, nuts, seeds, or nut/seed butters Meat alternatives with high levels of sodium (>300 mg per serving) or saturated fat (>5 g per serving)  Dairy Whole milk,?2% fat milk, or Buttermilk Cream, half-&-half Cream cheese, sour cream Regular and processed cheese or sauces Regular-sodium cottage cheese  Vegetables Canned or frozen vegetables with salt, fresh vegetables prepared with salt, butter, cheese, or cream sauce  Pickled vegetables such as olives, pickles, or sauerkraut Tomato or pasta sauce with high levels of salt (>300 mg per serving) [place] vegetables  Fruits None  Oils Solid shortening or partially hydrogenated oils Solid margarine made with hydrogenated or partially hydrogenated oils or trans fat Salted margarine that contains trans fats Butter (salted or unsalted) Salad dressings with trans fat or high levels of sodium ([place], blue cheese, [place], [place]) Tropical oils (coconut, palm, palm kernel oils)  Other Sugary and/or fatty desserts, candy, and other sweets Canned soups that are >600 mg of sodium Frozen meals and prepared sides that are >600 mg of sodium Store-bought egg beaters (with added sodium) Salts: sea salt, kosher salt, onion salt, and garlic salt, seasoning mixes containing salt Flavorings: bouillon cubes, catsup or ketchup, barbeque sauce, Worcestershire sauce, soy sauce, salsa, relish, teriyaki sauce

## 2019-12-12 NOTE — Progress Notes (Addendum)
Progress Note  Patient Name: Roberta [name] Date of Encounter: 12/12/2019  [facility] Cardiologist: Kirk Ruths, MD   Subjective   No CP or dyspnea  Inpatient Medications    Scheduled Meds: . aspirin EC  81 mg Oral Daily  . atorvastatin  80 mg Oral Daily  . enoxaparin (LOVENOX) injection  40 mg Subcutaneous Q24H  . gabapentin  300 mg Oral QHS  . insulin aspart  0-15 Units Subcutaneous TID WC  . metoprolol tartrate  25 mg Oral BID  . potassium chloride  40 mEq Oral Daily   And  . potassium chloride  40 mEq Oral Daily  . sodium chloride flush  3 mL Intravenous Q12H  . sodium chloride flush  3 mL Intravenous Q12H  . tamoxifen  20 mg Oral Daily  . ticagrelor  90 mg Oral BID  . venlafaxine XR  75 mg Oral Q breakfast   Continuous Infusions: . sodium chloride    . sodium chloride    . sodium chloride     PRN Meds: sodium chloride, sodium chloride, acetaminophen, diphenhydrAMINE, nitroGLYCERIN, ondansetron (ZOFRAN) IV, sodium chloride flush, sodium chloride flush   Vital Signs    Vitals:   12/11/19 2253 12/11/19 2353 12/12/19 0453 12/12/19 0518  BP: (!) 187/85 (!) 154/83 140/79   Pulse:   (!) 58   Resp:      Temp:   97.9 F (36.6 C)   TempSrc:   Oral   SpO2:   100%   Weight:    117.6 kg  Height:        Intake/Output Summary (Last 24 hours) at 12/12/2019 0809 Last data filed at 12/12/2019 0600 Gross per 24 hour  Intake 650 ml  Output --  Net 650 ml   Last 3 Weights 12/12/2019 12/11/2019 12/04/2019  Weight (lbs) 259 lb 4.2 oz 260 lb 261 lb  Weight (kg) 117.6 kg 117.935 kg 118.389 kg      Telemetry    Sinus - Personally Reviewed   Physical Exam   GEN: No acute distress.   Neck: No JVD Cardiac: RRR, no murmurs, rubs, or gallops.  Respiratory: Clear to auscultation bilaterally. GI: Soft, nontender, non-distended  MS: No edema; Radial cath site with no hematoma Neuro:  Nonfocal  Psych: Normal affect   Labs    High Sensitivity Troponin:    Recent Labs  Lab 12/11/19 1200 12/11/19 1408  TROPONINIHS 4,623* 6,812*      Chemistry Recent Labs  Lab 12/11/19 1200 12/12/19 0055  NA 136 138  K 3.6 3.0*  CL 100 105  CO2 27 24  GLUCOSE 104* 119*  BUN 19 12  CREATININE 0.94 0.85  CALCIUM 8.7* 8.3*  GFRNONAA >60 >60  ANIONGAP 9 9     Hematology Recent Labs  Lab 12/11/19 1200 12/12/19 0055  WBC 5.6 3.4*  RBC 4.50 4.12  HGB 11.6* 10.7*  HCT 37.6 33.3*  MCV 83.6 80.8  MCH 25.8* 26.0  MCHC 30.9 32.1  RDW 15.5 15.6*  PLT 226 147*    Radiology    DG Chest 2 View  Result Date: 12/11/2019 CLINICAL DATA:  Chest pain. EXAM: CHEST - 2 VIEW COMPARISON:  April 22, 2004. FINDINGS: The heart size and mediastinal contours are within normal limits. Both lungs are clear. No pneumothorax or pleural effusion is noted. The visualized skeletal structures are unremarkable. IMPRESSION: No active cardiopulmonary disease. Electronically Signed   By: Marijo Conception M.D.   On: 12/11/2019 12:50  CARDIAC CATHETERIZATION  Result Date: 12/11/2019 Conclusions: 1. 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 2. Mildly reduced left ventricular systolic function with mid anterolateral hypokinesis.  Left ventricular filling pressure is normal. 3. Successful PTCA of proximal ramus intermedius with acceptable angiographic result and restoration of TIMI-3 flow.  Stent placement was not performed due to small vessel size and tortuosity. Recommendations: 1. Dual antiplatelet therapy with aspirin and ticagrelor for 12 months. 2. Aggressive secondary prevention and medical therapy of residual ramus and rPDA disease. Roberta Bush, MD [facility]    Patient Profile     54 y.o. female admitted with non-ST elevation myocardial infarction.  Cardiac catheterization revealed occlusion of small ramus intermedius and 50% small PDA.  LV function mildly reduced.   Patient had PTCA of ramus intermedius.  Assessment & Plan    1 non-ST elevation myocardial infarction-patient doing well status post PTCA of ramus intermedius.   Continue aspirin, Brilinta, beta-blocker and statin.  Check echocardiogram for LV function.  2 hypertension-blood pressure is elevated; continue metoprolol.  Add hctz12.5 mg daily.  Follow blood pressure and adjust regimen as needed.  3 prediabetes-follow CBGs.    Resume home medications 48 hours after catheterization.  4 breast cancer status postmastectomy-patient on tamoxifen.  Ambulate this morning.  Await echocardiogram.  Probable discharge this evening if patient stable.  Continue present medications.  Follow-up APP 1 to 2 weeks.  Check bmet at that time.  Follow-up with me in 3 months. Greater than 30 minutes PA and physician time. D2  For questions or updates, please contact [place] Please consult [email or web address] for contact info under        Signed, Kirk Ruths, MD  12/12/2019, 8:09 AM

## 2019-12-12 NOTE — Discharge Summary (Signed)
Discharge Summary    Patient ID: Roberta [name] MRN: [id number]; DOB: Jul 30, 1965  Admit date: 12/11/2019 Discharge date: 12/12/2019  Primary Care Provider: [name], MD  Primary Cardiologist: Kirk Ruths, MD  Primary Electrophysiologist:  None   Discharge Diagnoses    Principal Problem:   NSTEMI (non-ST elevated myocardial infarction) [facility]) Active Problems:   Prediabetes   Malignant neoplasm of upper-inner quadrant of left breast in female, estrogen receptor positive [facility])   Essential hypertension   Coronary artery disease    Diagnostic Studies/Procedures    Echocardiogram 12/12/19: 1. Left ventricular ejection fraction, by estimation, is 65 to 70%. The  left ventricle has normal function. The left ventricle has no regional  wall motion abnormalities. Left ventricular diastolic parameters are  consistent with Grade I diastolic  dysfunction (impaired relaxation).  2. Right ventricular systolic function is normal. The right ventricular  size is normal.  3. The mitral valve is normal in structure. No evidence of mitral valve  regurgitation. No evidence of mitral stenosis.  4. The aortic valve was not well visualized. Aortic valve regurgitation  is not visualized. No aortic stenosis is present.  5. The inferior vena cava is normal in size with greater than 50%  respiratory variability, suggesting right atrial pressure of 3 mmHg.   LHC 12/11/19: Conclusions: 1. 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 2. Mildly reduced left ventricular systolic function with mid anterolateral hypokinesis.  Left ventricular filling pressure is normal. 3. Successful PTCA of proximal ramus intermedius with acceptable angiographic result and restoration of TIMI-3 flow.  Stent placement was not performed due to small vessel size and tortuosity.  Recommendations: 1. Dual antiplatelet  therapy with aspirin and ticagrelor for 12 months. 2. Aggressive secondary prevention and medical therapy of residual ramus and rPDA disease.  Diagnostic Dominance: Co-dominant    Intervention     _____________   History of Present Illness    Roberta [name] is a 54 y.o. female with PMH of breast Ca with left sided mastectomy (on tamoxifen), HTN, prediabetes, depression who presented with chest pain and found to have elevated troponin. She denies ever having been seen by cardiology in the past. Does report family hx of CAD with mother having MI in her 41s, and sister having 3 MI within a year period back in 2016, in her mid 53s. States she was Dx with Breast Ca in august of last year and underwent a left sided mastectomy. 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   Reports being in her usual state of health until last evening. Came home from work and ate a burger around 5-6pm. Immediately developed centralized chest pain that was dull in nature. She thought this was related to GERD. Symptoms lingered throughout the evening. She was able to go to bed around 10pm and woke up around 2am with ongoing chest pain. Did drift back off to sleep and work up to get ready for work. Still had dull chest pain. Went to work for an [employer]. Called her PCP office with symptoms. Told a co-worker about her symptoms and they brought her to [facility]. She was then transferred down to [facility] for further work up.   In the ED her labs showed stable electrolytes, Cr 0.94, WBC 5.6, Hgb 11.6, hsTn [phone]. Neg pregnancy, COVID negative. CXR negative. EKG showed SR with nonspecific changes. She was started on IV  heparin. Given SL nitro with improvement of symptoms but still with mild chest dullness at the time of assessment.   Hospital Course     Consultants: None   1. NSTEMI: patient presented with chest pain, found to have elevated HsTrop which peaked at 6812. EKG was  non-ischemic. She was started on a heparin gtt. She underwent LHC 12/11/19 which showed 2 vessel CAD with thrombotic occlusion of small RI (culprit lesion) and 50% stenosis of small rPDA. She underwent successful PTCA of pRI, though stent not placed due to small vessel size/tortuosity. Felt to have mildly reduced LV systolic function with mid anterolateral hypokinesis; normal LVEDP. She was started on aspirin and brilinta. Echocardiogram showed EF 65-70%, G1DD, no RWMA, and no significant valvular abnormalities. LDL 42, though in light of cath findings, she was started on atorvastatin 80mg  daily. - Continue aspirin and brilinta x12 months - Continue BBlocker - Continue statin  2. HTN: BP persistently elevated this admission. She was started on metoprolol tartrate 25mg  BID in addition to home HCTZ 12.5mg  daily, though BP remained elevated. She reports BP is well controlled at home.  - Continue metoprolol and HCTZ - Patient instructed to keep a log of her blood pressures to bring to her follow-up appointment  3. Pre-Dm type 2: A1C 5.9 this admission - Continue to encourage dietary/lifestyl modifications to promote weight loss and improved glycemic control  4. Breast Cancer: s/p mastectomy. Currently on tamoxifen - Continue tamoxifen   Did the patient have an acute coronary syndrome (MI, NSTEMI, STEMI, etc) this admission?:  Yes                               AHA/ACC Clinical Performance & Quality Measures: 1. Aspirin prescribed? - Yes 2. ADP Receptor Inhibitor (Plavix/Clopidogrel, Brilinta/Ticagrelor or Effient/Prasugrel) prescribed (includes medically managed patients)? - Yes 3. Beta Blocker prescribed? - Yes 4. High Intensity Statin (Lipitor 40-80mg  or Crestor 20-40mg ) prescribed? - Yes 5. EF assessed during THIS hospitalization? - Yes 6. For EF <40%, was ACEI/ARB prescribed? - Not Applicable (EF >/= 79%) 7. For EF <40%, Aldosterone Antagonist (Spironolactone or Eplerenone) prescribed? - Not  Applicable (EF >/= 89%) 8. Cardiac Rehab Phase II ordered (including medically managed patients)? - Yes       _____________  Discharge Vitals Blood pressure (!) 162/78, pulse 72, temperature 97.9 F (36.6 C), temperature source Oral, resp. rate 12, height 5\' 6"  (1.676 m), weight 117.6 kg, SpO2 100 %.  Filed Weights   12/11/19 1200 12/12/19 0518  Weight: 117.9 kg 117.6 kg    Labs & Radiologic Studies    CBC Recent Labs    12/11/19 1200 12/12/19 0055  WBC 5.6 3.4*  HGB 11.6* 10.7*  HCT 37.6 33.3*  MCV 83.6 80.8  PLT 226 211*   Basic Metabolic Panel Recent Labs    12/11/19 1200 12/12/19 0055 12/12/19 0941  NA 136 138  --   K 3.6 3.0* 4.0  CL 100 105  --   CO2 27 24  --   GLUCOSE 104* 119*  --   BUN 19 12  --   CREATININE 0.94 0.85  --   CALCIUM 8.7* 8.3*  --    Liver Function Tests No results for input(s): AST, ALT, ALKPHOS, BILITOT, PROT, ALBUMIN in the last 72 hours. No results for input(s): LIPASE, AMYLASE in the last 72 hours. High Sensitivity Troponin:   Recent Labs  Lab 12/11/19 1200 12/11/19 1408  TROPONINIHS 4,623* [id number]*    BNP Invalid input(s): POCBNP D-Dimer No results for input(s): DDIMER in the last 72 hours. Hemoglobin A1C Recent Labs    12/12/19 0055  HGBA1C 5.9*   Fasting Lipid Panel Recent Labs    12/12/19 0055  CHOL 116  HDL 57  LDLCALC 42  TRIG 83  CHOLHDL 2.0   Thyroid Function Tests No results for input(s): TSH, T4TOTAL, T3FREE, THYROIDAB in the last 72 hours.  Invalid input(s): FREET3 _____________  DG Chest 2 View  Result Date: 12/11/2019 CLINICAL DATA:  Chest pain. EXAM: CHEST - 2 VIEW COMPARISON:  April 22, 2004. FINDINGS: The heart size and mediastinal contours are within normal limits. Both lungs are clear. No pneumothorax or pleural effusion is noted. The visualized skeletal structures are unremarkable. IMPRESSION: No active cardiopulmonary disease. Electronically Signed   By: Marijo Conception M.D.   On:  12/11/2019 12:50   CARDIAC CATHETERIZATION  Result Date: 12/11/2019 Conclusions: 1. 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 2. Mildly reduced left ventricular systolic function with mid anterolateral hypokinesis.  Left ventricular filling pressure is normal. 3. Successful PTCA of proximal ramus intermedius with acceptable angiographic result and restoration of TIMI-3 flow.  Stent placement was not performed due to small vessel size and tortuosity. Recommendations: 1. Dual antiplatelet therapy with aspirin and ticagrelor for 12 months. 2. Aggressive secondary prevention and medical therapy of residual ramus and rPDA disease. [name], MD [facility]   ECHOCARDIOGRAM COMPLETE  Result Date: 12/12/2019    ECHOCARDIOGRAM REPORT   Patient Name:   Roberta [name] Date of Exam: 12/12/2019 Medical Rec #:  [id number]           Height:       66.0 in Accession #:    [id number]          Weight:       259.3 lb Date of Birth:  [birth year]           BSA:          2.233 m Patient Age:    25 years            BP:           154/83 mmHg Patient Gender: F                   HR:           68 bpm. Exam Location:  Inpatient Procedure: 2D Echo Indications:    Chest Pain R07.9  History:        Patient has no prior history of Echocardiogram examinations.                 Risk Factors:Hypertension.  Sonographer:    Mikki Santee RDCS (AE) Referring Phys: [place]  1. Left ventricular ejection fraction, by estimation, is 65 to 70%. The left ventricle has normal function. The left ventricle has no regional wall motion abnormalities. Left ventricular diastolic parameters are consistent with Grade I diastolic dysfunction (impaired relaxation).  2. Right ventricular systolic function is normal. The right ventricular size is normal.  3. The mitral valve is normal in structure. No evidence of mitral valve  regurgitation. No evidence of mitral stenosis.  4. The aortic valve was not well visualized. Aortic valve regurgitation is not visualized. No aortic stenosis is present.  5. The inferior vena cava is normal in size with greater  than 50% respiratory variability, suggesting right atrial pressure of 3 mmHg. FINDINGS  Left Ventricle: Left ventricular ejection fraction, by estimation, is 65 to 70%. The left ventricle has normal function. The left ventricle has no regional wall motion abnormalities. The left ventricular internal cavity size was normal in size. There is  no left ventricular hypertrophy. Left ventricular diastolic parameters are consistent with Grade I diastolic dysfunction (impaired relaxation). Right Ventricle: The right ventricular size is normal. No increase in right ventricular wall thickness. Right ventricular systolic function is normal. Left Atrium: Left atrial size was normal in size. Right Atrium: Right atrial size was normal in size. Pericardium: There is no evidence of pericardial effusion. Mitral Valve: The mitral valve is normal in structure. No evidence of mitral valve regurgitation. No evidence of mitral valve stenosis. Tricuspid Valve: The tricuspid valve is normal in structure. Tricuspid valve regurgitation is trivial. No evidence of tricuspid stenosis. Aortic Valve: The aortic valve was not well visualized. Aortic valve regurgitation is not visualized. No aortic stenosis is present. Pulmonic Valve: The pulmonic valve was normal in structure. Pulmonic valve regurgitation is not visualized. No evidence of pulmonic stenosis. Aorta: The aortic root is normal in size and structure. Venous: The inferior vena cava is normal in size with greater than 50% respiratory variability, suggesting right atrial pressure of 3 mmHg. IAS/Shunts: No atrial level shunt detected by color flow Doppler.  LEFT VENTRICLE PLAX 2D LVIDd:         4.70 cm  Diastology LVIDs:         3.00 cm  LV e' medial:    7.40 cm/s  LV PW:         1.00 cm  LV E/e' medial:  8.2 LV IVS:        1.00 cm  LV e' lateral:   9.90 cm/s LVOT diam:     2.00 cm  LV E/e' lateral: 6.1 LV SV:         64 LV SV Index:   29 LVOT Area:     3.14 cm  RIGHT VENTRICLE RV S prime:     13.10 cm/s TAPSE (M-mode): 1.5 cm LEFT ATRIUM             Index       RIGHT ATRIUM           Index LA diam:        2.80 cm 1.25 cm/m  RA Area:     11.60 cm LA Vol (A2C):   37.3 ml 16.70 ml/m RA Volume:   21.30 ml  9.54 ml/m LA Vol (A4C):   35.4 ml 15.85 ml/m LA Biplane Vol: 36.4 ml 16.30 ml/m  AORTIC VALVE LVOT Vmax:   86.20 cm/s LVOT Vmean:  59.100 cm/s LVOT VTI:    0.203 m  AORTA Ao Root diam: 3.30 cm MITRAL VALVE MV Area (PHT): 3.03 cm    SHUNTS MV Decel Time: 250 msec    Systemic VTI:  0.20 m MV E velocity: 60.80 cm/s  Systemic Diam: 2.00 cm MV A velocity: 70.30 cm/s MV E/A ratio:  0.86 Candee Furbish MD Electronically signed by Candee Furbish MD Signature Date/Time: 12/12/2019/3:15:16 PM    Final    Disposition   Pt is being discharged home today in good condition.  Follow-up Plans & Appointments     Follow-up Information    Erlene Quan, PA-C Follow up on 12/23/2019.   Specialties: Cardiology, Radiology Why: Please arrive 15 minutes early for your 11:15am post-hospital  cardiology appointment Contact information: [address] [place]  [phone]              Discharge Instructions    Amb Referral to Cardiac Rehabilitation   Complete by: As directed    Diagnosis: NSTEMI   After initial evaluation and assessments completed: Virtual Based Care may be provided alone or in conjunction with Phase 2 Cardiac Rehab based on patient barriers.: Yes      Discharge Medications   Allergies as of 12/12/2019      Reactions   Lisinopril Swelling   Swelling of the tongue   Penicillins    UNSPECIFIED CHILDHOOD REACTION   Norvasc [amlodipine Besylate] Rash   Red Dye Rash   Tetanus Toxoids Swelling   Swelling at site of  injection      Medication List    TAKE these medications   aspirin 81 MG EC tablet Take 1 tablet (81 mg total) by mouth daily. Swallow whole. Start taking on: December 13, 2019   atorvastatin 80 MG tablet Commonly known as: LIPITOR Take 1 tablet (80 mg total) by mouth daily. Start taking on: December 13, 2019   CINNAMON PO Take 2 capsules by mouth daily.   gabapentin 300 MG capsule Commonly known as: NEURONTIN Take 1 capsule (300 mg total) by mouth at bedtime.   hydrochlorothiazide 12.5 MG tablet Commonly known as: HYDRODIURIL Take 1 tablet (12.5 mg total) by mouth daily.   metFORMIN 500 MG tablet Commonly known as: GLUCOPHAGE Take 1 tablet (500 mg total) by mouth every morning. What changed: when to take this Notes to patient: Restart this medication 12/15/19   metoprolol tartrate 25 MG tablet Commonly known as: LOPRESSOR Take 1 tablet (25 mg total) by mouth 2 (two) times daily.   multivitamin tablet Take 1 tablet by mouth daily.   mupirocin ointment 2 % Commonly known as: BACTROBAN Apply 1 application topically 2 (two) times daily as needed. What changed: reasons to take this   nitroGLYCERIN 0.4 MG SL tablet Commonly known as: NITROSTAT Place 1 tablet (0.4 mg total) under the tongue every 5 (five) minutes x 3 doses as needed for chest pain.   tamoxifen 20 MG tablet Commonly known as: NOLVADEX Take 1 tablet (20 mg total) by mouth daily.   ticagrelor 90 MG Tabs tablet Commonly known as: BRILINTA Take 1 tablet (90 mg total) by mouth 2 (two) times daily.   venlafaxine XR 75 MG 24 hr capsule Commonly known as: EFFEXOR-XR Take 1 capsule (75 mg total) by mouth daily with breakfast.          Outstanding Labs/Studies   None  Duration of Discharge Encounter   Greater than 30 minutes including physician time.  Signed, Abigail Butts, PA-C 12/12/2019, 4:12 PM

## 2019-12-12 NOTE — Telephone Encounter (Signed)
TOC Patient-  Please call Patient-  Pt have an appt with Kerin Ransom on 12-23-19

## 2019-12-12 NOTE — Progress Notes (Signed)
CARDIAC REHAB PHASE I   PRE:  Rate/Rhythm: 65 SR  BP:  Supine:   Sitting: 162/78  Standing:    SaO2: 99% RA  MODE:  Ambulation: 400 ft   POST:  Rate/Rhythm: 85 SR  BP:  Supine:   Sitting: 185/91  Standing:    SaO2: 97% RA  Pt was lying in bed and had not been walking since admission. She was independent with getting out of bed and with ambulation. She ambulated without any assistance for 400 ft. She had slow but stable gait. Returned pt to bed. BP was a little high after walking but she was asymptomatic. Discussed MI booklet, Restrictions, Aspirin and Brilinta, NTG use, Exercise guidelines, heart healthy diet, and CRP II. Pt has a [employer] and was active before MI. Pt denied CRP II due to working a full time job as a Pharmacist, hospital and will not be able to participate in rehab. Discussed importance of walking after discharge. Pt was receptive. Call bell within reach.  [place], ACSM CEP 9:36 AM 12/12/2019

## 2019-12-12 NOTE — Progress Notes (Signed)
  Echocardiogram 2D Echocardiogram has been performed.  Jennette Dubin 12/12/2019, 2:19 PM

## 2019-12-15 NOTE — Telephone Encounter (Signed)
[place] subscriber not available - please leave a message.  left message to call back to speak to a triage nurse. TOC call

## 2019-12-15 NOTE — Telephone Encounter (Signed)
Patient contacted regarding discharge from [facility] hospital 12/12/19.  Patient understands to follow up with Kerin Ransom PA 12/21 at 11:15 am. Patient understands discharge instructions. Patient understands medications and regiment. Patient understands to bring all medications to this visit.

## 2019-12-15 NOTE — Telephone Encounter (Signed)
Please advise 

## 2019-12-16 ENCOUNTER — Telehealth (HOSPITAL_COMMUNITY): Payer: Self-pay

## 2019-12-16 NOTE — Telephone Encounter (Signed)
Per phase I pt is not interested in CR at this time, she is a member at [employer].  Closed referral

## 2019-12-17 ENCOUNTER — Telehealth: Payer: Self-pay | Admitting: Cardiology

## 2019-12-17 MED ORDER — METOPROLOL TARTRATE 25 MG PO TABS: 12.5000 mg | ORAL_TABLET | Freq: Two times a day (BID) | ORAL | 6 refills | Status: DC

## 2019-12-17 NOTE — Telephone Encounter (Signed)
Spoke with pt, Aware of dr crenshaw's recommendations.  °

## 2019-12-17 NOTE — Telephone Encounter (Signed)
Pt c/o medication issue:  1. Name of Medication: atorvastatin (LIPITOR) 80 MG tablet metoprolol tartrate (LOPRESSOR) 25 MG tablet ticagrelor (BRILINTA) 90 MG TABS tablet  2. How are you currently taking this medication (dosage and times per day)? As perscribed  3. Are you having a reaction (difficulty breathing--STAT)? Low BP, light headed, feeling flushed, heart racing, took BP and it was low.  4. What is your medication issue? Patient is concerned that the medication she received when she left the hospital on 12/12/2019 is causing the above issues. Patient also gave BP readings; on today 12/16/2019 117/63 P 99. Yesterday's BP readings were  12/17/2019 114/60 P 83....103/57 P 86... 111/58 P 92. Patient states she was off BP meds before procedure and put back on after. Patient isn't sure which medication is causing issues.

## 2019-12-17 NOTE — Telephone Encounter (Signed)
Returned call to patient of Dr. Stanford Breed   This morning she felt better than she did yesterday morning but she has not felt well since starting new meds post-hospital  She states immediately after taking morning meds she felt bad - cinnamon, MVI, effexor, hctz, tamoxifen, ASA, atorvastatin, brilinta, metoprolol (last 4 new since heart attack)  She reports low BP, lightheadedness, flushed, heart racing  Prior to heart attack, she took losartan for BP (started at 100mg  weaned off). She was taken off this prior to heart attack.   Advised will send message to Dr. Stanford Breed & clinical pharmacy team (for med review, side effects) and we will contact her with recommendations

## 2019-12-17 NOTE — Telephone Encounter (Signed)
Decrease metoprolol to 12.5 mg twice daily and follow. Kirk Ruths

## 2019-12-23 ENCOUNTER — Encounter: Payer: Self-pay | Admitting: Cardiology

## 2019-12-23 ENCOUNTER — Encounter (INDEPENDENT_AMBULATORY_CARE_PROVIDER_SITE_OTHER): Payer: Self-pay | Admitting: Family Medicine

## 2019-12-23 ENCOUNTER — Ambulatory Visit (INDEPENDENT_AMBULATORY_CARE_PROVIDER_SITE_OTHER): Payer: BC Managed Care – PPO | Admitting: Cardiology

## 2019-12-23 ENCOUNTER — Ambulatory Visit (INDEPENDENT_AMBULATORY_CARE_PROVIDER_SITE_OTHER): Payer: BC Managed Care – PPO | Admitting: Family Medicine

## 2019-12-23 ENCOUNTER — Other Ambulatory Visit: Payer: Self-pay

## 2019-12-23 VITALS — BP 109/72 | HR 81 | Temp 98.1°F | Ht 66.0 in | Wt 255.0 lb

## 2019-12-23 VITALS — BP 132/86 | HR 64 | Ht 66.0 in | Wt 255.0 lb

## 2019-12-23 DIAGNOSIS — I10 Essential (primary) hypertension: Secondary | ICD-10-CM

## 2019-12-23 DIAGNOSIS — Z9189 Other specified personal risk factors, not elsewhere classified: Secondary | ICD-10-CM | POA: Insufficient documentation

## 2019-12-23 DIAGNOSIS — Z6841 Body Mass Index (BMI) 40.0 and over, adult: Secondary | ICD-10-CM

## 2019-12-23 DIAGNOSIS — I214 Non-ST elevation (NSTEMI) myocardial infarction: Secondary | ICD-10-CM

## 2019-12-23 DIAGNOSIS — I2510 Atherosclerotic heart disease of native coronary artery without angina pectoris: Secondary | ICD-10-CM

## 2019-12-23 DIAGNOSIS — R7303 Prediabetes: Secondary | ICD-10-CM

## 2019-12-23 DIAGNOSIS — Z853 Personal history of malignant neoplasm of breast: Secondary | ICD-10-CM | POA: Diagnosis not present

## 2019-12-23 DIAGNOSIS — Z9861 Coronary angioplasty status: Secondary | ICD-10-CM | POA: Diagnosis not present

## 2019-12-23 MED ORDER — METFORMIN HCL 500 MG PO TABS
500.0000 mg | ORAL_TABLET | Freq: Every morning | ORAL | 0 refills | Status: DC
Start: 2019-12-23 — End: 2020-01-08

## 2019-12-23 NOTE — Assessment & Plan Note (Addendum)
She had gastric bypass in 2005. BMI 41- she is now being followed by weight loss center

## 2019-12-23 NOTE — Assessment & Plan Note (Signed)
Thrombotic occlusion of small RI- POBA (too small to stent) Residual 50% PDA Normal LVF

## 2019-12-23 NOTE — Progress Notes (Signed)
Cardiology Office Note:    Date:  12/23/2019   ID:  Roberta [name], DOB January 05, 1965, MRN [id number]  PCP:  [name], MD  Cardiologist:  Kirk Ruths, MD  Electrophysiologist:  None   Referring MD: [name], MD   No chief complaint on file.   History of Present Illness:    Roberta [name] is a pleasant 54 y.o. female, who is a Merchant navy officer at [employer],  with a hx of Lt breast cancer s/p mastectomy Aug 2019, pre DM, and HTN who presented 12/11/2019 with a NSTEMI.  At cath she had a small RI branch with thrombotic occlusion.  This was treated with POBA (too small for a stent).  There was residual 50% PDA narrowing.  Her Troponin peaked at 6812.  Echo showed normal LVF and grade 1 DD.  The patient presents today for post hospital follow up. After discharge she noted "low B/P" and was told to decrease her Lopressor to 12.5 mg daily.  She did not have syncope or pre syncope.  She is in the office today again noteing "low blood pressure".  She brought her home cuff with her and I reviewed the readings.  She apparently was concerned about the diastolic readings in the 19'E.  Her systolic reading ranged from 104-140. She is otherwise doing well, no chest pain, no unusual dyspnea.  She has been seen by the weight loss center as well.   Past Medical History:  Diagnosis Date  . Back pain   . Cancer [facility])    multifocal left breast  . Depression   . Hip pain   . Hypertension   . Obesity   . PONV (postoperative nausea and vomiting)   . Prediabetes   . Swelling    bilat LE  . Wears glasses     Past Surgical History:  Procedure Laterality Date  . CHOLECYSTECTOMY    . COLONOSCOPY    . CORONARY BALLOON ANGIOPLASTY N/A 12/11/2019   Procedure: CORONARY BALLOON ANGIOPLASTY;  Surgeon: [name], MD;  Location: [facility];  Service: Cardiovascular;  Laterality: N/A;  . DILATION AND CURETTAGE OF UTERUS    . GASTRIC BYPASS    . KNEE SURGERY  2002    Ligament repair  . LEFT HEART CATH AND CORONARY ANGIOGRAPHY N/A 12/11/2019   Procedure: LEFT HEART CATH AND CORONARY ANGIOGRAPHY;  Surgeon: [name], MD;  Location: [place];  Service: Cardiovascular;  Laterality: N/A;  . MASTECTOMY Left 2019  . MASTECTOMY W/ SENTINEL NODE BIOPSY Left 10/01/2018   Procedure: LEFT TOTAL MASTECTOMY WITH SENTINEL LYMPH NODE BIOPSY AND BLUE DYE INJECTION;  Surgeon: Fanny Skates, MD;  Location: [facility];  Service: General;  Laterality: Left;    Current Medications: Current Meds  Medication Sig  . aspirin EC 81 MG EC tablet Take 1 tablet (81 mg total) by mouth daily. Swallow whole.  Marland Kitchen atorvastatin (LIPITOR) 80 MG tablet Take 1 tablet (80 mg total) by mouth daily.  Marland Kitchen CINNAMON PO Take 2 capsules by mouth daily.  Marland Kitchen gabapentin (NEURONTIN) 300 MG capsule Take 1 capsule (300 mg total) by mouth at bedtime.  . hydrochlorothiazide (HYDRODIURIL) 12.5 MG tablet Take 1 tablet (12.5 mg total) by mouth daily.  . metFORMIN (GLUCOPHAGE) 500 MG tablet Take 1 tablet (500 mg total) by mouth every morning.  . metoprolol tartrate (LOPRESSOR) 25 MG tablet Take 0.5 tablets (12.5 mg total) by mouth 2 (two) times daily.  . Multiple Vitamin (MULTIVITAMIN)  tablet Take 1 tablet by mouth daily.  . mupirocin ointment (BACTROBAN) 2 % Apply 1 application topically 2 (two) times daily as needed. (Patient taking differently: Apply 1 application topically 2 (two) times daily as needed (dry patches on skin).)  . nitroGLYCERIN (NITROSTAT) 0.4 MG SL tablet Place 1 tablet (0.4 mg total) under the tongue every 5 (five) minutes x 3 doses as needed for chest pain.  . tamoxifen (NOLVADEX) 20 MG tablet Take 1 tablet (20 mg total) by mouth daily.  . ticagrelor (BRILINTA) 90 MG TABS tablet Take 1 tablet (90 mg total) by mouth 2 (two) times daily.  Marland Kitchen venlafaxine XR (EFFEXOR-XR) 75 MG 24 hr capsule Take 1 capsule (75 mg total) by mouth daily with breakfast.     Allergies:   Lisinopril,  Penicillins, Norvasc [amlodipine besylate], Red dye, and Tetanus toxoids   Social History   Socioeconomic History  . Marital status: Legally Separated    Spouse name: Not on file  . Number of children: Not on file  . Years of education: Not on file  . Highest education level: Not on file  Occupational History  . Not on file  Tobacco Use  . Smoking status: Never Smoker  . Smokeless tobacco: Never Used  Vaping Use  . Vaping Use: Never used  Substance and Sexual Activity  . Alcohol use: Yes    Comment: occasional wine  . Drug use: No  . Sexual activity: Not Currently  Other Topics Concern  . Not on file  Social History Narrative  . Not on file   Social Determinants of Health   Financial Resource Strain: Not on file  Food Insecurity: Not on file  Transportation Needs: Not on file  Physical Activity: Not on file  Stress: Not on file  Social Connections: Not on file     Family History: The patient's family history includes Cancer in her maternal aunt and maternal uncle; Diabetes in her mother; Heart disease in her mother; Hypertension in her mother; Kidney disease in her mother; Obesity in her mother; Stroke in her mother; Throat cancer in her maternal aunt.  ROS:   Please see the history of present illness.    All other systems reviewed and are negative.  EKGs/Labs/Other Studies Reviewed:    The following studies were reviewed today: Echo 12/12/2019- IMPRESSIONS    1. Left ventricular ejection fraction, by estimation, is 65 to 70%. The  left ventricle has normal function. The left ventricle has no regional  wall motion abnormalities. Left ventricular diastolic parameters are  consistent with Grade I diastolic  dysfunction (impaired relaxation).  2. Right ventricular systolic function is normal. The right ventricular  size is normal.  3. The mitral valve is normal in structure. No evidence of mitral valve  regurgitation. No evidence of mitral stenosis.  4.  The aortic valve was not well visualized. Aortic valve regurgitation  is not visualized. No aortic stenosis is present.  5. The inferior vena cava is normal in size with greater than 50%  respiratory variability, suggesting right atrial pressure of 3 mmHg.   EKG:  EKG is ordered today.  The ekg ordered today demonstrates NSR, HR 64  Recent Labs: 09/23/2019: ALT 13 12/12/2019: BUN 12; Creatinine, Ser 0.85; Hemoglobin 10.7; Platelets 147; Potassium 4.0; Sodium 138  Recent Lipid Panel    Component Value Date/Time   CHOL 116 12/12/2019 0055   CHOL 138 08/07/2019 1432   TRIG 83 12/12/2019 0055   HDL 57 12/12/2019 0055  HDL 69 08/07/2019 1432   CHOLHDL 2.0 12/12/2019 0055   VLDL 17 12/12/2019 0055   LDLCALC 42 12/12/2019 0055   LDLCALC 54 08/07/2019 1432    Physical Exam:    VS:  BP 132/86   Pulse 64   Ht 5\' 6"  (1.676 m)   Wt 255 lb (115.7 kg)   BMI 41.16 kg/m     Wt Readings from Last 3 Encounters:  12/23/19 255 lb (115.7 kg)  12/23/19 255 lb (115.7 kg)  12/12/19 259 lb 4.2 oz (117.6 kg)     GEN: Overweight female, well developed in no acute distress HEENT: Normal NECK: No JVD; No carotid bruits CARDIAC: RRR, no murmurs, rubs, gallops RESPIRATORY:  Clear to auscultation without rales, wheezing or rhonchi  ABDOMEN: Obese, soft, non-distended MUSCULOSKELETAL:  No edema; No deformity  SKIN: Warm and dry NEUROLOGIC:  Alert and oriented x 3 PSYCHIATRIC:  Normal affect   ASSESSMENT:    NSTEMI (non-ST elevated myocardial infarction) ([place]) 12/11/2019- HS Troponin- 7494  CAD S/P percutaneous coronary angioplasty Thrombotic occlusion of small RI- POBA (too small to stent) Residual 50% PDA Normal LVF  Essential hypertension Controlled- no change in Rx.  If she notices low systolic B/P- less than 95- I suggested she stop the HCTZ.   Class 3 severe obesity with serious comorbidity and body mass index (BMI) of 40.0 to 44.9 in adult [facility]) She had gastric bypass in  2005. BMI 41- she is now being followed by weight loss center  History of breast cancer Mastectomy Aug 2020  PLAN:    Same Rx- I'll ask Dr Stanford Breed about possibly lowering her Lipitor dose- in the hospital her LDL was 42, HDL 57.    Medication Adjustments/Labs and Tests Ordered: Current medicines are reviewed at length with the patient today.  Concerns regarding medicines are outlined above.  Orders Placed This Encounter  Procedures  . EKG 12-Lead   No orders of the defined types were placed in this encounter.   Patient Instructions  Medication Instructions:  Continue current medications  *If you need a refill on your cardiac medications before your next appointment, please call your pharmacy*   Lab Work: None Ordered   Testing/Procedures: None Ordered   Follow-Up: At [facility], you and your health needs are our priority.  As part of our continuing mission to provide you with exceptional heart care, we have created designated Provider Care Teams.  These Care Teams include your primary Cardiologist (physician) and Advanced Practice Providers (APPs -  Physician Assistants and Nurse Practitioners) who all work together to provide you with the care you need, when you need it.  We recommend signing up for the patient portal called "MyChart".  Sign up information is provided on this After Visit Summary.  MyChart is used to connect with patients for Virtual Visits (Telemedicine).  Patients are able to view lab/test results, encounter notes, upcoming appointments, etc.  Non-urgent messages can be sent to your provider as well.   To learn more about what you can do with MyChart, go to [email or web address].    Your next appointment:   3 month(s)  The format for your next appointment:   In Person  Provider:   You may see Kirk Ruths, MD or one of the following Advanced Practice Providers on your designated Care Team:    Kerin Ransom, PA-C  [place],  [place]  Coletta Memos, FNP      Signed, Kerin Ransom, [place]  12/23/2019 2:18 PM  [facility]

## 2019-12-23 NOTE — Assessment & Plan Note (Signed)
Controlled- no change in Rx.  If she notices low systolic B/P- less than 95- I suggested she stop the HCTZ.

## 2019-12-23 NOTE — Assessment & Plan Note (Signed)
Mastectomy Aug 2020

## 2019-12-23 NOTE — Progress Notes (Signed)
Chief Complaint:   OBESITY Roberta [name] is here to discuss her progress with her obesity treatment plan along with follow-up of her obesity related diagnoses. Roberta [name] is on the Category 3 Plan and states she is following her eating plan approximately 0% of the time. [name] states she is walking 30 minutes 7 times per week.  Today's visit was #: 75 Starting weight: 292 lbs Starting date: 10/08/2017 Today's weight: 255 lbs Today's date: 12/24/2019 Total lbs lost to date: 37 lbs Total lbs lost since last in-office visit: 6 lbs Total weight loss percentage to date: -12.67%  Interim History: Roberta [name] is newer to me, having only seen me one time last OV, but has seen mostly Dr U the 40+ visits prior.  Recently had an acute myocardial infarction and balloon angio since her last OV.  She was put on a heart healthy diet by cardiology.  She tells me that she is confused on what she should eat and not eat. She has a lot of questions about her meal plan today  Plan:  I advised Roberta [name] to follow the meal plan but avoid "unnecessary salt", thus decrease lunch meats, get "low sodium" meats, and make your own etc.  Also avoid canned veggies, frozen dinners etc.   In terms of heart healthiness, I explained all meats we recommend are 93% lean or better; we rec a very heart healthy diet- filled with veggies and fruits, low fat greek yogurt, fairlife milk, low calorie and healthier dairy options etc.  I explained she is on a way healthier meal plan now than 98%+ percent of the [place] diets out there and I recommend she follow it to lower her CV risk factors. I rec she lose wt to lower her risks DM etc etc.   Assessment/Plan:   1. Pre-diabetes [name] has a diagnosis of prediabetes based on her elevated HgA1c and was informed this puts her at greater risk of developing diabetes. She continues to work on diet and exercise to decrease her risk of diabetes. She denies nausea or hypoglycemia.  Lab Results  Component Value  Date   HGBA1C 5.9 (H) 12/12/2019   Lab Results  Component Value Date   INSULIN 7.2 08/07/2019   INSULIN 13.4 01/31/2018   INSULIN 10.6 10/08/2017   Plan: Refill Metformin for 1 month, as per below. Roberta [name] will continue to work on weight loss, exercise, and decreasing simple carbohydrates to help decrease the risk of diabetes.   Refill- metFORMIN (GLUCOPHAGE) 500 MG tablet; Take 1 tablet (500 mg total) by mouth every morning.  Dispense: 30 tablet; Refill: 0  2. CAD S/P percutaneous coronary angioplasty New. All hospital notes, recent labs, images, etc reviewed with [name]. She had a balloon angioplasty done, but no stent was placed due to small size of vessel. Shaima is on an anticoagulation medication.   Plan: I recommend Roberta [name] follow the treatment plan, per cardiology, and continue all cardiac related medication decisions, per cardiology. I also recommend cardiac rehab, if possible. Roberta [name] needs to follow heart healthy meal plan, decrease salt intake, and lose weight to improve health risks.  3. Essential hypertension with heart diagnosis Roberta [name] has no concerns. Review: taking medications as instructed, no medication side effects noted, no chest pain on exertion, no dyspnea on exertion, no swelling of ankles.   BP Readings from Last 3 Encounters:  12/23/19 132/86  12/23/19 109/72  12/12/19 (!) 162/78   Plan: Mariselda's blood pressure is at goal. Manage blood pressure medication, per cardiology team  due to recent circumstances. I recommend that Jamiya follow up with her PCP as well to review significant recent cardiovascular events.  4. At risk for diabetes mellitus - Roberta [name] was given extensive diabetes prevention education and counseling today of more than 30 minutes.  - Counseled patient on pathophysiology of disease and discussed various treatment options which always includes dietary and lifestyle modification as first line.   - Importance of healthy diet with very limited amounts of simple  carbohydrates discussed with patient in addition to regular aerobic exercise to an eventual goal of 89min 5d/week or more.  - Handouts provided at patient's desire and or told to go online at the American Diabetes Association website for further information  5. Class 3 severe obesity with serious comorbidity and body mass index (BMI) of 40.0 to 44.9 in adult, unspecified obesity type (HCC) Roberta [name] is currently in the action stage of change. As such, her goal is to continue with weight loss efforts. She has agreed to the Category 3 Plan with breakfast options.   Exercise goals: 300 minutes of cardio a week if possible. Consider starting cardiac rehab, per cardiology recommendations.  Behavioral modification strategies: keeping healthy foods in the home, holiday eating strategies , celebration eating strategies, avoiding temptations and planning for success.  Desire has agreed to follow-up with our clinic in 2-2.5 with Dr. Jearld [name] weeks. She was informed of the importance of frequent follow-up visits to maximize her success with intensive lifestyle modifications for her multiple health conditions.   Objective:   Blood pressure 109/72, pulse 81, temperature 98.1 F (36.7 C), height 5\' 6"  (1.676 m), weight 255 lb (115.7 kg), SpO2 99 %. Body mass index is 41.16 kg/m.  General: Cooperative, alert, well developed, in no acute distress. HEENT: Conjunctivae and lids unremarkable. Cardiovascular: Regular rhythm.  Lungs: Normal work of breathing. Neurologic: No focal deficits.   Lab Results  Component Value Date   CREATININE 0.85 12/12/2019   BUN 12 12/12/2019   NA 138 12/12/2019   K 4.0 12/12/2019   CL 105 12/12/2019   CO2 24 12/12/2019   Lab Results  Component Value Date   ALT 13 09/23/2019   AST 21 09/23/2019   ALKPHOS 59 09/23/2019   BILITOT 0.3 09/23/2019   Lab Results  Component Value Date   HGBA1C 5.9 (H) 12/12/2019   HGBA1C 6.0 (H) 12/11/2019   HGBA1C 5.9 (H) 08/07/2019    HGBA1C 6.4 (H) 01/27/2019   HGBA1C 6.0 (H) 09/25/2018   Lab Results  Component Value Date   INSULIN 7.2 08/07/2019   INSULIN 13.4 01/31/2018   INSULIN 10.6 10/08/2017   Lab Results  Component Value Date   TSH 1.480 10/08/2017   Lab Results  Component Value Date   CHOL 116 12/12/2019   HDL 57 12/12/2019   LDLCALC 42 12/12/2019   TRIG 83 12/12/2019   CHOLHDL 2.0 12/12/2019   Lab Results  Component Value Date   WBC 3.4 (L) 12/12/2019   HGB 10.7 (L) 12/12/2019   HCT 33.3 (L) 12/12/2019   MCV 80.8 12/12/2019   PLT 147 (L) 12/12/2019   Lab Results  Component Value Date   IRON 162 (H) 01/22/2017   FERRITIN 14.5 01/22/2017    Attestation Statements:   Reviewed by clinician on day of visit: allergies, medications, problem list, medical history, surgical history, family history, social history, and previous encounter notes.   Coral Ceo, am acting as Location manager for [employer], DO.  I have reviewed the above documentation for  accuracy and completeness, and I agree with the above. Marjory Sneddon, D.O.  The [place] was signed into law in 2016 which includes the topic of electronic health records.  This provides immediate access to information in MyChart.  This includes consultation notes, operative notes, office notes, lab results and pathology reports.  If you have any questions about what you read please let us know at your next visit so we can discuss your concerns and take corrective action if need be.  We are right here with you.

## 2019-12-23 NOTE — Assessment & Plan Note (Signed)
12/11/2019- HS Troponin- 4033

## 2019-12-23 NOTE — Patient Instructions (Signed)
Medication Instructions:  Continue current medications  *If you need a refill on your cardiac medications before your next appointment, please call your pharmacy*   Lab Work: None Ordered   Testing/Procedures: None Ordered   Follow-Up: At [facility], you and your health needs are our priority.  As part of our continuing mission to provide you with exceptional heart care, we have created designated Provider Care Teams.  These Care Teams include your primary Cardiologist (physician) and Advanced Practice Providers (APPs -  Physician Assistants and Nurse Practitioners) who all work together to provide you with the care you need, when you need it.  We recommend signing up for the patient portal called "MyChart".  Sign up information is provided on this After Visit Summary.  MyChart is used to connect with patients for Virtual Visits (Telemedicine).  Patients are able to view lab/test results, encounter notes, upcoming appointments, etc.  Non-urgent messages can be sent to your provider as well.   To learn more about what you can do with MyChart, go to [email or web address].    Your next appointment:   3 month(s)  The format for your next appointment:   In Person  Provider:   You may see Brian Crenshaw, MD or one of the following Advanced Practice Providers on your designated Care Team:    Luke Kilroy, PA-C  Callie Goodrich, PA-C  Jesse Cleaver, FNP     

## 2019-12-30 ENCOUNTER — Telehealth: Payer: Self-pay | Admitting: Cardiology

## 2019-12-30 DIAGNOSIS — T148XXA Other injury of unspecified body region, initial encounter: Secondary | ICD-10-CM

## 2019-12-30 NOTE — Telephone Encounter (Signed)
Pt calling to report recent bruising on Brilinta. She states she has noticed several areas of bruising that have developed over the last week or so. She states these are spontaneous and denies injury or bumping into anything. She has noticed 2 on her right thigh and 1 on her left shin, each about the size of a quarter. She has also noticed 2 additional bruises the size of golf balls on her abdomen. She states these areas are discolored, but denies any swelling. Denies bleeding gums or epistaxis. Does report that significant bruising on her wrist from her cardiac catheterization has begun to heal. Advised patient to continue to monitor. We will make Dr. Jens Som aware and let pt know of any recommendations.

## 2019-12-30 NOTE — Addendum Note (Signed)
Addended by: Lebron Conners, Maralyn Sago E on: 12/30/2019 10:22 AM   Modules accepted: Orders

## 2019-12-30 NOTE — Telephone Encounter (Signed)
Check cbc; make sure pt has fuov Roberta [name]

## 2019-12-30 NOTE — Telephone Encounter (Signed)
Per Dr. Jens Som, orders placed for CBC. FU visit confirmed. Spoke with pt again to relay recommendations. She states she will come by around noon today for blood draw.

## 2019-12-30 NOTE — Telephone Encounter (Signed)
° ° ° °  Pt c/o medication issue:  1. Name of Medication:   ticagrelor (BRILINTA) 90 MG TABS tablet    2. How are you currently taking this medication (dosage and times per day)? Take 1 tablet (90 mg total) by mouth 2 (two) times daily.  3. Are you having a reaction (difficulty breathing--STAT)?   4. What is your medication issue? Pt said she is bruising, she wanted to speak with a nurse for recommendations

## 2019-12-31 ENCOUNTER — Encounter: Payer: Self-pay | Admitting: *Deleted

## 2019-12-31 LAB — CBC
Hematocrit: 37.3 % (ref 34.0–46.6)
Hemoglobin: 12 g/dL (ref 11.1–15.9)
MCH: 26 pg — ABNORMAL LOW (ref 26.6–33.0)
MCHC: 32.2 g/dL (ref 31.5–35.7)
MCV: 81 fL (ref 79–97)
Platelets: 247 10*3/uL (ref 150–450)
RBC: 4.61 x10E6/uL (ref 3.77–5.28)
RDW: 15.5 % — ABNORMAL HIGH (ref 11.7–15.4)
WBC: 4.9 10*3/uL (ref 3.4–10.8)

## 2020-01-01 ENCOUNTER — Telehealth (HOSPITAL_COMMUNITY): Payer: Self-pay | Admitting: Pharmacist

## 2020-01-01 NOTE — Telephone Encounter (Signed)
Pharmacy Transitions of Care Follow-up Telephone Call  Date of discharge: 12/12/19 Discharge Diagnosis: NSTEMI  How have you been since you were released from the hospital? Doing well  Medication changes made at discharge:  ASA 81mg  Atorvastatin NTG Brilinta  Medication changes obtained and verified? Y    Medication Accessibility:  Home Pharmacy: [facility]  Was the patient provided with refills on discharged medications? Yes Have all prescriptions been transferred from [facility] to home pharmacy? Yes  Is the patient able to afford medications? Yes . Notable copays: $0.00 . Eligible patient assistance: N/A    Medication Review:   TICAGRELOR (BRILINTA) Ticagrelor 90 mg BID initiated on 12/12/19  - Educated patient on expected duration of therapy of aspirin 81mg  with ticagrelor. Per MD's note, she will be on ASA and Brilinta for 12 months and was confirmed by patient. - Discussed importance of taking medication around the same time every day, - Reviewed potential DDIs with patient - Advised patient of medications to avoid (NSAIDs, aspirin maintenance doses>100 mg daily) - Educated that Tylenol (acetaminophen) will be the preferred analgesic to prevent risk of bleeding  - Emphasized importance of monitoring for signs and symptoms of bleeding (abnormal bruising, prolonged bleeding, nose bleeds, bleeding from gums, discolored urine, black tarry stools)  - Educated patient to notify doctor if shortness of breath or abnormal heartbeat occur - Advised patient to alert all providers of antiplatelet therapy prior to starting a new medication or having a procedure   Follow-up Appointments:  PCP Hospital f/u appt confirmed? Pt saw 14/10/21 on 12/23/19 and another visit in 6 months.   If their condition worsens, is the pt aware to call PCP or go to the Emergency Dept.? Y  Final Patient Assessment: Pt is doing well. She reported some bruises and that MD is aware  of them. Advised her to use pen to trace the bruises to check if they are getting smaller or bigger and to inform MD if they are getting worse in number and size.  Corine Shelter, PharmD

## 2020-01-06 ENCOUNTER — Telehealth: Payer: Self-pay | Admitting: Cardiology

## 2020-01-06 NOTE — Telephone Encounter (Signed)
New Message:      Pt said she came in for lab work last week. She wanted to know the results please, wanted to know what was causing her to bruise.

## 2020-01-07 NOTE — Telephone Encounter (Signed)
Left message for pt, aware of lab results and that a letter of results was mailed to her home

## 2020-01-08 ENCOUNTER — Other Ambulatory Visit: Payer: Self-pay

## 2020-01-08 ENCOUNTER — Encounter (INDEPENDENT_AMBULATORY_CARE_PROVIDER_SITE_OTHER): Payer: Self-pay | Admitting: Family Medicine

## 2020-01-08 ENCOUNTER — Other Ambulatory Visit (INDEPENDENT_AMBULATORY_CARE_PROVIDER_SITE_OTHER): Payer: Self-pay | Admitting: Family Medicine

## 2020-01-08 ENCOUNTER — Ambulatory Visit (INDEPENDENT_AMBULATORY_CARE_PROVIDER_SITE_OTHER): Payer: BC Managed Care – PPO | Admitting: Family Medicine

## 2020-01-08 VITALS — BP 126/85 | HR 70 | Temp 98.2°F | Ht 66.0 in | Wt 260.0 lb

## 2020-01-08 DIAGNOSIS — Z6841 Body Mass Index (BMI) 40.0 and over, adult: Secondary | ICD-10-CM

## 2020-01-08 DIAGNOSIS — R7303 Prediabetes: Secondary | ICD-10-CM

## 2020-01-08 DIAGNOSIS — Z9189 Other specified personal risk factors, not elsewhere classified: Secondary | ICD-10-CM

## 2020-01-08 DIAGNOSIS — F32A Depression, unspecified: Secondary | ICD-10-CM

## 2020-01-08 DIAGNOSIS — I10 Essential (primary) hypertension: Secondary | ICD-10-CM

## 2020-01-08 DIAGNOSIS — F419 Anxiety disorder, unspecified: Secondary | ICD-10-CM | POA: Diagnosis not present

## 2020-01-08 MED ORDER — METFORMIN HCL 500 MG PO TABS
500.0000 mg | ORAL_TABLET | Freq: Every morning | ORAL | 0 refills | Status: DC
Start: 2020-01-08 — End: 2020-03-10

## 2020-01-10 ENCOUNTER — Emergency Department (HOSPITAL_COMMUNITY)
Admission: EM | Admit: 2020-01-10 | Discharge: 2020-01-10 | Disposition: A | Discharge status: Home / Self Care | Payer: BC Managed Care – PPO | Attending: Emergency Medicine | Admitting: Emergency Medicine

## 2020-01-10 ENCOUNTER — Other Ambulatory Visit: Payer: Self-pay

## 2020-01-10 ENCOUNTER — Encounter (HOSPITAL_COMMUNITY): Payer: Self-pay | Admitting: Emergency Medicine

## 2020-01-10 DIAGNOSIS — R040 Epistaxis: Secondary | ICD-10-CM | POA: Diagnosis present

## 2020-01-10 DIAGNOSIS — Z853 Personal history of malignant neoplasm of breast: Secondary | ICD-10-CM | POA: Diagnosis not present

## 2020-01-10 DIAGNOSIS — Z7984 Long term (current) use of oral hypoglycemic drugs: Secondary | ICD-10-CM | POA: Insufficient documentation

## 2020-01-10 DIAGNOSIS — R7303 Prediabetes: Secondary | ICD-10-CM | POA: Insufficient documentation

## 2020-01-10 DIAGNOSIS — Z7982 Long term (current) use of aspirin: Secondary | ICD-10-CM | POA: Insufficient documentation

## 2020-01-10 DIAGNOSIS — I10 Essential (primary) hypertension: Secondary | ICD-10-CM | POA: Diagnosis not present

## 2020-01-10 DIAGNOSIS — Z79899 Other long term (current) drug therapy: Secondary | ICD-10-CM | POA: Insufficient documentation

## 2020-01-10 NOTE — ED Provider Notes (Signed)
[place] EMERGENCY DEPARTMENT Provider Note   CSN: [id number] Arrival date & time: 01/10/20  1342     History Chief Complaint  Patient presents with  . Epistaxis    Roberta [name] is a 55 y.o. female.  55 year old female brought in by EMS from home for nosebleed today.  Patient states that she is on Brilinta post MI 1 month ago, was sitting at home today watching TV when she suddenly developed a nosebleed out of her right nare.  Patient tried putting a cool rag to her neck and across her face but after 15 minutes of continuous bleeding called EMS.  EMS tried Afrin yet patient continues to bleed.  Patient denies headache, feeling lightheaded, weak, dizzy.  No other complaints or concerns.        Past Medical History:  Diagnosis Date  . Back pain   . [facility])    multifocal left breast  . Depression   . Hip pain   . Hypertension   . Obesity   . PONV (postoperative nausea and vomiting)   . Prediabetes   . Swelling    bilat LE  . Wears glasses     Patient Active Problem List   Diagnosis Date Noted  . History of breast cancer 12/23/2019  . At risk for diabetes mellitus 12/23/2019  . CAD S/P percutaneous coronary angioplasty 12/12/2019  . NSTEMI (non-ST elevated myocardial infarction) ([place]) 12/11/2019  . Essential hypertension 12/04/2019  . S/P gastric bypass 12/04/2019  . At risk for deficient intake of food 12/04/2019  . Chest pressure 08/22/2019  . Premenstrual dysphoric disorder 12/22/2018  . Anxiety and depression 12/10/2018  . Morbid obesity with BMI of 40.0-44.9, adult ([place]) 09/04/2018  . Malignant neoplasm of upper-inner quadrant of left breast in female, estrogen receptor positive ([place]) 09/02/2018  . Vitamin D deficiency, taking vit D 5000 IU daily 02/04/2018  . Urticaria 01/17/2018  . Weight gain following gastric bypass surgery 06/25/2017  . 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 Left hip pain 02/10/2017  . Microcytic anemia,  mild 01/21/2017  . Malabsorption, due to gastic bypass 2005 12/13/2015  . Prediabetes 12/13/2015  . Class 3 severe obesity with serious comorbidity and body mass index (BMI) of 40.0 to 44.9 in adult ([place]) 12/13/2015  . Globus sensation 12/13/2015  . Leg edema, L > R 12/13/2015  . Dense breasts 12/13/2011  . 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 Hypertension, Rx Lisinopril-HCTZ 11/10/2011    Past Surgical History:  Procedure Laterality Date  . CHOLECYSTECTOMY    . COLONOSCOPY    . CORONARY BALLOON ANGIOPLASTY N/A 12/11/2019   Procedure: CORONARY BALLOON ANGIOPLASTY;  Surgeon: [name], MD;  Location: [facility] CV LAB;  Service: Cardiovascular;  Laterality: N/A;  . DILATION AND CURETTAGE OF UTERUS    . GASTRIC BYPASS    . KNEE SURGERY  2002   Ligament repair  . LEFT HEART CATH AND CORONARY ANGIOGRAPHY N/A 12/11/2019   Procedure: LEFT HEART CATH AND CORONARY ANGIOGRAPHY;  Surgeon: [name], MD;  Location: [place] [facility];  Service: Cardiovascular;  Laterality: N/A;  . MASTECTOMY Left 2019  . MASTECTOMY W/ SENTINEL NODE BIOPSY Left 10/01/2018   Procedure: LEFT TOTAL MASTECTOMY WITH SENTINEL LYMPH NODE BIOPSY AND BLUE DYE INJECTION;  Surgeon: Fanny Skates, MD;  Location: [facility];  Service: General;  Laterality: Left;     OB History  Gravida  0   Para  0   Term  0   Preterm  0   AB  0   Living  0     SAB  0   IAB  0   Ectopic  0   Multiple  0   Live Births  0           Family History  Problem Relation Age of Onset  . Diabetes Mother   . Hypertension Mother   . Heart disease Mother   . Stroke Mother   . Kidney disease Mother   . Obesity Mother   . Cancer Maternal Aunt        lung  . Cancer Maternal Uncle        lung  . Throat cancer Maternal Aunt     Social History   Tobacco Use  . Smoking status: Never Smoker  . Smokeless tobacco: Never Used  Vaping  Use  . Vaping Use: Never used  Substance Use Topics  . Alcohol use: Yes    Comment: occasional wine  . Drug use: No    Home Medications Prior to Admission medications   Medication Sig Start Date End Date Taking? Authorizing Provider  aspirin EC 81 MG EC tablet Take 1 tablet (81 mg total) by mouth daily. Swallow whole. 12/13/19   Kroeger, Lorelee Cover., PA-C  atorvastatin (LIPITOR) 80 MG tablet Take 1 tablet (80 mg total) by mouth daily. 12/13/19   Kroeger, Lorelee Cover., PA-C  CINNAMON PO Take 2 capsules by mouth daily.    [provider]  gabapentin (NEURONTIN) 300 MG capsule Take 1 capsule (300 mg total) by mouth at bedtime. 09/23/19   Magrinat, Virgie Dad, MD  hydrochlorothiazide (HYDRODIURIL) 12.5 MG tablet Take 1 tablet (12.5 mg total) by mouth daily. 12/04/19   Opalski, Neoma Laming, DO  metFORMIN (GLUCOPHAGE) 500 MG tablet Take 1 tablet (500 mg total) by mouth every morning. 01/08/20   Laqueta Linden, MD  metoprolol tartrate (LOPRESSOR) 25 MG tablet Take 0.5 tablets (12.5 mg total) by mouth 2 (two) times daily. 12/17/19   Lelon Perla, MD  Multiple Vitamin (MULTIVITAMIN) tablet Take 1 tablet by mouth daily.    [provider]  mupirocin ointment (BACTROBAN) 2 % Apply 1 application topically 2 (two) times daily as needed. Patient taking differently: Apply 1 application topically 2 (two) times daily as needed (dry patches on skin). 01/09/19   Briscoe Deutscher, DO  nitroGLYCERIN (NITROSTAT) 0.4 MG SL tablet Place 1 tablet (0.4 mg total) under the tongue every 5 (five) minutes x 3 doses as needed for chest pain. 12/12/19   Kroeger, Lorelee Cover., PA-C  tamoxifen (NOLVADEX) 20 MG tablet Take 1 tablet (20 mg total) by mouth daily. 09/23/19   Magrinat, Virgie Dad, MD  ticagrelor (BRILINTA) 90 MG TABS tablet Take 1 tablet (90 mg total) by mouth 2 (two) times daily. 12/12/19   Kroeger, Lorelee Cover., PA-C  venlafaxine XR (EFFEXOR-XR) 75 MG 24 hr capsule Take 1 capsule (75 mg total) by mouth daily  with breakfast. 09/23/19   Magrinat, Virgie Dad, MD  sertraline (ZOLOFT) 50 MG tablet Take 1 tablet (50 mg total) by mouth daily. 07/17/19 08/22/19  Laqueta Linden, MD    Allergies    Lisinopril, Penicillins, Norvasc [amlodipine besylate], Red dye, and Tetanus toxoids  Review of Systems   Review of Systems  Constitutional: Negative for fever.  HENT: Positive for nosebleeds.   Respiratory: Negative for shortness of breath.  Cardiovascular: Negative for chest pain.  Gastrointestinal: Negative for vomiting.  Neurological: Negative for dizziness, weakness, light-headedness and headaches.  Hematological: Bruises/bleeds easily.  All other systems reviewed and are negative.   Physical Exam Updated Vital Signs BP (!) 142/80 (BP Location: Right Arm)   Pulse 71   Temp 98.3 F (36.8 C) (Oral)   Resp 18   SpO2 97%   Physical Exam Vitals and nursing note reviewed.  Constitutional:      General: She is not in acute distress.    Appearance: She is well-developed and well-nourished. She is not diaphoretic.  HENT:     Head: Normocephalic and atraumatic.     Comments: Light bleeding out right nare    Mouth/Throat:     Mouth: Mucous membranes are moist.  Eyes:     Pupils: Pupils are equal, round, and reactive to light.  Cardiovascular:     Pulses: Normal pulses.  Pulmonary:     Effort: Pulmonary effort is normal.  Musculoskeletal:     Cervical back: Neck supple.  Skin:    General: Skin is warm and dry.     Findings: No erythema or rash.  Neurological:     Mental Status: She is alert and oriented to person, place, and time.  Psychiatric:        Mood and Affect: Mood and affect normal.        Behavior: Behavior normal.     ED Results / Procedures / Treatments   Labs (all labs ordered are listed, but only abnormal results are displayed) Labs Reviewed - No data to display  EKG None  Radiology No results found.  Procedures Procedures (including critical care  time)  Medications Ordered in ED Medications - No data to display  ED Course  I have reviewed the triage vital signs and the nursing notes.  Pertinent labs & imaging results that were available during my care of the patient were reviewed by me and considered in my medical decision making (see chart for details).  Clinical Course as of 01/10/20 1603  Sat Jan 08, 383  [id number] 55 year old female brought in by EMS for epistasis from right nare.  On exam, has light bleeding from right nare.  Consistent pressure was held for 15 minutes, bleeding has since resolved.  Plan is to monitor patient, proceed with further treatment as needed or consider discharge if bleeding remains controlled. [LM]  1603 Bleeding remains controlled.  Discussed management at home, given referral to ENT if needed. [LM]    Clinical Course User Index [LM] Roque Lias   MDM Rules/Calculators/A&P                          Final Clinical Impression(s) / ED Diagnoses Final diagnoses:  Right-sided epistaxis    Rx / DC Orders ED Discharge Orders    None       Tacy Learn, PA-C 01/10/20 1604    Tegeler, Gwenyth Allegra, MD 01/10/20 1949

## 2020-01-10 NOTE — Discharge Instructions (Addendum)
Do NOT blow your nose!  If bleeding returns: Roberta [name] out all of the clots Spray Afrin heavily Pinch the soft part of your nose shut and hold constant pressure for 15 to 20 minutes. If bleeding continues, return to the emergency room. If you continue to struggle with managed nosebleeds at home, follow-up with ear nose and throat, call on Monday to schedule an appointment.

## 2020-01-10 NOTE — ED Triage Notes (Signed)
Pt to triage via [facility] from home.  Reports R nostril bleeding since 12:30pm.  EMS administered a few sprays of Afrin and pt has Afrin soaked guaze on nose at this time.  Still actively bleeding.  Pt takes Brilinta.

## 2020-01-12 MED ORDER — CLOPIDOGREL BISULFATE 75 MG PO TABS: 75.0000 mg | ORAL_TABLET | Freq: Every day | ORAL | 3 refills | Status: DC

## 2020-01-12 NOTE — Telephone Encounter (Signed)
DC brilinta and treat with plavix 75 mg daily. Brian Crenshaw  

## 2020-01-12 NOTE — [place] Notes (Signed)
Chief Complaint:   OBESITY Roberta [name] is here to discuss her progress with her obesity treatment plan along with follow-up of her obesity related diagnoses. Roberta [name] is on the Category 3 Plan and states she is following her eating plan approximately 90% of the time. Roberta [name] states she is not exercising at this time.   Today's visit was #: 42 Starting weight: 292 lbs Starting date: 10/08/2017 Today's weight: 260 lBs Today's date: 01/08/2020 Total lbs lost to date: 32 lbs Total lbs lost since last in-office visit: 0  Interim History: Roberta [name] had NSTEMI on 12/11/2019, required no drug stent just balloon angiography. She wants to go back to the gym as she feels ready to start exercising. She is worried about deli meat.  Subjective:   1. Prediabetes [name] has a diagnosis of prediabetes based on her elevated HgA1c and was informed this puts her at greater risk of developing diabetes. She continues to work on diet and exercise to decrease her risk of diabetes. She denies nausea or hypoglycemia. LAST A1c was 5.9 and insulin 7.2. Roberta [name] is on Metformin.  Lab Results  Component Value Date   HGBA1C 5.9 (H) 12/12/2019   Lab Results  Component Value Date   INSULIN 7.2 08/07/2019   INSULIN 13.4 01/31/2018   INSULIN 10.6 10/08/2017    2. Anxiety and depression Roberta [name] denies suicidal or homicidal ideas. She is on Zoloft and symptoms are somewhat controlled.  3. At risk for diabetes mellitus Roberta [name] is at higher than average risk for developing diabetes due to obesity.      Assessment/Plan:   1. Prediabetes Roberta [name] will continue to work on weight loss, exercise, and decreasing simple carbohydrates to help decrease the risk of diabetes. We will refill Metformin 500 mg po daily #30 with no refills.  - metFORMIN (GLUCOPHAGE) 500 MG tablet; Take 1 tablet (500 mg total) by mouth every morning.  Dispense: 30 tablet; Refill: 0  2. Anxiety and depression Roberta [name] will continue Zoloft. No refill needed.  3.  At risk for diabetes mellitus Roberta [name] was given approximately 15 minutes of diabetes education and counseling today. We discussed intensive lifestyle modifications today with an emphasis on weight loss as well as increasing exercise and decreasing simple carbohydrates in her diet. We also reviewed medication options with an emphasis on risk versus benefit of those discussed.   Repetitive spaced learning was employed today to elicit superior memory formation and behavioral change.  4. Class 3 severe obesity with serious comorbidity and body mass index (BMI) of 40.0 to 44.9 in adult, unspecified obesity type (HCC)  Roberta [name] is currently in the action stage of change. As such, her goal is to continue with weight loss efforts. She has agreed to the Category 3 Plan and Journaling 1450-1600 calories, and 95+grams of protein daily.  Exercise goals: No exercise has been prescribed at this time.  Behavioral modification strategies: increasing lean protein intake, meal planning and cooking strategies and keeping healthy foods in the home.  Roberta [name] has agreed to follow-up with our clinic in 2 weeks. She was informed of the importance of frequent follow-up visits to maximize her success with intensive lifestyle modifications for her multiple health conditions.    Objective:   Blood pressure 126/85, pulse 70, temperature 98.2 F (36.8 C), temperature source Oral, height 5\' 6"  (1.676 m), weight 260 lb (117.9 kg), SpO2 100 %. Body mass index is 41.97 kg/m.  General: Cooperative, alert, well developed, in no acute distress. HEENT: Conjunctivae and lids unremarkable. Cardiovascular:  Regular rhythm.  Lungs: Normal work of breathing. Neurologic: No focal deficits.   Lab Results  Component Value Date   CREATININE 0.85 12/12/2019   BUN 12 12/12/2019   NA 138 12/12/2019   K 4.0 12/12/2019   CL 105 12/12/2019   CO2 24 12/12/2019   Lab Results  Component Value Date   ALT 13 09/23/2019   AST 21 09/23/2019    ALKPHOS 59 09/23/2019   BILITOT 0.3 09/23/2019   Lab Results  Component Value Date   HGBA1C 5.9 (H) 12/12/2019   HGBA1C 6.0 (H) 12/11/2019   HGBA1C 5.9 (H) 08/07/2019   HGBA1C 6.4 (H) 01/27/2019   HGBA1C 6.0 (H) 09/25/2018   Lab Results  Component Value Date   INSULIN 7.2 08/07/2019   INSULIN 13.4 01/31/2018   INSULIN 10.6 10/08/2017   Lab Results  Component Value Date   TSH 1.480 10/08/2017   Lab Results  Component Value Date   CHOL 116 12/12/2019   HDL 57 12/12/2019   LDLCALC 42 12/12/2019   TRIG 83 12/12/2019   CHOLHDL 2.0 12/12/2019   Lab Results  Component Value Date   WBC 4.9 12/30/2019   HGB 12.0 12/30/2019   HCT 37.3 12/30/2019   MCV 81 12/30/2019   PLT 247 12/30/2019   Lab Results  Component Value Date   IRON 162 (H) 01/22/2017   FERRITIN 14.5 01/22/2017     I, Para March, am acting as Location manager for Coralie Common, MD.  I have reviewed the above documentation for accuracy and completeness, and I agree with the above. - Jinny Blossom, MD

## 2020-01-12 NOTE — Telephone Encounter (Signed)
This patient was last seen by Dr. Jearld Shines, and currently has an upcoming appt scheduled on 01/20/20 with her.

## 2020-01-13 ENCOUNTER — Encounter (INDEPENDENT_AMBULATORY_CARE_PROVIDER_SITE_OTHER): Payer: Self-pay

## 2020-01-13 NOTE — Telephone Encounter (Signed)
Message sent to pt-CAS 

## 2020-01-15 ENCOUNTER — Telehealth: Payer: Self-pay | Admitting: Cardiology

## 2020-01-15 NOTE — Telephone Encounter (Signed)
Spoke with pt who state she is noticing her BP is gradually getting lower. Pt state overall she has felt well but today woke up feeling very fatigue and weak. She denies any other symptoms. BP listed below.   102/68 today 107/71 119/70 118/70  Will forward to MD for recommendations.

## 2020-01-15 NOTE — Telephone Encounter (Signed)
Pt c/o BP issue: STAT if pt c/o blurred vision, one-sided weakness or slurred speech  1. What are your last 5 BP readings?  102/68 She states she keeps a log, but she does not currently have access to it.  2. Are you having any other symptoms (ex. Dizziness, headache, blurred vision, passed out)? Weakness, fatigue  3. What is your BP issue? BP is low, causing weakness and fatigue.

## 2020-01-15 NOTE — Telephone Encounter (Signed)
Spoke with pt, Aware of dr crenshaw's recommendations.  °

## 2020-01-15 NOTE — Telephone Encounter (Signed)
DC HCTZ and follow BP Kirk Ruths

## 2020-01-17 ENCOUNTER — Encounter (INDEPENDENT_AMBULATORY_CARE_PROVIDER_SITE_OTHER): Payer: Self-pay

## 2020-01-19 ENCOUNTER — Ambulatory Visit: Payer: BC Managed Care – PPO | Admitting: Allergy

## 2020-01-20 ENCOUNTER — Telehealth (INDEPENDENT_AMBULATORY_CARE_PROVIDER_SITE_OTHER): Payer: BC Managed Care – PPO | Admitting: Family Medicine

## 2020-01-30 ENCOUNTER — Telehealth: Payer: Self-pay | Admitting: Cardiology

## 2020-01-30 NOTE — Telephone Encounter (Signed)
Pt c/o of Chest Pain: STAT if CP now or developed within 24 hours  1. Are you having CP right now? No  2. Are you experiencing any other symptoms (ex. SOB, nausea, vomiting, sweating)? No  3. How long have you been experiencing CP? Last night  4. Is your CP continuous or coming and going? Coming and going  5. Have you taken Nitroglycerin? Yes  ?

## 2020-01-30 NOTE — Telephone Encounter (Signed)
Spoke with pt who report chest tightness and back pain that started last night while doing school work. Pt report she went to sleep but woke up this morning with same symptoms. Pt took 1 nitro and denies symptoms currently, but report it was similar to previous MI episode.   Nurse scheduled pt for first available on 2/4 with Sheela Stack, NP,  But emphasized the importance of reporting to ER if symptoms reoccur. Pt verbalized understanding.

## 2020-02-04 NOTE — Progress Notes (Signed)
Cardiology Clinic Note   Patient Name: Roberta [name] Date of Encounter: 02/06/2020  Primary Care Provider:  [name], MD Primary Cardiologist:  Kirk Ruths, MD  Patient Profile    Roberta [name] 55 year old female presents the clinic today for follow-up evaluation of her coronary artery disease status post NSTEMI and chest pain.  Past Medical History    Past Medical History:  Diagnosis Date  . Back pain   . Cancer [facility])    multifocal left breast  . Depression   . Hip pain   . Hypertension   . Obesity   . PONV (postoperative nausea and vomiting)   . Prediabetes   . Swelling    bilat LE  . Wears glasses    Past Surgical History:  Procedure Laterality Date  . CHOLECYSTECTOMY    . COLONOSCOPY    . CORONARY BALLOON ANGIOPLASTY N/A 12/11/2019   Procedure: CORONARY BALLOON ANGIOPLASTY;  Surgeon: [name], MD;  Location: [place] [facility];  Service: Cardiovascular;  Laterality: N/A;  . DILATION AND CURETTAGE OF UTERUS    . GASTRIC BYPASS    . KNEE SURGERY  2002   Ligament repair  . LEFT HEART CATH AND CORONARY ANGIOGRAPHY N/A 12/11/2019   Procedure: LEFT HEART CATH AND CORONARY ANGIOGRAPHY;  Surgeon: [name], MD;  Location: [place] [facility];  Service: Cardiovascular;  Laterality: N/A;  . MASTECTOMY Left 2019  . MASTECTOMY W/ SENTINEL NODE BIOPSY Left 10/01/2018   Procedure: LEFT TOTAL MASTECTOMY WITH SENTINEL LYMPH NODE BIOPSY AND BLUE DYE INJECTION;  Surgeon: Fanny Skates, MD;  Location: [place];  Service: General;  Laterality: Left;    Allergies  Allergies  Allergen Reactions  . Lisinopril Swelling    Swelling of the tongue  . Penicillins     UNSPECIFIED CHILDHOOD REACTION  . Norvasc [Amlodipine Besylate] Rash  . Red Dye Rash  . Tetanus Toxoids Swelling    Swelling at site of injection    History of Present Illness    Roberta [name] has a PMH of left breast cancer status postmastectomy 8/19, prediabetes, hypertension and STEMI  12/11/2019.  She underwent cardiac catheterization which showed small RI branch with a rhombic occlusion.  She was treated with p.o. PA (too small to stent).  There was residual 50% PDA narrowing.  Her troponins peaked at 6812.  Her echocardiogram showed normal LV function with grade 1 diastolic dysfunction.  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  She presents to the clinic 12/23/2019 and saw Kerin Ransom, PA-C.  She indicated that since she had been discharged from the hospital her blood pressure was low.  She was told to decrease her Lopressor to 12.5 mg daily.  She denied any episodes of syncope or presyncope.  On presentation she again noted that her blood pressure was low.  She did bring her blood pressure cuff and her blood pressure readings were reviewed.  She was concerned about diastolic blood pressure readings in the 50s.  Her systolic readings ranged from 104-140.  She was otherwise doing well.  She denied chest pain and unusual dyspnea.  She was also being seen by the weight loss center.  She contacted nurse triage line on 01/15/2020 and reported that she was having low blood pressures.  Her hydrochlorothiazide was discontinued at that time.  She contacted nurse triage line on 01/30/2020 and reported that she had had intermittent episodes of chest pain.  She had taken nitroglycerin.  She presents the clinic  today for follow-up evaluation states she has not had any further episodes of chest pain.  She reports that she had a stressful class with some unruly students and feels that the stress is the main contributor to her chest pain.  She did take 1 nitroglycerin which relieved her pain.  We reviewed her medications and Imdur.  I will start her on 15 mg daily, give her the salty 6 diet sheet, have her increase her physical activity as tolerated, have her maintain blood pressure log, and follow-up in 1 month.  Today she denies chest pain, shortness of breath, lower extremity edema, fatigue,  palpitations, melena, hematuria, hemoptysis, diaphoresis, weakness, presyncope, syncope, orthopnea, and PND.   Home Medications    Prior to Admission medications   Medication Sig Start Date End Date Taking? Authorizing Provider  aspirin EC 81 MG EC tablet Take 1 tablet (81 mg total) by mouth daily. Swallow whole. 12/13/19   Kroeger, Lorelee Cover., PA-C  atorvastatin (LIPITOR) 80 MG tablet Take 1 tablet (80 mg total) by mouth daily. 12/13/19   Kroeger, Lorelee Cover., PA-C  CINNAMON PO Take 2 capsules by mouth daily.    [provider]  clopidogrel (PLAVIX) 75 MG tablet Take 1 tablet (75 mg total) by mouth daily. 01/12/20   Lelon Perla, MD  gabapentin (NEURONTIN) 300 MG capsule Take 1 capsule (300 mg total) by mouth at bedtime. 09/23/19   Magrinat, Virgie Dad, MD  metFORMIN (GLUCOPHAGE) 500 MG tablet Take 1 tablet (500 mg total) by mouth every morning. 01/08/20   Laqueta Linden, MD  metoprolol tartrate (LOPRESSOR) 25 MG tablet Take 0.5 tablets (12.5 mg total) by mouth 2 (two) times daily. 12/17/19   Lelon Perla, MD  Multiple Vitamin (MULTIVITAMIN) tablet Take 1 tablet by mouth daily.    [provider]  mupirocin ointment (BACTROBAN) 2 % Apply 1 application topically 2 (two) times daily as needed. Patient taking differently: Apply 1 application topically 2 (two) times daily as needed (dry patches on skin). 01/09/19   Briscoe Deutscher, DO  nitroGLYCERIN (NITROSTAT) 0.4 MG SL tablet Place 1 tablet (0.4 mg total) under the tongue every 5 (five) minutes x 3 doses as needed for chest pain. 12/12/19   Kroeger, Lorelee Cover., PA-C  tamoxifen (NOLVADEX) 20 MG tablet Take 1 tablet (20 mg total) by mouth daily. 09/23/19   Magrinat, Virgie Dad, MD  venlafaxine XR (EFFEXOR-XR) 75 MG 24 hr capsule Take 1 capsule (75 mg total) by mouth daily with breakfast. 09/23/19   Magrinat, Virgie Dad, MD  sertraline (ZOLOFT) 50 MG tablet Take 1 tablet (50 mg total) by mouth daily. 07/17/19 08/22/19  Laqueta Linden, MD    Family History    Family History  Problem Relation Age of Onset  . Diabetes Mother   . Hypertension Mother   . Heart disease Mother   . Stroke Mother   . Kidney disease Mother   . Obesity Mother   . Cancer Maternal Aunt        lung  . Cancer Maternal Uncle        lung  . Throat cancer Maternal Aunt    She indicated that her mother is deceased. She indicated that her father is deceased. She indicated that one of her two maternal aunts is deceased. She indicated that her maternal uncle is deceased.  Social History    Social History   Socioeconomic History  . Marital status: Legally Separated    Spouse name: Not on  file  . Number of children: Not on file  . Years of education: Not on file  . Highest education level: Not on file  Occupational History  . Not on file  Tobacco Use  . Smoking status: Never Smoker  . Smokeless tobacco: Never Used  Vaping Use  . Vaping Use: Never used  Substance and Sexual Activity  . Alcohol use: Yes    Comment: occasional wine  . Drug use: No  . Sexual activity: Not Currently  Other Topics Concern  . Not on file  Social History Narrative  . Not on file   Social Determinants of Health   Financial Resource Strain: Not on file  Food Insecurity: Not on file  Transportation Needs: Not on file  Physical Activity: Not on file  Stress: Not on file  Social Connections: Not on file  Intimate Partner Violence: Not on file     Review of Systems    General:  No chills, fever, night sweats or weight changes.  Cardiovascular:  No chest pain, dyspnea on exertion, edema, orthopnea, palpitations, paroxysmal nocturnal dyspnea. Dermatological: No rash, lesions/masses Respiratory: No cough, dyspnea Urologic: No hematuria, dysuria Abdominal:   No nausea, vomiting, diarrhea, bright red blood per rectum, melena, or hematemesis Neurologic:  No visual changes, wkns, changes in mental status. All other systems reviewed and are otherwise  negative except as noted above.  Physical Exam    VS:  BP (!) 148/96   Pulse 73   Ht 5\' 6"  (1.676 m)   Wt 267 lb 3.2 oz (121.2 kg)   BMI 43.13 kg/m  , BMI Body mass index is 43.13 kg/m. GEN: Well nourished, well developed, in no acute distress. HEENT: normal. Neck: Supple, no JVD, carotid bruits, or masses. Cardiac: RRR, no murmurs, rubs, or gallops. No clubbing, cyanosis, edema.  Radials/DP/PT 2+ and equal bilaterally.  Respiratory:  Respirations regular and unlabored, clear to auscultation bilaterally. GI: Soft, nontender, nondistended, BS + x 4. MS: no deformity or atrophy. Skin: warm and dry, no rash. Neuro:  Strength and sensation are intact. Psych: Normal affect.  Accessory Clinical Findings    Recent Labs: 09/23/2019: ALT 13 12/12/2019: BUN 12; Creatinine, Ser 0.85; Potassium 4.0; Sodium 138 12/30/2019: Hemoglobin 12.0; Platelets 247   Recent Lipid Panel    Component Value Date/Time   CHOL 116 12/12/2019 0055   CHOL 138 08/07/2019 1432   TRIG 83 12/12/2019 0055   HDL 57 12/12/2019 0055   HDL 69 08/07/2019 1432   CHOLHDL 2.0 12/12/2019 0055   VLDL 17 12/12/2019 0055   LDLCALC 42 12/12/2019 0055   LDLCALC 54 08/07/2019 1432    ECG personally reviewed by me today-normal sinus rhythm septal infarct undetermined age 51 bpm  EKG 12/23/2019 Normal sinus rhythm heart rate 64  Echocardiogram 12/12/2019  IMPRESSIONS    1. Left ventricular ejection fraction, by estimation, is 65 to 70%. The  left ventricle has normal function. The left ventricle has no regional  wall motion abnormalities. Left ventricular diastolic parameters are  consistent with Grade I diastolic  dysfunction (impaired relaxation).  2. Right ventricular systolic function is normal. The right ventricular  size is normal.  3. The mitral valve is normal in structure. No evidence of mitral valve  regurgitation. No evidence of mitral stenosis.  4. The aortic valve was not well visualized.  Aortic valve regurgitation  is not visualized. No aortic stenosis is present.  5. The inferior vena cava is normal in size with greater than 50%  respiratory variability, suggesting right atrial pressure of 3 mmHg.  Assessment & Plan   1.  Essential hypertension-BP today 148/96.  Her blood pressures at home have been ranging 120s over 70s at home. Continue metoprolol Heart healthy low-sodium diet-salty 6 given Increase physical activity as tolerated Maintain blood pressure log  NSTEMI-no chest pain today.  Underwent cardiac catheterization with PCA, noted to have thrombotic occlusion of small RI-POBA (too small to stent) residual 50% PDA, normal LVEF.  Had 1 episode of chest pain after managing a stressful class.  Took nitroglycerin which relieved her pain. Continue aspirin, metoprolol, nitroglycerin Start Imdur 15 mg daily Heart healthy low-sodium diet-salty 6 given Increase physical activity as tolerated Mindfulness stress reduction sheet given  Severe class III obesity-BMI 41 weight today 267 pounds.  Underwent gastric bypass 2005. Continue to work with the weight loss center.  History of breast cancer-mastectomy 08/2018 Follows with oncology  Disposition: Follow-up with Dr. Stanford Breed in 6 months.  Roberta Ng. Avanell Banwart NP-C    02/06/2020, 2:52 PM [facility] [place] [address] Office [phone] Fax [phone]  Notice: This dictation was prepared with Dragon dictation along with smaller phrase technology. Any transcriptional errors that result from this process are unintentional and may not be corrected upon review.  I spent 15 minutes examining this patient, reviewing medications, and using patient centered shared decision making involving her cardiac care.  Prior to her visit I spent greater than 20 minutes reviewing her past medical history,  medications, and prior cardiac tests.

## 2020-02-06 ENCOUNTER — Other Ambulatory Visit: Payer: Self-pay

## 2020-02-06 ENCOUNTER — Encounter: Payer: Self-pay | Admitting: General Practice

## 2020-02-06 ENCOUNTER — Ambulatory Visit: Payer: BC Managed Care – PPO | Admitting: General Practice

## 2020-02-06 VITALS — BP 148/96 | HR 73 | Ht 66.0 in | Wt 267.2 lb

## 2020-02-06 DIAGNOSIS — I10 Essential (primary) hypertension: Secondary | ICD-10-CM | POA: Diagnosis not present

## 2020-02-06 DIAGNOSIS — I2510 Atherosclerotic heart disease of native coronary artery without angina pectoris: Secondary | ICD-10-CM

## 2020-02-06 DIAGNOSIS — Z6841 Body Mass Index (BMI) 40.0 and over, adult: Secondary | ICD-10-CM

## 2020-02-06 DIAGNOSIS — Z9861 Coronary angioplasty status: Secondary | ICD-10-CM

## 2020-02-06 DIAGNOSIS — I214 Non-ST elevation (NSTEMI) myocardial infarction: Secondary | ICD-10-CM | POA: Diagnosis not present

## 2020-02-06 DIAGNOSIS — Z853 Personal history of malignant neoplasm of breast: Secondary | ICD-10-CM

## 2020-02-06 MED ORDER — ISOSORBIDE MONONITRATE ER 30 MG PO TB24: 15.0000 mg | ORAL_TABLET | Freq: Every day | ORAL | 6 refills | Status: DC

## 2020-02-06 NOTE — Patient Instructions (Addendum)
Medication Instructions:  START IMDUR 15MG  (1/2 TAB) DAILY *If you need a refill on your cardiac medications before your next appointment, please call your pharmacy*  Lab Work:   Testing/Procedures:  NONE    NONE  Special Instructions PLEASE READ AND FOLLOW STRESS REDUCTION TIPS-ATTACHED  PLEASE READ AND FOLLOW SALTY 6-ATTACHED-1,800mg  daily  PLEASE INCREASE PHYSICAL ACTIVITY AS TOLERATED  TAKE AND LOG YOUR WEIGHT 2 TIMES A WEEK AND BRING TO FOLLOW UP APPOINTMENT  Follow-Up: Your next appointment:  1 month(s) In Person with Kirk Ruths, MD OR IF UNAVAILABLE [place], FNP-C   At [facility], you and your health needs are our priority.  As part of our continuing mission to provide you with exceptional heart care, we have created designated Provider Care Teams.  These Care Teams include your primary Cardiologist (physician) and Advanced Practice Providers (APPs -  Physician Assistants and Nurse Practitioners) who all work together to provide you with the care you need, when you need it.            6 SALTY THINGS TO AVOID     1,800MG  DAILY     Mindfulness-Based Stress Reduction Mindfulness-based stress reduction (MBSR) is a program that helps people learn to practice mindfulness. Mindfulness is the practice of intentionally paying attention to the present moment. MBSR focuses on developing self-awareness, which allows you to respond to life stress without judgment or negative emotions. It can be learned and practiced through techniques such as education, breathing exercises, meditation, and yoga. MBSR includes several mindfulness techniques in one program. MBSR works best when you understand the treatment, are willing to try new things, and can commit to spending time practicing what you learn. MBSR training may include learning about:  How your emotions, thoughts, and reactions affect your body.  New ways to respond to things that cause negative thoughts to start  (triggers).  How to notice your thoughts and let go of them.  Practicing awareness of everyday things that you normally do without thinking.  The techniques and goals of different types of meditation. What are the benefits of MBSR? MBSR can have many benefits, which include helping you to:  Develop self-awareness. This refers to knowing and understanding yourself.  Learn skills and attitudes that help you to participate in your own health care.  Learn new ways to care for yourself.  Be more accepting about how things are, and let things go.  Be less judgmental and approach things with an open mind.  Be patient with yourself and trust yourself more. MBSR has also been shown to:  Reduce negative emotions, such as depression and anxiety.  Improve memory and focus.  Change how you sense and approach pain.  Boost your body's ability to fight infections.  Help you connect better with other people.  Improve your sense of well-being. Follow these instructions at home:  Find a local in-person or online MBSR program.  Set aside some time regularly for mindfulness practice.  Find a mindfulness practice that works best for you. This may include one or more of the following: ? Meditation. Meditation involves focusing your mind on a certain thought or activity. ? Breathing awareness exercises. These help you to stay present by focusing on your breath. ? Body scan. For this practice, you lie down and pay attention to each part of your body from head to toe. You can identify tension and soreness and intentionally relax parts of your body. ? Yoga. Yoga involves stretching and breathing, and it  can improve your ability to move and be flexible. It can also provide an experience of testing your body's limits, which can help you release stress. ? Mindful eating. This way of eating involves focusing on the taste, texture, color, and smell of each bite of food. Because this slows down eating  and helps you feel full sooner, it can be an important part of a weight-loss plan.  Find a podcast or recording that provides guidance for breathing awareness, body scan, or meditation exercises. You can listen to these any time when you have a free moment to rest without distractions.  Follow your treatment plan as told by your health care provider. This may include taking regular medicines and making changes to your diet or lifestyle as recommended.   How to practice mindfulness To do a basic awareness exercise:  Find a comfortable place to sit.  Pay attention to the present moment. Observe your thoughts, feelings, and surroundings just as they are.  Avoid placing judgment on yourself, your feelings, or your surroundings. Make note of any judgment that comes up, and let it go.  Your mind may wander, and that is okay. Make note of when your thoughts drift, and return your attention to the present moment. To do basic mindfulness meditation:  Find a comfortable place to sit. This may include a stable chair or a firm floor cushion. ? Sit upright with your back straight. Let your arms fall next to your side with your hands resting on your legs. ? If sitting in a chair, rest your feet flat on the floor. ? If sitting on a cushion, cross your legs in front of you.  Keep your head in a neutral position with your chin dropped slightly. Relax your jaw and rest the tip of your tongue on the roof of your mouth. Drop your gaze to the floor. You can close your eyes if you like.  Breathe normally and pay attention to your breath. Feel the air moving in and out of your nose. Feel your belly expanding and relaxing with each breath.  Your mind may wander, and that is okay. Make note of when your thoughts drift, and return your attention to your breath.  Avoid placing judgment on yourself, your feelings, or your surroundings. Make note of any judgment or feelings that come up, let them go, and bring your  attention back to your breath.  When you are ready, lift your gaze or open your eyes. Pay attention to how your body feels after the meditation. Where to find more information You can find more information about MBSR from:  Your health care provider.  Community-based meditation centers or programs.  Programs offered near you. Summary  Mindfulness-based stress reduction (MBSR) is a program that teaches you how to intentionally pay attention to the present moment. It is used with other treatments to help you cope better with daily stress, emotions, and pain.  MBSR focuses on developing self-awareness, which allows you to respond to life stress without judgment or negative emotions.  MBSR programs may involve learning different mindfulness practices, such as breathing exercises, meditation, yoga, body scan, or mindful eating. Find a mindfulness practice that works best for you, and set aside time for it on a regular basis. This information is not intended to replace advice given to you by your health care provider. Make sure you discuss any questions you have with your health care provider. Document Revised: 09/05/2019 Document Reviewed: 09/05/2019 Elsevier Patient Education  2021 Elsevier  Inc.

## 2020-02-09 ENCOUNTER — Telehealth: Payer: Self-pay | Admitting: General Practice

## 2020-02-09 MED ORDER — RANOLAZINE ER 500 MG PO TB12
500.0000 mg | ORAL_TABLET | Freq: Two times a day (BID) | ORAL | 5 refills | Status: DC
Start: 2020-02-09 — End: 2020-06-08

## 2020-02-09 NOTE — Telephone Encounter (Signed)
Spoke with patient and informed patient of Roberta [name]'s changes. Stop Imdur start Ranexa 500mg  BID (Ranexa doesn't come in 250mg  BID, unable to cut tablet in half.) Continue stress reduction. Patient verbalized understanding.

## 2020-02-09 NOTE — Telephone Encounter (Signed)
° ° ° °  Pt c/o medication issue:  1. Name of Medication:   isosorbide mononitrate (IMDUR) 30 MG 24 hr tablet    2. How are you currently taking this medication (dosage and times per day)? Take 0.5 tablets (15 mg total) by mouth daily.  3. Are you having a reaction (difficulty breathing--STAT)?   4. What is your medication issue? Pt said she's having bad reaction with this med. She said she had a really bad headache after taking this meds on Saturday

## 2020-02-09 NOTE — Telephone Encounter (Signed)
Spoke with patient. Patient prescribed 15mg  of Imdur on 2/4. She took a dose Saturday and Sunday and developed a severe headache. She did not take the medication this morning and the headache has resolved. No other symptoms at this time. Will route to Coletta Memos, NP for advise.

## 2020-02-10 ENCOUNTER — Other Ambulatory Visit: Payer: Self-pay | Admitting: Occupational Medicine

## 2020-02-10 ENCOUNTER — Other Ambulatory Visit: Payer: Self-pay

## 2020-02-10 ENCOUNTER — Ambulatory Visit: Payer: Self-pay

## 2020-02-10 DIAGNOSIS — M25561 Pain in right knee: Secondary | ICD-10-CM

## 2020-03-01 ENCOUNTER — Other Ambulatory Visit: Payer: Self-pay

## 2020-03-01 MED ORDER — GABAPENTIN 300 MG PO CAPS: 300.0000 mg | ORAL_CAPSULE | Freq: Every day | ORAL | 4 refills | Status: DC

## 2020-03-01 NOTE — Telephone Encounter (Signed)
Pt called in to update pharmacy to [place] [facility]. Requested gabapentin refill called in there.

## 2020-03-06 ENCOUNTER — Other Ambulatory Visit: Payer: Self-pay | Admitting: Cardiology

## 2020-03-06 ENCOUNTER — Telehealth (INDEPENDENT_AMBULATORY_CARE_PROVIDER_SITE_OTHER): Payer: BC Managed Care – PPO | Admitting: Cardiology

## 2020-03-06 DIAGNOSIS — R600 Localized edema: Secondary | ICD-10-CM

## 2020-03-06 DIAGNOSIS — I10 Essential (primary) hypertension: Secondary | ICD-10-CM

## 2020-03-06 DIAGNOSIS — I2510 Atherosclerotic heart disease of native coronary artery without angina pectoris: Secondary | ICD-10-CM

## 2020-03-06 MED ORDER — HYDROCHLOROTHIAZIDE 12.5 MG PO CAPS: 12.5000 mg | ORAL_CAPSULE | Freq: Every day | ORAL | 2 refills | Status: DC

## 2020-03-06 NOTE — Telephone Encounter (Signed)
    Received call from patient stating that she has been having unilateral LE edema which she has had in the last. She states that this was previously controlled with low dose HCTZ. She was taken off this in the past due to hypotension which resolved. Recent labs show stable creatinine therefore will send HCTZ 12.5 to pharmacy. She has upcoming appointment with Coletta Memos, NP 03/09/20. She will need labs at follow up. BP stable today per patient report at 111/66.   Kathyrn Drown NP-C [place] Pager: [phone]

## 2020-03-07 NOTE — Progress Notes (Unsigned)
Cardiology Clinic Note   Patient Name: Roberta [name] Date of Encounter: 03/09/2020  Primary Care Provider:  [name], MD Primary Cardiologist:  Kirk Ruths, MD  Patient Profile    Roberta [name] 55 year old female presents the clinic today for follow-up evaluation of her coronary artery disease status post NSTEMI and lower extremity swelling.  Past Medical History    Past Medical History:  Diagnosis Date  . Back pain   . Cancer [facility])    multifocal left breast  . Depression   . Hip pain   . Hypertension   . Obesity   . PONV (postoperative nausea and vomiting)   . Prediabetes   . Swelling    bilat LE  . Wears glasses    Past Surgical History:  Procedure Laterality Date  . CHOLECYSTECTOMY    . COLONOSCOPY    . CORONARY BALLOON ANGIOPLASTY N/A 12/11/2019   Procedure: CORONARY BALLOON ANGIOPLASTY;  Surgeon: [name], MD;  Location: [facility];  Service: Cardiovascular;  Laterality: N/A;  . DILATION AND CURETTAGE OF UTERUS    . GASTRIC BYPASS    . KNEE SURGERY  2002   Ligament repair  . LEFT HEART CATH AND CORONARY ANGIOGRAPHY N/A 12/11/2019   Procedure: LEFT HEART CATH AND CORONARY ANGIOGRAPHY;  Surgeon: [name], MD;  Location: [facility];  Service: Cardiovascular;  Laterality: N/A;  . MASTECTOMY Left 2019  . MASTECTOMY W/ SENTINEL NODE BIOPSY Left 10/01/2018   Procedure: LEFT TOTAL MASTECTOMY WITH SENTINEL LYMPH NODE BIOPSY AND BLUE DYE INJECTION;  Surgeon: Fanny Skates, MD;  Location: [facility];  Service: General;  Laterality: Left;    Allergies  Allergies  Allergen Reactions  . Lisinopril Swelling    Swelling of the tongue  . Penicillins     UNSPECIFIED CHILDHOOD REACTION  . Norvasc [Amlodipine Besylate] Rash  . Red Dye Rash  . Tetanus Toxoids Swelling    Swelling at site of injection    History of Present Illness    Ms. Bisig has a PMH of left breast cancer status postmastectomy 8/19, prediabetes,  hypertension and STEMI 12/11/2019.  She underwent cardiac catheterization which showed small RI branch with a rhombic occlusion.  She was treated with p.o. PA (too small to stent).  There was residual 50% PDA narrowing.  Her troponins peaked at 6812.  Her echocardiogram showed normal LV function with grade 1 diastolic dysfunction.  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  She presented to the clinic 12/23/2019 and saw Kerin Ransom, PA-C.  She indicated that since she had been discharged from the hospital her blood pressure was low.  She was told to decrease her Lopressor to 12.5 mg daily.  She denied any episodes of syncope or presyncope.  On presentation she again noted that her blood pressure was low.  She did bring her blood pressure cuff and her blood pressure readings were reviewed.  She was concerned about diastolic blood pressure readings in the 50s.  Her systolic readings ranged from 104-140.  She was otherwise doing well.  She denied chest pain and unusual dyspnea.  She was also being seen by the weight loss center.  She contacted nurse triage line on 01/15/2020 and reported that she was having low blood pressures.  Her hydrochlorothiazide was discontinued at that time.  She contacted nurse triage line on 01/30/2020 and reported that she had had intermittent episodes of chest pain.  She had taken nitroglycerin.  She presented to  the clinic 02/26/2020 for follow-up evaluation  and stated she had not had any further episodes of chest pain.  She reported that she had a stressful class with some unruly students and feels that the stress was the main contributor to her chest pain.  She did take 1 nitroglycerin which relieved her pain.  We reviewed her medications and Imdur.  I  started her on 15 mg daily, gave her the salty 6 diet sheet, had her increase her physical activity as tolerated, asked her to maintain a blood pressure log, and follow-up in 1 month.  She did not tolerate Imdur due to  headaches.  She was started on Ranexa 500 mg twice daily.  She contacted the after-hours triage line on 03/06/2020.  She reported unilateral leg edema and was prescribed hydrochlorothiazide 12.5.  She presents the clinic today for follow-up evaluation states she was not able to tolerate Imdur due to headaches.  She was transitioned to Ranexa which she is tolerating well.  She denies further episodes of chest pain.  She reports that the hydrochlorothiazide has helped somewhat with her left lower extremity edema.  We discussed using lower extremity support stockings.  I will give her the  support stocking sheet.  I will order a BMP and have her follow-up with Dr. Stanford Breed in 6 months.  Today she denies chest pain,  increased lower extremity edema, fatigue, palpitations, melena, hematuria, hemoptysis, diaphoresis, weakness, weight gain, orthopnea, and PND.   Home Medications    Prior to Admission medications   Medication Sig Start Date End Date Taking? Authorizing Provider  aspirin EC 81 MG EC tablet Take 1 tablet (81 mg total) by mouth daily. Swallow whole. 12/13/19   Kroeger, Lorelee Cover., PA-C  atorvastatin (LIPITOR) 80 MG tablet Take 1 tablet (80 mg total) by mouth daily. 12/13/19   Kroeger, Lorelee Cover., PA-C  CINNAMON PO Take 2 capsules by mouth daily.    [provider]  clopidogrel (PLAVIX) 75 MG tablet Take 1 tablet (75 mg total) by mouth daily. 01/12/20   Lelon Perla, MD  gabapentin (NEURONTIN) 300 MG capsule Take 1 capsule (300 mg total) by mouth at bedtime. 03/01/20   Magrinat, Virgie Dad, MD  hydrochlorothiazide (MICROZIDE) 12.5 MG capsule Take 1 capsule (12.5 mg total) by mouth daily. 03/06/20 03/06/21  Kathyrn Drown D, NP  metFORMIN (GLUCOPHAGE) 500 MG tablet Take 1 tablet (500 mg total) by mouth every morning. 01/08/20   Laqueta Linden, MD  metoprolol tartrate (LOPRESSOR) 25 MG tablet Take 0.5 tablets (12.5 mg total) by mouth 2 (two) times daily. 12/17/19   Lelon Perla, MD  Multiple Vitamin (MULTIVITAMIN) tablet Take 1 tablet by mouth daily.    [provider]  mupirocin ointment (BACTROBAN) 2 % Apply 1 application topically 2 (two) times daily as needed. Patient taking differently: Apply 1 application topically 2 (two) times daily as needed (dry patches on skin). 01/09/19   Briscoe Deutscher, DO  nitroGLYCERIN (NITROSTAT) 0.4 MG SL tablet Place 1 tablet (0.4 mg total) under the tongue every 5 (five) minutes x 3 doses as needed for chest pain. 12/12/19   Kroeger, Lorelee Cover., PA-C  ranolazine (RANEXA) 500 MG 12 hr tablet Take 1 tablet (500 mg total) by mouth 2 (two) times daily. 02/09/20   Deberah Pelton, NP  tamoxifen (NOLVADEX) 20 MG tablet Take 1 tablet (20 mg total) by mouth daily. 09/23/19   Magrinat, Virgie Dad, MD  venlafaxine XR (EFFEXOR-XR) 75 MG 24 hr capsule  Take 1 capsule (75 mg total) by mouth daily with breakfast. 09/23/19   Magrinat, Virgie Dad, MD  sertraline (ZOLOFT) 50 MG tablet Take 1 tablet (50 mg total) by mouth daily. 07/17/19 08/22/19  Laqueta Linden, MD    Family History    Family History  Problem Relation Age of Onset  . Diabetes Mother   . Hypertension Mother   . Heart disease Mother   . Stroke Mother   . Kidney disease Mother   . Obesity Mother   . Cancer Maternal Aunt        lung  . Cancer Maternal Uncle        lung  . Throat cancer Maternal Aunt    She indicated that her mother is deceased. She indicated that her father is deceased. She indicated that one of her two maternal aunts is deceased. She indicated that her maternal uncle is deceased.  Social History    Social History   Socioeconomic History  . Marital status: Legally Separated    Spouse name: Not on file  . Number of children: Not on file  . Years of education: Not on file  . Highest education level: Not on file  Occupational History  . Not on file  Tobacco Use  . Smoking status: Never Smoker  . Smokeless tobacco: Never Used  Vaping Use  .  Vaping Use: Never used  Substance and Sexual Activity  . Alcohol use: Yes    Comment: occasional wine  . Drug use: No  . Sexual activity: Not Currently  Other Topics Concern  . Not on file  Social History Narrative  . Not on file   Social Determinants of Health   Financial Resource Strain: Not on file  Food Insecurity: Not on file  Transportation Needs: Not on file  Physical Activity: Not on file  Stress: Not on file  Social Connections: Not on file  Intimate Partner Violence: Not on file     Review of Systems    General:  No chills, fever, night sweats or weight changes.  Cardiovascular:  No chest pain, dyspnea on exertion, edema, orthopnea, palpitations, paroxysmal nocturnal dyspnea. Dermatological: No rash, lesions/masses Respiratory: No cough, dyspnea Urologic: No hematuria, dysuria Abdominal:   No nausea, vomiting, diarrhea, bright red blood per rectum, melena, or hematemesis Neurologic:  No visual changes, wkns, changes in mental status. All other systems reviewed and are otherwise negative except as noted above.  Physical Exam    VS:  BP 117/71 (BP Location: Left Arm, Patient Position: Sitting, Cuff Size: Large)   Pulse 68   Ht 5\' 6"  (1.676 m)   Wt 264 lb (119.7 kg)   SpO2 99%   BMI 42.61 kg/m  , BMI Body mass index is 42.61 kg/m. GEN: Well nourished, well developed, in no acute distress. HEENT: normal. Neck: Supple, no JVD, carotid bruits, or masses. Cardiac: RRR, no murmurs, rubs, or gallops. No clubbing, cyanosis, generalized bilateral lower extremity nonpitting edema left greater than right.  Radials/DP/PT 2+ and equal bilaterally.  Respiratory:  Respirations regular and unlabored, clear to auscultation bilaterally. GI: Soft, nontender, nondistended, BS + x 4. MS: no deformity or atrophy. Skin: warm and dry, no rash. Neuro:  Strength and sensation are intact. Psych: Normal affect.  Accessory Clinical Findings    Recent Labs: 09/23/2019: ALT  13 12/12/2019: BUN 12; Creatinine, Ser 0.85; Potassium 4.0; Sodium 138 12/30/2019: Hemoglobin 12.0; Platelets 247   Recent Lipid Panel    Component Value Date/Time  CHOL 116 12/12/2019 0055   CHOL 138 08/07/2019 1432   TRIG 83 12/12/2019 0055   HDL 57 12/12/2019 0055   HDL 69 08/07/2019 1432   CHOLHDL 2.0 12/12/2019 0055   VLDL 17 12/12/2019 0055   LDLCALC 42 12/12/2019 0055   LDLCALC 54 08/07/2019 1432    ECG personally reviewed by me today-none today.  EKG 02/06/2020 normal sinus rhythm septal infarct undetermined age 77 bpm  EKG 12/23/2019 Normal sinus rhythm heart rate 64  Echocardiogram 12/12/2019  IMPRESSIONS    1. Left ventricular ejection fraction, by estimation, is 65 to 70%. The  left ventricle has normal function. The left ventricle has no regional  wall motion abnormalities. Left ventricular diastolic parameters are  consistent with Grade I diastolic  dysfunction (impaired relaxation).  2. Right ventricular systolic function is normal. The right ventricular  size is normal.  3. The mitral valve is normal in structure. No evidence of mitral valve  regurgitation. No evidence of mitral stenosis.  4. The aortic valve was not well visualized. Aortic valve regurgitation  is not visualized. No aortic stenosis is present.  5. The inferior vena cava is normal in size with greater than 50%  respiratory variability, suggesting right atrial pressure of 3 mmHg.  Assessment & Plan   1.  Essential hypertension-BP today 117/71.    Much better control at home.  Continue metoprolol, hydrochlorothiazide Heart healthy low-sodium diet-salty 6 given Increase physical activity as tolerated Maintain blood pressure log Order BMP  NSTEMI-no chest pain today.  Underwent cardiac catheterization with PCA, noted to have thrombotic occlusion of small RI-POBA (too small to stent) residual 50% PDA, normal LVEF.  Had 1 episode of chest pain after managing a stressful class.   Took nitroglycerin which relieved her pain. Continue aspirin, metoprolol, nitroglycerin Continue Ranexa Heart healthy low-sodium diet-salty 6 given Increase physical activity as tolerated Mindfulness stress reduction sheet given  Lower extremity edema-euvolemic today.  Left greater than right.  Generalized nonpitting left lower extremity edema. Continue hydrochlorothiazide Low-sodium diet Lower extremity support stockings-[place] support stocking sheet given.  Severe class III obesity-BMI 41 weight today 264 pounds.  Underwent gastric bypass 2005. Continue to work with the weight loss center.  History of breast cancer-mastectomy 08/2018 Follows with oncology  Disposition: Follow-up with Dr. Stanford Breed in 6 months.   Jossie Ng. Cleaver NP-C    03/09/2020, 8:35 AM [place] [address] Office [phone] Fax [phone]  Notice: This dictation was prepared with Dragon dictation along with smaller phrase technology. Any transcriptional errors that result from this process are unintentional and may not be corrected upon review.  I spent 10 minutes examining this patient, reviewing medications, and using patient centered shared decision making involving her cardiac care.  Prior to her visit I spent greater than 20 minutes reviewing her past medical history,  medications, and prior cardiac tests.

## 2020-03-09 ENCOUNTER — Other Ambulatory Visit: Payer: Self-pay

## 2020-03-09 ENCOUNTER — Encounter: Payer: Self-pay | Admitting: General Practice

## 2020-03-09 ENCOUNTER — Ambulatory Visit: Payer: BC Managed Care – PPO | Admitting: General Practice

## 2020-03-09 VITALS — BP 117/71 | HR 68 | Ht 66.0 in | Wt 264.0 lb

## 2020-03-09 DIAGNOSIS — I214 Non-ST elevation (NSTEMI) myocardial infarction: Secondary | ICD-10-CM

## 2020-03-09 DIAGNOSIS — I10 Essential (primary) hypertension: Secondary | ICD-10-CM

## 2020-03-09 DIAGNOSIS — Z853 Personal history of malignant neoplasm of breast: Secondary | ICD-10-CM

## 2020-03-09 DIAGNOSIS — R600 Localized edema: Secondary | ICD-10-CM

## 2020-03-09 DIAGNOSIS — Z6841 Body Mass Index (BMI) 40.0 and over, adult: Secondary | ICD-10-CM

## 2020-03-09 DIAGNOSIS — Z79899 Other long term (current) drug therapy: Secondary | ICD-10-CM

## 2020-03-09 LAB — BASIC METABOLIC PANEL
BUN/Creatinine Ratio: 20 (ref 9–23)
BUN: 19 mg/dL (ref 6–24)
CO2: 24 mmol/L (ref 20–29)
Calcium: 8.6 mg/dL — ABNORMAL LOW (ref 8.7–10.2)
Chloride: 100 mmol/L (ref 96–106)
Creatinine, Ser: 0.95 mg/dL (ref 0.57–1.00)
Glucose: 104 mg/dL — ABNORMAL HIGH (ref 65–99)
Potassium: 4.1 mmol/L (ref 3.5–5.2)
Sodium: 135 mmol/L (ref 134–144)
eGFR: 70 mL/min/{1.73_m2} (ref >59)

## 2020-03-09 MED ORDER — NITROGLYCERIN 0.4 MG SL SUBL: 0.4000 mg | SUBLINGUAL_TABLET | SUBLINGUAL | 3 refills | Status: DC | PRN

## 2020-03-09 NOTE — Addendum Note (Signed)
Addended by: Waylan Rocher on: 03/09/2020 09:13 AM   Modules accepted: Orders

## 2020-03-09 NOTE — Patient Instructions (Signed)
Medication Instructions:  The current medical regimen is effective;  continue present plan and medications as directed. Please refer to the Current Medication list given to you today.  *If you need a refill on your cardiac medications before your next appointment, please call your pharmacy*  Lab Work: BMET TODAY3 If you have labs (blood work) drawn today and your tests are completely normal, you will receive your results only by:  [facility] (if you have MyChart) OR A paper copy in the mail.  If you have any lab test that is abnormal or we need to change your treatment, we will call you to review the results. You may go to any [facility] that is convenient for you however, we do have a lab in our office that is able to assist you. You DO NOT need an appointment for our lab. The lab is open 8:00am and closes at 4:00pm. Lunch 12:45 - 1:45pm.  Testing/Procedures: NONE  Special Instructions PLEASE READ AND FOLLOW SALTY 6-ATTACHED-1,800 mg daily  PLEASE INCREASE PHYSICAL ACTIVITY AS TOLERATED  PLEASE PURCHASE AND WEAR COMPRESSION STOCKINGS DAILY AND TAKE OFF AT BEDTIME. Compression stockings are elastic socks that squeeze the legs. They help to increase blood flow to the legs and to decrease swelling in the legs from fluid retention, and reduce the chance of developing blood clots in the lower legs. Please put on in the AM when dressing and off at night when dressing for bed.  LET THEM KNOW THAT YOU NEED KNEE HIGH'S WITH COMPRESSION OF 15-20 mmhg. [employer]  [place] ([place] [phone]); [place], [place] [address]; [phone]  EMAIL   [email or web address].  PLEASE MAKE SURE TO ELEVATE YOUR FEET & LEGS WHILE SITTING, THIS WILL HELP WITH THE SWELLING ALSO.   Follow-Up: Your next appointment:  6 month(s) In Person with Kirk Ruths, MD OR IF UNAVAILABLE JESSE CLEAVER, FNP-C   Please call our office 2 months(JUNE) in advance to schedule this appointment(AUG)   At [facility], you and your health needs are our priority.  As part of our continuing mission to provide you with exceptional heart care, we have created designated Provider Care Teams.  These Care Teams include your primary Cardiologist (physician) and Advanced Practice Providers (APPs -  Physician Assistants and Nurse Practitioners) who all work together to provide you with the care you need, when you need it.

## 2020-03-10 ENCOUNTER — Ambulatory Visit (INDEPENDENT_AMBULATORY_CARE_PROVIDER_SITE_OTHER): Payer: BC Managed Care – PPO | Admitting: Family Medicine

## 2020-03-10 ENCOUNTER — Other Ambulatory Visit: Payer: Self-pay

## 2020-03-10 ENCOUNTER — Encounter (INDEPENDENT_AMBULATORY_CARE_PROVIDER_SITE_OTHER): Payer: Self-pay | Admitting: Family Medicine

## 2020-03-10 VITALS — BP 114/70 | HR 64 | Temp 98.5°F | Ht 66.0 in | Wt 259.0 lb

## 2020-03-10 DIAGNOSIS — Z9189 Other specified personal risk factors, not elsewhere classified: Secondary | ICD-10-CM

## 2020-03-10 DIAGNOSIS — R7303 Prediabetes: Secondary | ICD-10-CM

## 2020-03-10 DIAGNOSIS — Z6841 Body Mass Index (BMI) 40.0 and over, adult: Secondary | ICD-10-CM

## 2020-03-10 MED ORDER — METFORMIN HCL 500 MG PO TABS: 500.0000 mg | ORAL_TABLET | Freq: Every morning | ORAL | 0 refills | Status: DC

## 2020-03-11 NOTE — Progress Notes (Signed)
Chief Complaint:   OBESITY Roberta [name] is here to discuss her progress with her obesity treatment plan along with follow-up of her obesity related diagnoses. Roberta [name] is on the Category 3 Plan or keeping a food journal and adhering to recommended goals of 1450-1600 calories and 95+ grams of protein daily and states she is following her eating plan approximately 75-80% of the time. Roberta [name] states she is exercising with a personal trainer for 30 minutes 2 times per week.  Today's visit was #: 42 Starting weight: 292 lbs Starting date: 10/08/2017 Today's weight: 259 lbs Today's date: 03/10/2020 Total lbs lost to date: 33 Total lbs lost since last in-office visit: 1  Interim History: Roberta [name] has been working on diet and exercise, and continues to lose weight. She has struggled more with decreasing Na+ and she has questions about lunch meats.  Subjective:   1. Pre-diabetes Roberta [name] has been out of metformin for a few weeks. She has still done well with weight loss however. She is ready to restart, and she denies signs of hypoglycemia.   2. At risk for diabetes mellitus Roberta [name] is at higher than average risk for developing diabetes due to obesity.   Assessment/Plan:   1. Pre-diabetes Roberta [name] will continue to work on weight loss, exercise, and decreasing simple carbohydrates to help decrease the risk of diabetes. We will refill metformin for 1 month.  - metFORMIN (GLUCOPHAGE) 500 MG tablet; Take 1 tablet (500 mg total) by mouth every morning.  Dispense: 30 tablet; Refill: 0  2. At risk for diabetes mellitus Roberta [name] was given approximately 15 minutes of diabetes education and counseling today. We discussed intensive lifestyle modifications today with an emphasis on weight loss as well as increasing exercise and decreasing simple carbohydrates in her diet. We also reviewed medication options with an emphasis on risk versus benefit of those discussed.   Repetitive spaced learning was employed today to elicit  superior memory formation and behavioral change.  3. Class 3 severe obesity with serious comorbidity and body mass index (BMI) of 40.0 to 44.9 in adult, unspecified obesity type (HCC) Roberta [name] is currently in the action stage of change. As such, her goal is to continue with weight loss efforts. She has agreed to the Category 2 Plan with lunch options.   Roberta [name] was educated on decreasing "deli meats" like bologna, salami, pepperoni, pastrami, but low Na+ [place] and chicken are ok.  Exercise goals: As is.  Behavioral modification strategies: decreasing simple carbohydrates and decreasing sodium intake.  Roberta [name] has agreed to follow-up with our clinic in 2 to 3 weeks with Dr. Jearld Shines. She was informed of the importance of frequent follow-up visits to maximize her success with intensive lifestyle modifications for her multiple health conditions.   Objective:   Blood pressure 114/70, pulse 64, temperature 98.5 F (36.9 C), height 5\' 6"  (1.676 m), weight 259 lb (117.5 kg), SpO2 100 %. Body mass index is 41.8 kg/m.  General: Cooperative, alert, well developed, in no acute distress. HEENT: Conjunctivae and lids unremarkable. Cardiovascular: Regular rhythm.  Lungs: Normal work of breathing. Neurologic: No focal deficits.   Lab Results  Component Value Date   CREATININE 0.95 03/09/2020   BUN 19 03/09/2020   NA 135 03/09/2020   K 4.1 03/09/2020   CL 100 03/09/2020   CO2 24 03/09/2020   Lab Results  Component Value Date   ALT 13 09/23/2019   AST 21 09/23/2019   ALKPHOS 59 09/23/2019   BILITOT 0.3 09/23/2019   Lab  Results  Component Value Date   HGBA1C 5.9 (H) 12/12/2019   HGBA1C 6.0 (H) 12/11/2019   HGBA1C 5.9 (H) 08/07/2019   HGBA1C 6.4 (H) 01/27/2019   HGBA1C 6.0 (H) 09/25/2018   Lab Results  Component Value Date   INSULIN 7.2 08/07/2019   INSULIN 13.4 01/31/2018   INSULIN 10.6 10/08/2017   Lab Results  Component Value Date   TSH 1.480 10/08/2017   Lab Results  Component  Value Date   CHOL 116 12/12/2019   HDL 57 12/12/2019   LDLCALC 42 12/12/2019   TRIG 83 12/12/2019   CHOLHDL 2.0 12/12/2019   Lab Results  Component Value Date   WBC 4.9 12/30/2019   HGB 12.0 12/30/2019   HCT 37.3 12/30/2019   MCV 81 12/30/2019   PLT 247 12/30/2019   Lab Results  Component Value Date   IRON 162 (H) 01/22/2017   FERRITIN 14.5 01/22/2017   Attestation Statements:   Reviewed by clinician on day of visit: allergies, medications, problem list, medical history, surgical history, family history, social history, and previous encounter notes.   I, Trixie Dredge, am acting as transcriptionist for Dennard Nip, MD.  I have reviewed the above documentation for accuracy and completeness, and I agree with the above. -  Dennard Nip, MD

## 2020-03-22 NOTE — Progress Notes (Signed)
[place]  [phone] [phone]    ID: Roberta [name] DOB: July 13, 1965  MR#: [id number]  QDI#:[id number]  Patient Care Team: [name], MD as PCP - General (Internal Medicine) Stanford Breed Denice Bors, MD as PCP - Cardiology (Cardiology) Amillia Biffle, Virgie Dad, MD as Consulting Physician (Oncology) Kyung Rudd, MD as Consulting Physician (Radiation Oncology) Juanita Craver, MD as Consulting Physician (Gastroenterology) Sherlyn Hay, DO as Consulting Physician (Obstetrics and Gynecology) OTHER MD:   CHIEF COMPLAINT: Estrogen receptor positive breast cancer (s/p left mastectomy)  CURRENT TREATMENT: Anastrozole   INTERVAL HISTORY: Roberta [name] returns today for follow up of her estrogen receptor positive breast cancer.  She continues on tamoxifen. She also takes gabapentin, which has helped the hot flashes considerably.  It also helped a little bit of tingling she was having in her fingers.  Since her last visit, she developed less chest pressure, presented to the ED and underwent left heart catheterization on 12/11/2019. This showed two-vessel coronary artery disease with thrombotic occlusion of a small ramus intermedius and 50% stenosis of a small right PDA.  There appears to be mildly reduced left ventricular systolic function.  PTCA of the proximal ramus intermedius lesion was performed.  Stent placement was not performed due to small size of the vessel and tortuosity.  She also had an echo 12/12/2019: This showed a left ventricular ejection fraction in the 65 to 70% range and no wall motion abnormalities, with right ventricular systolic function.  The valves were unremarkable.  She was started on dual antiplatelet therapy to be continued for 12 months.  Her most recent right screening mammography at [facility] on 07/28/2019 showed: breast density category C; no evidence of malignancy.    REVIEW OF SYSTEMS: Roberta [name] is having some bruising issues  secondary to the antiplatelet therapy.  She has not had any further episodes of chest pain.  She is continuing to work full-time.  She says she has a little bit of leg swelling at times.  She is moderately constipated.  A detailed review of systems today was otherwise stable   COVID 19 VACCINATION STATUS: Status post Pfizer x2 with booster November 2021   HISTORY OF CURRENT ILLNESS: From the original intake note:  Roberta [name] had routine screening mammography on 07/25/2018 which was normal.  Less than a month later she was found by Dr. Terri Piedra to have palpable lumps in the superior left breast at a clinic exam.  Roberta [name] underwent unilateral left diagnostic mammography with tomography and left breast ultrasonography at [facility] on 08/23/2018 showing: Breast Density Category C. No suspicious masses are seen on the tomosynthesis images of the left breast. There are 2 groups of indeterminate calcifications however, one in the superior anterior superficial left breast which spans 8 mm. In the upper inner left breast, there is a 2.2 cm area of loosely grouped amorphous calcifications. Normal fibroglandular tissue in the upper inner and upper outer quadrants of the left breast.  There were no sonographic abnormalities to correspond with the masses identified on the clinical breast exam.  Accordingly on 08/29/2018 she proceeded to stereotactic biopsy of the left breast area in question. The pathology from this procedure showed ([id number]): invasive mammary carcinoma, nottingham grade 2 of 3. Prognostic indicators significant for: estrogen receptor, 100% positive and progesterone receptor, 2% positive, both with strong staining intensity. Proliferation marker Ki67 at 10%. HER2 negative (0+) by immunohistochemistry.  An additional biopsy of the left breast was performed on the same  day. The pathology from this procedure showed ([id number]): ductal carcinoma in situ. Prognostic indicators significant  for: estrogen receptor, 95% positie and progesterone receptor, 100% positive, both with strong staining intensity.  Finally, she underwent a left axillary ultrasound on 09/02/2018 showing: No left axillary adenopathy.  The patient's subsequent history is as detailed below.   PAST MEDICAL HISTORY: Past Medical History:  Diagnosis Date   Back pain    Cancer ([place])    multifocal left breast   Depression    Hip pain    Hypertension    Obesity    PONV (postoperative nausea and vomiting)    Prediabetes    Swelling    bilat LE   Wears glasses     PAST SURGICAL HISTORY: Past Surgical History:  Procedure Laterality Date   CHOLECYSTECTOMY     COLONOSCOPY     CORONARY BALLOON ANGIOPLASTY N/A 12/11/2019   Procedure: CORONARY BALLOON ANGIOPLASTY;  Surgeon: [name], MD;  Location: [facility];  Service: Cardiovascular;  Laterality: N/A;   DILATION AND CURETTAGE OF UTERUS     GASTRIC BYPASS     KNEE SURGERY  2002   Ligament repair   LEFT HEART CATH AND CORONARY ANGIOGRAPHY N/A 12/11/2019   Procedure: LEFT HEART CATH AND CORONARY ANGIOGRAPHY;  Surgeon: [name], MD;  Location: [facility];  Service: Cardiovascular;  Laterality: N/A;   MASTECTOMY Left 2019   MASTECTOMY W/ SENTINEL NODE BIOPSY Left 10/01/2018   Procedure: LEFT TOTAL MASTECTOMY WITH SENTINEL LYMPH NODE BIOPSY AND BLUE DYE INJECTION;  Surgeon: Fanny Skates, MD;  Location: [facility];  Service: General;  Laterality: Left;    FAMILY HISTORY: Family History  Problem Relation Age of Onset   Diabetes Mother    Hypertension Mother    Heart disease Mother    Stroke Mother    Kidney disease Mother    Obesity Mother    Cancer Maternal Aunt        lung   Cancer Maternal Uncle        lung   Throat cancer Maternal Aunt    Roberta [name]'s father died in his mid 63s from alcoholism.  The patient's mother died at age 39 from end-stage renal disease.  The patient had 2 brothers, 3  sisters, one brother dying from complications of diabetes.  There is no cancer in the immediate family.  There are 2 maternal uncles and 2 maternal aunts who died from lung cancer, all of whom smoked.  A maternal aunt also had a history of throat cancer.   GYNECOLOGIC HISTORY:  No LMP recorded. (Menstrual status: Perimenopausal). 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  She is divorced, lives alone.   ADVANCED DIRECTIVES: She intends to name her sister Roberta [name] as healthcare power of attorney.  Roberta [name] can be reached at [phone].   HEALTH MAINTENANCE: Social History   Tobacco Use   Smoking status: Never Smoker   Smokeless tobacco: Never Used  Vaping Use   Vaping Use: Never used  Substance Use Topics   Alcohol use: Yes    Comment: occasional wine   Drug use: No    Colonoscopy: August 2020  PAP: Up-to-date  Bone density: 2005   Allergies  Allergen Reactions  Lisinopril Swelling    Swelling of the tongue   Penicillins     UNSPECIFIED CHILDHOOD REACTION   Imdur [Isosorbide Nitrate] Other (See Comments)    headache   Norvasc [Amlodipine Besylate] Rash   Red Dye Rash   Tetanus Toxoids Swelling    Swelling at site of injection    Current Outpatient Medications  Medication Sig Dispense Refill   aspirin EC 81 MG EC tablet Take 1 tablet (81 mg total) by mouth daily. Swallow whole. 30 tablet 11   atorvastatin (LIPITOR) 80 MG tablet Take 1 tablet (80 mg total) by mouth daily. 90 tablet 3   CINNAMON PO Take 2 capsules by mouth daily.     clopidogrel (PLAVIX) 75 MG tablet Take 1 tablet (75 mg total) by mouth daily. 90 tablet 3   gabapentin (NEURONTIN) 300 MG capsule Take 1 capsule (300 mg  total) by mouth at bedtime. 90 capsule 4   hydrochlorothiazide (MICROZIDE) 12.5 MG capsule Take 1 capsule (12.5 mg total) by mouth daily. 60 capsule 2   metFORMIN (GLUCOPHAGE) 500 MG tablet Take 1 tablet (500 mg total) by mouth every morning. 30 tablet 0   metoprolol tartrate (LOPRESSOR) 25 MG tablet Take 0.5 tablets (12.5 mg total) by mouth 2 (two) times daily. 60 tablet 6   Multiple Vitamin (MULTIVITAMIN) tablet Take 1 tablet by mouth daily.     nitroGLYCERIN (NITROSTAT) 0.4 MG SL tablet Place 1 tablet (0.4 mg total) under the tongue every 5 (five) minutes x 3 doses as needed for chest pain. 25 tablet 3   ranolazine (RANEXA) 500 MG 12 hr tablet Take 1 tablet (500 mg total) by mouth 2 (two) times daily. 60 tablet 5   tamoxifen (NOLVADEX) 20 MG tablet Take 1 tablet (20 mg total) by mouth daily. 90 tablet 4   venlafaxine XR (EFFEXOR-XR) 75 MG 24 hr capsule Take 1 capsule (75 mg total) by mouth daily with breakfast. 90 capsule 4   No current facility-administered medications for this visit.    OBJECTIVE: African-American woman who appears younger than stated age  73:   03/23/20 1505  BP: 136/81  Pulse: 77  Resp: 18  Temp: 97.7 F (36.5 C)  SpO2: 100%   Wt Readings from Last 3 Encounters:  03/23/20 260 lb 3.2 oz (118 kg)  03/23/20 259 lb (117.5 kg)  03/10/20 259 lb (117.5 kg)   Body mass index is 42 kg/m.    ECOG FS:1 - Symptomatic but completely ambulatory  Sclerae unicteric, EOMs intact Wearing a mask No cervical or supraclavicular adenopathy Lungs no rales or rhonchi Heart regular rate and rhythm, no murmurs appreciated Abd soft, obese, nontender, positive bowel sounds MSK no focal spinal tenderness, no upper extremity lymphedema Neuro: nonfocal, well oriented, appropriate affect Breasts: The right breast is benign.  The left breast is status post mastectomy.  There is no evidence of local recurrence.  Both axillae are benign.   LAB RESULTS:  CMP      Component Value Date/Time   NA 135 03/09/2020 1140   K 4.1 03/09/2020 1140   CL 100 03/09/2020 1140   CO2 24 03/09/2020 1140   GLUCOSE 104 (H) 03/09/2020 1140   GLUCOSE 119 (H) 12/12/2019 0055   BUN 19 03/09/2020 1140   CREATININE 0.95 03/09/2020 1140   CREATININE 0.95 09/04/2018 0840   CALCIUM 8.6 (L) 03/09/2020 1140   PROT 7.2 09/23/2019 1441   PROT 7.0 08/07/2019 1432   ALBUMIN 3.0 (L) 09/23/2019 1441   ALBUMIN 3.8  08/07/2019 1432   AST 21 09/23/2019 1441   AST 13 (L) 09/04/2018 0840   ALT 13 09/23/2019 1441   ALT 10 09/04/2018 0840   ALKPHOS 59 09/23/2019 1441   BILITOT 0.3 09/23/2019 1441   BILITOT 0.2 08/07/2019 1432   BILITOT 0.4 09/04/2018 0840   GFRNONAA >60 12/12/2019 0055   GFRNONAA >60 09/04/2018 0840   GFRAA >60 09/23/2019 1441   GFRAA >60 09/04/2018 0840    No results found for: TOTALPROTELP, ALBUMINELP, A1GS, A2GS, BETS, BETA2SER, GAMS, MSPIKE, SPEI  No results found for: KPAFRELGTCHN, LAMBDASER, KAPLAMBRATIO  Lab Results  Component Value Date   WBC 4.6 03/23/2020   NEUTROABS 2.5 03/23/2020   HGB 10.1 (L) 03/23/2020   HCT 32.1 (L) 03/23/2020   MCV 80.9 03/23/2020   PLT 221 03/23/2020    No results found for: LABCA2  No components found for: NIDPOE423  No results for input(s): INR in the last 168 hours.  No results found for: LABCA2  No results found for: NTI144  No results found for: RXV400  No results found for: QQP619  No results found for: CA2729  No components found for: HGQUANT  No results found for: CEA1 / No results found for: CEA1   No results found for: AFPTUMOR  No results found for: CHROMOGRNA  No results found for: HGBA, HGBA2QUANT, HGBFQUANT, HGBSQUAN (Hemoglobinopathy evaluation)   No results found for: LDH  Lab Results  Component Value Date   IRON 162 (H) 01/22/2017   (Iron and TIBC)  Lab Results  Component Value Date   FERRITIN 14.5 01/22/2017    Urinalysis    Component Value Date/Time   COLORURINE  YELLOW 06/25/2007 0640   APPEARANCEUR CLOUDY (A) 06/25/2007 0640   LABSPEC 1.016 06/25/2007 0640   PHURINE 6.0 06/25/2007 0640   GLUCOSEU NEGATIVE 06/25/2007 0640   HGBUR LARGE (A) 06/25/2007 0640   BILIRUBINUR NEGATIVE 06/25/2007 0640   KETONESUR NEGATIVE 06/25/2007 0640   PROTEINUR 100 (A) 06/25/2007 0640   UROBILINOGEN 0.2 06/25/2007 0640   NITRITE POSITIVE (A) 06/25/2007 0640   LEUKOCYTESUR MODERATE (A) 06/25/2007 0640    STUDIES:  No results found.   ELIGIBLE FOR AVAILABLE RESEARCH PROTOCOL: no   ASSESSMENT: 55 y.o. [place], [place] woman status post left breast upper inner quadrant biopsy for a clinical T2 N0, stage IB invasive ductal carcinoma, grade 2, estrogen and progesterone receptor positive, HER-2 not amplified, with an MIB-1-1 of 2%  (a) a second biopsy same day different quadrant showed ductal carcinoma in situ  (1) left breast s/p mastectomy on 10/01/2018 for a  pT1b pN0, stage IA invasive ductal carcinoma, grade 2, margins negative.  (a) a total of 5 lymph nodes biopsied were negative.    (2) Oncotype score of 20 predicts a risk of recurrence outside the breast within the next 9 years of 6% if her only systemic therapy is antiestrogens for 5 years.  Also predicts no benefit from chemotherapy.  (3) adjuvant radiation not indicated  (4)  tamoxifen started November 2020  (a) switched to anastrozole beginning March 2022 following NSTEMI and angioplasty December 2021  PLAN: Roberta [name] is about a year and a half out from definitive surgery for her breast cancer with no evidence of disease recurrence.  This is very favorable.  She has tolerated tamoxifen well but I am concerned about the small possibility of clotting risk.  I think he would do better with anastrozole, which compared to placebo shows no increase in clotting risk.  We discussed the  possible toxicities side effects and complications of anastrozole and she is willing to make the switch.  I have entered the  appropriate orders.  I encouraged her to be as active as possible although her work hours leave her little time for exercise  She will see me again in September.  At that point we will start follow-up here on a once a year basis  She knows to call for any other issue that may develop before then  Total encounter time 25 minutes.Roberta [name] C. Devlin Brink, MD  03/23/20 3:37 PM Medical Oncology and Hematology [facility] [place], [place] [address] Tel. [phone]    Fax. [phone]    I, Wilburn Mylar, am acting as scribe for Dr. Virgie Dad. Roberta [name].  I, Lurline Del MD, have reviewed the above documentation for accuracy and completeness, and I agree with the above.   *Total Encounter Time as defined by the Centers for Medicare and Medicaid Services includes, in addition to the face-to-face time of a patient visit (documented in the note above) non-face-to-face time: obtaining and reviewing outside history, ordering and reviewing medications, tests or procedures, care coordination (communications with other health care professionals or caregivers) and documentation in the medical record.

## 2020-03-22 NOTE — Progress Notes (Signed)
Subjective:    Patient ID: Roberta [name], female    DOB: Dec 02, 1965, 56 y.o.   MRN: [id number]  HPI The patient is here for follow up from her MI  Admitted December 12/2019 for NSTEMI.  She was found to have 2 vessel dz - s/p PTCA of prox ramus intermedius.  She was started on brilinta, asa, metoprolol and atorvastatin 80 mg daily   She had to take one ntg once only.  She had chest pressure - it was the same as what she had felt before.     She is exercising regularly - she works with a Clinical research associate. She follows at the healthy weight [facility] wellness clinic.     Medications and allergies reviewed with patient and updated if appropriate.  Patient Active Problem List   Diagnosis Date Noted  . History of breast cancer 12/23/2019  . At risk for diabetes mellitus 12/23/2019  . CAD S/P percutaneous coronary angioplasty 12/12/2019  . NSTEMI (non-ST elevated myocardial infarction) ([place]) 12/11/2019  . Essential hypertension 12/04/2019  . S/P gastric bypass 12/04/2019  . At risk for deficient intake of food 12/04/2019  . Premenstrual dysphoric disorder 12/22/2018  . Anxiety and depression 12/10/2018  . Morbid obesity with BMI of 40.0-44.9, adult ([place]) 09/04/2018  . Malignant neoplasm of upper-inner quadrant of left breast in female, estrogen receptor positive ([place]) 09/02/2018  . Vitamin D deficiency, taking vit D 5000 IU daily 02/04/2018  . Urticaria 01/17/2018  . Weight gain following gastric bypass surgery 06/25/2017  . 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 Left hip pain 02/10/2017  . Microcytic anemia, mild 01/21/2017  . Malabsorption, due to gastic bypass 2005 12/13/2015  . Prediabetes 12/13/2015  . Class 3 severe obesity with serious comorbidity and body mass index (BMI) of 40.0 to 44.9 in adult ([place]) 12/13/2015  . Globus sensation 12/13/2015  . Leg edema, L > R 12/13/2015  . Dense breasts 12/13/2011  . 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 Hypertension 11/10/2011    Current Outpatient Medications on File Prior to Visit  Medication Sig Dispense Refill  . aspirin EC 81 MG EC tablet Take 1 tablet (81 mg total) by mouth daily. Swallow whole. 30 tablet 11  . atorvastatin (LIPITOR) 80 MG tablet Take 1 tablet (80 mg total) by mouth daily. 90 tablet 3  . CINNAMON PO Take 2 capsules by mouth daily.    . clopidogrel (PLAVIX) 75 MG tablet Take 1 tablet (75 mg total) by mouth daily. 90 tablet 3  . gabapentin (NEURONTIN) 300 MG capsule Take 1 capsule (300 mg total) by mouth at bedtime. 90 capsule 4  . hydrochlorothiazide (MICROZIDE) 12.5 MG capsule Take 1 capsule (12.5 mg total) by mouth daily. 60 capsule 2  . metFORMIN (GLUCOPHAGE) 500 MG tablet Take 1 tablet (500 mg total) by mouth every morning. 30 tablet 0  . metoprolol tartrate (LOPRESSOR) 25 MG tablet Take 0.5 tablets (12.5 mg total) by mouth 2 (two) times daily. 60 tablet 6  . Multiple Vitamin (MULTIVITAMIN) tablet Take 1 tablet by mouth daily.    . nitroGLYCERIN (NITROSTAT) 0.4 MG SL tablet Place 1 tablet (0.4 mg total) under the tongue every 5 (five) minutes x 3 doses as needed for chest pain. 25 tablet 3  . ranolazine (RANEXA) 500 MG 12 hr tablet Take 1 tablet (500 mg total) by mouth 2 (two) times daily. 60 tablet 5  . tamoxifen (  NOLVADEX) 20 MG tablet Take 1 tablet (20 mg total) by mouth daily. 90 tablet 4  . venlafaxine XR (EFFEXOR-XR) 75 MG 24 hr capsule Take 1 capsule (75 mg total) by mouth daily with breakfast. 90 capsule 4   No current facility-administered medications on file prior to visit.    Past Medical History:  Diagnosis Date  . Back pain   . Cancer [facility])    multifocal left breast  . Depression   . Hip pain   . Hypertension   . Obesity   . PONV (postoperative nausea and vomiting)   . Prediabetes   . Swelling    bilat LE  . Wears glasses     Past Surgical History:  Procedure Laterality Date  . CHOLECYSTECTOMY     . COLONOSCOPY    . CORONARY BALLOON ANGIOPLASTY N/A 12/11/2019   Procedure: CORONARY BALLOON ANGIOPLASTY;  Surgeon: [name], MD;  Location: [facility];  Service: Cardiovascular;  Laterality: N/A;  . DILATION AND CURETTAGE OF UTERUS    . GASTRIC BYPASS    . KNEE SURGERY  2002   Ligament repair  . LEFT HEART CATH AND CORONARY ANGIOGRAPHY N/A 12/11/2019   Procedure: LEFT HEART CATH AND CORONARY ANGIOGRAPHY;  Surgeon: [name], MD;  Location: [facility];  Service: Cardiovascular;  Laterality: N/A;  . MASTECTOMY Left 2019  . MASTECTOMY W/ SENTINEL NODE BIOPSY Left 10/01/2018   Procedure: LEFT TOTAL MASTECTOMY WITH SENTINEL LYMPH NODE BIOPSY AND BLUE DYE INJECTION;  Surgeon: Fanny Skates, MD;  Location: [facility];  Service: General;  Laterality: Left;    Social History   Socioeconomic History  . Marital status: Legally Separated    Spouse name: Not on file  . Number of children: Not on file  . Years of education: Not on file  . Highest education level: Not on file  Occupational History  . Not on file  Tobacco Use  . Smoking status: Never Smoker  . Smokeless tobacco: Never Used  Vaping Use  . Vaping Use: Never used  Substance and Sexual Activity  . Alcohol use: Yes    Comment: occasional wine  . Drug use: No  . Sexual activity: Not Currently  Other Topics Concern  . Not on file  Social History Narrative  . Not on file   Social Determinants of Health   Financial Resource Strain: Not on file  Food Insecurity: Not on file  Transportation Needs: Not on file  Physical Activity: Not on file  Stress: Not on file  Social Connections: Not on file    Family History  Problem Relation Age of Onset  . Diabetes Mother   . Hypertension Mother   . Heart disease Mother   . Stroke Mother   . Kidney disease Mother   . Obesity Mother   . Cancer Maternal Aunt        lung  . Cancer Maternal Uncle        lung  . Throat cancer Maternal Aunt     Review  of Systems  Constitutional: Negative for fever.  Respiratory: Negative for cough, shortness of breath and wheezing.   Cardiovascular: Positive for leg swelling (LLE - compression socks). Negative for chest pain and palpitations.  Gastrointestinal: Positive for constipation.  Neurological: Negative for dizziness, light-headedness and headaches.  Hematological: Bruises/bleeds easily.       Objective:   Vitals:   03/23/20 1323  BP: 118/76  Pulse: 81  Temp: 98.4 F (36.9 C)  SpO2:  96%   BP Readings from Last 3 Encounters:  03/23/20 118/76  03/10/20 114/70  03/09/20 117/71   Wt Readings from Last 3 Encounters:  03/23/20 259 lb (117.5 kg)  03/10/20 259 lb (117.5 kg)  03/09/20 264 lb (119.7 kg)   Body mass index is 41.8 kg/m.   Depression screen [facility] 2/9 03/23/2020 08/25/2019 10/08/2017 06/25/2017 08/08/2014  Decreased Interest 0 0 1 0 0  Down, Depressed, Hopeless 0 0 1 0 1  PHQ - 2 Score 0 0 2 0 1  Altered sleeping 1 3 1  - -  Tired, decreased energy 0 0 1 - -  Change in appetite 0 0 0 - -  Feeling bad or failure about yourself  0 2 0 - -  Trouble concentrating 0 1 0 - -  Moving slowly or fidgety/restless 0 0 0 - -  Suicidal thoughts 0 0 0 - -  PHQ-9 Score 1 6 4  - -  Difficult doing work/chores Not difficult at all Not difficult at all Not difficult at all - -  Some recent data might be hidden    GAD 7 : Generalized Anxiety Score 03/23/2020  Nervous, Anxious, on Edge 0  Control/stop worrying 0  Worry too much - different things 0  Trouble relaxing 0  Restless 0  Easily annoyed or irritable 0  Afraid - awful might happen 0  Total GAD 7 Score 0      Physical Exam    Constitutional: Appears well-developed and well-nourished. No distress.  HENT:  Head: Normocephalic and atraumatic.  Neck: Neck supple. No tracheal deviation present. No thyromegaly present.  No cervical lymphadenopathy Cardiovascular: Normal rate, regular rhythm and normal heart sounds.   No murmur  heard. No carotid bruit .  No edema Pulmonary/Chest: Effort normal and breath sounds normal. No respiratory distress. No has no wheezes. No rales.  Skin: Skin is warm and dry. Not diaphoretic.  Psychiatric: Normal mood and affect. Behavior is normal.      Assessment & Plan:   Screened for depression using the PHQ 9 scale.  No evidence of depression.    Screened for anxiety using GAD7 Scale.  No evidence of anxiety.    See Problem List for Assessment and Plan of chronic medical problems.    This visit occurred during the SARS-CoV-2 public health emergency.  Safety protocols were in place, including screening questions prior to the visit, additional usage of staff PPE, and extensive cleaning of exam room while observing appropriate contact time as indicated for disinfecting solutions.

## 2020-03-23 ENCOUNTER — Ambulatory Visit: Payer: BC Managed Care – PPO | Admitting: Internal Medicine

## 2020-03-23 ENCOUNTER — Inpatient Hospital Stay: Payer: BC Managed Care – PPO

## 2020-03-23 ENCOUNTER — Encounter: Payer: Self-pay | Admitting: Internal Medicine

## 2020-03-23 ENCOUNTER — Inpatient Hospital Stay: Payer: BC Managed Care – PPO | Attending: Oncology | Admitting: Oncology

## 2020-03-23 ENCOUNTER — Other Ambulatory Visit: Payer: Self-pay

## 2020-03-23 VITALS — BP 118/76 | HR 81 | Temp 98.4°F | Ht 66.0 in | Wt 259.0 lb

## 2020-03-23 VITALS — BP 136/81 | HR 77 | Temp 97.7°F | Resp 18 | Ht 66.0 in | Wt 260.2 lb

## 2020-03-23 DIAGNOSIS — R7303 Prediabetes: Secondary | ICD-10-CM

## 2020-03-23 DIAGNOSIS — I214 Non-ST elevation (NSTEMI) myocardial infarction: Secondary | ICD-10-CM | POA: Diagnosis not present

## 2020-03-23 DIAGNOSIS — Z79811 Long term (current) use of aromatase inhibitors: Secondary | ICD-10-CM | POA: Diagnosis not present

## 2020-03-23 DIAGNOSIS — Z8349 Family history of other endocrine, nutritional and metabolic diseases: Secondary | ICD-10-CM | POA: Insufficient documentation

## 2020-03-23 DIAGNOSIS — I10 Essential (primary) hypertension: Secondary | ICD-10-CM | POA: Diagnosis not present

## 2020-03-23 DIAGNOSIS — Z7982 Long term (current) use of aspirin: Secondary | ICD-10-CM | POA: Insufficient documentation

## 2020-03-23 DIAGNOSIS — Z170 Estrogen receptor positive status [ER+]: Secondary | ICD-10-CM | POA: Diagnosis not present

## 2020-03-23 DIAGNOSIS — Z9861 Coronary angioplasty status: Secondary | ICD-10-CM

## 2020-03-23 DIAGNOSIS — Z833 Family history of diabetes mellitus: Secondary | ICD-10-CM | POA: Diagnosis not present

## 2020-03-23 DIAGNOSIS — Z7984 Long term (current) use of oral hypoglycemic drugs: Secondary | ICD-10-CM | POA: Diagnosis not present

## 2020-03-23 DIAGNOSIS — I2510 Atherosclerotic heart disease of native coronary artery without angina pectoris: Secondary | ICD-10-CM

## 2020-03-23 DIAGNOSIS — Z8249 Family history of ischemic heart disease and other diseases of the circulatory system: Secondary | ICD-10-CM | POA: Insufficient documentation

## 2020-03-23 DIAGNOSIS — I252 Old myocardial infarction: Secondary | ICD-10-CM | POA: Insufficient documentation

## 2020-03-23 DIAGNOSIS — Z9012 Acquired absence of left breast and nipple: Secondary | ICD-10-CM | POA: Diagnosis not present

## 2020-03-23 DIAGNOSIS — C50212 Malignant neoplasm of upper-inner quadrant of left female breast: Secondary | ICD-10-CM

## 2020-03-23 DIAGNOSIS — F32A Depression, unspecified: Secondary | ICD-10-CM

## 2020-03-23 DIAGNOSIS — Z801 Family history of malignant neoplasm of trachea, bronchus and lung: Secondary | ICD-10-CM | POA: Diagnosis not present

## 2020-03-23 DIAGNOSIS — Z79899 Other long term (current) drug therapy: Secondary | ICD-10-CM | POA: Insufficient documentation

## 2020-03-23 DIAGNOSIS — F419 Anxiety disorder, unspecified: Secondary | ICD-10-CM | POA: Diagnosis not present

## 2020-03-23 LAB — CBC WITH DIFFERENTIAL/PLATELET
Abs Immature Granulocytes: 0 10*3/uL (ref 0.00–0.07)
Basophils Absolute: 0 10*3/uL (ref 0.0–0.1)
Basophils Relative: 1 %
Eosinophils Absolute: 0.1 10*3/uL (ref 0.0–0.5)
Eosinophils Relative: 2 %
HCT: 32.1 % — ABNORMAL LOW (ref 36.0–46.0)
Hemoglobin: 10.1 g/dL — ABNORMAL LOW (ref 12.0–15.0)
Immature Granulocytes: 0 %
Lymphocytes Relative: 33 %
Lymphs Abs: 1.5 10*3/uL (ref 0.7–4.0)
MCH: 25.4 pg — ABNORMAL LOW (ref 26.0–34.0)
MCHC: 31.5 g/dL (ref 30.0–36.0)
MCV: 80.9 fL (ref 80.0–100.0)
Monocytes Absolute: 0.5 10*3/uL (ref 0.1–1.0)
Monocytes Relative: 11 %
Neutro Abs: 2.5 10*3/uL (ref 1.7–7.7)
Neutrophils Relative %: 53 %
Platelets: 221 10*3/uL (ref 150–400)
RBC: 3.97 MIL/uL (ref 3.87–5.11)
RDW: 16.1 % — ABNORMAL HIGH (ref 11.5–15.5)
WBC: 4.6 10*3/uL (ref 4.0–10.5)
nRBC: 0 % (ref 0.0–0.2)

## 2020-03-23 LAB — CMP (CANCER CENTER ONLY)
ALT: 30 U/L (ref 0–44)
AST: 33 U/L (ref 15–41)
Albumin: 3.2 g/dL — ABNORMAL LOW (ref 3.5–5.0)
Alkaline Phosphatase: 59 U/L (ref 38–126)
Anion gap: 7 (ref 5–15)
BUN: 20 mg/dL (ref 6–20)
CO2: 27 mmol/L (ref 22–32)
Calcium: 8.5 mg/dL — ABNORMAL LOW (ref 8.9–10.3)
Chloride: 104 mmol/L (ref 98–111)
Creatinine: 1.13 mg/dL — ABNORMAL HIGH (ref 0.44–1.00)
GFR, Estimated: 57 mL/min — ABNORMAL LOW (ref >60)
Glucose, Bld: 139 mg/dL — ABNORMAL HIGH (ref 70–99)
Potassium: 3.6 mmol/L (ref 3.5–5.1)
Sodium: 138 mmol/L (ref 135–145)
Total Bilirubin: 0.2 mg/dL — ABNORMAL LOW (ref 0.3–1.2)
Total Protein: 7.1 g/dL (ref 6.5–8.1)

## 2020-03-23 MED ORDER — ANASTROZOLE 1 MG PO TABS: 1.0000 mg | ORAL_TABLET | Freq: Every day | ORAL | 4 refills | Status: DC

## 2020-03-23 NOTE — Assessment & Plan Note (Signed)
S/p PTCA to RI- POBA On renexa, metoprolol, plavix, atorvastatin, ASA Following with cardiology Took ntg once  Exercising, working on weight loss BP well controlled, sugars controlled

## 2020-03-23 NOTE — Assessment & Plan Note (Addendum)
Chronic BP well controlled Continue hctz 12.5 mg qd, metorpolol 12.5 mg bid

## 2020-03-23 NOTE — Assessment & Plan Note (Signed)
Chronic Controlled, stable Continue effexor 75 mg daily  

## 2020-03-23 NOTE — Patient Instructions (Addendum)
   Medications changes include :   none    Follow up annually

## 2020-03-23 NOTE — Assessment & Plan Note (Signed)
Chronic Lab Results  Component Value Date   HGBA1C 5.9 (H) 12/12/2019    On metformin Continue diabetic diet, regular exercise Continue weight loss efforts

## 2020-03-24 ENCOUNTER — Telehealth: Payer: Self-pay | Admitting: Oncology

## 2020-03-24 NOTE — Telephone Encounter (Signed)
Scheduled appts per 3/22 los. Pt stated she would look at appts on MyChart and would call back to r/s if needed.

## 2020-03-25 ENCOUNTER — Ambulatory Visit (INDEPENDENT_AMBULATORY_CARE_PROVIDER_SITE_OTHER): Payer: BC Managed Care – PPO | Admitting: Family Medicine

## 2020-03-25 ENCOUNTER — Encounter (INDEPENDENT_AMBULATORY_CARE_PROVIDER_SITE_OTHER): Payer: Self-pay | Admitting: Family Medicine

## 2020-03-25 ENCOUNTER — Other Ambulatory Visit: Payer: Self-pay

## 2020-03-25 VITALS — BP 122/78 | HR 68 | Temp 98.6°F | Ht 66.0 in | Wt 256.0 lb

## 2020-03-25 DIAGNOSIS — R7303 Prediabetes: Secondary | ICD-10-CM

## 2020-03-25 DIAGNOSIS — E7849 Other hyperlipidemia: Secondary | ICD-10-CM | POA: Diagnosis not present

## 2020-03-25 DIAGNOSIS — Z6841 Body Mass Index (BMI) 40.0 and over, adult: Secondary | ICD-10-CM

## 2020-03-25 DIAGNOSIS — Z9189 Other specified personal risk factors, not elsewhere classified: Secondary | ICD-10-CM

## 2020-03-25 MED ORDER — NITROGLYCERIN 0.4 MG SL SUBL: 0.4000 mg | SUBLINGUAL_TABLET | SUBLINGUAL | 5 refills | Status: DC | PRN

## 2020-03-25 MED ORDER — METFORMIN HCL 500 MG PO TABS: 500.0000 mg | ORAL_TABLET | Freq: Every morning | ORAL | 0 refills | Status: DC

## 2020-03-30 NOTE — Progress Notes (Signed)
Chief Complaint:   OBESITY Roberta [name] is here to discuss her progress with her obesity treatment plan along with follow-up of her obesity related diagnoses. Roberta [name] is on the Category 3 Plan and states she is following her eating plan approximately 90-95% of the time. Roberta [name] states she is working with a trainer 30 minutes 2-3 times per week.  Today's visit was #: 28 Starting weight: 292 lbs Starting date: 10/08/2017 Today's weight: 256 lbs Today's date: 03/25/2020 Total lbs lost to date: 36 lbs Total lbs lost since last in-office visit: 3 lbs  Interim History: Roberta [name] has been busy following up with cardiology after her NSTEMI. She started working with a Clinical research associate and wants to be down a total of 50 lbs by the end of the year. She switched to low sodium [place] breast. She wants to et more protein in daily (doing minimum protein allotment at dinner meal).  Subjective:   1. Pre-diabetes Roberta [name]'s last A1c was 5.9. She is on Metformin with no GI side effects and minimal carb cravings.  Lab Results  Component Value Date   HGBA1C 5.9 (H) 12/12/2019   Lab Results  Component Value Date   INSULIN 7.2 08/07/2019   INSULIN 13.4 01/31/2018   INSULIN 10.6 10/08/2017   2. Other hyperlipidemia Roberta [name]'s 03/23/2020 lipid panel revealed LDL 42, triglycerides 83, and HD 57. She is on statin therapy.  Lab Results  Component Value Date   ALT 30 03/23/2020   AST 33 03/23/2020   ALKPHOS 59 03/23/2020   BILITOT 0.2 (L) 03/23/2020   Lab Results  Component Value Date   CHOL 116 12/12/2019   HDL 57 12/12/2019   LDLCALC 42 12/12/2019   TRIG 83 12/12/2019   CHOLHDL 2.0 12/12/2019    3. At risk for diabetes mellitus Roberta [name] is at higher than average risk for developing diabetes due to obesity.   Assessment/Plan:   1. Pre-diabetes Roberta [name] will continue to work on weight loss, exercise, and decreasing simple carbohydrates to help decrease the risk of diabetes.   - metFORMIN (GLUCOPHAGE) 500 MG tablet;  Take 1 tablet (500 mg total) by mouth every morning.  Dispense: 30 tablet; Refill: 0  2. Other hyperlipidemia Cardiovascular risk and specific lipid/LDL goals reviewed.  We discussed several lifestyle modifications today and Roberta [name] will continue to work on diet, exercise and weight loss efforts. Orders and follow up as documented in patient record. Continue statin therapy. No refill needed. Repeat labs in May 2022.  Counseling Intensive lifestyle modifications are the first line treatment for this issue. . Dietary changes: Increase soluble fiber. Decrease simple carbohydrates. . Exercise changes: Moderate to vigorous-intensity aerobic activity 150 minutes per week if tolerated. . Lipid-lowering medications: see documented in medical record.  3. At risk for diabetes mellitus Roberta [name] was given approximately 15 minutes of diabetes education and counseling today. We discussed intensive lifestyle modifications today with an emphasis on weight loss as well as increasing exercise and decreasing simple carbohydrates in her diet. We also reviewed medication options with an emphasis on risk versus benefit of those discussed.   Repetitive spaced learning was employed today to elicit superior memory formation and behavioral change.  4. Obesity with current BMI of 41.4 Roberta [name] is currently in the action stage of change. As such, her goal is to continue with weight loss efforts. She has agreed to the Category 3 Plan.   Exercise goals: As is  Behavioral modification strategies: increasing lean protein intake, meal planning and cooking strategies, keeping healthy foods  in the home and planning for success.  Roberta [name] has agreed to follow-up with our clinic in 3-4 weeks. She was informed of the importance of frequent follow-up visits to maximize her success with intensive lifestyle modifications for her multiple health conditions.   Objective:   Blood pressure 122/78, pulse 68, temperature 98.6 F (37 C),  temperature source Oral, height 5\' 6"  (1.676 m), weight 256 lb (116.1 kg), SpO2 100 %. Body mass index is 41.32 kg/m.  General: Cooperative, alert, well developed, in no acute distress. HEENT: Conjunctivae and lids unremarkable. Cardiovascular: Regular rhythm.  Lungs: Normal work of breathing. Neurologic: No focal deficits.   Lab Results  Component Value Date   CREATININE 1.13 (H) 03/23/2020   BUN 20 03/23/2020   NA 138 03/23/2020   K 3.6 03/23/2020   CL 104 03/23/2020   CO2 27 03/23/2020   Lab Results  Component Value Date   ALT 30 03/23/2020   AST 33 03/23/2020   ALKPHOS 59 03/23/2020   BILITOT 0.2 (L) 03/23/2020   Lab Results  Component Value Date   HGBA1C 5.9 (H) 12/12/2019   HGBA1C 6.0 (H) 12/11/2019   HGBA1C 5.9 (H) 08/07/2019   HGBA1C 6.4 (H) 01/27/2019   HGBA1C 6.0 (H) 09/25/2018   Lab Results  Component Value Date   INSULIN 7.2 08/07/2019   INSULIN 13.4 01/31/2018   INSULIN 10.6 10/08/2017   Lab Results  Component Value Date   TSH 1.480 10/08/2017   Lab Results  Component Value Date   CHOL 116 12/12/2019   HDL 57 12/12/2019   LDLCALC 42 12/12/2019   TRIG 83 12/12/2019   CHOLHDL 2.0 12/12/2019   Lab Results  Component Value Date   WBC 4.6 03/23/2020   HGB 10.1 (L) 03/23/2020   HCT 32.1 (L) 03/23/2020   MCV 80.9 03/23/2020   PLT 221 03/23/2020   Lab Results  Component Value Date   IRON 162 (H) 01/22/2017   FERRITIN 14.5 01/22/2017    Attestation Statements:   Reviewed by clinician on day of visit: allergies, medications, problem list, medical history, surgical history, family history, social history, and previous encounter notes.  Coral Ceo, am acting as transcriptionist for Coralie Common, MD.   I have reviewed the above documentation for accuracy and completeness, and I agree with the above. - Jinny Blossom, MD

## 2020-04-01 ENCOUNTER — Ambulatory Visit: Payer: BC Managed Care – PPO | Admitting: Cardiology

## 2020-04-01 ENCOUNTER — Ambulatory Visit: Payer: BC Managed Care – PPO | Admitting: Internal Medicine

## 2020-04-15 ENCOUNTER — Encounter: Payer: Self-pay | Admitting: Internal Medicine

## 2020-04-19 ENCOUNTER — Ambulatory Visit (INDEPENDENT_AMBULATORY_CARE_PROVIDER_SITE_OTHER): Payer: BC Managed Care – PPO | Admitting: Family Medicine

## 2020-04-19 ENCOUNTER — Encounter (INDEPENDENT_AMBULATORY_CARE_PROVIDER_SITE_OTHER): Payer: Self-pay | Admitting: Family Medicine

## 2020-04-19 ENCOUNTER — Other Ambulatory Visit: Payer: Self-pay | Admitting: Obstetrics and Gynecology

## 2020-04-19 ENCOUNTER — Other Ambulatory Visit: Payer: Self-pay

## 2020-04-19 VITALS — BP 116/76 | HR 61 | Temp 98.1°F | Ht 66.0 in | Wt 257.0 lb

## 2020-04-19 DIAGNOSIS — Z6841 Body Mass Index (BMI) 40.0 and over, adult: Secondary | ICD-10-CM

## 2020-04-19 DIAGNOSIS — Z9012 Acquired absence of left breast and nipple: Secondary | ICD-10-CM

## 2020-04-19 DIAGNOSIS — Z853 Personal history of malignant neoplasm of breast: Secondary | ICD-10-CM

## 2020-04-19 DIAGNOSIS — Z1231 Encounter for screening mammogram for malignant neoplasm of breast: Secondary | ICD-10-CM

## 2020-04-19 DIAGNOSIS — Z9189 Other specified personal risk factors, not elsewhere classified: Secondary | ICD-10-CM

## 2020-04-19 DIAGNOSIS — E7849 Other hyperlipidemia: Secondary | ICD-10-CM | POA: Diagnosis not present

## 2020-04-19 DIAGNOSIS — R7303 Prediabetes: Secondary | ICD-10-CM | POA: Diagnosis not present

## 2020-04-19 DIAGNOSIS — R7989 Other specified abnormal findings of blood chemistry: Secondary | ICD-10-CM

## 2020-04-19 DIAGNOSIS — E559 Vitamin D deficiency, unspecified: Secondary | ICD-10-CM

## 2020-04-19 MED ORDER — METFORMIN HCL 500 MG PO TABS: 500.0000 mg | ORAL_TABLET | Freq: Every morning | ORAL | 0 refills | Status: DC

## 2020-04-20 LAB — COMPREHENSIVE METABOLIC PANEL
ALT: 24 IU/L (ref 0–32)
AST: 28 IU/L (ref 0–40)
Albumin/Globulin Ratio: 1.1 — ABNORMAL LOW (ref 1.2–2.2)
Albumin: 3.4 g/dL — ABNORMAL LOW (ref 3.8–4.9)
Alkaline Phosphatase: 70 IU/L (ref 44–121)
BUN/Creatinine Ratio: 26 — ABNORMAL HIGH (ref 9–23)
BUN: 23 mg/dL (ref 6–24)
Bilirubin Total: <0.2 mg/dL (ref 0.0–1.2)
CO2: 22 mmol/L (ref 20–29)
Calcium: 8.4 mg/dL — ABNORMAL LOW (ref 8.7–10.2)
Chloride: 105 mmol/L (ref 96–106)
Creatinine, Ser: 0.89 mg/dL (ref 0.57–1.00)
Globulin, Total: 3.2 g/dL (ref 1.5–4.5)
Glucose: 79 mg/dL (ref 65–99)
Potassium: 4.4 mmol/L (ref 3.5–5.2)
Sodium: 142 mmol/L (ref 134–144)
Total Protein: 6.6 g/dL (ref 6.0–8.5)
eGFR: 76 mL/min/{1.73_m2} (ref >59)

## 2020-04-20 LAB — HEMOGLOBIN A1C
Est. average glucose Bld gHb Est-mCnc: 120 mg/dL
Hgb A1c MFr Bld: 5.8 % — ABNORMAL HIGH (ref 4.8–5.6)

## 2020-04-20 LAB — VITAMIN D 25 HYDROXY (VIT D DEFICIENCY, FRACTURES): Vit D, 25-Hydroxy: 34.8 ng/mL (ref 30.0–100.0)

## 2020-04-20 LAB — LIPID PANEL WITH LDL/HDL RATIO
Cholesterol, Total: 117 mg/dL (ref 100–199)
HDL: 72 mg/dL (ref >39)
LDL Chol Calc (NIH): 35 mg/dL (ref 0–99)
LDL/HDL Ratio: 0.5 ratio (ref 0.0–3.2)
Triglycerides: 33 mg/dL (ref 0–149)
VLDL Cholesterol Cal: 10 mg/dL (ref 5–40)

## 2020-04-20 LAB — INSULIN, RANDOM: INSULIN: 6.6 u[IU]/mL (ref 2.6–24.9)

## 2020-04-20 NOTE — Progress Notes (Signed)
Chief Complaint:   OBESITY Roberta [name] is here to discuss her progress with her obesity treatment plan along with follow-up of her obesity related diagnoses. Roberta [name] is on the Category 3 Plan and states she is following her eating plan approximately 90% of the time. Roberta [name] states she is doing cardio and weights 30 minutes 2 times per week.  Today's visit was #: 79 Starting weight: 292 lbs Starting date: 10/08/2017 Today's weight: 257 lbs Today's date: 04/19/2020 Total lbs lost to date: 35 lbs Total lbs lost since last in-office visit: 0  Interim History: Kristen is on spring break this week. She has been working out with a Clinical research associate and is noticing non-scale victories of needing to get clothes tailored. Pt is trying the rest of the month without her trainer to see if she can stay consistent by herself. She is trying to do plan and cook ahead for meal plan.  Subjective:   1. Pre-diabetes Roberta [name]'s last A1c in December 2021 was 5.9, and insulin level was 7.2 in August 2021. She is on Metformin daily.  2. Elevated serum creatinine Roberta [name]'s last creatinine was 1.13. She is on Lopressor and Microzide.  3. Other hyperlipidemia Roberta [name]'s last FLP was 4 months ago. She is on Lipitor.  4. Vitamin D deficiency Roberta [name] is not on Vit D supplement. She reports fatigue. Her last Vit D level was 1.5 years ago.  5. At risk for diabetes mellitus Roberta [name] is at higher than average risk for developing diabetes due to obesity.   Assessment/Plan:   1. Pre-diabetes Roberta [name] will continue to work on weight loss, exercise, and decreasing simple carbohydrates to help decrease the risk of diabetes. Check labs today.  - metFORMIN (GLUCOPHAGE) 500 MG tablet; Take 1 tablet (500 mg total) by mouth every morning.  Dispense: 30 tablet; Refill: 0  - Hemoglobin A1c - Insulin, random  2. Elevated serum creatinine Check labs today.  - Comprehensive metabolic panel  3. Other hyperlipidemia Cardiovascular risk and specific  lipid/LDL goals reviewed.  We discussed several lifestyle modifications today and Roberta [name] will continue to work on diet, exercise and weight loss efforts. Orders and follow up as documented in patient record. Check labs today.  Counseling Intensive lifestyle modifications are the first line treatment for this issue. . Dietary changes: Increase soluble fiber. Decrease simple carbohydrates. . Exercise changes: Moderate to vigorous-intensity aerobic activity 150 minutes per week if tolerated. . Lipid-lowering medications: see documented in medical record.  - Lipid Panel With LDL/HDL Ratio  4. Vitamin D deficiency Low Vitamin D level contributes to fatigue and are associated with obesity, breast, and colon cancer. She agrees to follow-up for routine testing of Vitamin D, at least 2-3 times per year to avoid over-replacement. Check labs today.  - VITAMIN D 25 Hydroxy (Vit-D Deficiency, Fractures)  5. At risk for diabetes mellitus Roberta [name] was given approximately 15 minutes of diabetes education and counseling today. We discussed intensive lifestyle modifications today with an emphasis on weight loss as well as increasing exercise and decreasing simple carbohydrates in her diet. We also reviewed medication options with an emphasis on risk versus benefit of those discussed.   Repetitive spaced learning was employed today to elicit superior memory formation and behavioral change.  6. Class 3 severe obesity with serious comorbidity and body mass index (BMI) of 50.0 to 59.9 in adult, unspecified obesity type (HCC) Roberta [name] is currently in the action stage of change. As such, her goal is to continue with weight loss efforts. She has  agreed to the Category 3 Plan.   Exercise goals: As is  Behavioral modification strategies: increasing lean protein intake, meal planning and cooking strategies and keeping healthy foods in the home.  Roberta [name] has agreed to follow-up with our clinic in 3-4 weeks. She was informed of  the importance of frequent follow-up visits to maximize her success with intensive lifestyle modifications for her multiple health conditions.   Jimmy was informed we would discuss her lab results at her next visit unless there is a critical issue that needs to be addressed sooner. Roberta [name] agreed to keep her next visit at the agreed upon time to discuss these results.  Objective:   Blood pressure 116/76, pulse 61, temperature 98.1 F (36.7 C), height 5\' 6"  (1.676 m), weight 257 lb (116.6 kg), SpO2 97 %. Body mass index is 41.48 kg/m.  General: Cooperative, alert, well developed, in no acute distress. HEENT: Conjunctivae and lids unremarkable. Cardiovascular: Regular rhythm.  Lungs: Normal work of breathing. Neurologic: No focal deficits.   Lab Results  Component Value Date   CREATININE WILL FOLLOW 04/19/2020   BUN WILL FOLLOW 04/19/2020   NA 142 04/19/2020   K WILL FOLLOW 04/19/2020   CL 105 04/19/2020   CO2 WILL FOLLOW 04/19/2020   Lab Results  Component Value Date   ALT WILL FOLLOW 04/19/2020   AST WILL FOLLOW 04/19/2020   ALKPHOS WILL FOLLOW 04/19/2020   BILITOT WILL FOLLOW 04/19/2020   Lab Results  Component Value Date   HGBA1C 5.8 (H) 04/19/2020   HGBA1C 5.9 (H) 12/12/2019   HGBA1C 6.0 (H) 12/11/2019   HGBA1C 5.9 (H) 08/07/2019   HGBA1C 6.4 (H) 01/27/2019   Lab Results  Component Value Date   INSULIN 6.6 04/19/2020   INSULIN 7.2 08/07/2019   INSULIN 13.4 01/31/2018   INSULIN 10.6 10/08/2017   Lab Results  Component Value Date   TSH 1.480 10/08/2017   Lab Results  Component Value Date   CHOL WILL FOLLOW 04/19/2020   HDL WILL FOLLOW 04/19/2020   LDLCALC WILL FOLLOW 04/19/2020   TRIG WILL FOLLOW 04/19/2020   CHOLHDL 2.0 12/12/2019   Lab Results  Component Value Date   WBC 4.6 03/23/2020   HGB 10.1 (L) 03/23/2020   HCT 32.1 (L) 03/23/2020   MCV 80.9 03/23/2020   PLT 221 03/23/2020   Lab Results  Component Value Date   IRON 162 (H) 01/22/2017    FERRITIN 14.5 01/22/2017     Attestation Statements:   Reviewed by clinician on day of visit: allergies, medications, problem list, medical history, surgical history, family history, social history, and previous encounter notes.  Coral Ceo, am acting as transcriptionist for Coralie Common, MD.   I have reviewed the above documentation for accuracy and completeness, and I agree with the above. - Jinny Blossom, MD

## 2020-04-22 ENCOUNTER — Encounter: Payer: Self-pay | Admitting: Internal Medicine

## 2020-05-13 ENCOUNTER — Ambulatory Visit (INDEPENDENT_AMBULATORY_CARE_PROVIDER_SITE_OTHER): Payer: BC Managed Care – PPO | Admitting: Family Medicine

## 2020-05-13 ENCOUNTER — Other Ambulatory Visit: Payer: Self-pay

## 2020-05-13 ENCOUNTER — Encounter (INDEPENDENT_AMBULATORY_CARE_PROVIDER_SITE_OTHER): Payer: Self-pay | Admitting: Family Medicine

## 2020-05-13 VITALS — BP 113/74 | HR 67 | Temp 97.7°F | Ht 66.0 in | Wt 258.0 lb

## 2020-05-13 DIAGNOSIS — Z9189 Other specified personal risk factors, not elsewhere classified: Secondary | ICD-10-CM | POA: Diagnosis not present

## 2020-05-13 DIAGNOSIS — Z9861 Coronary angioplasty status: Secondary | ICD-10-CM

## 2020-05-13 DIAGNOSIS — R7303 Prediabetes: Secondary | ICD-10-CM

## 2020-05-13 DIAGNOSIS — I2510 Atherosclerotic heart disease of native coronary artery without angina pectoris: Secondary | ICD-10-CM

## 2020-05-13 DIAGNOSIS — Z6841 Body Mass Index (BMI) 40.0 and over, adult: Secondary | ICD-10-CM

## 2020-05-13 MED ORDER — METFORMIN HCL 500 MG PO TABS
500.0000 mg | ORAL_TABLET | Freq: Every morning | ORAL | 0 refills | Status: DC
Start: 2020-05-13 — End: 2020-07-12

## 2020-05-17 NOTE — Progress Notes (Signed)
Chief Complaint:   OBESITY Roberta [name] is here to discuss her progress with her obesity treatment plan along with follow-up of her obesity related diagnoses. Roberta [name] is on the Category 3 Plan and states she is following her eating plan approximately 80% of the time. Roberta [name] states she is working with a Physiological scientist 30 minutes 2 times per week.  Today's visit was #: 53 Starting weight: 292 lbs Starting date: 10/08/2017 Today's weight: 258 lbs Today's date: 05/13/2020 Total lbs lost to date: 34 lbs Total lbs lost since last in-office visit: 0  Interim History: Roberta [name] feels category 4 is too much food to consume in a day and wants to go back to category 3. Her personal trainer quit. She is going to continue going to the gym as planned. She is trying to get through the next few weeks of work. Breakfast and lunch is easy in terms of food choices.  Subjective:   1. Pre-diabetes Last A1c 5.8 and insulin level 6.6. Roberta [name] is on Metformin 500 mg daily.  2. CAD S/P percutaneous coronary angioplasty Roberta [name] is on Lipitor. She has a history of NSTEMI. She sees cardiology (Dr. Stanford Breed). Pt did have an episode of chest pain yesterday. She is on Plavix.  3. At risk for diabetes mellitus Roberta [name] is at higher than average risk for developing diabetes due to obesity.   Assessment/Plan:   1. Pre-diabetes Roberta [name] will continue to work on weight loss, exercise, and decreasing simple carbohydrates to help decrease the risk of diabetes.   - metFORMIN (GLUCOPHAGE) 500 MG tablet; Take 1 tablet (500 mg total) by mouth every morning.  Dispense: 30 tablet; Refill: 0  2. CAD S/P percutaneous coronary angioplasty Pt is to follow up with cardiology to assess that 1 episode of chest pain that she had.  3. At risk for diabetes mellitus Roberta [name] was given approximately 15 minutes of diabetes education and counseling today. We discussed intensive lifestyle modifications today with an emphasis on weight loss as well as  increasing exercise and decreasing simple carbohydrates in her diet. We also reviewed medication options with an emphasis on risk versus benefit of those discussed.   Repetitive spaced learning was employed today to elicit superior memory formation and behavioral change.  4. Class 3 severe obesity with serious comorbidity and body mass index (BMI) of 50.0 to 59.9 in adult, unspecified obesity type (HCC)  Roberta [name] is currently in the action stage of change. As such, her goal is to continue with weight loss efforts. She has agreed to the Category 3 Plan.   Exercise goals: As is  Behavioral modification strategies: increasing lean protein intake, meal planning and cooking strategies, keeping healthy foods in the home and planning for success.  Roberta [name] has agreed to follow-up with our clinic in 2-3 weeks. She was informed of the importance of frequent follow-up visits to maximize her success with intensive lifestyle modifications for her multiple health conditions.   Objective:   Blood pressure 113/74, pulse 67, temperature 97.7 F (36.5 C), height 5\' 6"  (1.676 m), weight 258 lb (117 kg), SpO2 99 %. Body mass index is 41.64 kg/m.  General: Cooperative, alert, well developed, in no acute distress. HEENT: Conjunctivae and lids unremarkable. Cardiovascular: Regular rhythm.  Lungs: Normal work of breathing. Neurologic: No focal deficits.   Lab Results  Component Value Date   CREATININE 0.89 04/19/2020   BUN 23 04/19/2020   NA 142 04/19/2020   K 4.4 04/19/2020   CL 105 04/19/2020   CO2  22 04/19/2020   Lab Results  Component Value Date   ALT 24 04/19/2020   AST 28 04/19/2020   ALKPHOS 70 04/19/2020   BILITOT <0.2 04/19/2020   Lab Results  Component Value Date   HGBA1C 5.8 (H) 04/19/2020   HGBA1C 5.9 (H) 12/12/2019   HGBA1C 6.0 (H) 12/11/2019   HGBA1C 5.9 (H) 08/07/2019   HGBA1C 6.4 (H) 01/27/2019   Lab Results  Component Value Date   INSULIN 6.6 04/19/2020   INSULIN 7.2  08/07/2019   INSULIN 13.4 01/31/2018   INSULIN 10.6 10/08/2017   Lab Results  Component Value Date   TSH 1.480 10/08/2017   Lab Results  Component Value Date   CHOL 117 04/19/2020   HDL 72 04/19/2020   LDLCALC 35 04/19/2020   TRIG 33 04/19/2020   CHOLHDL 2.0 12/12/2019   Lab Results  Component Value Date   WBC 4.6 03/23/2020   HGB 10.1 (L) 03/23/2020   HCT 32.1 (L) 03/23/2020   MCV 80.9 03/23/2020   PLT 221 03/23/2020   Lab Results  Component Value Date   IRON 162 (H) 01/22/2017   FERRITIN 14.5 01/22/2017     Attestation Statements:   Reviewed by clinician on day of visit: allergies, medications, problem list, medical history, surgical history, family history, social history, and previous encounter notes.  Coral Ceo, CMA, am acting as transcriptionist for Coralie Common, MD.   I have reviewed the above documentation for accuracy and completeness, and I agree with the above. - Jinny Blossom, MD

## 2020-05-24 ENCOUNTER — Other Ambulatory Visit (INDEPENDENT_AMBULATORY_CARE_PROVIDER_SITE_OTHER): Payer: Self-pay | Admitting: Family Medicine

## 2020-05-24 DIAGNOSIS — R7303 Prediabetes: Secondary | ICD-10-CM

## 2020-06-05 ENCOUNTER — Other Ambulatory Visit: Payer: Self-pay | Admitting: Interventional Cardiology

## 2020-06-08 ENCOUNTER — Other Ambulatory Visit: Payer: Self-pay | Admitting: General Practice

## 2020-06-08 ENCOUNTER — Other Ambulatory Visit (INDEPENDENT_AMBULATORY_CARE_PROVIDER_SITE_OTHER): Payer: Self-pay | Admitting: Family Medicine

## 2020-06-08 DIAGNOSIS — R7303 Prediabetes: Secondary | ICD-10-CM

## 2020-06-08 NOTE — Telephone Encounter (Signed)
Dr.Ukleja 

## 2020-06-09 LAB — HM DIABETES EYE EXAM

## 2020-06-12 ENCOUNTER — Encounter (INDEPENDENT_AMBULATORY_CARE_PROVIDER_SITE_OTHER): Payer: Self-pay

## 2020-06-14 ENCOUNTER — Encounter (INDEPENDENT_AMBULATORY_CARE_PROVIDER_SITE_OTHER): Payer: Self-pay

## 2020-06-14 ENCOUNTER — Ambulatory Visit (INDEPENDENT_AMBULATORY_CARE_PROVIDER_SITE_OTHER): Payer: BC Managed Care – PPO | Admitting: Family Medicine

## 2020-06-17 ENCOUNTER — Other Ambulatory Visit: Payer: Self-pay | Admitting: Medical

## 2020-06-22 ENCOUNTER — Other Ambulatory Visit: Payer: Self-pay

## 2020-06-22 ENCOUNTER — Ambulatory Visit: Payer: BC Managed Care – PPO | Admitting: Internal Medicine

## 2020-06-22 ENCOUNTER — Encounter: Payer: Self-pay | Admitting: Internal Medicine

## 2020-06-22 DIAGNOSIS — R109 Unspecified abdominal pain: Secondary | ICD-10-CM | POA: Diagnosis not present

## 2020-06-22 DIAGNOSIS — Z9189 Other specified personal risk factors, not elsewhere classified: Secondary | ICD-10-CM | POA: Diagnosis not present

## 2020-06-22 DIAGNOSIS — I2510 Atherosclerotic heart disease of native coronary artery without angina pectoris: Secondary | ICD-10-CM | POA: Diagnosis not present

## 2020-06-22 DIAGNOSIS — D509 Iron deficiency anemia, unspecified: Secondary | ICD-10-CM | POA: Diagnosis not present

## 2020-06-22 DIAGNOSIS — Z9861 Coronary angioplasty status: Secondary | ICD-10-CM

## 2020-06-22 DIAGNOSIS — R10A0 Flank pain, unspecified side: Secondary | ICD-10-CM

## 2020-06-22 LAB — URINALYSIS
Bilirubin Urine: NEGATIVE
Hgb urine dipstick: NEGATIVE
Ketones, ur: NEGATIVE
Leukocytes,Ua: NEGATIVE
Nitrite: NEGATIVE
Specific Gravity, Urine: 1.01 (ref 1.000–1.030)
Total Protein, Urine: NEGATIVE
Urine Glucose: NEGATIVE
Urobilinogen, UA: 0.2 (ref 0.0–1.0)
pH: 6 (ref 5.0–8.0)

## 2020-06-22 LAB — COMPREHENSIVE METABOLIC PANEL
ALT: 25 U/L (ref 0–35)
AST: 24 U/L (ref 0–37)
Albumin: 3.4 g/dL — ABNORMAL LOW (ref 3.5–5.2)
Alkaline Phosphatase: 75 U/L (ref 39–117)
BUN: 20 mg/dL (ref 6–23)
CO2: 30 mEq/L (ref 19–32)
Calcium: 8.5 mg/dL (ref 8.4–10.5)
Chloride: 101 mEq/L (ref 96–112)
Creatinine, Ser: 1.03 mg/dL (ref 0.40–1.20)
GFR: 60.46 mL/min (ref >60.00)
Glucose, Bld: 106 mg/dL — ABNORMAL HIGH (ref 70–99)
Potassium: 3.8 mEq/L (ref 3.5–5.1)
Sodium: 137 mEq/L (ref 135–145)
Total Bilirubin: 0.3 mg/dL (ref 0.2–1.2)
Total Protein: 6.9 g/dL (ref 6.0–8.3)

## 2020-06-22 LAB — CBC WITH DIFFERENTIAL/PLATELET
Basophils Absolute: 0 10*3/uL (ref 0.0–0.1)
Basophils Relative: 1 % (ref 0.0–3.0)
Eosinophils Absolute: 0.1 10*3/uL (ref 0.0–0.7)
Eosinophils Relative: 3.2 % (ref 0.0–5.0)
HCT: 29 % — ABNORMAL LOW (ref 36.0–46.0)
Hemoglobin: 9.5 g/dL — ABNORMAL LOW (ref 12.0–15.0)
Lymphocytes Relative: 35.2 % (ref 12.0–46.0)
Lymphs Abs: 1.6 10*3/uL (ref 0.7–4.0)
MCHC: 32.7 g/dL (ref 30.0–36.0)
MCV: 75.6 fl — ABNORMAL LOW (ref 78.0–100.0)
Monocytes Absolute: 0.5 10*3/uL (ref 0.1–1.0)
Monocytes Relative: 11.7 % (ref 3.0–12.0)
Neutro Abs: 2.2 10*3/uL (ref 1.4–7.7)
Neutrophils Relative %: 48.9 % (ref 43.0–77.0)
Platelets: 199 10*3/uL (ref 150.0–400.0)
RBC: 3.83 Mil/uL — ABNORMAL LOW (ref 3.87–5.11)
RDW: 20 % — ABNORMAL HIGH (ref 11.5–15.5)
WBC: 4.6 10*3/uL (ref 4.0–10.5)

## 2020-06-22 LAB — SEDIMENTATION RATE: Sed Rate: 47 mm/hr — ABNORMAL HIGH (ref 0–30)

## 2020-06-22 MED ORDER — CYCLOBENZAPRINE HCL 5 MG PO TABS: 5.0000 mg | ORAL_TABLET | Freq: Three times a day (TID) | ORAL | 1 refills | Status: DC | PRN

## 2020-06-22 MED ORDER — HYDROCODONE-ACETAMINOPHEN 5-325 MG PO TABS: 0.5000 | ORAL_TABLET | ORAL | 0 refills | Status: DC | PRN

## 2020-06-22 NOTE — Assessment & Plan Note (Addendum)
New.  Probably MSK strain versus other.  The patient has a history of cholecystectomy.  Her appendix is in. Will obtain CBC, ESR, UA CT abd if not well tomorrow Flexeril prn.  Norco as needed severe pain Call if rash appeared-will treat with Valtrex

## 2020-06-22 NOTE — Addendum Note (Signed)
Addended by: Boris Lown B on: 06/22/2020 03:23 PM   Modules accepted: Orders

## 2020-06-22 NOTE — Assessment & Plan Note (Signed)
No angina 

## 2020-06-22 NOTE — Assessment & Plan Note (Signed)
Will obtain glucose

## 2020-06-22 NOTE — Progress Notes (Addendum)
Subjective:  Patient ID: Roberta [name], female    DOB: November 10, 1965  Age: 55 y.o. MRN: [id number]  CC: Spasms ((R) side)   HPI Roberta [name] presents for R side pain - spasms off and on since Mon night.  On Sat the patient worked in the yard all day.    Pain in the RLQ is dull 4-5/10, deep inside - worse w/ROM, getting up.  Better with rest.  There is no nausea, vomiting, diarrhea, chills, fever, night sweats  Outpatient Medications Prior to Visit  Medication Sig Dispense Refill   anastrozole (ARIMIDEX) 1 MG tablet Take 1 tablet (1 mg total) by mouth daily. 90 tablet 4   aspirin EC 81 MG EC tablet Take 1 tablet (81 mg total) by mouth daily. Swallow whole. 30 tablet 11   atorvastatin (LIPITOR) 80 MG tablet Take 1 tablet (80 mg total) by mouth daily. 90 tablet 3   CINNAMON PO Take 2 capsules by mouth daily.     clopidogrel (PLAVIX) 75 MG tablet Take 1 tablet (75 mg total) by mouth daily. 90 tablet 3   gabapentin (NEURONTIN) 300 MG capsule Take 1 capsule (300 mg total) by mouth at bedtime. 90 capsule 4   hydrochlorothiazide (MICROZIDE) 12.5 MG capsule TAKE 1 CAPSULE BY MOUTH EVERY DAY 90 capsule 1   metFORMIN (GLUCOPHAGE) 500 MG tablet Take 1 tablet (500 mg total) by mouth every morning. 30 tablet 0   metoprolol tartrate (LOPRESSOR) 25 MG tablet Take 0.5 tablets (12.5 mg total) by mouth 2 (two) times daily. 180 tablet 2   Multiple Vitamin (MULTIVITAMIN) tablet Take 1 tablet by mouth daily.     nitroGLYCERIN (NITROSTAT) 0.4 MG SL tablet Place 1 tablet (0.4 mg total) under the tongue every 5 (five) minutes x 3 doses as needed for chest pain. 25 tablet 5   ranolazine (RANEXA) 500 MG 12 hr tablet TAKE 1 TABLET BY MOUTH TWICE A DAY 180 tablet 1   venlafaxine XR (EFFEXOR-XR) 75 MG 24 hr capsule Take 1 capsule (75 mg total) by mouth daily with breakfast. 90 capsule 4   No facility-administered medications prior to visit.    ROS: Review of Systems  Constitutional:  Negative for  activity change, appetite change, chills, diaphoresis, fatigue, fever and unexpected weight change.  HENT:  Negative for congestion, mouth sores and sinus pressure.   Eyes:  Negative for visual disturbance.  Respiratory:  Negative for cough and chest tightness.   Gastrointestinal:  Positive for abdominal pain. Negative for abdominal distention, anal bleeding, blood in stool, constipation, diarrhea, nausea, rectal pain and vomiting.  Genitourinary:  Negative for difficulty urinating, frequency and vaginal pain.  Musculoskeletal:  Positive for back pain. Negative for gait problem.  Skin:  Negative for pallor and rash.  Neurological:  Negative for dizziness, tremors, weakness, numbness and headaches.  Psychiatric/Behavioral:  Negative for confusion and sleep disturbance. The patient is not nervous/anxious.    Objective:  BP 121/72 (BP Location: Left Arm)   Pulse 70   Temp 98.5 F (36.9 C) (Oral)   Ht 5\' 6"  (1.676 m)   Wt 262 lb 9.6 oz (119.1 kg)   SpO2 98%   BMI 42.38 kg/m   BP Readings from Last 3 Encounters:  06/22/20 121/72  05/13/20 113/74  04/19/20 116/76    Wt Readings from Last 3 Encounters:  06/22/20 262 lb 9.6 oz (119.1 kg)  05/13/20 258 lb (117 kg)  04/19/20 257 lb (116.6 kg)    Physical Exam Constitutional:  General: She is not in acute distress.    Appearance: She is well-developed. She is obese. She is not ill-appearing, toxic-appearing or diaphoretic.  HENT:     Head: Normocephalic.     Right Ear: External ear normal.     Left Ear: External ear normal.     Nose: Nose normal.  Eyes:     General:        Right eye: No discharge.        Left eye: No discharge.     Conjunctiva/sclera: Conjunctivae normal.     Pupils: Pupils are equal, round, and reactive to light.  Neck:     Thyroid: No thyromegaly.     Vascular: No JVD.     Trachea: No tracheal deviation.  Cardiovascular:     Rate and Rhythm: Normal rate and regular rhythm.     Heart sounds: Normal  heart sounds.  Pulmonary:     Effort: No respiratory distress.     Breath sounds: No stridor. No wheezing.  Abdominal:     General: Bowel sounds are normal. There is no distension.     Palpations: Abdomen is soft. There is no mass.     Tenderness: There is abdominal tenderness. There is no guarding or rebound.  Musculoskeletal:        General: No tenderness.     Cervical back: Normal range of motion and neck supple. No rigidity.  Lymphadenopathy:     Cervical: No cervical adenopathy.  Skin:    Findings: No erythema or rash.  Neurological:     Cranial Nerves: No cranial nerve deficit.     Motor: No abnormal muscle tone.     Coordination: Coordination normal.     Gait: Gait abnormal.     Deep Tendon Reflexes: Reflexes normal.  Psychiatric:        Behavior: Behavior normal.        Thought Content: Thought content normal.        Judgment: Judgment normal.   Very uncomfortable w/ROM when she stood up No rebound  Lab Results  Component Value Date   WBC 4.6 03/23/2020   HGB 10.1 (L) 03/23/2020   HCT 32.1 (L) 03/23/2020   PLT 221 03/23/2020   GLUCOSE 79 04/19/2020   CHOL 117 04/19/2020   TRIG 33 04/19/2020   HDL 72 04/19/2020   LDLCALC 35 04/19/2020   ALT 24 04/19/2020   AST 28 04/19/2020   NA 142 04/19/2020   K 4.4 04/19/2020   CL 105 04/19/2020   CREATININE 0.89 04/19/2020   BUN 23 04/19/2020   CO2 22 04/19/2020   TSH 1.480 10/08/2017   HGBA1C 5.8 (H) 04/19/2020    No results found.  Assessment & Plan:    Follow-up: No follow-ups on file.  Walker Kehr, MD

## 2020-06-22 NOTE — Patient Instructions (Signed)
Use ice, heat

## 2020-06-22 NOTE — Assessment & Plan Note (Signed)
A little worse.  The patient will follow up with Dr. Quay Burow

## 2020-06-24 ENCOUNTER — Encounter: Payer: Self-pay | Admitting: Internal Medicine

## 2020-07-04 ENCOUNTER — Other Ambulatory Visit (INDEPENDENT_AMBULATORY_CARE_PROVIDER_SITE_OTHER): Payer: Self-pay | Admitting: Family Medicine

## 2020-07-04 DIAGNOSIS — R7303 Prediabetes: Secondary | ICD-10-CM

## 2020-07-06 NOTE — Telephone Encounter (Signed)
Last OV with Dr Ukleja 

## 2020-07-12 ENCOUNTER — Encounter: Payer: Self-pay | Admitting: Internal Medicine

## 2020-07-12 ENCOUNTER — Other Ambulatory Visit (INDEPENDENT_AMBULATORY_CARE_PROVIDER_SITE_OTHER): Payer: Self-pay | Admitting: Family Medicine

## 2020-07-12 ENCOUNTER — Other Ambulatory Visit: Payer: Self-pay

## 2020-07-12 DIAGNOSIS — R7303 Prediabetes: Secondary | ICD-10-CM

## 2020-07-12 MED ORDER — METFORMIN HCL 500 MG PO TABS: 500.0000 mg | ORAL_TABLET | Freq: Every morning | ORAL | 0 refills | Status: DC

## 2020-07-12 NOTE — Telephone Encounter (Signed)
Last OV with Dr Ukleja 

## 2020-07-27 ENCOUNTER — Encounter: Payer: Self-pay | Admitting: Internal Medicine

## 2020-07-28 ENCOUNTER — Ambulatory Visit
Admission: RE | Admit: 2020-07-28 | Discharge: 2020-07-28 | Disposition: A | Discharge status: Home / Self Care | Payer: BC Managed Care – PPO | Source: Ambulatory Visit | Attending: Obstetrics and Gynecology | Admitting: Obstetrics and Gynecology

## 2020-07-28 ENCOUNTER — Other Ambulatory Visit: Payer: Self-pay

## 2020-07-28 DIAGNOSIS — Z9012 Acquired absence of left breast and nipple: Secondary | ICD-10-CM

## 2020-07-28 DIAGNOSIS — Z853 Personal history of malignant neoplasm of breast: Secondary | ICD-10-CM

## 2020-07-28 DIAGNOSIS — Z1231 Encounter for screening mammogram for malignant neoplasm of breast: Secondary | ICD-10-CM

## 2020-07-30 ENCOUNTER — Encounter: Payer: Self-pay | Admitting: Oncology

## 2020-08-04 ENCOUNTER — Other Ambulatory Visit: Payer: Self-pay | Admitting: Internal Medicine

## 2020-08-04 DIAGNOSIS — R7303 Prediabetes: Secondary | ICD-10-CM

## 2020-09-01 ENCOUNTER — Other Ambulatory Visit: Payer: Self-pay | Admitting: Internal Medicine

## 2020-09-01 DIAGNOSIS — R7303 Prediabetes: Secondary | ICD-10-CM

## 2020-09-20 ENCOUNTER — Encounter: Payer: Self-pay | Admitting: Oncology

## 2020-09-22 MED ORDER — METOPROLOL TARTRATE 25 MG PO TABS: 12.5000 mg | ORAL_TABLET | Freq: Two times a day (BID) | ORAL | 0 refills | Status: DC

## 2020-09-30 ENCOUNTER — Other Ambulatory Visit: Payer: Self-pay

## 2020-09-30 ENCOUNTER — Other Ambulatory Visit: Payer: Self-pay | Admitting: Internal Medicine

## 2020-09-30 ENCOUNTER — Inpatient Hospital Stay: Payer: BC Managed Care – PPO | Attending: Oncology

## 2020-09-30 ENCOUNTER — Inpatient Hospital Stay: Payer: BC Managed Care – PPO | Admitting: Oncology

## 2020-09-30 VITALS — BP 134/70 | HR 68 | Temp 97.5°F | Resp 18 | Ht 66.0 in | Wt 269.9 lb

## 2020-09-30 DIAGNOSIS — Z7984 Long term (current) use of oral hypoglycemic drugs: Secondary | ICD-10-CM | POA: Insufficient documentation

## 2020-09-30 DIAGNOSIS — Z9012 Acquired absence of left breast and nipple: Secondary | ICD-10-CM | POA: Insufficient documentation

## 2020-09-30 DIAGNOSIS — Z7982 Long term (current) use of aspirin: Secondary | ICD-10-CM | POA: Diagnosis not present

## 2020-09-30 DIAGNOSIS — C50212 Malignant neoplasm of upper-inner quadrant of left female breast: Secondary | ICD-10-CM | POA: Diagnosis not present

## 2020-09-30 DIAGNOSIS — Z170 Estrogen receptor positive status [ER+]: Secondary | ICD-10-CM

## 2020-09-30 DIAGNOSIS — I10 Essential (primary) hypertension: Secondary | ICD-10-CM | POA: Insufficient documentation

## 2020-09-30 DIAGNOSIS — R232 Flushing: Secondary | ICD-10-CM | POA: Diagnosis not present

## 2020-09-30 DIAGNOSIS — Z6841 Body Mass Index (BMI) 40.0 and over, adult: Secondary | ICD-10-CM | POA: Diagnosis not present

## 2020-09-30 DIAGNOSIS — Z801 Family history of malignant neoplasm of trachea, bronchus and lung: Secondary | ICD-10-CM | POA: Insufficient documentation

## 2020-09-30 DIAGNOSIS — Z79899 Other long term (current) drug therapy: Secondary | ICD-10-CM | POA: Diagnosis not present

## 2020-09-30 DIAGNOSIS — Z9884 Bariatric surgery status: Secondary | ICD-10-CM | POA: Insufficient documentation

## 2020-09-30 DIAGNOSIS — Z79811 Long term (current) use of aromatase inhibitors: Secondary | ICD-10-CM | POA: Diagnosis not present

## 2020-09-30 DIAGNOSIS — R7303 Prediabetes: Secondary | ICD-10-CM

## 2020-09-30 LAB — COMPREHENSIVE METABOLIC PANEL
ALT: 23 U/L (ref 0–44)
AST: 24 U/L (ref 15–41)
Albumin: 3.2 g/dL — ABNORMAL LOW (ref 3.5–5.0)
Alkaline Phosphatase: 88 U/L (ref 38–126)
Anion gap: 9 (ref 5–15)
BUN: 20 mg/dL (ref 6–20)
CO2: 25 mmol/L (ref 22–32)
Calcium: 8.8 mg/dL — ABNORMAL LOW (ref 8.9–10.3)
Chloride: 106 mmol/L (ref 98–111)
Creatinine, Ser: 0.95 mg/dL (ref 0.44–1.00)
GFR, Estimated: >60 mL/min (ref >60)
Glucose, Bld: 72 mg/dL (ref 70–99)
Potassium: 3.8 mmol/L (ref 3.5–5.1)
Sodium: 140 mmol/L (ref 135–145)
Total Bilirubin: 0.5 mg/dL (ref 0.3–1.2)
Total Protein: 7.1 g/dL (ref 6.5–8.1)

## 2020-09-30 LAB — CBC WITH DIFFERENTIAL/PLATELET
Abs Immature Granulocytes: 0 10*3/uL (ref 0.00–0.07)
Basophils Absolute: 0 10*3/uL (ref 0.0–0.1)
Basophils Relative: 1 %
Eosinophils Absolute: 0.4 10*3/uL (ref 0.0–0.5)
Eosinophils Relative: 10 %
HCT: 30.5 % — ABNORMAL LOW (ref 36.0–46.0)
Hemoglobin: 9.5 g/dL — ABNORMAL LOW (ref 12.0–15.0)
Immature Granulocytes: 0 %
Lymphocytes Relative: 30 %
Lymphs Abs: 1.2 10*3/uL (ref 0.7–4.0)
MCH: 23.6 pg — ABNORMAL LOW (ref 26.0–34.0)
MCHC: 31.1 g/dL (ref 30.0–36.0)
MCV: 75.9 fL — ABNORMAL LOW (ref 80.0–100.0)
Monocytes Absolute: 0.5 10*3/uL (ref 0.1–1.0)
Monocytes Relative: 13 %
Neutro Abs: 1.9 10*3/uL (ref 1.7–7.7)
Neutrophils Relative %: 46 %
Platelets: 212 10*3/uL (ref 150–400)
RBC: 4.02 MIL/uL (ref 3.87–5.11)
RDW: 19.6 % — ABNORMAL HIGH (ref 11.5–15.5)
WBC: 4 10*3/uL (ref 4.0–10.5)
nRBC: 0 % (ref 0.0–0.2)

## 2020-09-30 MED ORDER — VENLAFAXINE HCL ER 75 MG PO CP24: 75.0000 mg | ORAL_CAPSULE | Freq: Every day | ORAL | 4 refills | Status: DC

## 2020-09-30 MED ORDER — ANASTROZOLE 1 MG PO TABS: 1.0000 mg | ORAL_TABLET | Freq: Every day | ORAL | 4 refills | Status: DC

## 2020-09-30 NOTE — Progress Notes (Signed)
[facility]  [phone] [phone]    ID: Roberta [name] DOB: December 27, 1965  MR#: [id number]  VVO#:[id number]  Patient Care Team: [name], MD as PCP - General (Internal Medicine) Stanford Breed Denice Bors, MD as PCP - Cardiology (Cardiology) Daved Mcfann, Virgie Dad, MD as Consulting Physician (Oncology) Kyung Rudd, MD as Consulting Physician (Radiation Oncology) Juanita Craver, MD as Consulting Physician (Gastroenterology) Sherlyn Hay, DO as Consulting Physician (Obstetrics and Gynecology) OTHER MD:   CHIEF COMPLAINT: Estrogen receptor positive breast cancer (s/p left mastectomy)  CURRENT TREATMENT: Anastrozole   INTERVAL HISTORY: [name] returns today for follow up of her estrogen receptor positive breast cancer.  She was switched to anastrozole at her last visit on 03/23/2020.  She is tolerating this well.  She tells me her hot flashes are less frequent and she has no arthralgias or myalgias associated with this.  Since her last visit, she underwent right screening mammography with tomography at [facility] on 07/28/2020 showing: breast density category B; no evidence of malignancy.     REVIEW OF SYSTEMS: [name] is considering reconstruction on the left and is going to be meeting with Dr. Graylon Good in [place] regarding that.  She has problems with insomnia.  Her schedule this year is harder and she feels more tired.  She has less time for herself and no time to exercise.  Aside from these issues a detailed review of systems today was stable   COVID 19 VACCINATION STATUS: Status post Pfizer x4 as of September 2022   HISTORY OF CURRENT ILLNESS: From the original intake note:  Roberta [name] had routine screening mammography on 07/25/2018 which was normal.  Less than a month later she was found by Dr. Terri Piedra to have palpable lumps in the superior left breast at a clinic exam.  Kennidee underwent unilateral left diagnostic mammography with  tomography and left breast ultrasonography at [facility] on 08/23/2018 showing: Breast Density Category C. No suspicious masses are seen on the tomosynthesis images of the left breast. There are 2 groups of indeterminate calcifications however, one in the superior anterior superficial left breast which spans 8 mm. In the upper inner left breast, there is a 2.2 cm area of loosely grouped amorphous calcifications. Normal fibroglandular tissue in the upper inner and upper outer quadrants of the left breast.  There were no sonographic abnormalities to correspond with the masses identified on the clinical breast exam.  Accordingly on 08/29/2018 she proceeded to stereotactic biopsy of the left breast area in question. The pathology from this procedure showed ([id number]): invasive mammary carcinoma, nottingham grade 2 of 3. Prognostic indicators significant for: estrogen receptor, 100% positive and progesterone receptor, 2% positive, both with strong staining intensity. Proliferation marker Ki67 at 10%. HER2 negative (0+) by immunohistochemistry.  An additional biopsy of the left breast was performed on the same day. The pathology from this procedure showed ([id number]): ductal carcinoma in situ. Prognostic indicators significant for: estrogen receptor, 95% positie and progesterone receptor, 100% positive, both with strong staining intensity.  Finally, she underwent a left axillary ultrasound on 09/02/2018 showing: No left axillary adenopathy.  The patient's subsequent history is as detailed below.   PAST MEDICAL HISTORY: Past Medical History:  Diagnosis Date   Back pain    Cancer ([place])    multifocal left breast   Depression    Hip pain    Hypertension    Obesity    PONV (postoperative nausea and vomiting)    Prediabetes  Swelling    bilat LE   Wears glasses     PAST SURGICAL HISTORY: Past Surgical History:  Procedure Laterality Date   CHOLECYSTECTOMY     COLONOSCOPY     CORONARY  BALLOON ANGIOPLASTY N/A 12/11/2019   Procedure: CORONARY BALLOON ANGIOPLASTY;  Surgeon: [name], MD;  Location: [facility];  Service: Cardiovascular;  Laterality: N/A;   DILATION AND CURETTAGE OF UTERUS     GASTRIC BYPASS     KNEE SURGERY  2002   Ligament repair   LEFT HEART CATH AND CORONARY ANGIOGRAPHY N/A 12/11/2019   Procedure: LEFT HEART CATH AND CORONARY ANGIOGRAPHY;  Surgeon: [name], MD;  Location: [facility];  Service: Cardiovascular;  Laterality: N/A;   MASTECTOMY Left 2019   MASTECTOMY W/ SENTINEL NODE BIOPSY Left 10/01/2018   Procedure: LEFT TOTAL MASTECTOMY WITH SENTINEL LYMPH NODE BIOPSY AND BLUE DYE INJECTION;  Surgeon: Fanny Skates, MD;  Location: [facility];  Service: General;  Laterality: Left;    FAMILY HISTORY: Family History  Problem Relation Age of Onset   Diabetes Mother    Hypertension Mother    Heart disease Mother    Stroke Mother    Kidney disease Mother    Obesity Mother    Cancer Maternal Aunt        lung   Cancer Maternal Uncle        lung   Throat cancer Maternal Aunt   Greta's father died in his mid 67s from alcoholism.  The patient's mother died at age 71 from end-stage renal disease.  The patient had 2 brothers, 3 sisters, one brother dying from complications of diabetes.  There is no cancer in the immediate family.  There are 2 maternal uncles and 2 maternal aunts who died from lung cancer, all of whom smoked.  A maternal aunt also had a history of throat cancer.   GYNECOLOGIC HISTORY:  No LMP recorded. (Menstrual status: Perimenopausal). 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  She is divorced, lives alone.   ADVANCED DIRECTIVES: She intends to name her  sister [name] as healthcare power of attorney.  [name] can be reached at [phone].   HEALTH MAINTENANCE: Social History   Tobacco Use   Smoking status: Never   Smokeless tobacco: Never  Vaping Use   Vaping Use: Never used  Substance Use Topics   Alcohol use: Yes    Comment: occasional wine   Drug use: No    Colonoscopy: August 2020  PAP: Up-to-date  Bone density: 2005   Allergies  Allergen Reactions   Lisinopril Swelling    Swelling of the tongue   Penicillins     UNSPECIFIED CHILDHOOD REACTION   Imdur [Isosorbide Nitrate] Other (See Comments)    headache   Norvasc [Amlodipine Besylate] Rash   Red Dye Rash   Tetanus Toxoids Swelling    Swelling at site of injection    Current Outpatient Medications  Medication Sig Dispense Refill   anastrozole (ARIMIDEX) 1 MG tablet Take 1 tablet (1 mg total) by mouth daily. 90 tablet 4   aspirin EC 81 MG EC tablet Take 1 tablet (81 mg total) by mouth daily. Swallow whole. 30 tablet 11   atorvastatin (LIPITOR) 80  MG tablet Take 1 tablet (80 mg total) by mouth daily. 90 tablet 3   CINNAMON PO Take 2 capsules by mouth daily.     clopidogrel (PLAVIX) 75 MG tablet Take 1 tablet (75 mg total) by mouth daily. 90 tablet 3   cyclobenzaprine (FLEXERIL) 5 MG tablet Take 1 tablet (5 mg total) by mouth 3 (three) times daily as needed for muscle spasms. 30 tablet 1   gabapentin (NEURONTIN) 300 MG capsule Take 1 capsule (300 mg total) by mouth at bedtime. 90 capsule 4   hydrochlorothiazide (MICROZIDE) 12.5 MG capsule TAKE 1 CAPSULE BY MOUTH EVERY DAY 90 capsule 1   HYDROcodone-acetaminophen (NORCO/VICODIN) 5-325 MG tablet Take 0.5-1 tablets by mouth every 4 (four) hours as needed for severe pain. 20 tablet 0   metFORMIN (GLUCOPHAGE) 500 MG tablet TAKE 1 TABLET BY MOUTH EVERY DAY IN THE MORNING 30 tablet 0   metoprolol tartrate (LOPRESSOR) 25 MG tablet Take 0.5 tablets (12.5 mg total) by mouth 2 (two) times daily. 180 tablet 0   Multiple  Vitamin (MULTIVITAMIN) tablet Take 1 tablet by mouth daily.     nitroGLYCERIN (NITROSTAT) 0.4 MG SL tablet Place 1 tablet (0.4 mg total) under the tongue every 5 (five) minutes x 3 doses as needed for chest pain. 25 tablet 5   ranolazine (RANEXA) 500 MG 12 hr tablet TAKE 1 TABLET BY MOUTH TWICE A DAY 180 tablet 1   venlafaxine XR (EFFEXOR-XR) 75 MG 24 hr capsule Take 1 capsule (75 mg total) by mouth daily with breakfast. 90 capsule 4   No current facility-administered medications for this visit.    OBJECTIVE: African-American woman in no acute distress  Vitals:   09/30/20 0931  BP: 134/70  Pulse: 68  Resp: 18  Temp: (!) 97.5 F (36.4 C)  SpO2: 100%    Wt Readings from Last 3 Encounters:  09/30/20 269 lb 14.4 oz (122.4 kg)  06/22/20 262 lb 9.6 oz (119.1 kg)  05/13/20 258 lb (117 kg)   Body mass index is 43.56 kg/m.    ECOG FS:1 - Symptomatic but completely ambulatory  Sclerae unicteric, EOMs intact Wearing a mask No cervical or supraclavicular adenopathy Lungs no rales or rhonchi Heart regular rate and rhythm Abd soft, nontender, positive bowel sounds MSK no focal spinal tenderness, no upper extremity lymphedema Neuro: nonfocal, well oriented, appropriate affect Breasts: The right breast is unremarkable.  The left breast is status post mastectomy.  There is no evidence of chest wall recurrence.  Both axillae are benign.   LAB RESULTS:  CMP     Component Value Date/Time   NA 140 09/30/2020 0857   NA 142 04/19/2020 0943   K 3.8 09/30/2020 0857   CL 106 09/30/2020 0857   CO2 25 09/30/2020 0857   GLUCOSE 72 09/30/2020 0857   BUN 20 09/30/2020 0857   BUN 23 04/19/2020 0943   CREATININE 0.95 09/30/2020 0857   CREATININE 1.13 (H) 03/23/2020 1438   CALCIUM 8.8 (L) 09/30/2020 0857   PROT 7.1 09/30/2020 0857   PROT 6.6 04/19/2020 0943   ALBUMIN 3.2 (L) 09/30/2020 0857   ALBUMIN 3.4 (L) 04/19/2020 0943   AST 24 09/30/2020 0857   AST 33 03/23/2020 1438   ALT 23  09/30/2020 0857   ALT 30 03/23/2020 1438   ALKPHOS 88 09/30/2020 0857   BILITOT 0.5 09/30/2020 0857   BILITOT <0.2 04/19/2020 0943   BILITOT 0.2 (L) 03/23/2020 1438   GFRNONAA >60 09/30/2020 0857   GFRNONAA 57 (L)  03/23/2020 1438   GFRAA >60 09/23/2019 1441   GFRAA >60 09/04/2018 0840    No results found for: TOTALPROTELP, ALBUMINELP, A1GS, A2GS, BETS, BETA2SER, GAMS, MSPIKE, SPEI  No results found for: KPAFRELGTCHN, LAMBDASER, KAPLAMBRATIO  Lab Results  Component Value Date   WBC 4.0 09/30/2020   NEUTROABS 1.9 09/30/2020   HGB 9.5 (L) 09/30/2020   HCT 30.5 (L) 09/30/2020   MCV 75.9 (L) 09/30/2020   PLT 212 09/30/2020    No results found for: LABCA2  No components found for: BWIOMB559  No results for input(s): INR in the last 168 hours.  No results found for: LABCA2  No results found for: RCB638  No results found for: GTX646  No results found for: OEH212  No results found for: CA2729  No components found for: HGQUANT  No results found for: CEA1 / No results found for: CEA1   No results found for: AFPTUMOR  No results found for: CHROMOGRNA  No results found for: HGBA, HGBA2QUANT, HGBFQUANT, HGBSQUAN (Hemoglobinopathy evaluation)   No results found for: LDH  Lab Results  Component Value Date   IRON 162 (H) 01/22/2017   (Iron and TIBC)  Lab Results  Component Value Date   FERRITIN 14.5 01/22/2017    Urinalysis    Component Value Date/Time   COLORURINE YELLOW 06/22/2020 1524   APPEARANCEUR CLEAR 06/22/2020 1524   LABSPEC 1.010 06/22/2020 1524   PHURINE 6.0 06/22/2020 1524   GLUCOSEU NEGATIVE 06/22/2020 1524   HGBUR NEGATIVE 06/22/2020 1524   BILIRUBINUR NEGATIVE 06/22/2020 1524   KETONESUR NEGATIVE 06/22/2020 1524   PROTEINUR 100 (A) 06/25/2007 0640   UROBILINOGEN 0.2 06/22/2020 1524   NITRITE NEGATIVE 06/22/2020 [place] 06/22/2020 1524    STUDIES:  No results found.   ELIGIBLE FOR AVAILABLE RESEARCH PROTOCOL:  no   ASSESSMENT: 55 y.o. [place], [place] woman status post left breast upper inner quadrant biopsy for a clinical T2 N0, stage IB invasive ductal carcinoma, grade 2, estrogen and progesterone receptor positive, HER-2 not amplified, with an MIB-1-1 of 2%  (a) a second biopsy same day different quadrant showed ductal carcinoma in situ  (1) left breast s/p mastectomy on 10/01/2018 for a  pT1b pN0, stage IA invasive ductal carcinoma, grade 2, margins negative.  (a) a total of 5 lymph nodes biopsied were negative.    (2) Oncotype score of 20 predicts a risk of recurrence outside the breast within the next 9 years of 6% if her only systemic therapy is antiestrogens for 5 years.  Also predicts no benefit from chemotherapy.  (3) adjuvant radiation not indicated  (4)  tamoxifen started November 2020  (a) switched to anastrozole beginning March 2022 following NSTEMI and angioplasty December 2021   PLAN: Alexiana is now 2 years out from definitive surgery for her breast cancer with no evidence of disease recurrence.  This is very favorable.  She is tolerating anastrozole well and the plan will be to continue antiestrogens for a total of 5 years.  We discussed her initial thoughts regarding reconstruction of the left.  In the meantime she does have an adequate bras and prosthesis supplies and received a prescription for this from Dr. Ninfa Linden.  I have encouraged her to make time for exercise on a regular basis  Her hemoglobin and MCV are both down a bit.  I suspect she has some gastritis secondary to her aspirin.  I suggested she take an oral iron pill daily to correct this.  If she does not  tolerate that she will let me know and we will set her up for Venofer.  She will otherwise return to see [place] again in 1 year.  Total encounter time 20 minutes.Sarajane Jews C. Frankie Scipio, MD  09/30/20 9:44 AM Medical Oncology and Hematology [facility] [place], [place] [address] Tel.  [phone]    Fax. [phone]    I, Wilburn Mylar, am acting as scribe for Dr. Virgie Dad. Tykiera Raven.  I, Lurline Del MD, have reviewed the above documentation for accuracy and completeness, and I agree with the above.   *Total Encounter Time as defined by the Centers for Medicare and Medicaid Services includes, in addition to the face-to-face time of a patient visit (documented in the note above) non-face-to-face time: obtaining and reviewing outside history, ordering and reviewing medications, tests or procedures, care coordination (communications with other health care professionals or caregivers) and documentation in the medical record.

## 2020-10-07 ENCOUNTER — Other Ambulatory Visit: Payer: Self-pay | Admitting: General Practice

## 2020-10-26 ENCOUNTER — Other Ambulatory Visit: Payer: Self-pay | Admitting: Internal Medicine

## 2020-10-26 DIAGNOSIS — R7303 Prediabetes: Secondary | ICD-10-CM

## 2020-11-09 ENCOUNTER — Ambulatory Visit: Payer: BC Managed Care – PPO | Admitting: Internal Medicine

## 2020-11-09 ENCOUNTER — Encounter: Payer: Self-pay | Admitting: Internal Medicine

## 2020-11-09 ENCOUNTER — Other Ambulatory Visit: Payer: Self-pay

## 2020-11-09 VITALS — BP 112/70 | HR 77 | Temp 98.5°F | Ht 66.0 in | Wt 266.0 lb

## 2020-11-09 DIAGNOSIS — E559 Vitamin D deficiency, unspecified: Secondary | ICD-10-CM

## 2020-11-09 DIAGNOSIS — I10 Essential (primary) hypertension: Secondary | ICD-10-CM

## 2020-11-09 DIAGNOSIS — E538 Deficiency of other specified B group vitamins: Secondary | ICD-10-CM | POA: Diagnosis not present

## 2020-11-09 DIAGNOSIS — I2510 Atherosclerotic heart disease of native coronary artery without angina pectoris: Secondary | ICD-10-CM

## 2020-11-09 DIAGNOSIS — D509 Iron deficiency anemia, unspecified: Secondary | ICD-10-CM | POA: Diagnosis not present

## 2020-11-09 DIAGNOSIS — J069 Acute upper respiratory infection, unspecified: Secondary | ICD-10-CM | POA: Diagnosis not present

## 2020-11-09 DIAGNOSIS — Z9861 Coronary angioplasty status: Secondary | ICD-10-CM

## 2020-11-09 DIAGNOSIS — R7303 Prediabetes: Secondary | ICD-10-CM | POA: Diagnosis not present

## 2020-11-09 MED ORDER — AZITHROMYCIN 250 MG PO TABS: ORAL_TABLET | ORAL | 1 refills | Status: AC

## 2020-11-09 NOTE — Assessment & Plan Note (Signed)
With hx of low iron, for f/u lab - cbc and iron, ferritin

## 2020-11-09 NOTE — Patient Instructions (Signed)
Please take all new medication as prescribed - the antibiotic  The rapid covid testing is pending and you will be notified  Please continue all other medications as before, and refills have been done if requested.  Please have the pharmacy call with any other refills you may need.  Please continue your efforts at being more active, low cholesterol diet, and weight control  Please keep your appointments with your specialists as you may have planned  Please go to the LAB at the blood drawing area for the tests to be done  You will be contacted by phone if any changes need to be made immediately.  Otherwise, you will receive a letter about your results with an explanation, but please check with MyChart first.  Please make an Appointment to return in 6 months, or sooner if needed, to Dr Quay Burow

## 2020-11-09 NOTE — Assessment & Plan Note (Signed)
Mild to mod, for antibx course,  Check covid test at home and let us know, to f/u any worsening symptoms or concerns

## 2020-11-09 NOTE — Progress Notes (Signed)
Patient ID: Roberta [name], female   DOB: Jun 18, 1965, 55 y.o.   MRN: [id number]

## 2020-11-09 NOTE — Assessment & Plan Note (Signed)
Also for f/u lipids,  to f/u any worsening symptoms or concerns

## 2020-11-09 NOTE — Progress Notes (Signed)
Patient ID: Haylo Fake, female   DOB: Dec 31, 1965, 55 y.o.   MRN: [id number]        Chief Complaint: follow up HTN, HLD and hyperglycemia, anemia, and uri symptoms - pcp not available today       HPI:  Roberta [name] is a 55 y.o. female  Here with 4 days acute onset fever, facial pain, pressure, headache, general weakness and malaise, and greenish d/c, with mild ST and cough, but pt denies chest pain, wheezing, increased sob or doe, orthopnea, PND, increased LE swelling, palpitations, dizziness or syncope. Has not checked covid test at home but will do so later today.   Pt denies polydipsia, polyuria, or new focal neuro s/s.  No overt bleeding , taking iron supplement every other due due to tolerability.  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         Wt Readings from Last 3 Encounters:  11/09/20 266 lb (120.7 kg)  09/30/20 269 lb 14.4 oz (122.4 kg)  06/22/20 262 lb 9.6 oz (119.1 kg)   BP Readings from Last 3 Encounters:  11/09/20 112/70  09/30/20 134/70  06/22/20 121/72         Past Medical History:  Diagnosis Date   Back pain    Cancer ([place])    multifocal left breast   Depression    Hip pain    Hypertension    Obesity    PONV (postoperative nausea and vomiting)    Prediabetes    Swelling    bilat LE   Wears glasses    Past Surgical History:  Procedure Laterality Date   CHOLECYSTECTOMY     COLONOSCOPY     CORONARY BALLOON ANGIOPLASTY N/A 12/11/2019   Procedure: CORONARY BALLOON ANGIOPLASTY;  Surgeon: [name], MD;  Location: [facility];  Service: Cardiovascular;  Laterality: N/A;   DILATION AND CURETTAGE OF UTERUS     GASTRIC BYPASS     KNEE SURGERY  2002   Ligament repair   LEFT HEART CATH AND CORONARY ANGIOGRAPHY N/A 12/11/2019   Procedure: LEFT HEART CATH AND CORONARY ANGIOGRAPHY;  Surgeon: [name], MD;  Location: [facility];  Service: Cardiovascular;  Laterality: N/A;    MASTECTOMY Left 2019   MASTECTOMY W/ SENTINEL NODE BIOPSY Left 10/01/2018   Procedure: LEFT TOTAL MASTECTOMY WITH SENTINEL LYMPH NODE BIOPSY AND BLUE DYE INJECTION;  Surgeon: Fanny Skates, MD;  Location: [facility];  Service: General;  Laterality: Left;    reports that she has never smoked. She has never used smokeless tobacco. She reports current alcohol use. She reports that she does not use drugs. family history includes Cancer in her maternal aunt and maternal uncle; Diabetes in her mother; Heart disease in her mother; Hypertension in her mother; Kidney disease in her mother; Obesity in her mother; Stroke in her mother; Throat cancer in her maternal aunt. Allergies  Allergen Reactions   Lisinopril Swelling    Swelling of the tongue   Penicillins     UNSPECIFIED CHILDHOOD REACTION   Imdur [Isosorbide Nitrate] Other (See Comments)    headache   Norvasc [Amlodipine Besylate] Rash   Red Dye Rash   Tetanus Toxoids Swelling    Swelling at site of injection   Current Outpatient Medications on File Prior to Visit  Medication Sig Dispense Refill   anastrozole (ARIMIDEX) 1 MG tablet Take 1 tablet (1 mg total) by mouth daily.  90 tablet 4   aspirin EC 81 MG EC tablet Take 1 tablet (81 mg total) by mouth daily. Swallow whole. 30 tablet 11   atorvastatin (LIPITOR) 80 MG tablet Take 1 tablet (80 mg total) by mouth daily. 90 tablet 3   CINNAMON PO Take 2 capsules by mouth daily.     clopidogrel (PLAVIX) 75 MG tablet Take 1 tablet (75 mg total) by mouth daily. 90 tablet 3   cyclobenzaprine (FLEXERIL) 5 MG tablet Take 1 tablet (5 mg total) by mouth 3 (three) times daily as needed for muscle spasms. 30 tablet 1   gabapentin (NEURONTIN) 300 MG capsule Take 1 capsule (300 mg total) by mouth at bedtime. 90 capsule 4   hydrochlorothiazide (MICROZIDE) 12.5 MG capsule TAKE 1 CAPSULE BY MOUTH EVERY DAY 90 capsule 1   HYDROcodone-acetaminophen (NORCO/VICODIN) 5-325 MG tablet Take 0.5-1 tablets by mouth every 4  (four) hours as needed for severe pain. 20 tablet 0   metFORMIN (GLUCOPHAGE) 500 MG tablet TAKE 1 TABLET BY MOUTH EVERY DAY IN THE MORNING 30 tablet 0   metoprolol tartrate (LOPRESSOR) 25 MG tablet Take 0.5 tablets (12.5 mg total) by mouth 2 (two) times daily. 180 tablet 0   Multiple Vitamin (MULTIVITAMIN) tablet Take 1 tablet by mouth daily.     nitroGLYCERIN (NITROSTAT) 0.4 MG SL tablet Place 1 tablet (0.4 mg total) under the tongue every 5 (five) minutes x 3 doses as needed for chest pain. 25 tablet 5   ranolazine (RANEXA) 500 MG 12 hr tablet TAKE 1 TABLET BY MOUTH TWICE A DAY 180 tablet 0   venlafaxine XR (EFFEXOR-XR) 75 MG 24 hr capsule Take 1 capsule (75 mg total) by mouth daily with breakfast. 90 capsule 4   No current facility-administered medications on file prior to visit.        ROS:  All others reviewed and negative.  Objective        PE:  BP 112/70 (BP Location: Right Arm, Patient Position: Sitting, Cuff Size: Large)   Pulse 77   Temp 98.5 F (36.9 C) (Oral)   Ht 5\' 6"  (1.676 m)   Wt 266 lb (120.7 kg)   SpO2 98%   BMI 42.93 kg/m                 Constitutional: Pt appears in NAD               HENT: Head: NCAT.                Right Ear: External ear normal.                 Left Ear: External ear normal. Bilat tm's with mild erythema.  Max sinus areas mild tender.  Pharynx with mild erythema, no exudate               Eyes: . Pupils are equal, round, and reactive to light. Conjunctivae and EOM are normal               Nose: without d/c or deformity               Neck: Neck supple. Gross normal ROM               Cardiovascular: Normal rate and regular rhythm.                 Pulmonary/Chest: Effort normal and breath sounds without rales or wheezing.  Abd:  Soft, NT, ND, + BS, no organomegaly               Neurological: Pt is alert. At baseline orientation, motor grossly intact               Skin: Skin is warm. No rashes, no other new lesions, LE edema - none                Psychiatric: Pt behavior is normal without agitation   Micro: none  Cardiac tracings I have personally interpreted today:  none  Pertinent Radiological findings (summarize): none   Lab Results  Component Value Date   WBC 4.0 09/30/2020   HGB 9.5 (L) 09/30/2020   HCT 30.5 (L) 09/30/2020   PLT 212 09/30/2020   GLUCOSE 72 09/30/2020   CHOL 117 04/19/2020   TRIG 33 04/19/2020   HDL 72 04/19/2020   LDLCALC 35 04/19/2020   ALT 23 09/30/2020   AST 24 09/30/2020   NA 140 09/30/2020   K 3.8 09/30/2020   CL 106 09/30/2020   CREATININE 0.95 09/30/2020   BUN 20 09/30/2020   CO2 25 09/30/2020   TSH 1.480 10/08/2017   HGBA1C 5.8 (H) 04/19/2020   Assessment/Plan:  Ashunti Schofield is a 55 y.o. Declined [7] female with  has a past medical history of Back pain, Cancer ([place]), Depression, Hip pain, Hypertension, Obesity, PONV (postoperative nausea and vomiting), Prediabetes, Swelling, and Wears glasses.  Hypertension BP Readings from Last 3 Encounters:  11/09/20 112/70  09/30/20 134/70  06/22/20 121/72   Stable, pt to continue medical treatment hct, lopressor   Prediabetes Lab Results  Component Value Date   HGBA1C 5.8 (H) 04/19/2020   Stable, pt to continue current medical treatment  - metformin   Microcytic anemia, mild With hx of low iron, for f/u lab - cbc and iron, ferritin  Vitamin D deficiency, taking vit D 5000 IU daily Last vitamin D Lab Results  Component Value Date   VD25OH 34.8 04/19/2020   Low normal, to start oral replacement   CAD S/P percutaneous coronary angioplasty Also for f/u lipids,  to f/u any worsening symptoms or concerns  URI (upper respiratory infection) Mild to mod, for antibx course,  Check covid test at home and let us know, to f/u any worsening symptoms or concerns  Followup: Return in about 6 months (around 05/09/2021).  Cathlean Cower, MD 11/09/2020 8:11 PM [facility]

## 2020-11-09 NOTE — Assessment & Plan Note (Signed)
Lab Results  Component Value Date   HGBA1C 5.8 (H) 04/19/2020   Stable, pt to continue current medical treatment  - metformin

## 2020-11-09 NOTE — Assessment & Plan Note (Signed)
BP Readings from Last 3 Encounters:  11/09/20 112/70  09/30/20 134/70  06/22/20 121/72   Stable, pt to continue medical treatment hct, lopressor

## 2020-11-09 NOTE — Assessment & Plan Note (Signed)
Last vitamin D Lab Results  Component Value Date   VD25OH 34.8 04/19/2020   Low normal, to start oral replacement

## 2020-11-10 LAB — URINALYSIS, ROUTINE W REFLEX MICROSCOPIC
Bilirubin Urine: NEGATIVE
Hgb urine dipstick: NEGATIVE
Ketones, ur: NEGATIVE
Nitrite: NEGATIVE
Specific Gravity, Urine: >=1.03 — AB (ref 1.000–1.030)
Total Protein, Urine: NEGATIVE
Urine Glucose: NEGATIVE
Urobilinogen, UA: 1 (ref 0.0–1.0)
pH: 5.5 (ref 5.0–8.0)

## 2020-11-10 LAB — IBC PANEL
Iron: 23 ug/dL — ABNORMAL LOW (ref 42–145)
Saturation Ratios: 6.4 % — ABNORMAL LOW (ref 20.0–50.0)
TIBC: 359.8 ug/dL (ref 250.0–450.0)
Transferrin: 257 mg/dL (ref 212.0–360.0)

## 2020-11-10 LAB — LIPID PANEL
Cholesterol: 108 mg/dL (ref 0–200)
HDL: 58 mg/dL (ref >39.00)
LDL Cholesterol: 35 mg/dL (ref 0–99)
NonHDL: 50
Total CHOL/HDL Ratio: 2
Triglycerides: 76 mg/dL (ref 0.0–149.0)
VLDL: 15.2 mg/dL (ref 0.0–40.0)

## 2020-11-10 LAB — BASIC METABOLIC PANEL
BUN: 22 mg/dL (ref 6–23)
CO2: 29 mEq/L (ref 19–32)
Calcium: 8.5 mg/dL (ref 8.4–10.5)
Chloride: 104 mEq/L (ref 96–112)
Creatinine, Ser: 0.98 mg/dL (ref 0.40–1.20)
GFR: 64.01 mL/min (ref >60.00)
Glucose, Bld: 90 mg/dL (ref 70–99)
Potassium: 3.7 mEq/L (ref 3.5–5.1)
Sodium: 140 mEq/L (ref 135–145)

## 2020-11-10 LAB — HEPATIC FUNCTION PANEL
ALT: 22 U/L (ref 0–35)
AST: 26 U/L (ref 0–37)
Albumin: 3.5 g/dL (ref 3.5–5.2)
Alkaline Phosphatase: 81 U/L (ref 39–117)
Bilirubin, Direct: 0.1 mg/dL (ref 0.0–0.3)
Total Bilirubin: 0.3 mg/dL (ref 0.2–1.2)
Total Protein: 7 g/dL (ref 6.0–8.3)

## 2020-11-10 LAB — CBC WITH DIFFERENTIAL/PLATELET
Basophils Absolute: 0.1 10*3/uL (ref 0.0–0.1)
Basophils Relative: 1.3 % (ref 0.0–3.0)
Eosinophils Absolute: 0.3 10*3/uL (ref 0.0–0.7)
Eosinophils Relative: 5.9 % — ABNORMAL HIGH (ref 0.0–5.0)
HCT: 32 % — ABNORMAL LOW (ref 36.0–46.0)
Hemoglobin: 10.2 g/dL — ABNORMAL LOW (ref 12.0–15.0)
Lymphocytes Relative: 32.2 % (ref 12.0–46.0)
Lymphs Abs: 1.4 10*3/uL (ref 0.7–4.0)
MCHC: 31.8 g/dL (ref 30.0–36.0)
MCV: 75.5 fl — ABNORMAL LOW (ref 78.0–100.0)
Monocytes Absolute: 0.6 10*3/uL (ref 0.1–1.0)
Monocytes Relative: 12.5 % — ABNORMAL HIGH (ref 3.0–12.0)
Neutro Abs: 2.1 10*3/uL (ref 1.4–7.7)
Neutrophils Relative %: 48.1 % (ref 43.0–77.0)
Platelets: 210 10*3/uL (ref 150.0–400.0)
RBC: 4.24 Mil/uL (ref 3.87–5.11)
RDW: 21.7 % — ABNORMAL HIGH (ref 11.5–15.5)
WBC: 4.4 10*3/uL (ref 4.0–10.5)

## 2020-11-10 LAB — TSH: TSH: 1.44 u[IU]/mL (ref 0.35–5.50)

## 2020-11-10 LAB — VITAMIN D 25 HYDROXY (VIT D DEFICIENCY, FRACTURES): VITD: 45.31 ng/mL (ref 30.00–100.00)

## 2020-11-10 LAB — VITAMIN B12: Vitamin B-12: 568 pg/mL (ref 211–911)

## 2020-11-10 LAB — HEMOGLOBIN A1C: Hgb A1c MFr Bld: 6.2 % (ref 4.6–6.5)

## 2020-11-10 LAB — FERRITIN: Ferritin: 8.6 ng/mL — ABNORMAL LOW (ref 10.0–291.0)

## 2020-11-11 ENCOUNTER — Other Ambulatory Visit: Payer: Self-pay | Admitting: Internal Medicine

## 2020-11-11 ENCOUNTER — Encounter: Payer: Self-pay | Admitting: Internal Medicine

## 2020-11-11 DIAGNOSIS — D509 Iron deficiency anemia, unspecified: Secondary | ICD-10-CM

## 2020-11-11 MED ORDER — POLYSACCHARIDE IRON COMPLEX 150 MG PO CAPS: 150.0000 mg | ORAL_CAPSULE | Freq: Every day | ORAL | 1 refills | Status: DC

## 2020-11-16 ENCOUNTER — Telehealth: Payer: Self-pay | Admitting: Internal Medicine

## 2020-11-16 NOTE — Telephone Encounter (Signed)
Patient calling in  Says she received vm from nurse Shirron & was returning the call  Patient last seen Dr. Jenny Reichmann 11/09/20  Please call patient back [phone]

## 2020-11-16 NOTE — Telephone Encounter (Signed)
Pt stated she does not need the rx for the iron supplement as it is not covered. Pt also stated she was NOT treated by Dr. Collene Mares for anemia ever. Pt wants a f/u with her PCP if need cause she does not want or need Dr. Judi Cong advice.

## 2020-11-16 NOTE — Telephone Encounter (Signed)
LVM for pt to call office back for lab results.

## 2020-11-18 ENCOUNTER — Encounter: Payer: Self-pay | Admitting: Internal Medicine

## 2020-12-01 ENCOUNTER — Other Ambulatory Visit: Payer: Self-pay | Admitting: Internal Medicine

## 2020-12-01 DIAGNOSIS — R7303 Prediabetes: Secondary | ICD-10-CM

## 2020-12-04 ENCOUNTER — Other Ambulatory Visit: Payer: Self-pay | Admitting: Cardiology

## 2020-12-07 ENCOUNTER — Encounter: Payer: Self-pay | Admitting: Internal Medicine

## 2020-12-18 ENCOUNTER — Other Ambulatory Visit: Payer: Self-pay | Admitting: Cardiology

## 2020-12-20 NOTE — Telephone Encounter (Signed)
This is Dr. Crenshaw's pt 

## 2020-12-23 NOTE — Progress Notes (Signed)
Cardiology Office Note   Date:  12/24/2020   ID:  Roberta, [name] June 04, 1965, MRN [id number]  PCP:  [name], MD  Cardiologist: Dr. Stanford Breed No chief complaint on file.    History of Present Illness: Roberta [name] is a 55 y.o. female who presents for ongoing assessment and management of coronary artery disease, status post NSTEMI 12/21/2019.  Underwent cardiac catheterization which revealed a small RI branch with thrombotic occlusion.  She was treated with angioplasty it was too small to stent.  There was a residual PDA narrowing of 50%.  Her echocardiogram at that time showed normal LV function with grade 1 diastolic dysfunction.  She has additional history of hypertension, prediabetes, hyperlipidemia.  Also postmastectomy in the setting of left breast cancer in 8/19.  When last seen by Dr. Coletta Memos, FNP, on 03/09/2020, she denied any recurrent chest discomfort.  She was concerned about hypotension issues her diastolic readings had been in the 50s.  She had been told to stop HCTZ before coming in to the office.  She also stated that she was under a lot of stress and occasionally had some chest pressure took the nitroglycerin and her pain was relieved.  Dr. Stanford Breed started her on 15 mg of isosorbide daily, she was to increase her physical activity.  She did not tolerate isosorbide due to headaches and therefore was started on Ranexa 500 mg twice daily.  She had also contacted the office prior to the visit due to lower extremity edema and was re prescribed HCTZ 12.5 mg daily.  She was given a low-sodium instruction sheet and discussed wearing support hose and given information on that.  She was to follow-up in 6 months.  A BMET was ordered.  The labs were unremarkable with the exception of a low calcium of 8.6.  She was seen by her primary care physician on 11/09/2020 for acute fever facial pain pressure headache generalized weakness malaise and greenish discharge with cough.   She was treated for upper respiratory infection.  She comes today without any cardiac complaints.  She does continue to have some postoperative mastectomy pain on the left which is more soreness and stiffness.  She denies shortness of breath, dizziness, palpitations, melena or epistaxis with clopidogrel, but does have frequent bruising.  Her main complaint today is low or lack of libido.  She is being followed by GYN and PCP for this but they ask her to see cardiology for any medications which may be contributing to her decreased libido.   Past Medical History:  Diagnosis Date   Back pain    Cancer ([place])    multifocal left breast   Depression    Hip pain    Hypertension    Obesity    PONV (postoperative nausea and vomiting)    Prediabetes    Swelling    bilat LE   Wears glasses     Past Surgical History:  Procedure Laterality Date   CHOLECYSTECTOMY     COLONOSCOPY     CORONARY BALLOON ANGIOPLASTY N/A 12/11/2019   Procedure: CORONARY BALLOON ANGIOPLASTY;  Surgeon: [name], MD;  Location: [facility];  Service: Cardiovascular;  Laterality: N/A;   DILATION AND CURETTAGE OF UTERUS     GASTRIC BYPASS     KNEE SURGERY  2002   Ligament repair   LEFT HEART CATH AND CORONARY ANGIOGRAPHY N/A 12/11/2019   Procedure: LEFT HEART CATH AND CORONARY ANGIOGRAPHY;  Surgeon: [name], MD;  Location: [facility]  INVASIVE CV LAB;  Service: Cardiovascular;  Laterality: N/A;   MASTECTOMY Left 2019   MASTECTOMY W/ SENTINEL NODE BIOPSY Left 10/01/2018   Procedure: LEFT TOTAL MASTECTOMY WITH SENTINEL LYMPH NODE BIOPSY AND BLUE DYE INJECTION;  Surgeon: Fanny Skates, MD;  Location: [facility];  Service: General;  Laterality: Left;     Current Outpatient Medications  Medication Sig Dispense Refill   anastrozole (ARIMIDEX) 1 MG tablet Take 1 tablet (1 mg total) by mouth daily. 90 tablet 4   aspirin EC 81 MG EC tablet Take 1 tablet (81 mg total) by mouth daily. Swallow whole. 30 tablet 11    atorvastatin (LIPITOR) 80 MG tablet Take 1 tablet (80 mg total) by mouth daily. 90 tablet 3   CINNAMON PO Take 2 capsules by mouth daily.     cyclobenzaprine (FLEXERIL) 5 MG tablet Take 1 tablet (5 mg total) by mouth 3 (three) times daily as needed for muscle spasms. 30 tablet 1   gabapentin (NEURONTIN) 300 MG capsule Take 1 capsule (300 mg total) by mouth at bedtime. 90 capsule 4   HYDROcodone-acetaminophen (NORCO/VICODIN) 5-325 MG tablet Take 0.5-1 tablets by mouth every 4 (four) hours as needed for severe pain. 20 tablet 0   metFORMIN (GLUCOPHAGE) 500 MG tablet TAKE 1 TABLET BY MOUTH EVERY DAY IN THE MORNING 30 tablet 0   metoprolol tartrate (LOPRESSOR) 25 MG tablet Take 0.5 tablets (12.5 mg total) by mouth 2 (two) times daily. 180 tablet 0   Multiple Vitamin (MULTIVITAMIN) tablet Take 1 tablet by mouth daily.     ranolazine (RANEXA) 500 MG 12 hr tablet TAKE 1 TABLET BY MOUTH TWICE A DAY 180 tablet 0   venlafaxine XR (EFFEXOR-XR) 75 MG 24 hr capsule Take 1 capsule (75 mg total) by mouth daily with breakfast. 90 capsule 4   hydrochlorothiazide (MICROZIDE) 12.5 MG capsule TAKE 1 CAPSULE BY MOUTH EVERY DAY 30 capsule 3   iron polysaccharides (NU-IRON) 150 MG capsule Take 1 capsule (150 mg total) by mouth daily. 90 capsule 1   nitroGLYCERIN (NITROSTAT) 0.4 MG SL tablet Place 1 tablet (0.4 mg total) under the tongue every 5 (five) minutes x 3 doses as needed for chest pain. 25 tablet 5   No current facility-administered medications for this visit.    Allergies:   Lisinopril, Penicillins, Imdur [isosorbide nitrate], Norvasc [amlodipine besylate], Red dye, and Tetanus toxoids    Social History:  The patient  reports that she has never smoked. She has never used smokeless tobacco. She reports current alcohol use. She reports that she does not use drugs.   Family History:  The patient's family history includes Cancer in her maternal aunt and maternal uncle; Diabetes in her mother; Heart disease in  her mother; Hypertension in her mother; Kidney disease in her mother; Obesity in her mother; Stroke in her mother; Throat cancer in her maternal aunt.    ROS: All other systems are reviewed and negative. Unless otherwise mentioned in H&P    PHYSICAL EXAM: VS:  BP (!) 142/92    Pulse 67    Resp 14    Ht 5\' 6"  (1.676 m)    Wt 272 lb 9.6 oz (123.7 kg)    SpO2 98%    BMI 44.00 kg/m  , BMI Body mass index is 44 kg/m. GEN: Well nourished, well developed, in no acute distress HEENT: normal Neck: no JVD, carotid bruits, or masses Cardiac: RRR; no murmurs, rubs, or gallops,no edema  Respiratory:  Clear to auscultation bilaterally, normal  work of breathing GI: soft, nontender, nondistended, + BS MS: no deformity or atrophy Skin: warm and dry, no rash, bruises noted on arms, thighs.  Neuro:  Strength and sensation are intact Psych: euthymic mood, full affect   EKG:  EKG is ordered today. The ekg ordered today demonstrates(Personally reviewed) NSR rate of 67 bpm.    Recent Labs: 11/09/2020: ALT 22; BUN 22; Creatinine, Ser 0.98; Hemoglobin 10.2; Platelets 210.0; Potassium 3.7; Sodium 140; TSH 1.44    Lipid Panel    Component Value Date/Time   CHOL 108 11/09/2020 1614   CHOL 117 04/19/2020 0943   TRIG 76.0 11/09/2020 1614   HDL 58.00 11/09/2020 1614   HDL 72 04/19/2020 0943   CHOLHDL 2 11/09/2020 1614   VLDL 15.2 11/09/2020 1614   LDLCALC 35 11/09/2020 1614   LDLCALC 35 04/19/2020 0943      Wt Readings from Last 3 Encounters:  12/24/20 272 lb 9.6 oz (123.7 kg)  11/09/20 266 lb (120.7 kg)  09/30/20 269 lb 14.4 oz (122.4 kg)     Other studies Reviewed Echocardiogram 12/12/2019 1. Left ventricular ejection fraction, by estimation, is 65 to 70%. The  left ventricle has normal function. The left ventricle has no regional  wall motion abnormalities. Left ventricular diastolic parameters are  consistent with Grade I diastolic  dysfunction (impaired relaxation).   2. Right  ventricular systolic function is normal. The right ventricular  size is normal.   3. The mitral valve is normal in structure. No evidence of mitral valve  regurgitation. No evidence of mitral stenosis.   4. The aortic valve was not well visualized. Aortic valve regurgitation  is not visualized. No aortic stenosis is present.   5. The inferior vena cava is normal in size with greater than 50%  respiratory variability, suggesting right atrial pressure of 3 mmHg.   Cardiac Cath 12/11/2019 Conclusions: Two-vessel coronary artery disease with thrombotic occlusion of small ramus intermedius (culprit lesion) and 50% stenosis of small rPDA. Mildly reduced left ventricular systolic function with mid anterolateral hypokinesis.  Left ventricular filling pressure is normal. Successful PTCA of proximal ramus intermedius with acceptable angiographic result and restoration of TIMI-3 flow.  Stent placement was not performed due to small vessel size and tortuosity.   Recommendations: Dual antiplatelet therapy with aspirin and ticagrelor for 12 months. Aggressive secondary prevention and medical therapy of residual ramus and rPDA disease.  ASSESSMENT AND PLAN:  1.  Coronary artery disease: History of non-STEMI in 12 of 2021 with cardiac catheterization revealing a small RI branch with thrombotic occlusion.  She did not require stenting.  I will stop her Plavix as she is having excessive bruising and it is been 1 year since intervention with angioplasty.  She is asymptomatic.  She will continue secondary risk factor modification with weight loss, increased exercise, and statin therapy.  2.  Hyperlipidemia: Remains on atorvastatin 80 mg daily.  Most recent labs 11/09/2020 LDL 35, total cholesterol 100, HDL 58.  She will need to have follow-up  4. Low libido:  Doubt that her BB is contributing to this, although BB can affect libido at higher doses. Estrogen suppression and Effexor seem more likely. She is to see  her oncologist and her Gyn specialist for further recommendations.  I will check a BMET, CBC, vitamin D, and TSH to evaluate any abnormalities which may be contributing.   Current medicines are reviewed at length with the patient today.  I have spent 25 min's  dedicated to the care  of this patient on the date of this encounter to include pre-visit review of records, assessment, management and diagnostic testing,with shared decision making.  Labs/ tests ordered today include: BMET, CBC, Vitamin D. TSH  Phill Myron. West Pugh, ANP, [facility]   12/24/2020 10:53 AM    [facility] [place] [address] Office [phone] Fax [phone]  Notice: This dictation was prepared with Dragon dictation along with smaller phrase technology. Any transcriptional errors that result from this process are unintentional and may not be corrected upon review.

## 2020-12-24 ENCOUNTER — Encounter: Payer: Self-pay | Admitting: Adult Health

## 2020-12-24 ENCOUNTER — Ambulatory Visit (INDEPENDENT_AMBULATORY_CARE_PROVIDER_SITE_OTHER): Payer: BC Managed Care – PPO | Admitting: Adult Health

## 2020-12-24 ENCOUNTER — Other Ambulatory Visit: Payer: Self-pay

## 2020-12-24 VITALS — BP 142/92 | HR 67 | Resp 14 | Ht 66.0 in | Wt 272.6 lb

## 2020-12-24 DIAGNOSIS — E559 Vitamin D deficiency, unspecified: Secondary | ICD-10-CM

## 2020-12-24 DIAGNOSIS — Z9861 Coronary angioplasty status: Secondary | ICD-10-CM

## 2020-12-24 DIAGNOSIS — I2510 Atherosclerotic heart disease of native coronary artery without angina pectoris: Secondary | ICD-10-CM

## 2020-12-24 DIAGNOSIS — I10 Essential (primary) hypertension: Secondary | ICD-10-CM | POA: Diagnosis not present

## 2020-12-24 DIAGNOSIS — D509 Iron deficiency anemia, unspecified: Secondary | ICD-10-CM

## 2020-12-24 DIAGNOSIS — R6882 Decreased libido: Secondary | ICD-10-CM

## 2020-12-24 DIAGNOSIS — E7800 Pure hypercholesterolemia, unspecified: Secondary | ICD-10-CM

## 2020-12-24 MED ORDER — NITROGLYCERIN 0.4 MG SL SUBL: 0.4000 mg | SUBLINGUAL_TABLET | SUBLINGUAL | 5 refills | Status: AC | PRN

## 2020-12-24 MED ORDER — HYDROCHLOROTHIAZIDE 12.5 MG PO CAPS: ORAL_CAPSULE | ORAL | 3 refills | Status: DC

## 2020-12-24 NOTE — Patient Instructions (Signed)
Medication Instructions:  Stop taking plavix.  Your Physician recommend you continue on your current medication as directed.    *If you need a refill on your cardiac medications before your next appointment, please call your pharmacy*   Lab Work: Return for fasting blood work (lipids, cmet, cbc, tsh, vit d)  If you have labs (blood work) drawn today and your tests are completely normal, you will receive your results only by: [place] (if you have MyChart) OR A paper copy in the mail If you have any lab test that is abnormal or we need to change your treatment, we will call you to review the results.    Follow-Up: At [facility], you and your health needs are our priority.  As part of our continuing mission to provide you with exceptional heart care, we have created designated Provider Care Teams.  These Care Teams include your primary Cardiologist (physician) and Advanced Practice Providers (APPs -  Physician Assistants and Nurse Practitioners) who all work together to provide you with the care you need, when you need it.  We recommend signing up for the patient portal called "MyChart".  Sign up information is provided on this After Visit Summary.  MyChart is used to connect with patients for Virtual Visits (Telemedicine).  Patients are able to view lab/test results, encounter notes, upcoming appointments, etc.  Non-urgent messages can be sent to your provider as well.   To learn more about what you can do with MyChart, go to [email or web address].    Your next appointment:   6 month(s)  The format for your next appointment:   In Person  Provider:   Kirk Ruths, MD

## 2020-12-30 ENCOUNTER — Other Ambulatory Visit: Payer: Self-pay | Admitting: Internal Medicine

## 2020-12-30 DIAGNOSIS — R7303 Prediabetes: Secondary | ICD-10-CM

## 2020-12-30 LAB — COMPREHENSIVE METABOLIC PANEL
ALT: 22 IU/L (ref 0–32)
AST: 24 IU/L (ref 0–40)
Albumin/Globulin Ratio: 1.4 (ref 1.2–2.2)
Albumin: 3.7 g/dL — ABNORMAL LOW (ref 3.8–4.9)
Alkaline Phosphatase: 100 IU/L (ref 44–121)
BUN/Creatinine Ratio: 17 (ref 9–23)
BUN: 18 mg/dL (ref 6–24)
Bilirubin Total: 0.4 mg/dL (ref 0.0–1.2)
CO2: 26 mmol/L (ref 20–29)
Calcium: 8.8 mg/dL (ref 8.7–10.2)
Chloride: 105 mmol/L (ref 96–106)
Creatinine, Ser: 1.04 mg/dL — ABNORMAL HIGH (ref 0.57–1.00)
Globulin, Total: 2.6 g/dL (ref 1.5–4.5)
Glucose: 88 mg/dL (ref 70–99)
Potassium: 4 mmol/L (ref 3.5–5.2)
Sodium: 142 mmol/L (ref 134–144)
Total Protein: 6.3 g/dL (ref 6.0–8.5)
eGFR: 63 mL/min/{1.73_m2} (ref >59)

## 2020-12-30 LAB — CBC
Hematocrit: 38.5 % (ref 34.0–46.6)
Hemoglobin: 12.3 g/dL (ref 11.1–15.9)
MCH: 25.5 pg — ABNORMAL LOW (ref 26.6–33.0)
MCHC: 31.9 g/dL (ref 31.5–35.7)
MCV: 80 fL (ref 79–97)
Platelets: 194 10*3/uL (ref 150–450)
RBC: 4.82 x10E6/uL (ref 3.77–5.28)
RDW: 18.5 % — ABNORMAL HIGH (ref 11.7–15.4)
WBC: 3.5 10*3/uL (ref 3.4–10.8)

## 2020-12-30 LAB — LIPID PANEL
Chol/HDL Ratio: 1.6 ratio (ref 0.0–4.4)
Cholesterol, Total: 122 mg/dL (ref 100–199)
HDL: 77 mg/dL (ref >39)
LDL Chol Calc (NIH): 34 mg/dL (ref 0–99)
Triglycerides: 45 mg/dL (ref 0–149)
VLDL Cholesterol Cal: 11 mg/dL (ref 5–40)

## 2020-12-30 LAB — VITAMIN D 25 HYDROXY (VIT D DEFICIENCY, FRACTURES): Vit D, 25-Hydroxy: 43.4 ng/mL (ref 30.0–100.0)

## 2020-12-30 LAB — TSH: TSH: 1.82 u[IU]/mL (ref 0.450–4.500)

## 2021-01-02 ENCOUNTER — Encounter: Payer: Self-pay | Admitting: Internal Medicine

## 2021-01-05 ENCOUNTER — Telehealth: Payer: Self-pay

## 2021-01-05 NOTE — Telephone Encounter (Addendum)
Called patient regarding results.----- Message from Lendon Colonel, NP sent at 12/30/2020 10:11 AM EST ----- I have reviewed her labs. Excellent control of cholesterol, CBC shows slightly low MCH which is an indicator for how much Hgb in the red blood cell. Not concerning at this time. Slightly higher RDW but much improved from one month ago. This indicates her history of cancer and anemia. She has some allergies going on by Eosinophils slightly elevated. Albumin is slightly low, but better than previous labs. Indication of protein which is getting better. Thyroid is normal, and Vitamin D is normal.  No concerns here about her abnormal labs indicating cause of fatigue.   KL

## 2021-01-06 MED ORDER — VENLAFAXINE HCL ER 37.5 MG PO CP24: 37.5000 mg | ORAL_CAPSULE | Freq: Every day | ORAL | 5 refills | Status: DC

## 2021-01-06 NOTE — Addendum Note (Signed)
Addended by: [name] on: 01/06/2021 09:25 PM   Modules accepted: Orders

## 2021-01-10 ENCOUNTER — Encounter: Payer: Self-pay | Admitting: Internal Medicine

## 2021-01-10 MED ORDER — VENLAFAXINE HCL ER 37.5 MG PO CP24: 37.5000 mg | ORAL_CAPSULE | Freq: Every day | ORAL | 5 refills | Status: DC

## 2021-01-31 ENCOUNTER — Other Ambulatory Visit: Payer: Self-pay | Admitting: Internal Medicine

## 2021-01-31 DIAGNOSIS — R7303 Prediabetes: Secondary | ICD-10-CM

## 2021-02-03 ENCOUNTER — Other Ambulatory Visit: Payer: Self-pay | Admitting: Internal Medicine

## 2021-02-28 ENCOUNTER — Other Ambulatory Visit: Payer: Self-pay | Admitting: Internal Medicine

## 2021-02-28 DIAGNOSIS — R7303 Prediabetes: Secondary | ICD-10-CM

## 2021-03-14 ENCOUNTER — Telehealth: Payer: Self-pay | Admitting: Internal Medicine

## 2021-03-14 NOTE — Telephone Encounter (Signed)
Connected to [facility] 3.11.2023. ? ?Caller is having swelling in her legs x few days. ?Has been wearing compression socks. Denies hx blood clots or CHF. ? ?Advised to see [facility] within 4 hours. ?

## 2021-03-25 ENCOUNTER — Other Ambulatory Visit: Payer: Self-pay | Admitting: Internal Medicine

## 2021-03-25 DIAGNOSIS — R7303 Prediabetes: Secondary | ICD-10-CM

## 2021-04-08 ENCOUNTER — Other Ambulatory Visit: Payer: Self-pay | Admitting: Internal Medicine

## 2021-04-08 DIAGNOSIS — R7303 Prediabetes: Secondary | ICD-10-CM

## 2021-04-11 NOTE — Progress Notes (Signed)
? ? ?Subjective:  ? ? Patient ID: Roberta [name], female    DOB: [birth year], 56 y.o.   MRN: [id number] ? ?This visit occurred during the SARS-CoV-2 public health emergency.  Safety protocols were in place, including screening questions prior to the visit, additional usage of staff PPE, and extensive cleaning of exam room while observing appropriate contact time as indicated for disinfecting solutions. ? ? ? ?HPI ?Darcell is here for  ?Chief Complaint  ?Patient presents with  ? Knee Pain  ?  Right knee pain  ? ? ? ?Right knee pain - right knee aches all the time. A while ago, several weeks ago,  she was on the treadmill and walking on an incline and the knee did not feel right and she wonders if she did something.  If she sits long and gets up it hurts.  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  When she is walking it takes her a while to get moving and once she gets moving it feels ok.  No pain with sitting or sleeping.  No swelling.  It hurts mostly in knee cap area.  She has taken tylenol.  No improvement since it started.   ? ? ? ?Medications and allergies reviewed with patient and updated if appropriate. ? ?Current Outpatient Medications on File Prior to Visit  ?Medication Sig Dispense Refill  ? anastrozole (ARIMIDEX) 1 MG tablet Take 1 tablet (1 mg total) by mouth daily. 90 tablet 4  ? aspirin EC 81 MG EC tablet Take 1 tablet (81 mg total) by mouth daily. Swallow whole. 30 tablet 11  ? atorvastatin (LIPITOR) 80 MG tablet Take 1 tablet (80 mg total) by mouth daily. 90 tablet 3  ? CINNAMON PO Take 2 capsules by mouth daily.    ? Ferrous Sulfate (IRON PO) Take 65 mg by mouth daily.    ? hydrochlorothiazide (MICROZIDE) 12.5 MG capsule TAKE 1 CAPSULE BY MOUTH EVERY DAY 30 capsule 3  ? metoprolol tartrate (LOPRESSOR) 25 MG tablet Take 0.5 tablets (12.5 mg total) by mouth 2 (two) times daily. 180 tablet 0  ? Multiple Vitamin (MULTIVITAMIN) tablet Take 1 tablet by mouth daily.    ?  nitroGLYCERIN (NITROSTAT) 0.4 MG SL tablet Place 1 tablet (0.4 mg total) under the tongue every 5 (five) minutes x 3 doses as needed for chest pain. 25 tablet 5  ? ranolazine (RANEXA) 500 MG 12 hr tablet TAKE 1 TABLET BY MOUTH TWICE A DAY 180 tablet 0  ? ?No current facility-administered medications on file prior to visit.  ? ? ?Review of Systems ? ?   ?Objective:  ? ?Vitals:  ? 04/12/21 0937  ?BP: (!) 150/90  ?Pulse: 66  ?Temp: 98.1 ?F (36.7 ?C)  ?SpO2: 97%  ? ?BP Readings from Last 3 Encounters:  ?04/12/21 (!) 150/90  ?12/24/20 (!) 142/92  ?11/09/20 112/70  ? ?Wt Readings from Last 3 Encounters:  ?04/12/21 266 lb (120.7 kg)  ?12/24/20 272 lb 9.6 oz (123.7 kg)  ?11/09/20 266 lb (120.7 kg)  ? ?Body mass index is 42.93 kg/m?. ? ?  ?Physical Exam ?   ?A right knee exam was performed.  ? ? ?SWELLING: No ?EFFUSION: None ?WARMTH: no warmth  ?TENDERNESS: Minimal tenderness patella and patellar tendon ?ROM: full extension, full flexion  ?GAIT: Normal ?NEUROLOGICAL EXAM: normal sensation  ?CALF TENDERNESS: no  ? ? ? ? ? ?Assessment & Plan:  ? ? ?See Problem List for Assessment and Plan of  chronic medical problems.  ? ? ? ? ?

## 2021-04-12 ENCOUNTER — Ambulatory Visit (INDEPENDENT_AMBULATORY_CARE_PROVIDER_SITE_OTHER): Payer: BC Managed Care – PPO

## 2021-04-12 ENCOUNTER — Encounter: Payer: Self-pay | Admitting: Internal Medicine

## 2021-04-12 ENCOUNTER — Ambulatory Visit: Payer: BC Managed Care – PPO | Admitting: Internal Medicine

## 2021-04-12 VITALS — BP 140/78 | HR 66 | Temp 98.1°F | Ht 66.0 in | Wt 266.0 lb

## 2021-04-12 DIAGNOSIS — M25561 Pain in right knee: Secondary | ICD-10-CM

## 2021-04-12 NOTE — Assessment & Plan Note (Signed)
Acute ?Started several weeks ago and not improving ?She thinks she may have injured it when she was walking on an incline on the treadmill ?Has been taking Tylenol ?Advised ice, Voltaren gel-she should not be taking NSAIDs orally ?X-ray today ?Referred to sports medicine ?

## 2021-04-12 NOTE — Patient Instructions (Addendum)
? ? ? ?  Have an xray downstairs. ? ? ? ?Medications changes include :  try voltaren gel on the right knee.  Ice the knee.    ? ? ? ? ?A referral was ordered for sports medicine.     Someone from that office will call you to schedule an appointment.  ? ? ?

## 2021-04-13 ENCOUNTER — Other Ambulatory Visit: Payer: Self-pay | Admitting: Cardiology

## 2021-04-13 ENCOUNTER — Encounter: Payer: Self-pay | Admitting: Internal Medicine

## 2021-04-13 NOTE — Progress Notes (Signed)
? ? ?Subjective:   ? ?CC: R knee pain ? ?I, Roberta [name], LAT, ATC, am serving as scribe for Dr. Lynne Leader. ? ?HPI: Pt is a 56 y/o female c/o R knee pain x approximately one month w/ no specific MOI.  She does recall walking at an incline on the treadmill a few weeks ago and the knee not feeling right and wonders if this has anything to do w/ her current pain.  She locates her pain to around the R patella, along the anterior aspect of the R knee. Pt describes the pain as a "dull ache." ? ?R knee swelling: no ?R knee mechanical symptoms: no ?Aggravating factors: prolonged standing or walking; transitioning from sit-to-stand after sitting for a prolonged period of time;  ?Treatments tried: Tylenol; Tiger balm ? ?Diagnostic testing: R knee XR- 04/12/21 ? ?Pertinent review of Systems: No fevers or chills ? ?Relevant historical information: History NSTEMI and percutaneous coronary angioplasty.  History of breast cancer with mastectomy breast. ?History gastric bypass ? ? ?Objective:   ? ?Vitals:  ? 04/14/21 0849  ?BP: (!) 148/92  ?Pulse: 66  ?SpO2: 98%  ? ?General: Well Developed, well nourished, and in no acute distress.  ? ?MSK: Right knee: Normal. ?Tender palpation anterior knee. ?Normal knee motion. ?Intact strength. ?Stable ligamentous exam. ? ?Lab and Radiology Results ? ?Diagnostic Limited MSK Ultrasound of: Right knee ?Quad tendon intact normal. ?Patellar tendon calcific change at the patellar tendon origin from the inferior patellar pole. ?The tendon itself is intact without visible tear. ?Lateral joint line normal. ?Medial joint line narrowed degenerative with degenerative appearing medial meniscus. ?Impression: Calcific patellar tendinitis proximal patellar tendon ? ? ?No results found for this or any previous visit (from the past 72 hour(s)). ?DG Knee Complete 4 Views Right ? ?Result Date: 04/12/2021 ?CLINICAL DATA:  patella pain EXAM: RIGHT KNEE - COMPLETE 4+ VIEW COMPARISON:  X-ray right knee 02/10/2020  FINDINGS: No evidence of fracture, dislocation, or joint effusion. Tricompartmental mild to moderate degenerative changes of the knee. Enthesophyte formation along the superior aspect of the patella. Old healed patellar fracture along the inferior enthesopathy. Soft tissues are unremarkable. IMPRESSION: No acute displaced fracture or dislocation. Electronically Signed   By: Iven Finn M.D.   On: 04/12/2021 21:35   ? ?I, Lynne Leader, personally (independently) visualized and performed the interpretation of the images attached in this note. ? ? ?Impression and Recommendations:   ? ?Assessment and Plan: ?56 y.o. 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  She also does have some degenerative changes at the medial joint line which I think may be contributory as well.  She is an excellent candidate for physical therapy.  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  Early morning is probably her best option.  We did some googling and there are some potential options for her that open at 7 AM.  She will do some looking around and let me know which physical therapy office she would like to use. ? ?Also recommend nitroglycerin patch protocol which I prescribed.  We talked about potential side effects with this. ? ?Recheck in 6 to 8 weeks.. ? ?PDMP not reviewed this encounter. ?Orders Placed This Encounter  ?Procedures  ? [place] LIMITED JOINT SPACE STRUCTURES LOW RIGHT(NO LINKED CHARGES)  ?  Order Specific Question:   Reason for Exam (SYMPTOM  OR DIAGNOSIS REQUIRED)  ?  Answer:   R knee pain  ?  Order Specific Question:   Preferred imaging location?  ?  Answer:   [facility]  ? ?No orders of the defined types were placed in this encounter. ? ? ?Discussed warning signs or symptoms. Please see discharge instructions. Patient expresses  understanding. ? ? ?The above documentation has been reviewed and is accurate and complete Lynne Leader, M.D. ? ?

## 2021-04-14 ENCOUNTER — Ambulatory Visit: Payer: BC Managed Care – PPO | Admitting: Family Medicine

## 2021-04-14 ENCOUNTER — Ambulatory Visit: Payer: Self-pay

## 2021-04-14 VITALS — BP 148/92 | HR 66 | Ht 66.0 in | Wt 272.8 lb

## 2021-04-14 DIAGNOSIS — M7651 Patellar tendinitis, right knee: Secondary | ICD-10-CM

## 2021-04-14 DIAGNOSIS — M25561 Pain in right knee: Secondary | ICD-10-CM

## 2021-04-14 MED ORDER — NITROGLYCERIN 0.2 MG/HR TD PT24: 0.2000 mg | MEDICATED_PATCH | Freq: Every day | TRANSDERMAL | 1 refills | Status: DC

## 2021-04-14 NOTE — Patient Instructions (Addendum)
Thank you for coming in today.  ? ?Nitroglycerin Protocol ?Apply 1/4 nitroglycerin patch to affected area daily. ?Change position of patch within the affected area every 24 hours. ?You may experience a headache during the first 1-2 weeks of using the patch, these should subside. ?If you experience headaches after beginning nitroglycerin patch treatment, you may take your preferred over the counter pain reliever. ?Another side effect of the nitroglycerin patch is skin irritation or rash related to patch adhesive. ?Please notify our office if you develop more severe headaches or rash, and stop the patch. ?Tendon healing with nitroglycerin patch may require 12 to 24 weeks depending on the extent of injury. ?Men should not use if taking Viagra, Cialis, or Levitra.  ?Do not use if you have migraines or rosacea.   ? ?Let us know the location you would like the physical therapy referral sent to ? ?Recheck back in 8 weeks ?

## 2021-04-15 ENCOUNTER — Other Ambulatory Visit: Payer: Self-pay | Admitting: Medical

## 2021-04-15 ENCOUNTER — Other Ambulatory Visit: Payer: Self-pay

## 2021-04-15 ENCOUNTER — Encounter: Payer: Self-pay | Admitting: Family Medicine

## 2021-04-15 DIAGNOSIS — M7651 Patellar tendinitis, right knee: Secondary | ICD-10-CM

## 2021-04-20 ENCOUNTER — Other Ambulatory Visit: Payer: Self-pay

## 2021-04-20 DIAGNOSIS — M7651 Patellar tendinitis, right knee: Secondary | ICD-10-CM

## 2021-05-03 ENCOUNTER — Encounter: Payer: Self-pay | Admitting: Hematology and Oncology

## 2021-05-22 ENCOUNTER — Encounter: Payer: Self-pay | Admitting: Internal Medicine

## 2021-05-22 NOTE — Progress Notes (Signed)
    Subjective:    Patient ID: Roberta [name], female    DOB: 10-28-1965, 56 y.o.   MRN: [id number]      HPI Sister is here for  Chief Complaint  Patient presents with   Ear Fullness    Right ear fullness    Excessive wax -she states 56 year old fullness, clogged clogged sensation and decreased hearing.  She denies ear pain, discharge from the ear or neck glands or cold symptoms.      Medications and allergies reviewed with patient and updated if appropriate.  Current Outpatient Medications on File Prior to Visit  Medication Sig Dispense Refill   anastrozole (ARIMIDEX) 1 MG tablet Take 1 tablet (1 mg total) by mouth daily. 90 tablet 4   aspirin EC 81 MG EC tablet Take 1 tablet (81 mg total) by mouth daily. Swallow whole. 30 tablet 11   atorvastatin (LIPITOR) 80 MG tablet Take 1 tablet (80 mg total) by mouth daily. Patient need a appointment for future refill. 90 tablet 0   CINNAMON PO Take 2 capsules by mouth daily.     Ferrous Sulfate (IRON PO) Take 65 mg by mouth daily.     hydrochlorothiazide (MICROZIDE) 12.5 MG capsule TAKE 1 CAPSULE BY MOUTH EVERY DAY 30 capsule 3   metoprolol tartrate (LOPRESSOR) 25 MG tablet Take 0.5 tablets (12.5 mg total) by mouth 2 (two) times daily. 180 tablet 0   Multiple Vitamin (MULTIVITAMIN) tablet Take 1 tablet by mouth daily.     nitroGLYCERIN (NITROSTAT) 0.4 MG SL tablet Place 1 tablet (0.4 mg total) under the tongue every 5 (five) minutes x 3 doses as needed for chest pain. 25 tablet 5   ranolazine (RANEXA) 500 MG 12 hr tablet TAKE 1 TABLET BY MOUTH TWICE A DAY 180 tablet 3   No current facility-administered medications on file prior to visit.    Review of Systems     Objective:   Vitals:   05/23/21 1101  BP: (!) 142/80  Pulse: 60  Temp: 98.2 F (36.8 C)  SpO2: 98%   BP Readings from Last 3 Encounters:  05/23/21 (!) 142/80  04/14/21 (!) 148/92  04/12/21 140/78   Wt Readings from Last 3 Encounters:  05/23/21 269 lb (122  kg)  04/14/21 272 lb 12.8 oz (123.7 kg)  04/12/21 266 lb (120.7 kg)   Body mass index is 43.42 kg/m.    Physical Exam    PRE-PROCEDURE EXAM: Right TM cannot be visualized due to total occlusion/impaction of the ear canal. PROCEDURE INDICATION: remove wax to visualize ear drum & relieve discomfort/improve hearing CONSENT:  Verbal  PROCEDURE NOTE:   RIGHT EAR:  The CMA used a metal wax curette under direct vision with an otoscope to free the wax bolus from the ear wall and then successfully removed a small bit of wax. The ear was then irrigated with warm water to remove the majority of wax.  This was still remaining, but because she was able to hear and he did not want irritate the ear canal any further we decided to. POST- PROCEDURE EXAM: TMs not visualized.  Partially obstructing wax still present deep in the ear canal, but hearing restored.  She tolerated the procedure well was relieved that she was able to hear again.      Assessment & Plan:    See Problem List for Assessment and Plan of chronic medical problems.

## 2021-05-23 ENCOUNTER — Ambulatory Visit (INDEPENDENT_AMBULATORY_CARE_PROVIDER_SITE_OTHER): Payer: BC Managed Care – PPO | Admitting: Internal Medicine

## 2021-05-23 VITALS — BP 142/80 | HR 60 | Temp 98.2°F | Ht 66.0 in | Wt 269.0 lb

## 2021-05-23 DIAGNOSIS — H9191 Unspecified hearing loss, right ear: Secondary | ICD-10-CM | POA: Insufficient documentation

## 2021-05-23 DIAGNOSIS — H6121 Impacted cerumen, right ear: Secondary | ICD-10-CM | POA: Diagnosis not present

## 2021-05-23 NOTE — Patient Instructions (Signed)
You right ear was cleaned out today.     Earwax Buildup, Adult The ears produce a substance called earwax that helps keep bacteria out of the ear and protects the skin in the ear canal. Occasionally, earwax can build up in the ear and cause discomfort or hearing loss. What are the causes? This condition is caused by a buildup of earwax. Ear canals are self-cleaning. Ear wax is made in the outer part of the ear canal and generally falls out in small amounts over time. When the self-cleaning mechanism is not working, earwax builds up and can cause decreased hearing and discomfort. Attempting to clean ears with cotton swabs can push the earwax deep into the ear canal and cause decreased hearing and pain. What increases the risk? This condition is more likely to develop in people who: Clean their ears often with cotton swabs. Pick at their ears. Use earplugs or in-ear headphones often, or wear hearing aids. The following factors may also make you more likely to develop this condition: Being female. Being of older age. Naturally producing more earwax. Having narrow ear canals. Having earwax that is overly thick or sticky. Having excess hair in the ear canal. Having eczema. Being dehydrated. What are the signs or symptoms? Symptoms of this condition include: Reduced or muffled hearing. A feeling of fullness in the ear or feeling that the ear is plugged. Fluid coming from the ear. Ear pain or an itchy ear. Ringing in the ear. Coughing. Balance problems. An obvious piece of earwax that can be seen inside the ear canal. How is this diagnosed? This condition may be diagnosed based on: Your symptoms. Your medical history. An ear exam. During the exam, your health care provider will look into your ear with an instrument called an otoscope. You may have tests, including a hearing test. How is this treated? This condition may be treated by: Using ear drops to soften the earwax. Having  the earwax removed by a health care provider. The health care provider may: Flush the ear with water. Use an instrument that has a loop on the end (curette). Use a suction device. Having surgery to remove the wax buildup. This may be done in severe cases. Follow these instructions at home:  Take over-the-counter and prescription medicines only as told by your health care provider. Do not put any objects, including cotton swabs, into your ear. You can clean the opening of your ear canal with a washcloth or facial tissue. Follow instructions from your health care provider about cleaning your ears. Do not overclean your ears. Drink enough fluid to keep your urine pale yellow. This will help to thin the earwax. Keep all follow-up visits as told. If earwax builds up in your ears often or if you use hearing aids, consider seeing your health care provider for routine, preventive ear cleanings. Ask your health care provider how often you should schedule your cleanings. If you have hearing aids, clean them according to instructions from the manufacturer and your health care provider. Contact a health care provider if: You have ear pain. You develop a fever. You have pus or other fluid coming from your ear. You have hearing loss. You have ringing in your ears that does not go away. You feel like the room is spinning (vertigo). Your symptoms do not improve with treatment. Get help right away if: You have bleeding from the affected ear. You have severe ear pain. Summary Earwax can build up in the ear and  cause discomfort or hearing loss. The most common symptoms of this condition include reduced or muffled hearing, a feeling of fullness in the ear, or feeling that the ear is plugged. This condition may be diagnosed based on your symptoms, your medical history, and an ear exam. This condition may be treated by using ear drops to soften the earwax or by having the earwax removed by a health care  provider. Do not put any objects, including cotton swabs, into your ear. You can clean the opening of your ear canal with a washcloth or facial tissue. This information is not intended to replace advice given to you by your health care provider. Make sure you discuss any questions you have with your health care provider. Document Revised: 04/08/2019 Document Reviewed: 04/08/2019 Elsevier Patient Education  [employer].

## 2021-06-14 ENCOUNTER — Ambulatory Visit: Payer: BC Managed Care – PPO | Admitting: Family Medicine

## 2021-06-14 ENCOUNTER — Other Ambulatory Visit: Payer: Self-pay | Admitting: *Deleted

## 2021-06-14 NOTE — Telephone Encounter (Signed)
Spoke to patient she states she does not need a refill on medication ( Atorvastatin). She has enough Atorvastatin  until appointment on 07/01/21.

## 2021-06-20 ENCOUNTER — Other Ambulatory Visit: Payer: Self-pay | Admitting: Cardiology

## 2021-06-22 NOTE — Progress Notes (Signed)
HPI: Follow-up coronary artery disease.  Patient admitted December 2021 with non-ST elevation myocardial infarction.  Cardiac catheterization revealed thrombotic occlusion of small ramus intermedius which was felt to be the culprit and 50% stenosis of small PDA.  Ejection fraction was mildly reduced.  Patient had PTCA of proximal ramus intermedius.  Stent was not placed as the vessel was small.  Echocardiogram at that time showed ejection fraction 65 to 54%, grade 1 diastolic dysfunction.  Since last seen she denies dyspnea, chest pain, palpitations or syncope.  Current Outpatient Medications  Medication Sig Dispense Refill   anastrozole (ARIMIDEX) 1 MG tablet Take 1 tablet (1 mg total) by mouth daily. 90 tablet 4   aspirin EC 81 MG EC tablet Take 1 tablet (81 mg total) by mouth daily. Swallow whole. 30 tablet 11   atorvastatin (LIPITOR) 80 MG tablet Take 1 tablet (80 mg total) by mouth daily. Patient need a appointment for future refill. 90 tablet 0   CINNAMON PO Take 2 capsules by mouth daily.     Ferrous Sulfate (IRON PO) Take 65 mg by mouth daily.     hydrochlorothiazide (MICROZIDE) 12.5 MG capsule TAKE 1 CAPSULE BY MOUTH EVERY DAY 30 capsule 3   metoprolol tartrate (LOPRESSOR) 25 MG tablet TAKE 0.5 TABLETS BY MOUTH 2 TIMES DAILY. 90 tablet 1   Multiple Vitamin (MULTIVITAMIN) tablet Take 1 tablet by mouth daily.     nitroGLYCERIN (NITROSTAT) 0.4 MG SL tablet Place 1 tablet (0.4 mg total) under the tongue every 5 (five) minutes x 3 doses as needed for chest pain. 25 tablet 5   ranolazine (RANEXA) 500 MG 12 hr tablet TAKE 1 TABLET BY MOUTH TWICE A DAY 180 tablet 3   No current facility-administered medications for this visit.     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   Depression    Hip pain    Hypertension    Obesity    PONV (postoperative nausea and vomiting)    Prediabetes    Swelling    bilat LE   Wears glasses      Past Surgical History:  Procedure Laterality Date   CHOLECYSTECTOMY     COLONOSCOPY     CORONARY BALLOON ANGIOPLASTY N/A 12/11/2019   Procedure: CORONARY BALLOON ANGIOPLASTY;  Surgeon: [name], MD;  Location: [facility];  Service: Cardiovascular;  Laterality: N/A;   DILATION AND CURETTAGE OF UTERUS     GASTRIC BYPASS     KNEE SURGERY  2002   Ligament repair   LEFT HEART CATH AND CORONARY ANGIOGRAPHY N/A 12/11/2019   Procedure: LEFT HEART CATH AND CORONARY ANGIOGRAPHY;  Surgeon: [name], MD;  Location: [facility];  Service: Cardiovascular;  Laterality: N/A;   MASTECTOMY Left 2019   MASTECTOMY W/ SENTINEL NODE BIOPSY Left 10/01/2018   Procedure: LEFT TOTAL MASTECTOMY WITH SENTINEL LYMPH NODE BIOPSY AND BLUE DYE INJECTION;  Surgeon: Fanny Skates, MD;  Location: [facility];  Service: General;  Laterality: Left;    Social History   Socioeconomic History   Marital status: Divorced    Spouse name: Not on file   Number of children: Not on file   Years of education: Not on file   Highest education level: Not on file  Occupational History   Not on file  Tobacco Use   Smoking status: Never   Smokeless tobacco: Never  Vaping Use  Vaping Use: Never used  Substance and Sexual Activity   Alcohol use: Yes    Comment: occasional wine   Drug use: No   Sexual activity: Not Currently  Other Topics Concern   Not on file  Social History Narrative   Not on file   Social Determinants of Health   Financial Resource Strain: Not on file  Food Insecurity: Not on file  Transportation Needs: Not on file  Physical Activity: Not on file  Stress: Not on file  Social Connections: Not on file  Intimate Partner Violence: Not on file    Family History  Problem Relation Age of Onset   Diabetes Mother    Hypertension Mother    Heart disease Mother    Stroke Mother    Kidney disease Mother    Obesity Mother    Cancer Maternal Aunt        lung   Cancer  Maternal Uncle        lung   Throat cancer Maternal Aunt     ROS: no fevers or chills, productive cough, hemoptysis, dysphasia, odynophagia, melena, hematochezia, dysuria, hematuria, rash, seizure activity, orthopnea, PND, pedal edema, claudication. Remaining systems are negative.  Physical Exam: Well-developed obese in no acute distress.  Skin is warm and dry.  HEENT is normal.  Neck is supple.  Chest is clear to auscultation with normal expansion.  Cardiovascular exam is regular rate and rhythm.  Abdominal exam nontender or distended. No masses palpated. Extremities show no edema. neuro grossly intact  ECG-sinus bradycardia, personally reviewed  A/P  1 coronary artery disease-continue medical therapy with aspirin and statin.  She is not having chest pain.  Discontinue Ranexa.  2 hypertension-patient's blood pressure is controlled.  Continue present medical regimen.  Check potassium and renal function.  3 hyperlipidemia-continue statin.  Check lipids and liver.  Kirk Ruths, MD

## 2021-06-28 ENCOUNTER — Other Ambulatory Visit: Payer: Self-pay | Admitting: Obstetrics and Gynecology

## 2021-06-28 DIAGNOSIS — Z1231 Encounter for screening mammogram for malignant neoplasm of breast: Secondary | ICD-10-CM

## 2021-07-01 ENCOUNTER — Ambulatory Visit: Payer: BC Managed Care – PPO | Admitting: Cardiology

## 2021-07-01 ENCOUNTER — Encounter: Payer: Self-pay | Admitting: Cardiology

## 2021-07-01 VITALS — BP 140/92 | HR 59 | Ht 66.0 in | Wt 267.8 lb

## 2021-07-01 DIAGNOSIS — I2510 Atherosclerotic heart disease of native coronary artery without angina pectoris: Secondary | ICD-10-CM

## 2021-07-01 DIAGNOSIS — R7303 Prediabetes: Secondary | ICD-10-CM

## 2021-07-01 DIAGNOSIS — E7800 Pure hypercholesterolemia, unspecified: Secondary | ICD-10-CM | POA: Diagnosis not present

## 2021-07-01 DIAGNOSIS — I10 Essential (primary) hypertension: Secondary | ICD-10-CM

## 2021-07-01 LAB — HEMOGLOBIN A1C
Est. average glucose Bld gHb Est-mCnc: 120 mg/dL
Hgb A1c MFr Bld: 5.8 % — ABNORMAL HIGH (ref 4.8–5.6)

## 2021-07-01 MED ORDER — ATORVASTATIN CALCIUM 80 MG PO TABS: 80.0000 mg | ORAL_TABLET | Freq: Every day | ORAL | 3 refills | Status: DC

## 2021-07-01 NOTE — Addendum Note (Signed)
Addended by: Kathyrn Lass on: 07/01/2021 09:20 AM   Modules accepted: Orders

## 2021-07-01 NOTE — Patient Instructions (Signed)
Medication Instructions:  Stop Ranexa Continue all other medications *If you need a refill on your cardiac medications before your next appointment, please call your pharmacy*   Lab Work: Bmet,lipid and hepatic panels today   Testing/Procedures: None ordered   Follow-Up: At [facility], you and your health needs are our priority.  As part of our continuing mission to provide you with exceptional heart care, we have created designated Provider Care Teams.  These Care Teams include your primary Cardiologist (physician) and Advanced Practice Providers (APPs -  Physician Assistants and Nurse Practitioners) who all work together to provide you with the care you need, when you need it.  We recommend signing up for the patient portal called "MyChart".  Sign up information is provided on this After Visit Summary.  MyChart is used to connect with patients for Virtual Visits (Telemedicine).  Patients are able to view lab/test results, encounter notes, upcoming appointments, etc.  Non-urgent messages can be sent to your provider as well.   To learn more about what you can do with MyChart, go to [email or web address].    Your next appointment:  1 year    The format for your next appointment: Office   Provider:  Dr.Crenshaw   Important Information About Sugar

## 2021-07-02 LAB — HEPATIC FUNCTION PANEL
ALT: 22 IU/L (ref 0–32)
AST: 26 IU/L (ref 0–40)
Albumin: 3.7 g/dL — ABNORMAL LOW (ref 3.8–4.9)
Alkaline Phosphatase: 104 IU/L (ref 44–121)
Bilirubin Total: 0.6 mg/dL (ref 0.0–1.2)
Bilirubin, Direct: 0.23 mg/dL (ref 0.00–0.40)
Total Protein: 6.7 g/dL (ref 6.0–8.5)

## 2021-07-02 LAB — LIPID PANEL
Chol/HDL Ratio: 1.7 ratio (ref 0.0–4.4)
Cholesterol, Total: 120 mg/dL (ref 100–199)
HDL: 70 mg/dL (ref >39)
LDL Chol Calc (NIH): 39 mg/dL (ref 0–99)
Triglycerides: 45 mg/dL (ref 0–149)
VLDL Cholesterol Cal: 11 mg/dL (ref 5–40)

## 2021-07-02 LAB — BASIC METABOLIC PANEL
BUN/Creatinine Ratio: 15 (ref 9–23)
BUN: 15 mg/dL (ref 6–24)
CO2: 26 mmol/L (ref 20–29)
Calcium: 9.2 mg/dL (ref 8.7–10.2)
Chloride: 102 mmol/L (ref 96–106)
Creatinine, Ser: 0.99 mg/dL (ref 0.57–1.00)
Glucose: 98 mg/dL (ref 70–99)
Potassium: 4 mmol/L (ref 3.5–5.2)
Sodium: 141 mmol/L (ref 134–144)
eGFR: 66 mL/min/{1.73_m2} (ref >59)

## 2021-07-04 ENCOUNTER — Encounter: Payer: Self-pay | Admitting: Internal Medicine

## 2021-07-14 ENCOUNTER — Encounter: Payer: Self-pay | Admitting: Internal Medicine

## 2021-07-27 ENCOUNTER — Other Ambulatory Visit: Payer: Self-pay

## 2021-07-27 DIAGNOSIS — C50212 Malignant neoplasm of upper-inner quadrant of left female breast: Secondary | ICD-10-CM

## 2021-07-28 ENCOUNTER — Encounter: Payer: Self-pay | Admitting: Internal Medicine

## 2021-07-29 ENCOUNTER — Encounter: Payer: Self-pay | Admitting: Hematology and Oncology

## 2021-07-29 ENCOUNTER — Inpatient Hospital Stay: Payer: BC Managed Care – PPO | Attending: Hematology and Oncology

## 2021-07-29 ENCOUNTER — Ambulatory Visit
Admission: RE | Admit: 2021-07-29 | Discharge: 2021-07-29 | Disposition: A | Discharge status: Home / Self Care | Payer: BC Managed Care – PPO | Source: Ambulatory Visit | Attending: Obstetrics and Gynecology | Admitting: Obstetrics and Gynecology

## 2021-07-29 ENCOUNTER — Other Ambulatory Visit: Payer: Self-pay

## 2021-07-29 ENCOUNTER — Inpatient Hospital Stay (HOSPITAL_BASED_OUTPATIENT_CLINIC_OR_DEPARTMENT_OTHER): Payer: BC Managed Care – PPO | Admitting: Hematology and Oncology

## 2021-07-29 VITALS — BP 148/75 | HR 60 | Temp 97.9°F | Resp 16 | Ht 66.0 in | Wt 269.6 lb

## 2021-07-29 DIAGNOSIS — Z9012 Acquired absence of left breast and nipple: Secondary | ICD-10-CM | POA: Insufficient documentation

## 2021-07-29 DIAGNOSIS — Z79811 Long term (current) use of aromatase inhibitors: Secondary | ICD-10-CM | POA: Insufficient documentation

## 2021-07-29 DIAGNOSIS — Z79899 Other long term (current) drug therapy: Secondary | ICD-10-CM | POA: Diagnosis not present

## 2021-07-29 DIAGNOSIS — C50212 Malignant neoplasm of upper-inner quadrant of left female breast: Secondary | ICD-10-CM

## 2021-07-29 DIAGNOSIS — Z170 Estrogen receptor positive status [ER+]: Secondary | ICD-10-CM | POA: Insufficient documentation

## 2021-07-29 DIAGNOSIS — Z1231 Encounter for screening mammogram for malignant neoplasm of breast: Secondary | ICD-10-CM

## 2021-07-29 DIAGNOSIS — D509 Iron deficiency anemia, unspecified: Secondary | ICD-10-CM

## 2021-07-29 DIAGNOSIS — Z7982 Long term (current) use of aspirin: Secondary | ICD-10-CM | POA: Diagnosis not present

## 2021-07-29 DIAGNOSIS — Z9884 Bariatric surgery status: Secondary | ICD-10-CM | POA: Insufficient documentation

## 2021-07-29 LAB — CBC WITH DIFFERENTIAL (CANCER CENTER ONLY)
Abs Immature Granulocytes: 0.01 10*3/uL (ref 0.00–0.07)
Basophils Absolute: 0 10*3/uL (ref 0.0–0.1)
Basophils Relative: 1 %
Eosinophils Absolute: 0 10*3/uL (ref 0.0–0.5)
Eosinophils Relative: 1 %
HCT: 39.4 % (ref 36.0–46.0)
Hemoglobin: 13.4 g/dL (ref 12.0–15.0)
Immature Granulocytes: 0 %
Lymphocytes Relative: 37 %
Lymphs Abs: 1.7 10*3/uL (ref 0.7–4.0)
MCH: 28.6 pg (ref 26.0–34.0)
MCHC: 34 g/dL (ref 30.0–36.0)
MCV: 84.2 fL (ref 80.0–100.0)
Monocytes Absolute: 0.4 10*3/uL (ref 0.1–1.0)
Monocytes Relative: 9 %
Neutro Abs: 2.4 10*3/uL (ref 1.7–7.7)
Neutrophils Relative %: 52 %
Platelet Count: 194 10*3/uL (ref 150–400)
RBC: 4.68 MIL/uL (ref 3.87–5.11)
RDW: 13.1 % (ref 11.5–15.5)
WBC Count: 4.7 10*3/uL (ref 4.0–10.5)
nRBC: 0 % (ref 0.0–0.2)

## 2021-07-29 LAB — CMP (CANCER CENTER ONLY)
ALT: 19 U/L (ref 0–44)
AST: 22 U/L (ref 15–41)
Albumin: 3.5 g/dL (ref 3.5–5.0)
Alkaline Phosphatase: 89 U/L (ref 38–126)
Anion gap: 5 (ref 5–15)
BUN: 13 mg/dL (ref 6–20)
CO2: 27 mmol/L (ref 22–32)
Calcium: 8.8 mg/dL — ABNORMAL LOW (ref 8.9–10.3)
Chloride: 108 mmol/L (ref 98–111)
Creatinine: 1.2 mg/dL — ABNORMAL HIGH (ref 0.44–1.00)
GFR, Estimated: 52 mL/min — ABNORMAL LOW (ref >60)
Glucose, Bld: 124 mg/dL — ABNORMAL HIGH (ref 70–99)
Potassium: 3.5 mmol/L (ref 3.5–5.1)
Sodium: 140 mmol/L (ref 135–145)
Total Bilirubin: 0.7 mg/dL (ref 0.3–1.2)
Total Protein: 7 g/dL (ref 6.5–8.1)

## 2021-07-29 NOTE — Progress Notes (Signed)
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 Roberta [name] is here for follow-up by herself.  Since last visit, she has been taking anastrozole as recommended.  She denies any new health complaints.  She was hoping to lose about 60 pounds of weight before she turns 58.  She denies any changes to her breast.  She had a mammogram of the right breast this morning.  No new cough, chest pain, shortness of breath, change in bowel habits or urinary habits.  Rest of the pertinent 10 point ROS reviewed and negative   COVID 19 VACCINATION STATUS: Status post Pfizer x4 as of September 2022   HISTORY OF CURRENT ILLNESS:  From the original intake note:  Roberta [name] had routine screening mammography on 07/25/2018 which was normal.  Less than a month later she was found by Dr. Terri Piedra to have palpable lumps in the superior left breast at a clinic exam.  Roberta [name] underwent unilateral left diagnostic mammography with tomography and left breast ultrasonography at [facility] on 08/23/2018 showing: Breast Density Category C. No suspicious masses are seen on the tomosynthesis images of the left breast. There are 2 groups of indeterminate calcifications however, one in the superior anterior  superficial left breast which spans 8 mm. In the upper inner left breast, there is a 2.2 cm area of loosely grouped amorphous calcifications. Normal fibroglandular tissue in the upper inner and upper outer quadrants of the left breast.  There were no sonographic abnormalities to correspond with the masses identified on the clinical breast exam.  Accordingly on 08/29/2018 she proceeded to stereotactic biopsy of the left breast area in question. The pathology from this procedure showed ([id number]): invasive mammary carcinoma, nottingham grade 2 of 3. Prognostic indicators significant for: estrogen receptor, 100% positive and progesterone receptor, 2% positive, both with strong staining intensity. Proliferation marker Ki67 at 10%. HER2 negative (0+) by immunohistochemistry.  An additional biopsy of the left breast was performed on the same day. The pathology from this procedure showed ([id number]): ductal carcinoma in situ. Prognostic indicators significant for: estrogen receptor, 95% positie and progesterone receptor, 100% positive, both with strong staining intensity.  Finally, she underwent a left axillary ultrasound on 09/02/2018 showing: No left axillary adenopathy.  The patient's subsequent history is as detailed below.  PAST MEDICAL HISTORY: Past Medical History:  Diagnosis Date   Back pain    Cancer ([place])    multifocal left breast   Depression    Hip pain    Hypertension    Obesity    PONV (postoperative nausea and vomiting)    Prediabetes    Swelling    bilat LE   Wears glasses     PAST SURGICAL HISTORY: Past Surgical History:  Procedure Laterality Date   CHOLECYSTECTOMY     COLONOSCOPY     CORONARY BALLOON ANGIOPLASTY N/A 12/11/2019   Procedure: CORONARY BALLOON  ANGIOPLASTY;  Surgeon: [name], MD;  Location: [facility];  Service: Cardiovascular;  Laterality: N/A;   DILATION AND CURETTAGE OF UTERUS     GASTRIC BYPASS     KNEE SURGERY  2002   Ligament repair    LEFT HEART CATH AND CORONARY ANGIOGRAPHY N/A 12/11/2019   Procedure: LEFT HEART CATH AND CORONARY ANGIOGRAPHY;  Surgeon: [name], MD;  Location: [facility];  Service: Cardiovascular;  Laterality: N/A;   MASTECTOMY Left 2019   MASTECTOMY W/ SENTINEL NODE BIOPSY Left 10/01/2018   Procedure: LEFT TOTAL MASTECTOMY WITH SENTINEL LYMPH NODE BIOPSY AND BLUE DYE INJECTION;  Surgeon: Fanny Skates, MD;  Location: [facility];  Service: General;  Laterality: Left;    FAMILY HISTORY: Family History  Problem Relation Age of Onset   Diabetes Mother    Hypertension Mother    Heart disease Mother    Stroke Mother    Kidney disease Mother    Obesity Mother    Cancer Maternal Aunt        lung   Cancer Maternal Uncle        lung   Throat cancer Maternal Aunt   Roberta [name]'s father died in his mid 55s from alcoholism.  The patient's mother died at age 64 from end-stage renal disease.  The patient had 2 brothers, 3 sisters, one brother dying from complications of diabetes.  There is no cancer in the immediate family.  There are 2 maternal uncles and 2 maternal aunts who died from lung cancer, all of whom smoked.  A maternal aunt also had a history of throat cancer.   GYNECOLOGIC HISTORY:  Patient's last menstrual period was 09/04/2018 (exact date). 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  She is divorced, lives alone.   ADVANCED DIRECTIVES: She intends to name her sister [name] as healthcare power of attorney.  [name] can be reached at [phone].   HEALTH MAINTENANCE: Social History   Tobacco Use   Smoking status: Never   Smokeless tobacco: Never  Vaping Use   Vaping Use: Never used  Substance Use Topics    Alcohol use: Yes    Comment: occasional wine   Drug use: No    Colonoscopy: August 2020  PAP: Up-to-date  Bone density: 2005   Allergies  Allergen Reactions   Lisinopril Swelling    Swelling of the tongue   Penicillins     UNSPECIFIED CHILDHOOD REACTION   Imdur [Isosorbide Nitrate] Other (See Comments)    headache   Norvasc [Amlodipine Besylate] Rash   Red Dye Rash   Tetanus Toxoids Swelling    Swelling at site of injection    Current Outpatient Medications  Medication Sig Dispense Refill   anastrozole (ARIMIDEX) 1 MG tablet Take 1 tablet (1 mg total) by mouth daily. 90 tablet 4   aspirin EC 81 MG EC tablet Take 1 tablet (81 mg total) by mouth daily. Swallow whole. 30 tablet 11   atorvastatin (LIPITOR) 80 MG tablet Take 1 tablet (80 mg total) by mouth daily. 90 tablet 3   CINNAMON PO Take 2 capsules by mouth daily.     Ferrous Sulfate (IRON PO) Take 65 mg by mouth daily.     hydrochlorothiazide (MICROZIDE)  12.5 MG capsule TAKE 1 CAPSULE BY MOUTH EVERY DAY 30 capsule 3   metoprolol tartrate (LOPRESSOR) 25 MG tablet TAKE 0.5 TABLETS BY MOUTH 2 TIMES DAILY. 90 tablet 1   Multiple Vitamin (MULTIVITAMIN) tablet Take 1 tablet by mouth daily.     nitroGLYCERIN (NITROSTAT) 0.4 MG SL tablet Place 1 tablet (0.4 mg total) under the tongue every 5 (five) minutes x 3 doses as needed for chest pain. 25 tablet 5   No current facility-administered medications for this visit.    OBJECTIVE: African-American woman in no acute distress  Vitals:   07/29/21 1336  BP: (!) 148/75  Pulse: 60  Resp: 16  Temp: 97.9 F (36.6 C)  SpO2: 100%    Wt Readings from Last 3 Encounters:  07/29/21 269 lb 9.6 oz (122.3 kg)  07/01/21 267 lb 12.8 oz (121.5 kg)  05/23/21 269 lb (122 kg)   Body mass index is 43.51 kg/m.    ECOG FS:1 - Symptomatic but completely ambulatory  Physical Exam Constitutional:      Appearance: Normal appearance.  Chest:     Comments: Status post left mastectomy.  No  evidence of recurrence in the left mastectomy bed.  Right breast with no evidence of masses.  No palpable regional adenopathy. Musculoskeletal:     Cervical back: Normal range of motion and neck supple. No rigidity.  Lymphadenopathy:     Cervical: No cervical adenopathy.  Neurological:     Mental Status: She is alert.      LAB RESULTS:  CMP     Component Value Date/Time   NA 140 07/29/2021 1302   NA 141 07/01/2021 0949   K 3.5 07/29/2021 1302   CL 108 07/29/2021 1302   CO2 27 07/29/2021 1302   GLUCOSE 124 (H) 07/29/2021 1302   BUN 13 07/29/2021 1302   BUN 15 07/01/2021 0949   CREATININE 1.20 (H) 07/29/2021 1302   CALCIUM 8.8 (L) 07/29/2021 1302   PROT 7.0 07/29/2021 1302   PROT 6.7 07/01/2021 0949   ALBUMIN 3.5 07/29/2021 1302   ALBUMIN 3.7 (L) 07/01/2021 0949   AST 22 07/29/2021 1302   ALT 19 07/29/2021 1302   ALKPHOS 89 07/29/2021 1302   BILITOT 0.7 07/29/2021 1302   GFRNONAA 52 (L) 07/29/2021 1302   GFRAA >60 09/23/2019 1441   GFRAA >60 09/04/2018 0840    No results found for: "TOTALPROTELP", "ALBUMINELP", "A1GS", "A2GS", "BETS", "BETA2SER", "GAMS", "MSPIKE", "SPEI"  No results found for: "KPAFRELGTCHN", "LAMBDASER", "KAPLAMBRATIO"  Lab Results  Component Value Date   WBC 4.7 07/29/2021   NEUTROABS 2.4 07/29/2021   HGB 13.4 07/29/2021   HCT 39.4 07/29/2021   MCV 84.2 07/29/2021   PLT 194 07/29/2021    No results found for: "LABCA2"  No components found for: "LXBWIO035"  No results for input(s): "INR" in the last 168 hours.  No results found for: "LABCA2"  No results found for: "DHR416"  No results found for: "CAN125"  No results found for: "CAN153"  No results found for: "CA2729"  No components found for: "HGQUANT"  No results found for: "CEA1", "CEA" / No results found for: "CEA1", "CEA"   No results found for: "AFPTUMOR"  No results found for: "CHROMOGRNA"  No results found for: "HGBA", "HGBA2QUANT", "HGBFQUANT",  "HGBSQUAN" (Hemoglobinopathy evaluation)   No results found for: "LDH"  Lab Results  Component Value Date   IRON 23 (L) 11/09/2020   TIBC 359.8 11/09/2020   IRONPCTSAT 6.4 (L) 11/09/2020   (Iron and TIBC)  Lab Results  Component Value Date   FERRITIN 8.6 (L) 11/09/2020    Urinalysis    Component Value Date/Time   COLORURINE YELLOW 11/09/2020 1614   APPEARANCEUR Sl Cloudy (A) 11/09/2020 1614   LABSPEC >=1.030 (A) 11/09/2020 1614   PHURINE 5.5 11/09/2020 1614   GLUCOSEU NEGATIVE 11/09/2020 1614   HGBUR NEGATIVE 11/09/2020 1614   BILIRUBINUR NEGATIVE 11/09/2020 1614   KETONESUR NEGATIVE 11/09/2020 1614   PROTEINUR 100 (A) 06/25/2007 0640   UROBILINOGEN 1.0 11/09/2020 1614   NITRITE NEGATIVE 11/09/2020 1614   LEUKOCYTESUR MODERATE (A) 11/09/2020 1614    STUDIES:  No results found.   ELIGIBLE FOR AVAILABLE RESEARCH PROTOCOL: no   ASSESSMENT: 56 y.o. [place], [place] woman status post left breast upper inner quadrant biopsy for a clinical T2 N0, stage IB invasive ductal carcinoma, grade 2, estrogen and progesterone receptor positive, HER-2 not amplified, with an MIB-1-1 of 2%  (a) a second biopsy same day different quadrant showed ductal carcinoma in situ  (1) left breast s/p mastectomy on 10/01/2018 for a  pT1b pN0, stage IA invasive ductal carcinoma, grade 2, margins negative.  (a) a total of 5 lymph nodes biopsied were negative.    (2) Oncotype score of 20 predicts a risk of recurrence outside the breast within the next 9 years of 6% if her only systemic therapy is antiestrogens for 5 years.  Also predicts no benefit from chemotherapy.  (3) adjuvant radiation not indicated  (4)  tamoxifen started November 2020  (a) switched to anastrozole beginning March 2022 following NSTEMI and angioplasty December 2021   PLAN:  She currently continues on adjuvant anastrozole and has been able to tolerate it well except for some hot flashes and lack of sex drive.  She however  does not want to try any medication for the hot flashes. Physical examination today without any new concerns.  She had right breast mammogram this morning, results pending. She will continue antiestrogen therapy till November 2025. Encouraged weight loss regimen as she planned, intermittent fasting and regular exercise. Ordered baseline bone density.  Encouraged to continue on calcium/vitamin D supplements and weightbearing exercise as tolerated Anemia has resolved since last visit, okay to continue iron twice a week. Return to clinic in 1 year or sooner as needed Total time spent: 30 minutes *Total Encounter Time as defined by the Centers for Medicare and Medicaid Services includes, in addition to the face-to-face time of a patient visit (documented in the note above) non-face-to-face time: obtaining and reviewing outside history, ordering and reviewing medications, tests or procedures, care coordination (communications with other health care professionals or caregivers) and documentation in the medical record.

## 2021-07-31 NOTE — Progress Notes (Unsigned)
    Subjective:    Patient ID: Roberta [name], female    DOB: 1965-05-29, 56 y.o.   MRN: [id number]      HPI Tamsen is here for No chief complaint on file.   Diarrhea, stomach pain -  started one week ago.  Diarrhea got better, but then worse.   Taking pepto-bismol and activated charcoal.      Medications and allergies reviewed with patient and updated if appropriate.  Current Outpatient Medications on File Prior to Visit  Medication Sig Dispense Refill   anastrozole (ARIMIDEX) 1 MG tablet Take 1 tablet (1 mg total) by mouth daily. 90 tablet 4   aspirin EC 81 MG EC tablet Take 1 tablet (81 mg total) by mouth daily. Swallow whole. 30 tablet 11   atorvastatin (LIPITOR) 80 MG tablet Take 1 tablet (80 mg total) by mouth daily. 90 tablet 3   CINNAMON PO Take 2 capsules by mouth daily.     Ferrous Sulfate (IRON PO) Take 65 mg by mouth daily.     hydrochlorothiazide (MICROZIDE) 12.5 MG capsule TAKE 1 CAPSULE BY MOUTH EVERY DAY 30 capsule 3   metoprolol tartrate (LOPRESSOR) 25 MG tablet TAKE 0.5 TABLETS BY MOUTH 2 TIMES DAILY. 90 tablet 1   Multiple Vitamin (MULTIVITAMIN) tablet Take 1 tablet by mouth daily.     nitroGLYCERIN (NITROSTAT) 0.4 MG SL tablet Place 1 tablet (0.4 mg total) under the tongue every 5 (five) minutes x 3 doses as needed for chest pain. 25 tablet 5   No current facility-administered medications on file prior to visit.    Review of Systems     Objective:  There were no vitals filed for this visit. BP Readings from Last 3 Encounters:  07/29/21 (!) 148/75  07/01/21 (!) 140/92  05/23/21 (!) 142/80   Wt Readings from Last 3 Encounters:  07/29/21 269 lb 9.6 oz (122.3 kg)  07/01/21 267 lb 12.8 oz (121.5 kg)  05/23/21 269 lb (122 kg)   There is no height or weight on file to calculate BMI.    Physical Exam         Assessment & Plan:    See Problem List for Assessment and Plan of chronic medical problems.

## 2021-08-01 ENCOUNTER — Encounter: Payer: Self-pay | Admitting: Internal Medicine

## 2021-08-01 ENCOUNTER — Ambulatory Visit: Payer: BC Managed Care – PPO | Admitting: Internal Medicine

## 2021-08-01 VITALS — BP 144/76 | HR 66 | Temp 97.9°F | Ht 66.0 in | Wt 270.0 lb

## 2021-08-01 DIAGNOSIS — R197 Diarrhea, unspecified: Secondary | ICD-10-CM | POA: Diagnosis not present

## 2021-08-01 DIAGNOSIS — R109 Unspecified abdominal pain: Secondary | ICD-10-CM | POA: Diagnosis not present

## 2021-08-01 DIAGNOSIS — I10 Essential (primary) hypertension: Secondary | ICD-10-CM | POA: Diagnosis not present

## 2021-08-01 LAB — BASIC METABOLIC PANEL
BUN: 16 mg/dL (ref 6–23)
CO2: 30 mEq/L (ref 19–32)
Calcium: 9 mg/dL (ref 8.4–10.5)
Chloride: 104 mEq/L (ref 96–112)
Creatinine, Ser: 0.92 mg/dL (ref 0.40–1.20)
GFR: 68.7 mL/min (ref >60.00)
Glucose, Bld: 98 mg/dL (ref 70–99)
Potassium: 3.5 mEq/L (ref 3.5–5.1)
Sodium: 140 mEq/L (ref 135–145)

## 2021-08-01 NOTE — Assessment & Plan Note (Signed)
Chronic Blood pressure elevated here today and typically is elevated at the doctor's office She states that is well controlled at home Advised bringing her blood pressure cuff to our office at her next appointment-she has done that in the past and her blood pressure cuff was accurate No change in medications at this time Continue hydrochlorothiazide 12.5 mg daily, metoprolol 12.5 mg twice daily

## 2021-08-01 NOTE — Assessment & Plan Note (Signed)
Acute Started 1 week ago Associated with abdominal cramping Stool has changed a little bit because she is having occasional solid stool, but has also taken Pepto-Bismol and activated charcoal She is tender on exam Recent trip to [place]  Infectious diarrhea We will check stool studies BMP-reviewed recent CMP and CBC CT abdomen pelvis Increase fluids, brat diet

## 2021-08-01 NOTE — Assessment & Plan Note (Signed)
Acute Started 1 week ago with diarrhea Now having alternating stools between diarrhea and solid stool, but has been taking activated charcoal intermittently Having abdominal cramping Tender on exam and right upper quadrant and left lower quadrant ?  Infectious diarrhea-possible, but symptoms did not start until 11 days after she returned from the cruise so seems less likely ?Colitis-she is tender on exam so we will go ahead and get a CT of the abdomen and pelvis Unlikely hepatitis since liver tests were normal 3 days ago ?  Gastroenteritis-possible Stressed increasing fluids, will hold off on antibiotics for now CT of the abdomen and pelvis, BMP, stool studies Advised to stop activated charcoal She will update me with her symptoms

## 2021-08-01 NOTE — Patient Instructions (Addendum)
     Blood work was ordered.     Medications changes include :   stop the activated charcoal   A Ct scan was ordered.    Return if symptoms worsen or fail to improve.

## 2021-08-02 ENCOUNTER — Ambulatory Visit (HOSPITAL_BASED_OUTPATIENT_CLINIC_OR_DEPARTMENT_OTHER): Admission: RE | Admit: 2021-08-02 | Payer: BC Managed Care – PPO | Source: Ambulatory Visit

## 2021-08-10 ENCOUNTER — Encounter (INDEPENDENT_AMBULATORY_CARE_PROVIDER_SITE_OTHER): Payer: Self-pay

## 2021-09-30 ENCOUNTER — Ambulatory Visit: Payer: BC Managed Care – PPO | Admitting: Hematology and Oncology

## 2021-09-30 ENCOUNTER — Other Ambulatory Visit: Payer: BC Managed Care – PPO

## 2021-10-14 ENCOUNTER — Encounter: Payer: Self-pay | Admitting: Hematology and Oncology

## 2021-10-14 ENCOUNTER — Other Ambulatory Visit: Payer: Self-pay | Admitting: *Deleted

## 2021-10-14 MED ORDER — ANASTROZOLE 1 MG PO TABS: 1.0000 mg | ORAL_TABLET | Freq: Every day | ORAL | 4 refills | Status: DC

## 2021-11-28 ENCOUNTER — Encounter: Payer: Self-pay | Admitting: Internal Medicine

## 2021-11-29 ENCOUNTER — Ambulatory Visit
Admission: RE | Admit: 2021-11-29 | Discharge: 2021-11-29 | Disposition: A | Discharge status: Home / Self Care | Payer: BC Managed Care – PPO | Source: Ambulatory Visit | Attending: Internal Medicine | Admitting: Internal Medicine

## 2021-11-29 VITALS — BP 136/83 | HR 74 | Temp 98.4°F | Resp 18

## 2021-11-29 DIAGNOSIS — J069 Acute upper respiratory infection, unspecified: Secondary | ICD-10-CM | POA: Diagnosis not present

## 2021-11-29 DIAGNOSIS — U071 COVID-19: Secondary | ICD-10-CM | POA: Insufficient documentation

## 2021-11-29 LAB — RESP PANEL BY RT-PCR (FLU A&B, COVID) ARPGX2
Influenza A by PCR: NEGATIVE
Influenza B by PCR: NEGATIVE
SARS Coronavirus 2 by RT PCR: POSITIVE — AB

## 2021-11-29 MED ORDER — BENZONATATE 100 MG PO CAPS: 100.0000 mg | ORAL_CAPSULE | Freq: Three times a day (TID) | ORAL | 0 refills | Status: DC | PRN

## 2021-11-29 MED ORDER — FLUTICASONE PROPIONATE 50 MCG/ACT NA SUSP: 1.0000 | Freq: Every day | NASAL | 0 refills | Status: DC

## 2021-11-29 NOTE — ED Provider Notes (Signed)
[facility]    CSN: [id number] Arrival date & time: 11/29/21  1457      History   Chief Complaint Chief Complaint  Patient presents with   URI    HPI Laurieanne Galloway is a 56 y.o. female.   Patient presents with nasal congestion, sore throat, headache, cough, sneezing that started yesterday.  She has been taking DayQuil and NyQuil with minimal improvement.  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  Denies any known fevers at home.  Denies chest pain, shortness of breath, nausea, vomiting, diarrhea, abdominal pain.  Denies history of asthma or COPD and patient is not a cigarette smoker.   URI   Past Medical History:  Diagnosis Date   Back pain    Cancer ([place])    multifocal left breast   Depression    Hip pain    Hypertension    Obesity    PONV (postoperative nausea and vomiting)    Prediabetes    Swelling    bilat LE   Wears glasses     Patient Active Problem List   Diagnosis Date Noted   Diarrhea of presumed infectious origin 08/01/2021   Impacted cerumen, right ear 05/23/2021   Decreased hearing of right ear 05/23/2021   Patellar tendinitis, right knee 04/14/2021   Patellar pain, right 04/12/2021   Abdominal pain 06/22/2020   History of breast cancer 12/23/2019   At risk for diabetes mellitus 12/23/2019   CAD S/P percutaneous coronary angioplasty 12/12/2019   NSTEMI (non-ST elevated myocardial infarction) ([place]) 12/11/2019   S/P gastric bypass 12/04/2019   At risk for deficient intake of food 12/04/2019   Premenstrual dysphoric disorder 12/22/2018   Anxiety and depression 12/10/2018   Morbid obesity with BMI of 40.0-44.9, adult ([place]) 09/04/2018   Malignant neoplasm of upper-inner quadrant of left breast in female, estrogen receptor positive ([place]) 09/02/2018   Vitamin D deficiency, taking vit D 5000 IU daily 02/04/2018   Urticaria 01/17/2018   Weight gain following gastric  bypass surgery 06/25/2017   Hyperhidrosis of axilla 06/25/2017   Left hip pain 02/10/2017   Microcytic anemia, mild 01/21/2017   Malabsorption, due to gastic bypass 2005 12/13/2015   Prediabetes 12/13/2015   Class 3 severe obesity with serious comorbidity and body mass index (BMI) of 40.0 to 44.9 in adult ([place]) 12/13/2015   Leg edema, L > R 12/13/2015   Dense breasts 12/13/2011   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, surgeons: Truitt Leep and Lucia Gaskins, high weight 400, low weight 225 11/10/2011   Hypertension 11/10/2011    Past Surgical History:  Procedure Laterality Date   CHOLECYSTECTOMY     COLONOSCOPY     CORONARY BALLOON ANGIOPLASTY N/A 12/11/2019   Procedure: CORONARY BALLOON ANGIOPLASTY;  Surgeon: [name], MD;  Location: [facility];  Service: Cardiovascular;  Laterality: N/A;   DILATION AND CURETTAGE OF UTERUS     GASTRIC BYPASS     KNEE SURGERY  2002   Ligament repair   LEFT HEART CATH AND CORONARY ANGIOGRAPHY N/A 12/11/2019   Procedure: LEFT HEART CATH AND CORONARY ANGIOGRAPHY;  Surgeon: [name], MD;  Location: [facility];  Service: Cardiovascular;  Laterality: N/A;   MASTECTOMY Left 2019   MASTECTOMY W/ SENTINEL NODE BIOPSY Left 10/01/2018   Procedure: LEFT TOTAL MASTECTOMY WITH SENTINEL LYMPH NODE BIOPSY AND BLUE DYE INJECTION;  Surgeon: Fanny Skates, MD;  Location: [facility]  OR;  Service: General;  Laterality: Left;    OB History     Gravida  0   Para  0   Term  0   Preterm  0   AB  0   Living  0      SAB  0   IAB  0   Ectopic  0   Multiple  0   Live Births  0            Home Medications    Prior to Admission medications   Medication Sig Start Date End Date Taking? Authorizing Provider  benzonatate (TESSALON) 100 MG capsule Take 1 capsule (100 mg total) by mouth every 8 (eight) hours as needed for cough. 11/29/21  Yes Jaecob Lowden, Hildred Alamin E, FNP  fluticasone (FLONASE) 50 MCG/ACT nasal spray Place 1 spray into both nostrils  daily. 11/29/21  Yes Lezlee Gills, Hildred Alamin E, FNP  anastrozole (ARIMIDEX) 1 MG tablet Take 1 tablet (1 mg total) by mouth daily. 10/14/21   Benay Pike, MD  aspirin EC 81 MG EC tablet Take 1 tablet (81 mg total) by mouth daily. Swallow whole. 12/13/19   Kroeger, Lorelee Cover., PA-C  atorvastatin (LIPITOR) 80 MG tablet Take 1 tablet (80 mg total) by mouth daily. 07/01/21   Lelon Perla, MD  CINNAMON PO Take 2 capsules by mouth daily.    [provider]  Ferrous Sulfate (IRON PO) Take 65 mg by mouth daily.    [provider]  hydrochlorothiazide (MICROZIDE) 12.5 MG capsule TAKE 1 CAPSULE BY MOUTH EVERY DAY 12/24/20   Lendon Colonel, NP  metoprolol tartrate (LOPRESSOR) 25 MG tablet TAKE 0.5 TABLETS BY MOUTH 2 TIMES DAILY. 06/21/21   Lelon Perla, MD  Multiple Vitamin (MULTIVITAMIN) tablet Take 1 tablet by mouth daily.    [provider]  nitroGLYCERIN (NITROSTAT) 0.4 MG SL tablet Place 1 tablet (0.4 mg total) under the tongue every 5 (five) minutes x 3 doses as needed for chest pain. 12/24/20   Lendon Colonel, NP    Family History Family History  Problem Relation Age of Onset   Diabetes Mother    Hypertension Mother    Heart disease Mother    Stroke Mother    Kidney disease Mother    Obesity Mother    Cancer Maternal Aunt        lung   Cancer Maternal Uncle        lung   Throat cancer Maternal Aunt     Social History Social History   Tobacco Use   Smoking status: Never   Smokeless tobacco: Never  Vaping Use   Vaping Use: Never used  Substance Use Topics   Alcohol use: Yes    Comment: occasional wine   Drug use: No     Allergies   Lisinopril, Amlodipine, Penicillins, Tetanus toxoid, Imdur [isosorbide nitrate], Norvasc [amlodipine besylate], Red dye, and Tetanus toxoids   Review of Systems Review of Systems Per HPI  Physical Exam Triage Vital Signs ED Triage Vitals  Enc Vitals Group     BP 11/29/21 1608 136/83     Pulse Rate  11/29/21 1608 74     Resp 11/29/21 1608 18     Temp 11/29/21 1608 98.4 F (36.9 C)     Temp Source 11/29/21 1608 Oral     SpO2 11/29/21 1608 96 %     Weight --      Height --      Head Circumference --  Peak Flow --      Pain Score 11/29/21 1607 3     Pain Loc --      Pain Edu? --      Excl. in [place]? --    No data found.  Updated Vital Signs BP 136/83   Pulse 74   Temp 98.4 F (36.9 C) (Oral)   Resp 18   LMP 09/04/2018 (Exact Date)   SpO2 96%   Visual Acuity Right Eye Distance:   Left Eye Distance:   Bilateral Distance:    Right Eye Near:   Left Eye Near:    Bilateral Near:     Physical Exam Constitutional:      General: She is not in acute distress.    Appearance: Normal appearance. She is not toxic-appearing or diaphoretic.  HENT:     Head: Normocephalic and atraumatic.     Right Ear: Tympanic membrane and ear canal normal.     Left Ear: Tympanic membrane and ear canal normal.     Nose: Congestion present.     Mouth/Throat:     Mouth: Mucous membranes are moist.     Pharynx: No posterior oropharyngeal erythema.  Eyes:     Extraocular Movements: Extraocular movements intact.     Conjunctiva/sclera: Conjunctivae normal.     Pupils: Pupils are equal, round, and reactive to light.  Cardiovascular:     Rate and Rhythm: Normal rate and regular rhythm.     Pulses: Normal pulses.     Heart sounds: Normal heart sounds.  Pulmonary:     Effort: Pulmonary effort is normal. No respiratory distress.     Breath sounds: Normal breath sounds. No stridor. No wheezing, rhonchi or rales.  Abdominal:     General: Abdomen is flat. Bowel sounds are normal.     Palpations: Abdomen is soft.  Musculoskeletal:        General: Normal range of motion.     Cervical back: Normal range of motion.  Skin:    General: Skin is warm and dry.  Neurological:     General: No focal deficit present.     Mental Status: She is alert and oriented to person, place, and time. Mental status  is at baseline.  Psychiatric:        Mood and Affect: Mood normal.        Behavior: Behavior normal.      UC Treatments / Results  Labs (all labs ordered are listed, but only abnormal results are displayed) Labs Reviewed  RESP PANEL BY RT-PCR (FLU A&B, COVID) [id number]    EKG   Radiology No results found.  Procedures Procedures (including critical care time)  Medications Ordered in UC Medications - No data to display  Initial Impression / Assessment and Plan / UC Course  I have reviewed the triage vital signs and the nursing notes.  Pertinent labs & imaging results that were available during my care of the patient were reviewed by me and considered in my medical decision making (see chart for details).     Patient presents with symptoms likely from a viral upper respiratory infection. Differential includes bacterial pneumonia, sinusitis, allergic rhinitis, COVID-19, flu, RSV. Do not suspect underlying cardiopulmonary process. Symptoms seem unlikely related to ACS, CHF or COPD exacerbations, pneumonia, pneumothorax. Patient is nontoxic appearing and not in need of emergent medical intervention.  COVID and flu test pending.  Recommended symptom control with over the counter medications that are safe with hypertension.  Patient sent prescriptions.  Return if symptoms fail to improve in 1-2 weeks or you develop shortness of breath, chest pain, severe headache. Patient states understanding and is agreeable.  Discharged with PCP followup.  Final Clinical Impressions(s) / UC Diagnoses   Final diagnoses:  Viral upper respiratory tract infection with cough     Discharge Instructions      You have a viral upper respiratory infection which should run its course and self resolve with symptomatic treatment.  COVID and flu test is pending.  We will call if it is positive.  I have prescribed a few medications to alleviate symptoms.  Please follow-up if symptoms persist or  worsen.    ED Prescriptions     Medication Sig Dispense Auth. Provider   fluticasone (FLONASE) 50 MCG/ACT nasal spray Place 1 spray into both nostrils daily. 16 g [place], Hildred Alamin E, [place]   benzonatate (TESSALON) 100 MG capsule Take 1 capsule (100 mg total) by mouth every 8 (eight) hours as needed for cough. 21 capsule [place], Michele Rockers, [place]      PDMP not reviewed this encounter.   Teodora Medici, [place] 11/29/21 1725

## 2021-11-29 NOTE — Discharge Instructions (Signed)
You have a viral upper respiratory infection which should run its course and self resolve with symptomatic treatment.  COVID and flu test is pending.  We will call if it is positive.  I have prescribed a few medications to alleviate symptoms.  Please follow-up if symptoms persist or worsen.

## 2021-11-29 NOTE — ED Triage Notes (Signed)
Pt presents to uc with uri symptoms since monday, sore throat, ha, eye pain, cough, congestion, sneezing. Pt has been taking otc medication for symptom management.

## 2021-12-10 ENCOUNTER — Other Ambulatory Visit: Payer: Self-pay | Admitting: Cardiology

## 2021-12-10 DIAGNOSIS — I10 Essential (primary) hypertension: Secondary | ICD-10-CM

## 2021-12-10 DIAGNOSIS — E559 Vitamin D deficiency, unspecified: Secondary | ICD-10-CM

## 2021-12-10 DIAGNOSIS — D509 Iron deficiency anemia, unspecified: Secondary | ICD-10-CM

## 2022-01-07 ENCOUNTER — Other Ambulatory Visit: Payer: Self-pay | Admitting: Cardiology

## 2022-01-09 ENCOUNTER — Encounter: Payer: Self-pay | Admitting: Internal Medicine

## 2022-01-13 ENCOUNTER — Ambulatory Visit (INDEPENDENT_AMBULATORY_CARE_PROVIDER_SITE_OTHER): Payer: BC Managed Care – PPO | Admitting: *Deleted

## 2022-01-13 DIAGNOSIS — Z23 Encounter for immunization: Secondary | ICD-10-CM | POA: Diagnosis not present

## 2022-02-03 ENCOUNTER — Encounter (HOSPITAL_COMMUNITY): Payer: Self-pay

## 2022-02-03 ENCOUNTER — Emergency Department (HOSPITAL_COMMUNITY): Payer: BC Managed Care – PPO

## 2022-02-03 ENCOUNTER — Other Ambulatory Visit: Payer: Self-pay

## 2022-02-03 ENCOUNTER — Emergency Department (HOSPITAL_COMMUNITY)
Admission: EM | Admit: 2022-02-03 | Discharge: 2022-02-03 | Disposition: A | Discharge status: Home / Self Care | Payer: BC Managed Care – PPO | Attending: Emergency Medicine | Admitting: Emergency Medicine

## 2022-02-03 DIAGNOSIS — Z79899 Other long term (current) drug therapy: Secondary | ICD-10-CM | POA: Diagnosis not present

## 2022-02-03 DIAGNOSIS — Z7982 Long term (current) use of aspirin: Secondary | ICD-10-CM | POA: Diagnosis not present

## 2022-02-03 DIAGNOSIS — I10 Essential (primary) hypertension: Secondary | ICD-10-CM | POA: Diagnosis not present

## 2022-02-03 DIAGNOSIS — R0602 Shortness of breath: Secondary | ICD-10-CM | POA: Diagnosis not present

## 2022-02-03 DIAGNOSIS — R600 Localized edema: Secondary | ICD-10-CM | POA: Insufficient documentation

## 2022-02-03 DIAGNOSIS — I2510 Atherosclerotic heart disease of native coronary artery without angina pectoris: Secondary | ICD-10-CM | POA: Insufficient documentation

## 2022-02-03 DIAGNOSIS — Z853 Personal history of malignant neoplasm of breast: Secondary | ICD-10-CM | POA: Insufficient documentation

## 2022-02-03 DIAGNOSIS — R079 Chest pain, unspecified: Secondary | ICD-10-CM | POA: Diagnosis present

## 2022-02-03 HISTORY — DX: Acute myocardial infarction, unspecified: I21.9

## 2022-02-03 HISTORY — DX: Atherosclerotic heart disease of native coronary artery without angina pectoris: I25.10

## 2022-02-03 LAB — BASIC METABOLIC PANEL
Anion gap: 10 (ref 5–15)
BUN: 14 mg/dL (ref 6–20)
CO2: 26 mmol/L (ref 22–32)
Calcium: 9 mg/dL (ref 8.9–10.3)
Chloride: 102 mmol/L (ref 98–111)
Creatinine, Ser: 1.16 mg/dL — ABNORMAL HIGH (ref 0.44–1.00)
GFR, Estimated: 55 mL/min — ABNORMAL LOW (ref >60)
Glucose, Bld: 90 mg/dL (ref 70–99)
Potassium: 3.5 mmol/L (ref 3.5–5.1)
Sodium: 138 mmol/L (ref 135–145)

## 2022-02-03 LAB — CBC
HCT: 41.6 % (ref 36.0–46.0)
Hemoglobin: 13.3 g/dL (ref 12.0–15.0)
MCH: 27.9 pg (ref 26.0–34.0)
MCHC: 32 g/dL (ref 30.0–36.0)
MCV: 87.2 fL (ref 80.0–100.0)
Platelets: 197 10*3/uL (ref 150–400)
RBC: 4.77 MIL/uL (ref 3.87–5.11)
RDW: 13.7 % (ref 11.5–15.5)
WBC: 4.7 10*3/uL (ref 4.0–10.5)
nRBC: 0 % (ref 0.0–0.2)

## 2022-02-03 LAB — TROPONIN I (HIGH SENSITIVITY)
Troponin I (High Sensitivity): 4 ng/L (ref <18)
Troponin I (High Sensitivity): 3 ng/L (ref <18)

## 2022-02-03 LAB — D-DIMER, QUANTITATIVE: D-Dimer, Quant: 0.46 ug/mL-FEU (ref 0.00–0.50)

## 2022-02-03 LAB — BRAIN NATRIURETIC PEPTIDE: B Natriuretic Peptide: 106.6 pg/mL — ABNORMAL HIGH (ref 0.0–100.0)

## 2022-02-03 NOTE — ED Notes (Signed)
Patient transported to X-ray 

## 2022-02-03 NOTE — ED Provider Triage Note (Addendum)
Emergency Medicine Provider Triage Evaluation Note  Glennie Bose , a 57 y.o. female  was evaluated in triage.  Pt complains of chest pain into the chest that started yesterday and was intermittent yesterday, more constant today.  It does not radiate.  No nausea sweating dizziness, no abdominal pain..  She does have history of hypertension and is on medication for cholesterol and had angioplasty in 2021  Review of Systems  Positive: Chest pain, Negative: Shortness of breath  Physical Exam  BP (!) 143/79   Pulse 64   Temp 97.9 F (36.6 C)   Resp 20   Ht '5\' 6"'$  (1.676 m)   Wt 117.9 kg   LMP 09/04/2018 (Exact Date)   SpO2 97%   BMI 41.97 kg/m  Gen:   Awake, no distress   Resp:  Normal effort  MSK:   Moves extremities without difficulty  Other:    Medical Decision Making  Medically screening exam initiated at 1:50 PM.  Appropriate orders placed.  Raevin Wierenga was informed that the remainder of the evaluation will be completed by another provider, this initial triage assessment does not replace that evaluation, and the importance of remaining in the ED until their evaluation is complete.  Charge nurse was informed by triage RN patient needs a room immediately     Gwenevere Abbot, PA-C 02/03/22 [address], [place] 02/03/22 1411

## 2022-02-03 NOTE — ED Triage Notes (Addendum)
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 Pain is non-radiating, described as "someone standing on my chest," and is palliated by rest. Pt had associated ShOB. Denies nausea, diaphoresis. Pt with hx of AMI, CAD, HTN.

## 2022-02-03 NOTE — ED Provider Notes (Signed)
[facility] Provider Note   CSN: [id number] Arrival date & time: 02/03/22  1332     History {Add pertinent medical, surgical, social history, OB history to HPI:1} Chief Complaint  Patient presents with   Chest Pain    Roberta [name] is a 57 y.o. female.   Chest Pain     57 year old female presents emergency department with complaints of chest pain.  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  She reports symptoms lasting for approximately 1 to 2 hours before resolution spontaneously.  She reports another episode earlier today where she had centralized chest pain described as pressure lasting for approximately 1 to 2 hours before complete resolution was had without medical therapy.  Reports some associated shortness of breath.  States she had similar symptoms prior when she had NSTEMI with PCI with no stent placement in 2021.  Denies fever, cough, congestion, abdominal pain, nausea, vomiting.  Past medical history significant for breast cancer post left mastectomy on anastrozole, hypertension, obesity, NSTEMI, anxiety, depression, Roux-en-Y 2005, CAD.  Home Medications Prior to Admission medications   Medication Sig Start Date End Date Taking? Authorizing Provider  anastrozole (ARIMIDEX) 1 MG tablet Take 1 tablet (1 mg total) by mouth daily. 10/14/21   Benay Pike, MD  aspirin EC 81 MG EC tablet Take 1 tablet (81 mg total) by mouth daily. Swallow whole. 12/13/19   Kroeger, Lorelee Cover., PA-C  atorvastatin (LIPITOR) 80 MG tablet Take 1 tablet (80 mg total) by mouth daily. 07/01/21   Lelon Perla, MD  benzonatate (TESSALON) 100 MG capsule Take 1 capsule (100 mg total) by mouth every 8 (eight) hours as needed for cough. 11/29/21   Teodora Medici, FNP  CINNAMON PO Take 2 capsules by mouth daily.    [provider]  Ferrous Sulfate (IRON PO) Take 65 mg by mouth daily.     [provider]  fluticasone (FLONASE) 50 MCG/ACT nasal spray Place 1 spray into both nostrils daily. 11/29/21   Teodora Medici, FNP  hydrochlorothiazide (MICROZIDE) 12.5 MG capsule TAKE 1 CAPSULE BY MOUTH EVERY DAY 12/12/21   Lendon Colonel, NP  metoprolol tartrate (LOPRESSOR) 25 MG tablet TAKE 1/2 TABLET TWICE A DAY BY MOUTH 01/09/22   Lelon Perla, MD  Multiple Vitamin (MULTIVITAMIN) tablet Take 1 tablet by mouth daily.    [provider]  nitroGLYCERIN (NITROSTAT) 0.4 MG SL tablet Place 1 tablet (0.4 mg total) under the tongue every 5 (five) minutes x 3 doses as needed for chest pain. 12/24/20   Lendon Colonel, NP      Allergies    Lisinopril, Amlodipine, Penicillins, Tetanus toxoid, Imdur [isosorbide nitrate], Norvasc [amlodipine besylate], Red dye, and Tetanus toxoids    Review of Systems   Review of Systems  Cardiovascular:  Positive for chest pain.  All other systems reviewed and are negative.   Physical Exam Updated Vital Signs BP (!) 143/79   Pulse 64   Temp 97.9 F (36.6 C)   Resp 20   Ht '5\' 6"'$  (1.676 m)   Wt 117.9 kg   LMP 09/04/2018 (Exact Date)   SpO2 97%   BMI 41.97 kg/m  Physical Exam Vitals and nursing note reviewed.  Constitutional:      General: She is not in acute distress.    Appearance: She is well-developed.  HENT:     Head: Normocephalic and atraumatic.  Eyes:  Conjunctiva/sclera: Conjunctivae normal.  Cardiovascular:     Rate and Rhythm: Normal rate and regular rhythm.     Heart sounds: No murmur heard. Pulmonary:     Effort: Pulmonary effort is normal. No respiratory distress.     Breath sounds: Normal breath sounds. No wheezing, rhonchi or rales.  Abdominal:     Palpations: Abdomen is soft.     Tenderness: There is no abdominal tenderness.  Musculoskeletal:        General: No swelling.     Cervical back: Neck supple.     Right lower leg: No edema.     Left lower leg: Edema present.  Skin:    General:  Skin is warm and dry.     Capillary Refill: Capillary refill takes less than 2 seconds.  Neurological:     Mental Status: She is alert.  Psychiatric:        Mood and Affect: Mood normal.     ED Results / Procedures / Treatments   Labs (all labs ordered are listed, but only abnormal results are displayed) Labs Reviewed  BASIC METABOLIC PANEL - Abnormal; Notable for the following components:      Result Value   Creatinine, Ser 1.16 (*)    GFR, Estimated 55 (*)    All other components within normal limits  CBC  BRAIN NATRIURETIC PEPTIDE  D-DIMER, QUANTITATIVE (NOT AT [facility])  TROPONIN I (HIGH SENSITIVITY)  TROPONIN I (HIGH SENSITIVITY)    EKG EKG Interpretation  Date/Time:  Friday February 03 2022 13:33:18 EST Ventricular Rate:  61 PR Interval:  182 QRS Duration: 94 QT Interval:  406 QTC Calculation: 408 R Axis:   47 Text Interpretation: Normal sinus rhythm Normal ECG When compared with ECG of 12-Dec-2019 06:25, PREVIOUS ECG IS PRESENT Confirmed by Cindee Lame [phone]) on 02/03/2022 2:55:47 PM  Radiology DG Chest 2 View  Result Date: 02/03/2022 CLINICAL DATA:  Chest pain. EXAM: CHEST - 2 VIEW COMPARISON:  Chest x-ray 12/11/2019. FINDINGS: The heart size and mediastinal contours are within normal limits. Both lungs are clear. No visible pleural effusions or pneumothorax. No acute osseous abnormality. IMPRESSION: No active cardiopulmonary disease. Electronically Signed   By: Margaretha Sheffield M.D.   On: 02/03/2022 15:06    Procedures Procedures  {Document cardiac monitor, telemetry assessment procedure when appropriate:1}  Medications Ordered in ED Medications - No data to display  ED Course/ Medical Decision Making/ A&P Clinical Course as of 02/03/22 1728  Fri Feb 03, 2022  1512 DG Chest 2 View FINDINGS: The heart size and mediastinal contours are within normal limits. Both lungs are clear. No visible pleural effusions or pneumothorax. No acute osseous  abnormality.  IMPRESSION: No active cardiopulmonary disease.   [HN]  1914 Basic metabolic panel(!) Unremaarkble, mild elevation in Cr [HN]  1512 Troponin I (High Sensitivity): 4 [HN]  1512 CBC wnl [HN]    Clinical Course User Index [HN] Audley Hose, MD   {   Click here for ABCD2, HEART and other calculatorsREFRESH Note before signing :1}                          Medical Decision Making Amount and/or Complexity of Data Reviewed Labs: ordered. Decision-making details documented in ED Course. Radiology: ordered. Decision-making details documented in ED Course.   This patient presents to the ED for concern of chest pain, this involves an extensive number of treatment options, and is a complaint that carries with it a  high risk of complications and morbidity.  The differential diagnosis includes ACS, PE, pneumonia, pneumothorax, tamponade, aortic dissection, pericarditis/myocarditis, pleural effusion, MSK, GERD, pleurisy   Co morbidities that complicate the patient evaluation  See HPI   Additional history obtained:  Additional history obtained from EMR External records from outside source obtained and reviewed including hospital records   Lab Tests:  I Ordered, and personally interpreted labs.  The pertinent results include: No leukocytosis noted.  No evidence of anemia.  Platelets within normal range.  No electrolyte abnormalities appreciated.  Renal function with GFR 55, creatinine 1.16 and BUN of 14 oh which is near patient's baseline renal function.  BNP slightly elevated of 106.6.  Initial troponin of 4 with repeat 3; EKG concerning for sinus rhythm without acute ischemic changes; doubt ACS.  D-dimer***   Imaging Studies ordered:  I ordered imaging studies including chest x-ray*** I independently visualized and interpreted imaging which showed no acute cardiopulmonary abnormalities I agree with the radiologist interpretation   Cardiac Monitoring: / EKG:  The  patient was maintained on a cardiac monitor.  I personally viewed and interpreted the cardiac monitored which showed an underlying rhythm of: Sinus rhythm without acute ischemic change   Consultations Obtained:  I requested consultation with attending physician Dr. Matilde Sprang who was in agreement with treatment plan going forward.   Problem List / ED Course / Critical interventions / Medication management  Chest pain Reevaluation of the patient showed that the patient stayed the same I have reviewed the patients home medicines and have made adjustments as needed   Social Determinants of Health:  Denies tobacco, illicit drug use   Test / Admission - Considered:  Chest pain Vitals signs significant for ***. Otherwise within normal range and stable throughout visit. Laboratory/imaging studies significant for: *** *** Worrisome signs and symptoms were discussed with the patient, and the patient acknowledged understanding to return to the ED if noticed. Patient was stable upon discharge.   {Document critical care time when appropriate:1} {Document review of labs and clinical decision tools ie heart score, Chads2Vasc2 etc:1}  {Document your independent review of radiology images, and any outside records:1} {Document your discussion with family members, caretakers, and with consultants:1} {Document social determinants of health affecting pt's care:1} {Document your decision making why or why not admission, treatments were needed:1} Final Clinical Impression(s) / ED Diagnoses Final diagnoses:  None    Rx / DC Orders ED Discharge Orders     None

## 2022-02-04 NOTE — ED Provider Notes (Signed)
  Physical Exam  BP (!) 153/85 (BP Location: Right Arm)   Pulse 64   Temp 97.9 F (36.6 C) (Oral)   Resp 16   Ht '5\' 6"'$  (1.676 m)   Wt 117.9 kg   LMP 09/04/2018 (Exact Date)   SpO2 99%   BMI 41.95 kg/m   Physical Exam Vitals and nursing note reviewed.  Constitutional:      General: She is not in acute distress.    Appearance: She is well-developed.  HENT:     Head: Normocephalic and atraumatic.  Eyes:     Conjunctiva/sclera: Conjunctivae normal.  Cardiovascular:     Rate and Rhythm: Normal rate and regular rhythm.     Heart sounds: No murmur heard. Pulmonary:     Effort: Pulmonary effort is normal. No respiratory distress.  Musculoskeletal:        General: No swelling.     Cervical back: Neck supple.  Skin:    General: Skin is warm and dry.     Capillary Refill: Capillary refill takes less than 2 seconds.  Neurological:     Mental Status: She is alert.  Psychiatric:        Mood and Affect: Mood normal.     Procedures  Procedures  ED Course / MDM   Clinical Course as of 02/04/22 1157  Fri Feb 03, 2022  1512 DG Chest 2 View FINDINGS: The heart size and mediastinal contours are within normal limits. Both lungs are clear. No visible pleural effusions or pneumothorax. No acute osseous abnormality.  IMPRESSION: No active cardiopulmonary disease.   [HN]  2330 Basic metabolic panel(!) Unremaarkble, mild elevation in Cr [HN]  1512 Troponin I (High Sensitivity): 4 [HN]  1512 CBC wnl [HN]    Clinical Course User Index [HN] Audley Hose, MD   Medical Decision Making Amount and/or Complexity of Data Reviewed Labs: ordered. Decision-making details documented in ED Course. Radiology: ordered. Decision-making details documented in ED Course.   Patient received an handoff.  Chest pain pending D-dimer.  Patient had multiple D-dimer samples taken which clotted twice.  While waiting for the third D-dimer, the patient told nursing staff that she is currently  intermittent fasting and must leave the emergency department immediately to "have a proper meal".  It is stressed importance of waiting for this test but the patient decided to leave the emergency department [place].  (Of note, I did follow-up on this test and it was negative).       Teressa Lower, MD 02/04/22 [phone]

## 2022-03-17 ENCOUNTER — Encounter: Payer: Self-pay | Admitting: Cardiology

## 2022-03-17 DIAGNOSIS — D509 Iron deficiency anemia, unspecified: Secondary | ICD-10-CM

## 2022-03-17 DIAGNOSIS — E559 Vitamin D deficiency, unspecified: Secondary | ICD-10-CM

## 2022-03-17 DIAGNOSIS — I10 Essential (primary) hypertension: Secondary | ICD-10-CM

## 2022-03-27 MED ORDER — HYDROCHLOROTHIAZIDE 12.5 MG PO CAPS: 12.5000 mg | ORAL_CAPSULE | Freq: Every day | ORAL | 1 refills | Status: DC

## 2022-03-27 NOTE — Addendum Note (Signed)
Addended by: Fidel Levy on: 03/27/2022 10:58 AM   Modules accepted: Orders

## 2022-04-19 ENCOUNTER — Encounter: Payer: Self-pay | Admitting: Internal Medicine

## 2022-04-20 ENCOUNTER — Encounter: Payer: Self-pay | Admitting: Internal Medicine

## 2022-04-20 NOTE — Patient Instructions (Addendum)
        Medications changes include :   lasix 20 mg daily.  Stop the hydrochlorothiazide.      Return in about 2 months (around 06/21/2022) for Physical Exam.

## 2022-04-20 NOTE — Progress Notes (Signed)
Subjective:    Patient ID: Roberta [name], female    DOB: [birth year], 57 y.o.   MRN: [id number]      HPI Roberta [name] is here for  Chief Complaint  Patient presents with   Edema    Returned from vacation abroad.  She wore her compression socks a lot.  Still has swelling.  The swelling improves overnight but not going away completely.   Leg swelling-currently taking hydrochlorothiazide 12.5 mg daily for hypertension.  Has a history of the left > right leg edema     Echo: 2021-EF 65-70%, grade 1 DD  Medications and allergies reviewed with patient and updated if appropriate.  Current Outpatient Medications on File Prior to Visit  Medication Sig Dispense Refill   anastrozole (ARIMIDEX) 1 MG tablet Take 1 tablet (1 mg total) by mouth daily. 90 tablet 4   aspirin EC 81 MG EC tablet Take 1 tablet (81 mg total) by mouth daily. Swallow whole. 30 tablet 11   atorvastatin (LIPITOR) 80 MG tablet Take 1 tablet (80 mg total) by mouth daily. 90 tablet 3   CINNAMON PO Take 2 capsules by mouth daily.     Ferrous Sulfate (IRON PO) Take 65 mg by mouth daily.     fluticasone (FLONASE) 50 MCG/ACT nasal spray Place 1 spray into both nostrils daily. 16 g 0   hydrochlorothiazide (MICROZIDE) 12.5 MG capsule Take 1 capsule (12.5 mg total) by mouth daily. 90 capsule 1   metoprolol tartrate (LOPRESSOR) 25 MG tablet TAKE 1/2 TABLET TWICE A DAY BY MOUTH 90 tablet 2   Multiple Vitamin (MULTIVITAMIN) tablet Take 1 tablet by mouth daily.     nitroGLYCERIN (NITROSTAT) 0.4 MG SL tablet Place 1 tablet (0.4 mg total) under the tongue every 5 (five) minutes x 3 doses as needed for chest pain. 25 tablet 5   No current facility-administered medications on file prior to visit.    Review of Systems  Constitutional:  Negative for fever.  Respiratory:  Negative for cough, shortness of breath and wheezing.   Cardiovascular:  Positive for leg swelling. Negative for chest pain and palpitations.  Neurological:   Negative for dizziness, light-headedness and headaches.       Objective:   Vitals:   04/21/22 0830  BP: 134/78  Pulse: 67  Temp: 98.3 F (36.8 C)  SpO2: 98%   BP Readings from Last 3 Encounters:  04/21/22 134/78  02/03/22 (!) 153/85  11/29/21 136/83   Wt Readings from Last 3 Encounters:  04/21/22 270 lb (122.5 kg)  02/03/22 259 lb 14.8 oz (117.9 kg)  08/01/21 270 lb (122.5 kg)   Body mass index is 43.58 kg/m.    Physical Exam Constitutional:      General: She is not in acute distress.    Appearance: Normal appearance.  HENT:     Head: Normocephalic and atraumatic.  Eyes:     Conjunctiva/sclera: Conjunctivae normal.  Cardiovascular:     Rate and Rhythm: Normal rate and regular rhythm.     Heart sounds: Normal heart sounds.  Pulmonary:     Effort: Pulmonary effort is normal. No respiratory distress.     Breath sounds: Normal breath sounds. No wheezing.  Musculoskeletal:     Right lower leg: Edema (trace) present.     Left lower leg: Edema (mild, pitting) present.  Skin:    General: Skin is warm and dry.     Findings: No rash.  Neurological:     Mental Status: She is  alert. Mental status is at baseline.  Psychiatric:        Mood and Affect: Mood normal.        Behavior: Behavior normal.            Assessment & Plan:    See Problem List for Assessment and Plan of chronic medical problems.

## 2022-04-21 ENCOUNTER — Ambulatory Visit: Payer: BC Managed Care – PPO | Admitting: Internal Medicine

## 2022-04-21 VITALS — BP 134/78 | HR 67 | Temp 98.3°F | Ht 66.0 in | Wt 270.0 lb

## 2022-04-21 DIAGNOSIS — R7303 Prediabetes: Secondary | ICD-10-CM

## 2022-04-21 DIAGNOSIS — I2510 Atherosclerotic heart disease of native coronary artery without angina pectoris: Secondary | ICD-10-CM

## 2022-04-21 DIAGNOSIS — Z6841 Body Mass Index (BMI) 40.0 and over, adult: Secondary | ICD-10-CM

## 2022-04-21 DIAGNOSIS — R600 Localized edema: Secondary | ICD-10-CM

## 2022-04-21 DIAGNOSIS — I10 Essential (primary) hypertension: Secondary | ICD-10-CM

## 2022-04-21 MED ORDER — FUROSEMIDE 20 MG PO TABS: 20.0000 mg | ORAL_TABLET | Freq: Every day | ORAL | 5 refills | Status: DC

## 2022-04-21 NOTE — Assessment & Plan Note (Signed)
Acute on Chronic Started with recent travel - has improved but is not resolving Likely venous insufficiency Stop hctz Start lasix 20 mg daily Stressed low sodium diet, exercise and weight loss Advised compression socks as much as possible F/u in 2 months

## 2022-04-21 NOTE — Assessment & Plan Note (Signed)
Chronic Blood pressure adequately controlled Stressed low sodium diet, exercise, weight loss Will d/c hctz 12.5 mg  and start lasix 20 mg daily for edema Continue  metoprolol 12.5 mg twice daily

## 2022-04-21 NOTE — Assessment & Plan Note (Signed)
Chronic Stressed weight loss - discussed effects on leg edema, kidney function  Discussed how to fit in exercise a few times a week Hx of gastric bypass-  can not eat too much at one time - snacks throughout day Discussed decreasing snacks - calorie intake

## 2022-06-06 ENCOUNTER — Other Ambulatory Visit: Payer: Self-pay | Admitting: Obstetrics and Gynecology

## 2022-06-06 DIAGNOSIS — Z1231 Encounter for screening mammogram for malignant neoplasm of breast: Secondary | ICD-10-CM

## 2022-06-19 LAB — HM DIABETES EYE EXAM

## 2022-06-20 ENCOUNTER — Encounter: Payer: Self-pay | Admitting: Internal Medicine

## 2022-06-20 NOTE — Patient Instructions (Addendum)
    Blood work was ordered.   The lab is on the first floor.    Medications changes include :   None    Return in about 6 months (around 12/21/2022) for follow up.    Health Maintenance, Female Adopting a healthy lifestyle and getting preventive care are important in promoting health and wellness. Ask your health care provider about: The right schedule for you to have regular tests and exams. Things you can do on your own to prevent diseases and keep yourself healthy. What should I know about diet, weight, and exercise? Eat a healthy diet  Eat a diet that includes plenty of vegetables, fruits, low-fat dairy products, and lean protein. Do not eat a lot of foods that are high in solid fats, added sugars, or sodium. Maintain a healthy weight Body mass index (BMI) is used to identify weight problems. It estimates body fat based on height and weight. Your health care provider can help determine your BMI and help you achieve or maintain a healthy weight. Get regular exercise Get regular exercise. This is one of the most important things you can do for your health. Most adults should: Exercise for at least 150 minutes each week. The exercise should increase your heart rate and make you sweat (moderate-intensity exercise). Do strengthening exercises at least twice a week. This is in addition to the moderate-intensity exercise. Spend less time sitting. Even light physical activity can be beneficial. Watch cholesterol and blood lipids Have your blood tested for lipids and cholesterol at 59 years of age, then have this test every 5 years. Have your cholesterol levels checked more often if: Your lipid or cholesterol levels are high. You are older than 59 years of age. You are at high risk for heart disease. What should I know about cancer screening? Depending on your health history and family history, you may need to have cancer screening at various ages. This may include screening  for: Breast cancer. Cervical cancer. Colorectal cancer. Skin cancer. Lung cancer. What should I know about heart disease, diabetes, and high blood pressure? Blood pressure and heart disease High blood pressure causes heart disease and increases the risk of stroke. This is more likely to develop in people who have high blood pressure readings or are overweight. Have your blood pressure checked: Every 3-5 years if you are 18-39 years of age. Every year if you are 40 years old or older. Diabetes Have regular diabetes screenings. This checks your fasting blood sugar level. Have the screening done: Once every three years after age 40 if you are at a normal weight and have a low risk for diabetes. More often and at a younger age if you are overweight or have a high risk for diabetes. What should I know about preventing infection? Hepatitis B If you have a higher risk for hepatitis B, you should be screened for this virus. Talk with your health care provider to find out if you are at risk for hepatitis B infection. Hepatitis C Testing is recommended for: Everyone born from 1945 through 1965. Anyone with known risk factors for hepatitis C. Sexually transmitted infections (STIs) Get screened for STIs, including gonorrhea and chlamydia, if: You are sexually active and are younger than 59 years of age. You are older than 59 years of age and your health care provider tells you that you are at risk for this type of infection. Your sexual activity has changed since you were last screened, and you are at   increased risk for chlamydia or gonorrhea. Ask your health care provider if you are at risk. Ask your health care provider about whether you are at high risk for HIV. Your health care provider may recommend a prescription medicine to help prevent HIV infection. If you choose to take medicine to prevent HIV, you should first get tested for HIV. You should then be tested every 3 months for as long as you  are taking the medicine. Pregnancy If you are about to stop having your period (premenopausal) and you may become pregnant, seek counseling before you get pregnant. Take 400 to 800 micrograms (mcg) of folic acid every day if you become pregnant. Ask for birth control (contraception) if you want to prevent pregnancy. Osteoporosis and menopause Osteoporosis is a disease in which the bones lose minerals and strength with aging. This can result in bone fractures. If you are 65 years old or older, or if you are at risk for osteoporosis and fractures, ask your health care provider if you should: Be screened for bone loss. Take a calcium or vitamin D supplement to lower your risk of fractures. Be given hormone replacement therapy (HRT) to treat symptoms of menopause. Follow these instructions at home: Alcohol use Do not drink alcohol if: Your health care provider tells you not to drink. You are pregnant, may be pregnant, or are planning to become pregnant. If you drink alcohol: Limit how much you have to: 0-1 drink a day. Know how much alcohol is in your drink. In the [place] one drink equals one 12 oz bottle of beer (355 mL), one 5 oz glass of wine (148 mL), or one 1 oz glass of hard liquor (44 mL). Lifestyle Do not use any products that contain nicotine or tobacco. These products include cigarettes, chewing tobacco, and vaping devices, such as e-cigarettes. If you need help quitting, ask your health care provider. Do not use street drugs. Do not share needles. Ask your health care provider for help if you need support or information about quitting drugs. General instructions Schedule regular health, dental, and eye exams. Stay current with your vaccines. Tell your health care provider if: You often feel depressed. You have ever been abused or do not feel safe at home. Summary Adopting a healthy lifestyle and getting preventive care are important in promoting health and wellness. Follow your  health care provider's instructions about healthy diet, exercising, and getting tested or screened for diseases. Follow your health care provider's instructions on monitoring your cholesterol and blood pressure. This information is not intended to replace advice given to you by your health care provider. Make sure you discuss any questions you have with your health care provider. Document Revised: 05/10/2020 Document Reviewed: 05/10/2020 Elsevier Patient Education  2024 Elsevier Inc.  

## 2022-06-20 NOTE — Progress Notes (Unsigned)
Subjective:    Patient ID: Roberta [name], female    DOB: July 10, 1965, 57 y.o.   MRN: [id number]      HPI Roberta [name] is here for a Physical exam and her chronic medical problems.    Exercising regularly.       Medications and allergies reviewed with patient and updated if appropriate.  Current Outpatient Medications on File Prior to Visit  Medication Sig Dispense Refill   anastrozole (ARIMIDEX) 1 MG tablet Take 1 tablet (1 mg total) by mouth daily. 90 tablet 4   aspirin EC 81 MG EC tablet Take 1 tablet (81 mg total) by mouth daily. Swallow whole. 30 tablet 11   atorvastatin (LIPITOR) 80 MG tablet Take 1 tablet (80 mg total) by mouth daily. 90 tablet 3   CINNAMON PO Take 2 capsules by mouth daily.     Ferrous Sulfate (IRON PO) Take 65 mg by mouth daily.     furosemide (LASIX) 20 MG tablet Take 1 tablet (20 mg total) by mouth daily. 30 tablet 5   metoprolol tartrate (LOPRESSOR) 25 MG tablet TAKE 1/2 TABLET TWICE A DAY BY MOUTH 90 tablet 2   Multiple Vitamin (MULTIVITAMIN) tablet Take 1 tablet by mouth daily.     nitroGLYCERIN (NITROSTAT) 0.4 MG SL tablet Place 1 tablet (0.4 mg total) under the tongue every 5 (five) minutes x 3 doses as needed for chest pain. 25 tablet 5   No current facility-administered medications on file prior to visit.    Review of Systems  Constitutional:  Negative for fever.  Eyes:  Negative for visual disturbance.  Respiratory:  Negative for cough, shortness of breath and wheezing.   Cardiovascular:  Positive for leg swelling (controlled). Negative for chest pain and palpitations.  Gastrointestinal:  Positive for constipation. Negative for abdominal pain, blood in stool and diarrhea.       No gerd  Genitourinary:  Negative for dysuria.  Musculoskeletal:  Negative for arthralgias and back pain.  Skin:  Negative for rash.  Neurological:  Negative for light-headedness and headaches.  Psychiatric/Behavioral:  Negative for dysphoric mood. The patient is  not nervous/anxious.        Objective:   Vitals:   06/21/22 1312  BP: 138/84  Pulse: 67  Temp: 98.2 F (36.8 C)  SpO2: 94%   Filed Weights   06/21/22 1312  Weight: 263 lb 4 oz (119.4 kg)   Body mass index is 42.49 kg/m.  BP Readings from Last 3 Encounters:  06/21/22 138/84  04/21/22 134/78  02/03/22 (!) 153/85    Wt Readings from Last 3 Encounters:  06/21/22 263 lb 4 oz (119.4 kg)  04/21/22 270 lb (122.5 kg)  02/03/22 259 lb 14.8 oz (117.9 kg)       Physical Exam Constitutional: She appears well-developed and well-nourished. No distress.  HENT:  Head: Normocephalic and atraumatic.  Right Ear: External ear normal. Normal ear canal and TM Left Ear: External ear normal.  Normal ear canal and TM Mouth/Throat: Oropharynx is clear and moist.  Eyes: Conjunctivae normal.  Neck: Neck supple. No tracheal deviation present. No thyromegaly present.  No carotid bruit  Cardiovascular: Normal rate, regular rhythm and normal heart sounds.   No murmur heard.  No edema. Pulmonary/Chest: Effort normal and breath sounds normal. No respiratory distress. She has no wheezes. She has no rales.  Breast: deferred   Abdominal: Soft. She exhibits no distension. There is no tenderness.  Lymphadenopathy: She has no cervical adenopathy.  Skin: Skin  is warm and dry. She is not diaphoretic.  Psychiatric: She has a normal mood and affect. Her behavior is normal.     Lab Results  Component Value Date   WBC 4.7 02/03/2022   HGB 13.3 02/03/2022   HCT 41.6 02/03/2022   PLT 197 02/03/2022   GLUCOSE 90 02/03/2022   CHOL 120 07/01/2021   TRIG 45 07/01/2021   HDL 70 07/01/2021   LDLCALC 39 07/01/2021   ALT 19 07/29/2021   AST 22 07/29/2021   NA 138 02/03/2022   K 3.5 02/03/2022   CL 102 02/03/2022   CREATININE 1.16 (H) 02/03/2022   BUN 14 02/03/2022   CO2 26 02/03/2022   TSH 1.820 12/29/2020   HGBA1C 5.8 (H) 07/01/2021         Assessment & Plan:   Physical exam: Screening  blood work  ordered Exercise  regular - gym Weight  working on weight loss Substance abuse  none   Reviewed recommended immunizations.   Health Maintenance  Topic Date Due   DTaP/Tdap/Td (1 - Tdap) Never done   Zoster Vaccines- Shingrix (1 of 2) Never done   PAP SMEAR-Modifier  03/28/2018   COVID-19 Vaccine (5 - 2023-24 season) 09/02/2021   INFLUENZA VACCINE  08/03/2022   MAMMOGRAM  07/30/2023   Colonoscopy  08/15/2028   Hepatitis C Screening  Completed   HIV Screening  Completed   HPV VACCINES  Aged Out          See Problem List for Assessment and Plan of chronic medical problems.

## 2022-06-21 ENCOUNTER — Ambulatory Visit (INDEPENDENT_AMBULATORY_CARE_PROVIDER_SITE_OTHER): Payer: BC Managed Care – PPO | Admitting: Internal Medicine

## 2022-06-21 ENCOUNTER — Encounter: Payer: Self-pay | Admitting: Internal Medicine

## 2022-06-21 VITALS — BP 138/84 | HR 67 | Temp 98.2°F | Ht 66.0 in | Wt 263.2 lb

## 2022-06-21 DIAGNOSIS — R7303 Prediabetes: Secondary | ICD-10-CM | POA: Diagnosis not present

## 2022-06-21 DIAGNOSIS — Z0000 Encounter for general adult medical examination without abnormal findings: Secondary | ICD-10-CM

## 2022-06-21 DIAGNOSIS — Z9861 Coronary angioplasty status: Secondary | ICD-10-CM | POA: Diagnosis not present

## 2022-06-21 DIAGNOSIS — H3500 Unspecified background retinopathy: Secondary | ICD-10-CM | POA: Insufficient documentation

## 2022-06-21 DIAGNOSIS — E559 Vitamin D deficiency, unspecified: Secondary | ICD-10-CM

## 2022-06-21 DIAGNOSIS — I10 Essential (primary) hypertension: Secondary | ICD-10-CM

## 2022-06-21 DIAGNOSIS — R600 Localized edema: Secondary | ICD-10-CM

## 2022-06-21 DIAGNOSIS — I2510 Atherosclerotic heart disease of native coronary artery without angina pectoris: Secondary | ICD-10-CM

## 2022-06-21 DIAGNOSIS — Z6841 Body Mass Index (BMI) 40.0 and over, adult: Secondary | ICD-10-CM

## 2022-06-21 LAB — COMPREHENSIVE METABOLIC PANEL
ALT: 29 U/L (ref 0–35)
AST: 31 U/L (ref 0–37)
Albumin: 3.8 g/dL (ref 3.5–5.2)
Alkaline Phosphatase: 114 U/L (ref 39–117)
BUN: 17 mg/dL (ref 6–23)
CO2: 29 mEq/L (ref 19–32)
Calcium: 9.3 mg/dL (ref 8.4–10.5)
Chloride: 101 mEq/L (ref 96–112)
Creatinine, Ser: 0.95 mg/dL (ref 0.40–1.20)
GFR: 65.7 mL/min (ref >60.00)
Glucose, Bld: 86 mg/dL (ref 70–99)
Potassium: 3.6 mEq/L (ref 3.5–5.1)
Sodium: 138 mEq/L (ref 135–145)
Total Bilirubin: 0.7 mg/dL (ref 0.2–1.2)
Total Protein: 7.9 g/dL (ref 6.0–8.3)

## 2022-06-21 LAB — VITAMIN D 25 HYDROXY (VIT D DEFICIENCY, FRACTURES): VITD: 37.33 ng/mL (ref 30.00–100.00)

## 2022-06-21 LAB — CBC WITH DIFFERENTIAL/PLATELET
Basophils Absolute: 0 10*3/uL (ref 0.0–0.1)
Basophils Relative: 0.8 % (ref 0.0–3.0)
Eosinophils Absolute: 0.1 10*3/uL (ref 0.0–0.7)
Eosinophils Relative: 2.9 % (ref 0.0–5.0)
HCT: 42.7 % (ref 36.0–46.0)
Hemoglobin: 14 g/dL (ref 12.0–15.0)
Lymphocytes Relative: 43.1 % (ref 12.0–46.0)
Lymphs Abs: 1.8 10*3/uL (ref 0.7–4.0)
MCHC: 32.8 g/dL (ref 30.0–36.0)
MCV: 84.6 fl (ref 78.0–100.0)
Monocytes Absolute: 0.4 10*3/uL (ref 0.1–1.0)
Monocytes Relative: 9.7 % (ref 3.0–12.0)
Neutro Abs: 1.8 10*3/uL (ref 1.4–7.7)
Neutrophils Relative %: 43.5 % (ref 43.0–77.0)
Platelets: 196 10*3/uL (ref 150.0–400.0)
RBC: 5.05 Mil/uL (ref 3.87–5.11)
RDW: 13.9 % (ref 11.5–15.5)
WBC: 4.2 10*3/uL (ref 4.0–10.5)

## 2022-06-21 LAB — LIPID PANEL
Cholesterol: 112 mg/dL (ref 0–200)
HDL: 59 mg/dL (ref >39.00)
LDL Cholesterol: 43 mg/dL (ref 0–99)
NonHDL: 53.09
Total CHOL/HDL Ratio: 2
Triglycerides: 50 mg/dL (ref 0.0–149.0)
VLDL: 10 mg/dL (ref 0.0–40.0)

## 2022-06-21 LAB — HEMOGLOBIN A1C: Hgb A1c MFr Bld: 5.7 % (ref 4.6–6.5)

## 2022-06-21 LAB — TSH: TSH: 1.51 u[IU]/mL (ref 0.35–5.50)

## 2022-06-21 NOTE — Assessment & Plan Note (Signed)
Chronic Taking vitamin D daily Check vitamin D level  

## 2022-06-21 NOTE — Assessment & Plan Note (Signed)
Chronic Lab Results  Component Value Date   HGBA1C 5.8 (H) 07/01/2021   Check A1c Continue diabetic diet, regular exercise Continue weight loss efforts

## 2022-06-21 NOTE — Assessment & Plan Note (Addendum)
Chronic Blood pressure borderline controlled Stressed low sodium diet, exercise, weight loss, which she is currently doing I think with further weight loss her blood pressure will improve-if it does not we will need to add a low-dose medication Continue  metoprolol 12.5 mg twice daily, lasix 20 mg daily for edema (was on HCTZ)

## 2022-06-21 NOTE — Assessment & Plan Note (Signed)
Chronic S/p PTCA to RI- POBA On metoprolol 12.5 mg twice daily, atorvastatin 80 mg daily, ASA Following with cardiology Denies any symptoms consistent with angina Exercising, working on weight loss BP well controlled, sugars controlled

## 2022-06-21 NOTE — Assessment & Plan Note (Signed)
Chronic Controlled Likely venous insufficiency Continue Lasix 20 mg daily Stressed low sodium diet, exercise and weight loss Advised compression socks as much as possible

## 2022-06-21 NOTE — Assessment & Plan Note (Addendum)
Chronic Actively working on weight loss and has lost some weight Stressed regular exercise Stressed decrease portions, high in protein, fiber and vegetables Concentrate eating healthy but keeping calorie intake low

## 2022-07-14 ENCOUNTER — Other Ambulatory Visit: Payer: Self-pay | Admitting: Cardiology

## 2022-07-19 ENCOUNTER — Other Ambulatory Visit: Payer: Self-pay | Admitting: Cardiology

## 2022-07-25 ENCOUNTER — Telehealth: Payer: Self-pay | Admitting: Hematology and Oncology

## 2022-07-25 NOTE — Telephone Encounter (Signed)
Patient is aware of rescheduled appointment times/dates due to provider being on [facility]

## 2022-07-31 ENCOUNTER — Ambulatory Visit: Payer: BC Managed Care – PPO | Admitting: Hematology and Oncology

## 2022-07-31 ENCOUNTER — Ambulatory Visit
Admission: RE | Admit: 2022-07-31 | Discharge: 2022-07-31 | Disposition: A | Discharge status: Home / Self Care | Payer: BC Managed Care – PPO | Source: Ambulatory Visit | Attending: Obstetrics and Gynecology | Admitting: Obstetrics and Gynecology

## 2022-07-31 DIAGNOSIS — Z1231 Encounter for screening mammogram for malignant neoplasm of breast: Secondary | ICD-10-CM

## 2022-08-03 ENCOUNTER — Other Ambulatory Visit: Payer: Self-pay | Admitting: Obstetrics and Gynecology

## 2022-08-03 DIAGNOSIS — R928 Other abnormal and inconclusive findings on diagnostic imaging of breast: Secondary | ICD-10-CM

## 2022-08-08 ENCOUNTER — Ambulatory Visit
Admission: RE | Admit: 2022-08-08 | Discharge: 2022-08-08 | Disposition: A | Discharge status: Home / Self Care | Payer: BC Managed Care – PPO | Source: Ambulatory Visit | Attending: Obstetrics and Gynecology | Admitting: Obstetrics and Gynecology

## 2022-08-08 ENCOUNTER — Other Ambulatory Visit: Payer: BC Managed Care – PPO

## 2022-08-08 DIAGNOSIS — R928 Other abnormal and inconclusive findings on diagnostic imaging of breast: Secondary | ICD-10-CM

## 2022-08-09 ENCOUNTER — Other Ambulatory Visit: Payer: Self-pay | Admitting: Cardiology

## 2022-08-15 ENCOUNTER — Encounter: Payer: Self-pay | Admitting: Cardiology

## 2022-08-16 NOTE — Progress Notes (Unsigned)
HPI: Follow-up coronary artery disease. Patient admitted December 2021 with non-ST elevation myocardial infarction. Cardiac catheterization revealed thrombotic occlusion of small ramus intermedius which was felt to be the culprit and 50% stenosis of small PDA. Ejection fraction was mildly reduced. Patient had PTCA of proximal ramus intermedius. Stent was not placed as the vessel was small. Echocardiogram at that time showed ejection fraction 65 to 70%, grade 1 diastolic dysfunction. Since last seen the patient denies any dyspnea on exertion, orthopnea, PND, pedal edema, palpitations, syncope or chest pain.   Current Outpatient Medications  Medication Sig Dispense Refill   anastrozole (ARIMIDEX) 1 MG tablet Take 1 tablet (1 mg total) by mouth daily. 90 tablet 4   aspirin EC 81 MG EC tablet Take 1 tablet (81 mg total) by mouth daily. Swallow whole. 30 tablet 11   atorvastatin (LIPITOR) 80 MG tablet TAKE 1 TABLET BY MOUTH EVERY DAY 15 tablet 0   CINNAMON PO Take 2 capsules by mouth daily.     Ferrous Sulfate (IRON PO) Take 65 mg by mouth daily.     furosemide (LASIX) 20 MG tablet Take 1 tablet (20 mg total) by mouth daily. 30 tablet 5   metoprolol tartrate (LOPRESSOR) 25 MG tablet TAKE 1/2 TABLET TWICE A DAY BY MOUTH 90 tablet 2   Multiple Vitamin (MULTIVITAMIN) tablet Take 1 tablet by mouth daily.     nitroGLYCERIN (NITROSTAT) 0.4 MG SL tablet Place 1 tablet (0.4 mg total) under the tongue every 5 (five) minutes x 3 doses as needed for chest pain. 25 tablet 5   No current facility-administered medications for this visit.     Past Medical History:  Diagnosis Date   AMI (acute myocardial infarction) (HCC)    Back pain    Cancer (HCC)    multifocal left breast   Coronary artery disease    Depression    Hip pain    Hypertension    Obesity    PONV (postoperative nausea and vomiting)    Prediabetes    Swelling    bilat LE   Wears glasses     Past Surgical History:  Procedure  Laterality Date   CHOLECYSTECTOMY     COLONOSCOPY     CORONARY BALLOON ANGIOPLASTY N/A 12/11/2019   Procedure: CORONARY BALLOON ANGIOPLASTY;  Surgeon: Yvonne Kendall, MD;  Location: [facility];  Service: Cardiovascular;  Laterality: N/A;   DILATION AND CURETTAGE OF UTERUS     GASTRIC BYPASS     KNEE SURGERY  2002   Ligament repair   LEFT HEART CATH AND CORONARY ANGIOGRAPHY N/A 12/11/2019   Procedure: LEFT HEART CATH AND CORONARY ANGIOGRAPHY;  Surgeon: Yvonne Kendall, MD;  Location: [facility];  Service: Cardiovascular;  Laterality: N/A;   MASTECTOMY Left 2019   MASTECTOMY W/ SENTINEL NODE BIOPSY Left 10/01/2018   Procedure: LEFT TOTAL MASTECTOMY WITH SENTINEL LYMPH NODE BIOPSY AND BLUE DYE INJECTION;  Surgeon: Claud Kelp, MD;  Location: [facility] OR;  Service: General;  Laterality: Left;    Social History   Socioeconomic History   Marital status: Divorced    Spouse name: Not on file   Number of children: Not on file   Years of education: Not on file   Highest education level: Bachelor's degree (e.g., BA, AB, BS)  Occupational History   Not on file  Tobacco Use   Smoking status: Never   Smokeless tobacco: Never  Vaping Use   Vaping status: Never Used  Substance and Sexual  Activity   Alcohol use: Yes    Comment: occasional wine   Drug use: No   Sexual activity: Not Currently  Other Topics Concern   Not on file  Social History Narrative   Not on file   Social Determinants of Health   Financial Resource Strain: Low Risk  (04/20/2022)   Overall Financial Resource Strain (CARDIA)    Difficulty of Paying Living Expenses: Not hard at all  Food Insecurity: No Food Insecurity (04/20/2022)   Hunger Vital Sign    Worried About Running Out of Food in the Last Year: Never true    Ran Out of Food in the Last Year: Never true  Transportation Needs: No Transportation Needs (04/20/2022)   PRAPARE - Administrator, Civil Service (Medical): No    Lack of  Transportation (Non-Medical): No  Physical Activity: Insufficiently Active (04/20/2022)   Exercise Vital Sign    Days of Exercise per Week: 3 days    Minutes of Exercise per Session: 30 min  Stress: No Stress Concern Present (04/20/2022)   [employer] of Occupational Health - Occupational Stress Questionnaire    Feeling of Stress : Not at all  Social Connections: Moderately Integrated (04/20/2022)   Social Connection and Isolation Panel [NHANES]    Frequency of Communication with Friends and Family: More than three times a week    Frequency of Social Gatherings with Friends and Family: Twice a week    Attends Religious Services: More than 4 times per year    Active Member of [employer] or Organizations: Yes    Attends Engineer, structural: More than 4 times per year    Marital Status: Divorced  Catering manager Violence: Not on file    Family History  Problem Relation Age of Onset   Diabetes Mother    Hypertension Mother    Heart disease Mother    Stroke Mother    Kidney disease Mother    Obesity Mother    Cancer Maternal Aunt        lung   Cancer Maternal Uncle        lung   Throat cancer Maternal Aunt     ROS: no fevers or chills, productive cough, hemoptysis, dysphasia, odynophagia, melena, hematochezia, dysuria, hematuria, rash, seizure activity, orthopnea, PND, pedal edema, claudication. Remaining systems are negative.  Physical Exam: Well-developed well-nourished in no acute distress.  Skin is warm and dry.  HEENT is normal.  Neck is supple.  Chest is clear to auscultation with normal expansion.  Cardiovascular exam is regular rate and rhythm.  Abdominal exam nontender or distended. No masses palpated. Extremities show no edema. neuro grossly intact  ECG- personally reviewed  A/P  1 coronary artery disease-patient denies recurrent chest pain.  Continue medical therapy including aspirin and statin.  2 hypertension-patient's blood pressure is controlled  today.  Continue present medications and follow.  3 hyperlipidemia-continue statin.  Olga Millers, MD

## 2022-08-17 ENCOUNTER — Ambulatory Visit: Payer: BC Managed Care – PPO | Attending: Cardiology | Admitting: Cardiology

## 2022-08-17 ENCOUNTER — Encounter: Payer: Self-pay | Admitting: Cardiology

## 2022-08-17 VITALS — BP 132/88 | HR 66 | Ht 66.0 in | Wt 277.4 lb

## 2022-08-17 DIAGNOSIS — E7800 Pure hypercholesterolemia, unspecified: Secondary | ICD-10-CM

## 2022-08-17 DIAGNOSIS — I10 Essential (primary) hypertension: Secondary | ICD-10-CM

## 2022-08-17 DIAGNOSIS — I2510 Atherosclerotic heart disease of native coronary artery without angina pectoris: Secondary | ICD-10-CM

## 2022-08-17 NOTE — Patient Instructions (Signed)

## 2022-08-19 ENCOUNTER — Other Ambulatory Visit: Payer: Self-pay | Admitting: Cardiology

## 2022-10-12 ENCOUNTER — Other Ambulatory Visit: Payer: Self-pay | Admitting: Internal Medicine

## 2022-10-12 ENCOUNTER — Other Ambulatory Visit: Payer: Self-pay | Admitting: Cardiology

## 2022-10-18 ENCOUNTER — Encounter: Payer: Self-pay | Admitting: Internal Medicine

## 2022-10-18 NOTE — Progress Notes (Unsigned)
Virtual Visit via Video Note  I connected with Roberta [name] on 10/18/22 at  9:10 AM EDT by a video enabled telemedicine application and verified that I am speaking with the correct person using two identifiers.   I discussed the limitations of evaluation and management by telemedicine and the availability of in person appointments. The patient expressed understanding and agreed to proceed.  Present for the visit:  Myself, Dr Cheryll Cockayne, Carolanne Grumbling.  The patient is currently at home and I am in the office.    No referring provider.    History of Present Illness: She is here for an acute visit for stiffness  She has been wearing slim patches - started 5 days ago - wears it 24 hrs.  She wore is Sat, Sun and Mon.  Monday she was on the sofa on the phone and her left leg stiffened up.  She ate some mustard.  Her nephew had to help her up to get to the bed.  She took off the patch because she was not sure if it was a side effect from it.  She went to work yesterday and had to use a cane to walk.  She is still experiencing stiffness-has not improved much.  The stiffness starts in the left knee and goes up towards the thigh.  She denies any groin pain or back pain.  She denies any swelling, numbness or tingling.  The patches all-natural.  She did look up all the ingredients.  She has not used it since Monday because she was not sure if it was the cause of her symptoms.  She denies any new or unusual activities or injuries.  She has been taking ibuprofen and Tylenol which has helped some with the pain.     Social History   Socioeconomic History   Marital status: Divorced    Spouse name: Not on file   Number of children: Not on file   Years of education: Not on file   Highest education level: Bachelor's degree (e.g., BA, AB, BS)  Occupational History   Not on file  Tobacco Use   Smoking status: Never   Smokeless tobacco: Never  Vaping Use   Vaping status: Never Used  Substance  and Sexual Activity   Alcohol use: Yes    Comment: occasional wine   Drug use: No   Sexual activity: Not Currently  Other Topics Concern   Not on file  Social History Narrative   Not on file   Social Determinants of Health   Financial Resource Strain: Low Risk  (04/20/2022)   Overall Financial Resource Strain (CARDIA)    Difficulty of Paying Living Expenses: Not hard at all  Food Insecurity: No Food Insecurity (04/20/2022)   Hunger Vital Sign    Worried About Running Out of Food in the Last Year: Never true    Ran Out of Food in the Last Year: Never true  Transportation Needs: No Transportation Needs (04/20/2022)   PRAPARE - Administrator, Civil Service (Medical): No    Lack of Transportation (Non-Medical): No  Physical Activity: Insufficiently Active (04/20/2022)   Exercise Vital Sign    Days of Exercise per Week: 3 days    Minutes of Exercise per Session: 30 min  Stress: No Stress Concern Present (04/20/2022)   [employer] of Occupational Health - Occupational Stress Questionnaire    Feeling of Stress : Not at all  Social Connections: Moderately Integrated (04/20/2022)   Social Connection and  Isolation Panel [NHANES]    Frequency of Communication with Friends and Family: More than three times a week    Frequency of Social Gatherings with Friends and Family: Twice a week    Attends Religious Services: More than 4 times per year    Active Member of [employer] or Organizations: Yes    Attends Engineer, structural: More than 4 times per year    Marital Status: Divorced     Observations/Objective: Appears well in NAD   Assessment and Plan:  See Problem List for Assessment and Plan of chronic medical problems.   Follow Up Instructions:    I discussed the assessment and treatment plan with the patient. The patient was provided an opportunity to ask questions and all were answered. The patient agreed with the plan and demonstrated an understanding of the  instructions.   The patient was advised to call back or seek an in-person evaluation if the symptoms worsen or if the condition fails to improve as anticipated.    Pincus Sanes, MD

## 2022-10-19 ENCOUNTER — Telehealth: Payer: BC Managed Care – PPO | Admitting: Internal Medicine

## 2022-10-19 DIAGNOSIS — M25669 Stiffness of unspecified knee, not elsewhere classified: Secondary | ICD-10-CM | POA: Diagnosis not present

## 2022-10-19 NOTE — Assessment & Plan Note (Signed)
Acute States stiffness and some pain of her left knee and vomiting that started 3 days ago for no specific reason She was concerned it was a side effect from the weight loss patches she was using, but this is so localized I do not think it is related to the patches She does have known mild-moderate tricompartmental osteoarthritis of the knee and that seems more likely to be the cause ?  Why the knee got inflamed Discussed seeing sports medicine for further evaluation-she would like to hold off and continue conservative treatment right now Continue ibuprofen/Tylenol She can ice or apply topical medications to the knee to see if that helps If no improvement I would recommend that she sees sports medicine for?  Flare of OA of the knee-she will let me know if 2 days how she is feeling

## 2022-10-21 ENCOUNTER — Encounter: Payer: Self-pay | Admitting: Internal Medicine

## 2022-10-24 ENCOUNTER — Other Ambulatory Visit: Payer: Self-pay

## 2022-10-24 ENCOUNTER — Ambulatory Visit: Payer: BC Managed Care – PPO | Admitting: Family Medicine

## 2022-10-24 ENCOUNTER — Ambulatory Visit (INDEPENDENT_AMBULATORY_CARE_PROVIDER_SITE_OTHER): Payer: BC Managed Care – PPO

## 2022-10-24 VITALS — BP 122/76 | HR 74 | Ht 66.0 in | Wt 280.0 lb

## 2022-10-24 DIAGNOSIS — G8929 Other chronic pain: Secondary | ICD-10-CM

## 2022-10-24 DIAGNOSIS — M25561 Pain in right knee: Secondary | ICD-10-CM | POA: Diagnosis not present

## 2022-10-24 NOTE — Patient Instructions (Addendum)
Thank you for coming in today.   Please get an Xray today before you leave   I recommend you obtained a compression sleeve to help with your joint problems. There are many options on the market however I recommend obtaining a Full Thigh Body Helix compression sleeve.  You can find information (including how to appropriate measure yourself for sizing) can be found at www.Body [email or web address].ch.  Many of these products are health savings account (HSA) eligible.  You can use the compression sleeve at any time throughout the day but is most important to use while being active as well as for 2 hours post-activity.   It is appropriate to ice following activity with the compression sleeve in place.   We attempted an aspiration and injection today. Seek immediate medical attention if the joint becomes red, extremely painful, or is oozing fluid.   Check back in 2 weeks

## 2022-10-24 NOTE — Progress Notes (Signed)
Rubin Payor, PhD, LAT, ATC acting as a scribe for Clementeen Graham, MD.  Roberta [name] is a 57 y.o. female who presents to [facility] today for R leg pain. Pt was previously seen by Dr. Denyse Amass in 2023 for R knee pain.  Today, pt c/o R leg pain ongoing since last Monday. Pain started on the anterior aspect of her R thigh, she was just sitting on the couch, felt like a cramp. Saturday she did a lot of walking, attending the parade. Pt locates pain to anterior R thigh, all around her R knee.  Low back pain: no Radiating pain: no LE numbness/tingling: no LE weakness: yes Aggravates: getting into/out of car, stairs,  Treatments tried: mustard, ice, heat, IBU, sleep aid, Tylenol  Pertinent review of systems: No fevers or chills  Relevant historical information: Coronary artery disease.  History of Roux-en-Y gastric bypass.  Takes aspirin 81 mg.   Exam:  BP 122/76   Pulse 74   Ht 5\' 6"  (1.676 m)   Wt 280 lb (127 kg)   LMP 09/04/2018 (Exact Date)   SpO2 96%   BMI 45.19 kg/m  General: Well Developed, well nourished, and in no acute distress.   MSK: Right knee some swelling superior patellar space.  Bruising present anterior medial knee. Range of motion lacks full flexion.  Tender palpation superior knee quadriceps muscle belly. Intact strength pain is present with resisted knee extension.   Lab and Radiology Results  Procedure: Real-time Ultrasound Guided aspiration attempt hemarthrosis or large hematoma superior patellar space. Device: Philips Affiniti 50G/GE Logiq Images permanently stored and available for review in PACS Ultrasound evaluation prior to aspiration shows a large hypoechoic structure deep to the quadriceps tendon that could represent hemarthrosis or hematoma. Verbal informed consent obtained.  Discussed risks and benefits of procedure. Warned about infection, bleeding, hyperglycemia damage to structures among others. Patient  expresses understanding and agreement Time-out conducted.   Noted no overlying erythema, induration, or other signs of local infection.   Skin prepped in a sterile fashion.   Local anesthesia: Topical Ethyl chloride.   With sterile technique and under real time ultrasound guidance: 3 mL of lidocaine injected into subcutaneous tissue achieving good anesthesia..  Skin again sterilized with isopropyl alcohol and an 18-gauge needle was used to access the hematoma or hemarthrosis. Small amount of dark red blood was aspirated.  No further aspiration was successful despite needle repositioning attempts. Decision was made not to proceed with planned steroid injection.  Advised to call if fevers/chills, erythema, induration, drainage, or persistent bleeding.   Images permanently stored and available for review in the ultrasound unit.  Impression: Aspiration intact hematoma right knee   X-ray images right knee obtained today personally and independently interpreted Patellofemoral DJD.  No acute fractures are visible. Await formal radiology review     Assessment and Plan: 57 y.o. female with right thigh pain and swelling without injury.  Clinically this looks like a distal quadriceps muscle tear or some other explanation that would cause pain and a hemarthrosis but is a fracture.  Plan for compression and recheck in 2 weeks.  We talked about pain management.  For now Tylenol is sufficient but could prescribe stronger medicines if needed.   PDMP not reviewed this encounter. Orders Placed This Encounter  Procedures   [place] LIMITED JOINT SPACE STRUCTURES LOW RIGHT(NO LINKED CHARGES)    Order Specific Question:   Reason for Exam (SYMPTOM  OR DIAGNOSIS REQUIRED)  Answer:   right knee pain    Order Specific Question:   Preferred imaging location?    Answer:    [facility] Knee AP/LAT W/Sunrise Right    Standing Status:   Future    Number of Occurrences:   1    Standing  Expiration Date:   11/24/2022    Order Specific Question:   Reason for Exam (SYMPTOM  OR DIAGNOSIS REQUIRED)    Answer:   right knee pain    Order Specific Question:   Preferred imaging location?    Answer:   [facility]    Order Specific Question:   Is patient pregnant?    Answer:   No   No orders of the defined types were placed in this encounter.    Discussed warning signs or symptoms. Please see discharge instructions. Patient expresses understanding.   The above documentation has been reviewed and is accurate and complete Clementeen Graham, M.D.

## 2022-11-02 ENCOUNTER — Encounter: Payer: Self-pay | Admitting: Family Medicine

## 2022-11-07 ENCOUNTER — Inpatient Hospital Stay: Payer: BC Managed Care – PPO | Attending: Hematology and Oncology | Admitting: Hematology and Oncology

## 2022-11-07 ENCOUNTER — Other Ambulatory Visit: Payer: Self-pay

## 2022-11-07 ENCOUNTER — Encounter: Payer: Self-pay | Admitting: Family Medicine

## 2022-11-07 ENCOUNTER — Inpatient Hospital Stay: Payer: BC Managed Care – PPO

## 2022-11-07 ENCOUNTER — Ambulatory Visit: Payer: BC Managed Care – PPO | Admitting: Family Medicine

## 2022-11-07 ENCOUNTER — Encounter: Payer: Self-pay | Admitting: Hematology and Oncology

## 2022-11-07 VITALS — BP 158/81 | HR 73 | Temp 97.5°F | Resp 18 | Wt 275.6 lb

## 2022-11-07 VITALS — BP 146/92 | HR 86 | Ht 66.0 in | Wt 272.0 lb

## 2022-11-07 DIAGNOSIS — G8929 Other chronic pain: Secondary | ICD-10-CM

## 2022-11-07 DIAGNOSIS — M2500 Hemarthrosis, unspecified joint: Secondary | ICD-10-CM | POA: Diagnosis not present

## 2022-11-07 DIAGNOSIS — M25561 Pain in right knee: Secondary | ICD-10-CM | POA: Diagnosis not present

## 2022-11-07 DIAGNOSIS — Z170 Estrogen receptor positive status [ER+]: Secondary | ICD-10-CM | POA: Insufficient documentation

## 2022-11-07 DIAGNOSIS — Z79811 Long term (current) use of aromatase inhibitors: Secondary | ICD-10-CM | POA: Diagnosis not present

## 2022-11-07 DIAGNOSIS — C50212 Malignant neoplasm of upper-inner quadrant of left female breast: Secondary | ICD-10-CM

## 2022-11-07 DIAGNOSIS — Z9012 Acquired absence of left breast and nipple: Secondary | ICD-10-CM | POA: Insufficient documentation

## 2022-11-07 LAB — CBC WITH DIFFERENTIAL/PLATELET
Abs Immature Granulocytes: 0.01 10*3/uL (ref 0.00–0.07)
Basophils Absolute: 0 10*3/uL (ref 0.0–0.1)
Basophils Relative: 1 %
Eosinophils Absolute: 0.2 10*3/uL (ref 0.0–0.5)
Eosinophils Relative: 4 %
HCT: 40.5 % (ref 36.0–46.0)
Hemoglobin: 13.1 g/dL (ref 12.0–15.0)
Immature Granulocytes: 0 %
Lymphocytes Relative: 34 %
Lymphs Abs: 1.7 10*3/uL (ref 0.7–4.0)
MCH: 27.3 pg (ref 26.0–34.0)
MCHC: 32.3 g/dL (ref 30.0–36.0)
MCV: 84.6 fL (ref 80.0–100.0)
Monocytes Absolute: 0.5 10*3/uL (ref 0.1–1.0)
Monocytes Relative: 10 %
Neutro Abs: 2.6 10*3/uL (ref 1.7–7.7)
Neutrophils Relative %: 51 %
Platelets: 270 10*3/uL (ref 150–400)
RBC: 4.79 MIL/uL (ref 3.87–5.11)
RDW: 13.6 % (ref 11.5–15.5)
WBC: 5 10*3/uL (ref 4.0–10.5)
nRBC: 0 % (ref 0.0–0.2)

## 2022-11-07 LAB — CMP (CANCER CENTER ONLY)
ALT: 20 U/L (ref 0–44)
AST: 24 U/L (ref 15–41)
Albumin: 3.6 g/dL (ref 3.5–5.0)
Alkaline Phosphatase: 101 U/L (ref 38–126)
Anion gap: 4 — ABNORMAL LOW (ref 5–15)
BUN: 17 mg/dL (ref 6–20)
CO2: 31 mmol/L (ref 22–32)
Calcium: 9.2 mg/dL (ref 8.9–10.3)
Chloride: 103 mmol/L (ref 98–111)
Creatinine: 1.01 mg/dL — ABNORMAL HIGH (ref 0.44–1.00)
GFR, Estimated: >60 mL/min (ref >60)
Glucose, Bld: 99 mg/dL (ref 70–99)
Potassium: 3.9 mmol/L (ref 3.5–5.1)
Sodium: 138 mmol/L (ref 135–145)
Total Bilirubin: 0.8 mg/dL (ref <1.2)
Total Protein: 7.6 g/dL (ref 6.5–8.1)

## 2022-11-07 NOTE — Progress Notes (Signed)
[facility]  [phone] [phone]) [phone]    ID: Roberta [name] DOB: February 06, 1965  MR#: [id number]  FIE#:[id number]  Patient Care Team: Pincus Sanes, MD as PCP - General (Internal Medicine) Jens Som Madolyn Frieze, MD as PCP - Cardiology (Cardiology) Magrinat, Valentino Hue, MD (Inactive) as Consulting Physician (Oncology) Dorothy Puffer, MD as Consulting Physician (Radiation Oncology) Charna Elizabeth, MD as Consulting Physician (Gastroenterology) Edwinna Areola, DO as Consulting Physician (Obstetrics and Gynecology)   CHIEF COMPLAINT: 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 Roberta [name] is here for follow-up by herself. Since last visit, she is still working on the weight. She had a severe spasm of RLE, wearing a right leg brace for 2 weeks. She is still woking on losing weight, now following mediterranean diet. She is also having to wear lymphedema sleeve of left arm more often. She is hoping to get 30 lbs before 60. Most recent mammogram Aug 2024 neg for malignancy. She is looking forward to retire before Christmas of 2026.  She is overall doing well except for the leg issue in the right thigh.  Rest of the pertinent 10 point ROS reviewed and negative  HISTORY OF CURRENT ILLNESS:  From the original intake note:  Roberta [name] had routine screening mammography on 07/25/2018 which was normal.  Less than a month later she was found by Dr. Mindi Slicker to have palpable lumps in the superior left breast at a clinic exam.  Ola underwent unilateral left diagnostic mammography with tomography and left breast ultrasonography at [facility] on 08/23/2018 showing: Breast Density Category C. No suspicious masses are seen on the tomosynthesis images of the left breast. There are 2 groups of indeterminate  calcifications however, one in the superior anterior superficial left breast which spans 8 mm. In the upper inner left breast, there is a 2.2 cm area of loosely grouped amorphous calcifications. Normal fibroglandular tissue in the upper inner and upper outer quadrants of the left breast.  There were no sonographic abnormalities to correspond with the masses identified on the clinical breast exam.  Accordingly on 08/29/2018 she proceeded to stereotactic biopsy of the left breast area in question. The pathology from this procedure showed ([id number]): invasive mammary carcinoma, nottingham grade 2 of 3. Prognostic indicators significant for: estrogen receptor, 100% positive and progesterone receptor, 2% positive, both with strong staining intensity. Proliferation marker Ki67 at 10%. HER2 negative (0+) by immunohistochemistry.  An additional biopsy of the left breast was performed on the same day. The pathology from this procedure showed ([id number]): ductal carcinoma in situ. Prognostic indicators significant for: estrogen receptor, 95% positie and progesterone receptor, 100% positive, both with strong staining intensity.  Finally, she underwent a left axillary ultrasound on 09/02/2018 showing: No left axillary adenopathy.  The patient's subsequent history is as detailed below.  PAST MEDICAL HISTORY: Past Medical History:  Diagnosis Date   AMI (acute myocardial infarction) (HCC)    Back pain    Cancer (HCC)    multifocal left breast   Coronary artery disease    Depression    Hip pain    Hypertension    Obesity    PONV (postoperative nausea and vomiting)    Prediabetes    Swelling    bilat LE   Wears glasses     PAST SURGICAL HISTORY: Past Surgical History:  Procedure Laterality Date  CHOLECYSTECTOMY     COLONOSCOPY     CORONARY BALLOON ANGIOPLASTY N/A 12/11/2019   Procedure: CORONARY BALLOON ANGIOPLASTY;  Surgeon: Yvonne Kendall, MD;  Location: [facility];  Service:  Cardiovascular;  Laterality: N/A;   DILATION AND CURETTAGE OF UTERUS     GASTRIC BYPASS     KNEE SURGERY  2002   Ligament repair   LEFT HEART CATH AND CORONARY ANGIOGRAPHY N/A 12/11/2019   Procedure: LEFT HEART CATH AND CORONARY ANGIOGRAPHY;  Surgeon: Yvonne Kendall, MD;  Location: [facility];  Service: Cardiovascular;  Laterality: N/A;   MASTECTOMY Left 2019   MASTECTOMY W/ SENTINEL NODE BIOPSY Left 10/01/2018   Procedure: LEFT TOTAL MASTECTOMY WITH SENTINEL LYMPH NODE BIOPSY AND BLUE DYE INJECTION;  Surgeon: Claud Kelp, MD;  Location: [facility] OR;  Service: General;  Laterality: Left;    FAMILY HISTORY: Family History  Problem Relation Age of Onset   Diabetes Mother    Hypertension Mother    Heart disease Mother    Stroke Mother    Kidney disease Mother    Obesity Mother    Cancer Maternal Aunt        lung   Cancer Maternal Uncle        lung   Throat cancer Maternal Aunt   Roberta [name]'s father died in his mid 70s from alcoholism.  The patient's mother died at age 109 from end-stage renal disease.  The patient had 2 brothers, 3 sisters, one brother dying from complications of diabetes.  There is no cancer in the immediate family.  There are 2 maternal uncles and 2 maternal aunts who died from lung cancer, all of whom smoked.  A maternal aunt also had a history of throat cancer.   GYNECOLOGIC HISTORY:  Patient's last menstrual period was 09/04/2018 (exact date). 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  She is divorced, lives alone.   ADVANCED DIRECTIVES: She intends to name her sister Ronnald Nian as healthcare power of attorney.  Nicole Cella can be reached at [phone].   HEALTH MAINTENANCE: Social History    Tobacco Use   Smoking status: Never   Smokeless tobacco: Never  Vaping Use   Vaping status: Never Used  Substance Use Topics   Alcohol use: Yes    Comment: occasional wine   Drug use: No    Colonoscopy: August 2020  PAP: Up-to-date  Bone density: 2005   Allergies  Allergen Reactions   Lisinopril Swelling    Swelling of the tongue   Amlodipine    Penicillins     UNSPECIFIED CHILDHOOD REACTION   Tetanus Toxoid    Imdur [Isosorbide Nitrate] Other (See Comments)    headache   Norvasc [Amlodipine Besylate] Rash   Red Dye #40 (Allura Red) Rash   Tetanus Toxoids Swelling    Swelling at site of injection    Current Outpatient Medications  Medication Sig Dispense Refill   anastrozole (ARIMIDEX) 1 MG tablet Take 1 tablet (1 mg total) by mouth daily. 90 tablet 4   aspirin EC 81 MG EC tablet Take 1 tablet (81 mg total) by mouth daily. Swallow whole. 30 tablet 11   atorvastatin (LIPITOR) 80 MG tablet TAKE 1 TABLET BY MOUTH EVERY DAY 90 tablet 3  CINNAMON PO Take 2 capsules by mouth daily.     Ferrous Sulfate (IRON PO) Take 65 mg by mouth daily.     furosemide (LASIX) 20 MG tablet TAKE 1 TABLET BY MOUTH EVERY DAY 90 tablet 1   metoprolol tartrate (LOPRESSOR) 25 MG tablet TAKE 1/2 TABLET TWICE A DAY BY MOUTH 90 tablet 2   Multiple Vitamin (MULTIVITAMIN) tablet Take 1 tablet by mouth daily.     nitroGLYCERIN (NITROSTAT) 0.4 MG SL tablet Place 1 tablet (0.4 mg total) under the tongue every 5 (five) minutes x 3 doses as needed for chest pain. 25 tablet 5   No current facility-administered medications for this visit.    OBJECTIVE: African-American woman in no acute distress  Vitals:   11/07/22 0902  BP: (!) 158/81  Pulse: 73  Resp: 18  Temp: (!) 97.5 F (36.4 C)  SpO2: 100%    Wt Readings from Last 3 Encounters:  11/07/22 275 lb 9.6 oz (125 kg)  10/24/22 280 lb (127 kg)  08/17/22 277 lb 6.4 oz (125.8 kg)   Body mass index is 44.48 kg/m.    ECOG FS:1 - Symptomatic  but completely ambulatory  Physical Exam Constitutional:      Appearance: Normal appearance.  Chest:     Comments: Status post left mastectomy.  No evidence of recurrence in the left mastectomy bed.  Right breast with no evidence of masses.  No palpable regional adenopathy. Musculoskeletal:     Cervical back: Normal range of motion and neck supple. No rigidity.  Lymphadenopathy:     Cervical: No cervical adenopathy.  Neurological:     Mental Status: She is alert.     LAB RESULTS:  CMP     Component Value Date/Time   NA 138 06/21/2022 1359   NA 141 07/01/2021 0949   K 3.6 06/21/2022 1359   CL 101 06/21/2022 1359   CO2 29 06/21/2022 1359   GLUCOSE 86 06/21/2022 1359   BUN 17 06/21/2022 1359   BUN 15 07/01/2021 0949   CREATININE 0.95 06/21/2022 1359   CREATININE 1.20 (H) 07/29/2021 1302   CALCIUM 9.3 06/21/2022 1359   PROT 7.9 06/21/2022 1359   PROT 6.7 07/01/2021 0949   ALBUMIN 3.8 06/21/2022 1359   ALBUMIN 3.7 (L) 07/01/2021 0949   AST 31 06/21/2022 1359   AST 22 07/29/2021 1302   ALT 29 06/21/2022 1359   ALT 19 07/29/2021 1302   ALKPHOS 114 06/21/2022 1359   BILITOT 0.7 06/21/2022 1359   BILITOT 0.7 07/29/2021 1302   GFRNONAA 55 (L) 02/03/2022 1352   GFRNONAA 52 (L) 07/29/2021 1302   GFRAA >60 09/23/2019 1441   GFRAA >60 09/04/2018 0840    No results found for: "TOTALPROTELP", "ALBUMINELP", "A1GS", "A2GS", "BETS", "BETA2SER", "GAMS", "MSPIKE", "SPEI"  No results found for: "KPAFRELGTCHN", "LAMBDASER", "KAPLAMBRATIO"  Lab Results  Component Value Date   WBC 4.2 06/21/2022   NEUTROABS 1.8 06/21/2022   HGB 14.0 06/21/2022   HCT 42.7 06/21/2022   MCV 84.6 06/21/2022   PLT 196.0 06/21/2022    No results found for: "LABCA2"  No components found for: "IHKVQQ595"  No results for input(s): "INR" in the last 168 hours.  No results found for: "LABCA2"  No results found for: "GLO756"  No results found for: "CAN125"  No results found for: "CAN153"  No  results found for: "CA2729"  No components found for: "HGQUANT"  No results found for: "CEA1", "CEA" / No results found for: "CEA1", "CEA"   No results  found for: "AFPTUMOR"  No results found for: "CHROMOGRNA"  No results found for: "HGBA", "HGBA2QUANT", "HGBFQUANT", "HGBSQUAN" (Hemoglobinopathy evaluation)   No results found for: "LDH"  Lab Results  Component Value Date   IRON 23 (L) 11/09/2020   TIBC 359.8 11/09/2020   IRONPCTSAT 6.4 (L) 11/09/2020   (Iron and TIBC)  Lab Results  Component Value Date   FERRITIN 8.6 (L) 11/09/2020    Urinalysis    Component Value Date/Time   COLORURINE YELLOW 11/09/2020 1614   APPEARANCEUR Sl Cloudy (A) 11/09/2020 1614   LABSPEC >=1.030 (A) 11/09/2020 1614   PHURINE 5.5 11/09/2020 1614   GLUCOSEU NEGATIVE 11/09/2020 1614   HGBUR NEGATIVE 11/09/2020 1614   BILIRUBINUR NEGATIVE 11/09/2020 1614   KETONESUR NEGATIVE 11/09/2020 1614   PROTEINUR 100 (A) 06/25/2007 0640   UROBILINOGEN 1.0 11/09/2020 1614   NITRITE NEGATIVE 11/09/2020 1614   LEUKOCYTESUR MODERATE (A) 11/09/2020 1614    STUDIES:  No results found.   ELIGIBLE FOR AVAILABLE RESEARCH PROTOCOL: no   ASSESSMENT: 57 y.o. [place], [place] woman status post left breast upper inner quadrant biopsy for a clinical T2 N0, stage IB invasive ductal carcinoma, grade 2, estrogen and progesterone receptor positive, HER-2 not amplified, with an MIB-1-1 of 2%  (a) a second biopsy same day different quadrant showed ductal carcinoma in situ  (1) left breast s/p mastectomy on 10/01/2018 for a  pT1b pN0, stage IA invasive ductal carcinoma, grade 2, margins negative.  (a) a total of 5 lymph nodes biopsied were negative.    (2) Oncotype score of 20 predicts a risk of recurrence outside the breast within the next 9 years of 6% if her only systemic therapy is antiestrogens for 5 years.  Also predicts no benefit from chemotherapy.  (3) adjuvant radiation not indicated  (4)  tamoxifen started  November 2020  (a) switched to anastrozole beginning March 2022 following NSTEMI and angioplasty December 2021   PLAN:  Breast Cancer On Anastrozole with completion of treatment expected in November 2025. No new symptoms reported. Mammogram in July and August 2024 were normal. -Continue Anastrozole as prescribed. -Next mammogram scheduled for July 2025.  Muscle Hemorrhage Recent incident of thigh muscle bleeding, currently managed with a leg brace.  -Continue current management and follow-up with respective team.  Lymphedema Increased use of compression sleeve reported. -Continue use of compression sleeve as needed.  Bone Health No recent bone density scan, potential risk due to menopause and Anastrozole use. -Order bone density scan at patient's convenience.  Weight Management Patient actively working towards weight loss goal with a Mediterranean diet. -Encourage continued adherence to diet and exercise regimen.  General Health Maintenance -Order labs today as they have expired. -Follow-up appointment in July or August 2025.  Return to clinic in 1 year or sooner as needed   Total time spent: 30 minutes *Total Encounter Time as defined by the Centers for Medicare and Medicaid Services includes, in addition to the face-to-face time of a patient visit (documented in the note above) non-face-to-face time: obtaining and reviewing outside history, ordering and reviewing medications, tests or procedures, care coordination (communications with other health care professionals or caregivers) and documentation in the medical record.

## 2022-11-07 NOTE — Patient Instructions (Signed)
Thank you for coming in today.   You should hear from MRI scheduling within 1 week. If you do not hear please let me know.    Continue the compression.

## 2022-11-07 NOTE — Progress Notes (Signed)
   I, Stevenson Clinch, CMA acting as a scribe for Clementeen Graham, MD.  Roberta [name] is a 57 y.o. female who presents to [facility] at [facility] today for f/u R knee and thigh pain. Pt was last seen by Dr. Denyse Amass on 10/24/22 and was advised to use compression and Tylenol.  Today, pt reports continued right knee pain. Notes bruising at injection site. Also having pain at injection site, worse with knee flexion. 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 Notes climbing the stairs for the 1st time in weeks.   Dx imaging: 10/24/22 R knee XR  Pertinent review of systems: No fevers or chills  Relevant historical information: History of breast cancer   Exam:  BP (!) 146/92   Pulse 86   Ht 5\' 6"  (1.676 m)   Wt 272 lb (123.4 kg)   LMP 09/04/2018 (Exact Date)   SpO2 98%   BMI 43.90 kg/m  General: Well Developed, well nourished, and in no acute distress.   MSK: Right knee effusion present superior patellar space.  Tender palpation distal quadriceps.  Lacks full flexion.  Pain with resisted knee extension although strength is moderately preserved    Lab and Radiology Results  Diagnostic Limited MSK Ultrasound of: Right knee Distal quadricep tendon is intact.  Deep to the quad tendon there is a large hypoechoic structure that is either hematoma or hemarthrosis.  This is similar to previous ultrasound on October 22. Impression: Continued hematoma or hemarthrosis      Assessment and Plan: 57 y.o. female with right distal quad muscle tear or hemarthrosis.  This has not improved considerably in the last 2 weeks with conservative management.  Plan for MRI to further evaluate for potential partial quad tear or other cause of hemarthrosis such as occult fracture.   PDMP not reviewed this encounter. Orders Placed This Encounter  Procedures   [place] LIMITED JOINT SPACE STRUCTURES LOW RIGHT(NO  LINKED CHARGES)    Order Specific Question:   Reason for Exam (SYMPTOM  OR DIAGNOSIS REQUIRED)    Answer:   eval knee    Order Specific Question:   Preferred imaging location?    Answer:   [facility]   MR Knee Right Wo Contrast    Standing Status:   Future    Standing Expiration Date:   11/07/2023    Order Specific Question:   What is the patient's sedation requirement?    Answer:   No Sedation    Order Specific Question:   Does the patient have a pacemaker or implanted devices?    Answer:   No    Order Specific Question:   Preferred imaging location?    Answer:   [facility] (table limit-550lbs)   No orders of the defined types were placed in this encounter.    Discussed warning signs or symptoms. Please see discharge instructions. Patient expresses understanding.   The above documentation has been reviewed and is accurate and complete Clementeen Graham, M.D.

## 2022-11-08 ENCOUNTER — Encounter: Payer: Self-pay | Admitting: Family Medicine

## 2022-11-08 NOTE — Progress Notes (Signed)
Right knee x-ray shows medium arthritis

## 2022-11-11 ENCOUNTER — Other Ambulatory Visit: Payer: BC Managed Care – PPO

## 2022-11-11 ENCOUNTER — Inpatient Hospital Stay
Admission: RE | Admit: 2022-11-11 | Discharge: 2022-11-11 | Discharge status: Home / Self Care | Payer: BC Managed Care – PPO | Source: Ambulatory Visit | Attending: Family Medicine | Admitting: Family Medicine

## 2022-11-11 DIAGNOSIS — G8929 Other chronic pain: Secondary | ICD-10-CM

## 2022-11-11 DIAGNOSIS — M2500 Hemarthrosis, unspecified joint: Secondary | ICD-10-CM

## 2022-11-22 ENCOUNTER — Encounter: Payer: Self-pay | Admitting: Family Medicine

## 2022-11-22 NOTE — Telephone Encounter (Signed)
Called and discussed XR results. She was confused because the MRI has not yet been read. I told her I would call radiology and request a read and Dr. Denyse Amass would then get the MRI results soon and then we would plan our next step from there.

## 2022-11-23 NOTE — Progress Notes (Signed)
No fracture seen on MRI.  The radiologist pick up a large area of swelling in the knee that is probably blood.  The way it looks it is possible that this is  pigmented villonodular synovitis.  There is no fracture but there is a fair amount of arthritis which is probably the source of the bleeding and swelling.  Recommend return to clinic to go over the results in full detail and potentially aspirate the fluid if we can and potentially do a cortisone shot.

## 2022-11-28 ENCOUNTER — Encounter: Payer: Self-pay | Admitting: Family Medicine

## 2022-11-28 ENCOUNTER — Ambulatory Visit: Payer: BC Managed Care – PPO | Admitting: Family Medicine

## 2022-11-28 ENCOUNTER — Other Ambulatory Visit: Payer: Self-pay

## 2022-11-28 VITALS — BP 128/84 | HR 74 | Ht 66.0 in | Wt 273.0 lb

## 2022-11-28 DIAGNOSIS — M2500 Hemarthrosis, unspecified joint: Secondary | ICD-10-CM

## 2022-11-28 DIAGNOSIS — M25561 Pain in right knee: Secondary | ICD-10-CM | POA: Diagnosis not present

## 2022-11-28 DIAGNOSIS — G8929 Other chronic pain: Secondary | ICD-10-CM | POA: Diagnosis not present

## 2022-11-28 NOTE — Progress Notes (Signed)
I, Stevenson Clinch, CMA acting as a scribe for Clementeen Graham, MD.  Roberta [name] is a 57 y.o. female who presents to [facility] at [facility] today for f/u R knee pain w/ MRI review. Pt was last seen by Dr. Denyse Amass on 11/07/22 and she was advised to proceed to MRI.  Today, pt reports worsening knee sx after walking the [place] the [employer] [place] last [place]. MRI review today.   Dx imaging: 11/11/22 R knee MRI 10/24/22 R knee XR   Pertinent review of systems: No fevers or chills  Relevant historical information: History NSTEMI   Exam:  BP 128/84   Pulse 74   Ht 5\' 6"  (1.676 m)   Wt 273 lb (123.8 kg)   LMP 09/04/2018 (Exact Date)   SpO2 98%   BMI 44.06 kg/m  General: Well Developed, well nourished, and in no acute distress.   MSK: Right knee mild effusion normal motion with crepitation.    Lab and Radiology Results  Diagnostic Limited MSK Ultrasound of: Right knee Large effusion/hematoma present in the knee Impression: Effusion still present   EXAM: MRI OF THE RIGHT KNEE WITHOUT CONTRAST   TECHNIQUE: Multiplanar, multisequence MR imaging of the knee was performed. No intravenous contrast was administered.   COMPARISON:  Radiographs 10/24/2022   FINDINGS: MENISCI   Medial meniscus:  Unremarkable   Lateral meniscus:  Unremarkable   LIGAMENTS   Cruciates:  Unremarkable   Collaterals: Mild thickening and accentuated signal proximally in the fibular collateral ligament example on image 20 of series 105, potentially from sprain.   CARTILAGE   Patellofemoral:  Severe chondral thinning.   Medial:  Mild to moderate degenerative chondral thinning.   Lateral:  Mild marginal spurring and mild chondral thinning.   Joint: Moderate to large joint effusion with thickened superior plica with abnormal masslike synovitis along the plica extending in the suprapatellar bursa and beyond the superior margin of imaging, but measuring at  least 7.2 by 2.6 by 5.3 cm. Given the low signal intensity margin, early pigmented villonodular synovitis is not excluded although the masslike region of synovitis has higher signal intensity internally. A cast from a hematoma is a differential diagnostic consideration. Thickened medial plica, image 11 series 103. Low-grade infiltrative edema posteriorly in Hoffa's fat pad.   Popliteal Fossa: Small Baker's cyst. Infiltrative edema in the popliteal space.   Extensor Mechanism:  Mild proximal patellar tendinopathy.   Bones: No significant extra-articular osseous abnormalities identified.   Other: Circumferential subcutaneous edema around the knee.   IMPRESSION: 1. Moderate to large joint effusion with thickened superior plica and abnormal masslike synovitis along the plica extending in the suprapatellar bursa and beyond the superior margin of imaging, but measuring at least 7.2 by 2.6 by 5.3 cm. Given the low signal intensity margin, early pigmented villonodular synovitis is not excluded although the masslike region of synovitis has higher signal intensity internally. A cast from a hematoma is a differential diagnostic consideration. 2. Thickened medial plica. 3. Mild thickening and accentuated signal proximally in the fibular collateral ligament, potentially from sprain. 4. Severe patellofemoral and mild to moderate medial compartment degenerative chondral thinning. 5. Small Baker's cyst. 6. Mild proximal patellar tendinopathy. 7. Circumferential subcutaneous edema around the knee.     Electronically Signed   By: Gaylyn Rong M.D.   On: 11/22/2022 15:45   I, Clementeen Graham, personally (independently) visualized and performed the interpretation of the images attached in this note.    Assessment and  Plan: 57 y.o. female with right knee swelling.  Patient has a large hematoma.  I believe the abnormal signal appearance on MRI is probably as radiologist suspects a cast from  the hematoma.  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  I think a good neck step would be a surgical consultation to evaluate for arthroscopic evacuation of the hematoma and potential biopsy to evaluate for PVNS.  The underlying source of the hematoma is still little bit unclear.  She does not have a good injury history.  I think the significant patellofemoral arthritis is probably the source of the bleeding.   PDMP not reviewed this encounter. Orders Placed This Encounter  Procedures   [place] LIMITED JOINT SPACE STRUCTURES LOW RIGHT(NO LINKED CHARGES)    Order Specific Question:   Reason for Exam (SYMPTOM  OR DIAGNOSIS REQUIRED)    Answer:   right knee pain    Order Specific Question:   Preferred imaging location?    Answer:   [facility] referral to Orthopedic Surgery    Referral Priority:   Routine    Referral Type:   Surgical    Referral Reason:   Specialty Services Required    Referred to Provider:   Huel Cote, MD    Requested Specialty:   Orthopedic Surgery    Number of Visits Requested:   1   No orders of the defined types were placed in this encounter.    Discussed warning signs or symptoms. Please see discharge instructions. Patient expresses understanding.   The above documentation has been reviewed and is accurate and complete Clementeen Graham, M.D.

## 2022-11-28 NOTE — Patient Instructions (Signed)
Thank you for coming in today.   YOu should hear form Dr Steward Drone soon.   Let me know if you have any problems.  Look up PVNS knee

## 2023-01-05 ENCOUNTER — Ambulatory Visit (HOSPITAL_BASED_OUTPATIENT_CLINIC_OR_DEPARTMENT_OTHER): Payer: 59 | Admitting: Orthopaedic Surgery

## 2023-01-05 DIAGNOSIS — M1220 Villonodular synovitis (pigmented), unspecified site: Secondary | ICD-10-CM

## 2023-01-05 NOTE — Progress Notes (Signed)
 Chief Complaint: Right knee     History of Present Illness:    Roberta [name] is a 58 y.o. female presents today with ongoing right knee pain and swelling as a referral from Dr. Joane.  She initially had a significantly swollen right knee which occurred atraumatically.  This was aspirated by Dr. Joane which did show evidence of hematoma/hemarthrosis and she was subsequently sent for an MRI which did reveal evidence of PVNS.  She is here today for further discussion.  She has been using a compression type sleeve.  She does feel much better after the aspiration and at this time is approximately 95% better without significant pain.  She is walking normally without any limitations in mobility    PMH/PSH/Family History/Social History/Meds/Allergies:    Past Medical History:  Diagnosis Date   AMI (acute myocardial infarction) (HCC)    Back pain    Cancer (HCC)    multifocal left breast   Coronary artery disease    Depression    Hip pain    Hypertension    Obesity    PONV (postoperative nausea and vomiting)    Prediabetes    Swelling    bilat LE   Wears glasses    Past Surgical History:  Procedure Laterality Date   CHOLECYSTECTOMY     COLONOSCOPY     CORONARY BALLOON ANGIOPLASTY N/A 12/11/2019   Procedure: CORONARY BALLOON ANGIOPLASTY;  Surgeon: Mady Bruckner, MD;  Location: [facility];  Service: Cardiovascular;  Laterality: N/A;   DILATION AND CURETTAGE OF UTERUS     GASTRIC BYPASS     KNEE SURGERY  2002   Ligament repair   LEFT HEART CATH AND CORONARY ANGIOGRAPHY N/A 12/11/2019   Procedure: LEFT HEART CATH AND CORONARY ANGIOGRAPHY;  Surgeon: Mady Bruckner, MD;  Location: [facility];  Service: Cardiovascular;  Laterality: N/A;   MASTECTOMY Left 2019   MASTECTOMY W/ SENTINEL NODE BIOPSY Left 10/01/2018   Procedure: LEFT TOTAL MASTECTOMY WITH SENTINEL LYMPH NODE BIOPSY AND BLUE DYE INJECTION;  Surgeon: Gail Favorite, MD;  Location: [facility] OR;  Service:  General;  Laterality: Left;   Social History   Socioeconomic History   Marital status: Divorced    Spouse name: Not on file   Number of children: Not on file   Years of education: Not on file   Highest education level: Bachelor's degree (e.g., BA, AB, BS)  Occupational History   Not on file  Tobacco Use   Smoking status: Never   Smokeless tobacco: Never  Vaping Use   Vaping status: Never Used  Substance and Sexual Activity   Alcohol use: Yes    Comment: occasional wine   Drug use: No   Sexual activity: Not Currently  Other Topics Concern   Not on file  Social History Narrative   Not on file   Social Drivers of Health   Financial Resource Strain: Low Risk  (04/20/2022)   Overall Financial Resource Strain (CARDIA)    Difficulty of Paying Living Expenses: Not hard at all  Food Insecurity: No Food Insecurity (04/20/2022)   Hunger Vital Sign    Worried About Running Out of Food in the Last Year: Never true    Ran Out of Food in the Last Year: Never true  Transportation Needs: No Transportation Needs (04/20/2022)   PRAPARE - Administrator, Civil Service (Medical): No    Lack of Transportation (Non-Medical): No  Physical Activity: Insufficiently Active (04/20/2022)  Exercise Vital Sign    Days of Exercise per Week: 3 days    Minutes of Exercise per Session: 30 min  Stress: No Stress Concern Present (04/20/2022)   [employer] of Occupational Health - Occupational Stress Questionnaire    Feeling of Stress : Not at all  Social Connections: Moderately Integrated (04/20/2022)   Social Connection and Isolation Panel [NHANES]    Frequency of Communication with Friends and Family: More than three times a week    Frequency of Social Gatherings with Friends and Family: Twice a week    Attends Religious Services: More than 4 times per year    Active Member of [employer] or Organizations: Yes    Attends Engineer, Structural: More than 4 times per year    Marital  Status: Divorced   Family History  Problem Relation Age of Onset   Diabetes Mother    Hypertension Mother    Heart disease Mother    Stroke Mother    Kidney disease Mother    Obesity Mother    Cancer Maternal Aunt        lung   Cancer Maternal Uncle        lung   Throat cancer Maternal Aunt    Allergies  Allergen Reactions   Lisinopril Swelling    Swelling of the tongue   Amlodipine    Penicillins     UNSPECIFIED CHILDHOOD REACTION   Tetanus Toxoid    Imdur  [Isosorbide  Nitrate] Other (See Comments)    headache   Norvasc [Amlodipine Besylate] Rash   Red Dye #40 (Allura Red) Rash   Tetanus Toxoids Swelling    Swelling at site of injection   Current Outpatient Medications  Medication Sig Dispense Refill   anastrozole  (ARIMIDEX ) 1 MG tablet Take 1 tablet (1 mg total) by mouth daily. 90 tablet 4   aspirin  EC 81 MG EC tablet Take 1 tablet (81 mg total) by mouth daily. Swallow whole. 30 tablet 11   atorvastatin  (LIPITOR ) 80 MG tablet TAKE 1 TABLET BY MOUTH EVERY DAY 90 tablet 3   CINNAMON PO Take 2 capsules by mouth daily.     Ferrous Sulfate (IRON  PO) Take 65 mg by mouth daily.     furosemide  (LASIX ) 20 MG tablet TAKE 1 TABLET BY MOUTH EVERY DAY 90 tablet 1   metoprolol  tartrate (LOPRESSOR ) 25 MG tablet TAKE 1/2 TABLET TWICE A DAY BY MOUTH 90 tablet 2   Multiple Vitamin (MULTIVITAMIN) tablet Take 1 tablet by mouth daily.     nitroGLYCERIN  (NITROSTAT ) 0.4 MG SL tablet Place 1 tablet (0.4 mg total) under the tongue every 5 (five) minutes x 3 doses as needed for chest pain. 25 tablet 5   No current facility-administered medications for this visit.   No results found.  Review of Systems:   A ROS was performed including pertinent positives and negatives as documented in the HPI.  Physical Exam :   Constitutional: NAD and appears stated age Neurological: Alert and oriented Psych: Appropriate affect and cooperative Last menstrual period 09/04/2018.   Comprehensive  Musculoskeletal Exam:    Right knee with no joint line tenderness.  No effusion.  Range of motion is from 0 to 130 degrees without pain.  There is negative crepitus.   Imaging:   Xray (4 views right knee): Normal  MRI (right knee): There is evidence of PVNS with a knee hemarthrosis but no internal derangement   I personally reviewed and interpreted the radiographs.   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  I did discuss that this condition does tend to fluctuate in time.  At this time she is essentially asymptomatic and given that I have recommended no intervention is needed with a compression type sleeve.  We did discuss alternative treatment options including medical management for limited synovectomy with an arthroscopy.  At this time she is overall much better after previous knee aspiration and I did describe that the fact that this is only occurred once in her life is quite a good sign prognostically.  I do believe that it is highly likely that she may not develop a flareup.  I did describe that should she experience a flare that she could reach out to the office for additional discussion.  I will plan to see her back as needed   I personally saw and evaluated the patient, and participated in the management and treatment plan.  Elspeth Parker, MD Attending Physician, Orthopedic Surgery  This document was dictated using Dragon voice recognition software. A reasonable attempt at proof reading has been made to minimize errors.

## 2023-01-08 ENCOUNTER — Ambulatory Visit: Payer: BC Managed Care – PPO | Admitting: Internal Medicine

## 2023-01-12 ENCOUNTER — Other Ambulatory Visit: Payer: Self-pay | Admitting: Hematology and Oncology

## 2023-01-16 ENCOUNTER — Encounter: Payer: Self-pay | Admitting: Cardiology

## 2023-01-17 NOTE — Telephone Encounter (Signed)
 Patient identification verified by 2 forms. Roberta Lucky, RN    Called and spoke to patient  Informed patient:   -best to contact insurance regarding possible deductible and other covered medications  Patient states:   -only her cholesterol medication increased in cost   -historically never had high cholesterol levels   -unsure if she should continue statin since she never had cholesterol issues  Per chart review patient on statin due to hyperlipemia, also has history of non-ST elevated MI 12/2019 Informed patient message sent to Dr. Audery Blazing regarding continued use, will likely need to continue given cardiac history  Patient has no further questions at this time

## 2023-01-17 NOTE — Telephone Encounter (Signed)
 We don't have any way to see what commercial plans charge.  Many plans now have deductibles that have to be met on a yearly basis - they cost less to purchase as an employee, but they have to pay more for the services they use.  (High deductible plans).  She may have something like this.  Only thing to do is call the pharmacy help desk number on her card.  (I know she said calling them was useless, not sure what they told her)

## 2023-01-21 ENCOUNTER — Encounter: Payer: Self-pay | Admitting: Internal Medicine

## 2023-01-21 NOTE — Patient Instructions (Addendum)
      Blood work was ordered.       Medications changes include :   None    A referral was ordered and someone will call you to schedule an appointment.     Return in about 6 months (around 07/22/2023) for Physical Exam.

## 2023-01-21 NOTE — Progress Notes (Unsigned)
Subjective:    Patient ID: Roberta [name], female    DOB: [birth year], 58 y.o.   MRN: [id number]     HPI Roberta [name] is here for follow up of her chronic medical problems.  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      Medications and allergies reviewed with patient and updated if appropriate.  Current Outpatient Medications on File Prior to Visit  Medication Sig Dispense Refill   anastrozole (ARIMIDEX) 1 MG tablet TAKE 1 TABLET BY MOUTH EVERY DAY 90 tablet 4   aspirin EC 81 MG EC tablet Take 1 tablet (81 mg total) by mouth daily. Swallow whole. 30 tablet 11   atorvastatin (LIPITOR) 80 MG tablet TAKE 1 TABLET BY MOUTH EVERY DAY 90 tablet 3   Ferrous Sulfate (IRON PO) Take 65 mg by mouth daily.     furosemide (LASIX) 20 MG tablet TAKE 1 TABLET BY MOUTH EVERY DAY 90 tablet 1   metoprolol tartrate (LOPRESSOR) 25 MG tablet TAKE 1/2 TABLET TWICE A DAY BY MOUTH 90 tablet 2   Multiple Vitamin (MULTIVITAMIN) tablet Take 1 tablet by mouth daily.     nitroGLYCERIN (NITROSTAT) 0.4 MG SL tablet Place 1 tablet (0.4 mg total) under the tongue every 5 (five) minutes x 3 doses as needed for chest pain. 25 tablet 5   CINNAMON PO Take 2 capsules by mouth daily. (Patient not taking: Reported on 01/22/2023)     No current facility-administered medications on file prior to visit.     Review of Systems  Constitutional:  Negative for fever.  Respiratory:  Negative for cough, shortness of breath and wheezing.   Cardiovascular:  Negative for chest pain, palpitations and leg swelling.  Neurological:  Negative for light-headedness and headaches.       Objective:   Vitals:   01/22/23 0953  BP: 126/84  Pulse: 60  Temp: 97.6 F (36.4 C)  SpO2: 98%   BP Readings from Last 3 Encounters:  01/22/23 126/84  11/28/22 128/84  11/07/22 (!) 146/92   Wt Readings from Last 3 Encounters:  01/22/23 282 lb (127.9 kg)  11/28/22 273 lb (123.8 kg)  11/07/22 272 lb  (123.4 kg)   Body mass index is 45.52 kg/m.    Physical Exam Constitutional:      General: She is not in acute distress.    Appearance: Normal appearance.  HENT:     Head: Normocephalic and atraumatic.  Eyes:     Conjunctiva/sclera: Conjunctivae normal.  Cardiovascular:     Rate and Rhythm: Normal rate and regular rhythm.     Heart sounds: Normal heart sounds.  Pulmonary:     Effort: Pulmonary effort is normal. No respiratory distress.     Breath sounds: Normal breath sounds. No wheezing.  Musculoskeletal:     Cervical back: Neck supple.     Right lower leg: No edema.     Left lower leg: No edema.  Lymphadenopathy:     Cervical: No cervical adenopathy.  Skin:    General: Skin is warm and dry.     Findings: No rash.  Neurological:     Mental Status: She is alert. Mental status is at baseline.  Psychiatric:        Mood and Affect: Mood normal.        Behavior: Behavior normal.        Lab Results  Component Value Date   WBC 5.0 11/07/2022   HGB  13.1 11/07/2022   HCT 40.5 11/07/2022   PLT 270 11/07/2022   GLUCOSE 99 11/07/2022   CHOL 112 06/21/2022   TRIG 50.0 06/21/2022   HDL 59.00 06/21/2022   LDLCALC 43 06/21/2022   ALT 20 11/07/2022   AST 24 11/07/2022   NA 138 11/07/2022   K 3.9 11/07/2022   CL 103 11/07/2022   CREATININE 1.01 (H) 11/07/2022   BUN 17 11/07/2022   CO2 31 11/07/2022   TSH 1.51 06/21/2022   HGBA1C 5.7 06/21/2022     Assessment & Plan:    See Problem List for Assessment and Plan of chronic medical problems.

## 2023-01-22 ENCOUNTER — Encounter: Payer: Self-pay | Admitting: Internal Medicine

## 2023-01-22 ENCOUNTER — Ambulatory Visit (INDEPENDENT_AMBULATORY_CARE_PROVIDER_SITE_OTHER): Payer: 59 | Admitting: Internal Medicine

## 2023-01-22 VITALS — BP 126/84 | HR 60 | Temp 97.6°F | Ht 66.0 in | Wt 282.0 lb

## 2023-01-22 DIAGNOSIS — R600 Localized edema: Secondary | ICD-10-CM | POA: Diagnosis not present

## 2023-01-22 DIAGNOSIS — R7303 Prediabetes: Secondary | ICD-10-CM

## 2023-01-22 DIAGNOSIS — Z9861 Coronary angioplasty status: Secondary | ICD-10-CM | POA: Diagnosis not present

## 2023-01-22 DIAGNOSIS — I2510 Atherosclerotic heart disease of native coronary artery without angina pectoris: Secondary | ICD-10-CM

## 2023-01-22 DIAGNOSIS — I10 Essential (primary) hypertension: Secondary | ICD-10-CM

## 2023-01-22 LAB — COMPREHENSIVE METABOLIC PANEL
ALT: 24 U/L (ref 0–35)
AST: 28 U/L (ref 0–37)
Albumin: 3.7 g/dL (ref 3.5–5.2)
Alkaline Phosphatase: 102 U/L (ref 39–117)
BUN: 21 mg/dL (ref 6–23)
CO2: 31 meq/L (ref 19–32)
Calcium: 9.1 mg/dL (ref 8.4–10.5)
Chloride: 105 meq/L (ref 96–112)
Creatinine, Ser: 1.03 mg/dL (ref 0.40–1.20)
GFR: 59.38 mL/min — ABNORMAL LOW (ref >60.00)
Glucose, Bld: 79 mg/dL (ref 70–99)
Potassium: 3.6 meq/L (ref 3.5–5.1)
Sodium: 142 meq/L (ref 135–145)
Total Bilirubin: 0.5 mg/dL (ref 0.2–1.2)
Total Protein: 7.5 g/dL (ref 6.0–8.3)

## 2023-01-22 LAB — HEMOGLOBIN A1C: Hgb A1c MFr Bld: 6.1 % (ref 4.6–6.5)

## 2023-01-22 LAB — LIPID PANEL
Cholesterol: 120 mg/dL (ref 0–200)
HDL: 61.5 mg/dL (ref >39.00)
LDL Cholesterol: 45 mg/dL (ref 0–99)
NonHDL: 58.33
Total CHOL/HDL Ratio: 2
Triglycerides: 65 mg/dL (ref 0.0–149.0)
VLDL: 13 mg/dL (ref 0.0–40.0)

## 2023-01-22 MED ORDER — ATORVASTATIN CALCIUM 80 MG PO TABS: 80.0000 mg | ORAL_TABLET | Freq: Every day | ORAL | 1 refills | Status: DC

## 2023-01-22 NOTE — Assessment & Plan Note (Signed)
Chronic Controlled Likely venous insufficiency Continue Lasix 20 mg daily Stressed low sodium diet, exercise and weight loss Advised compression socks as much as possible

## 2023-01-22 NOTE — Assessment & Plan Note (Signed)
Chronic S/p PTCA  On metoprolol 12.5 mg twice daily, atorvastatin 80 mg daily, ASA Following with cardiology Denies any symptoms consistent with angina Exercising, working on weight loss BP well controlled, sugars controlled

## 2023-01-22 NOTE — Assessment & Plan Note (Signed)
Chronic Lab Results  Component Value Date   HGBA1C 5.7 06/21/2022   Check A1c Continue diabetic diet, regular exercise Continue weight loss efforts

## 2023-01-22 NOTE — Assessment & Plan Note (Signed)
Chronic Blood pressure controlled CMP Stressed low sodium diet, exercise, weight loss, which she is currently doing Continue  metoprolol 12.5 mg twice daily, lasix 20 mg daily for edema

## 2023-01-25 ENCOUNTER — Encounter: Payer: Self-pay | Admitting: Internal Medicine

## 2023-03-14 ENCOUNTER — Encounter: Payer: Self-pay | Admitting: Internal Medicine

## 2023-03-21 ENCOUNTER — Emergency Department (HOSPITAL_COMMUNITY)

## 2023-03-21 ENCOUNTER — Emergency Department (HOSPITAL_COMMUNITY)
Admission: EM | Admit: 2023-03-21 | Discharge: 2023-03-22 | Disposition: A | Discharge status: Home / Self Care | Attending: Emergency Medicine | Admitting: Emergency Medicine

## 2023-03-21 ENCOUNTER — Encounter (HOSPITAL_COMMUNITY): Payer: Self-pay | Admitting: *Deleted

## 2023-03-21 ENCOUNTER — Other Ambulatory Visit: Payer: Self-pay

## 2023-03-21 DIAGNOSIS — I10 Essential (primary) hypertension: Secondary | ICD-10-CM | POA: Diagnosis not present

## 2023-03-21 DIAGNOSIS — Z853 Personal history of malignant neoplasm of breast: Secondary | ICD-10-CM | POA: Insufficient documentation

## 2023-03-21 DIAGNOSIS — Z7982 Long term (current) use of aspirin: Secondary | ICD-10-CM | POA: Insufficient documentation

## 2023-03-21 DIAGNOSIS — R0789 Other chest pain: Secondary | ICD-10-CM | POA: Insufficient documentation

## 2023-03-21 DIAGNOSIS — Z79899 Other long term (current) drug therapy: Secondary | ICD-10-CM | POA: Diagnosis not present

## 2023-03-21 DIAGNOSIS — R079 Chest pain, unspecified: Secondary | ICD-10-CM

## 2023-03-21 LAB — BASIC METABOLIC PANEL
Anion gap: 5 (ref 5–15)
BUN: 22 mg/dL — ABNORMAL HIGH (ref 6–20)
CO2: 29 mmol/L (ref 22–32)
Calcium: 8.7 mg/dL — ABNORMAL LOW (ref 8.9–10.3)
Chloride: 107 mmol/L (ref 98–111)
Creatinine, Ser: 0.96 mg/dL (ref 0.44–1.00)
GFR, Estimated: >60 mL/min (ref >60)
Glucose, Bld: 158 mg/dL — ABNORMAL HIGH (ref 70–99)
Potassium: 3.4 mmol/L — ABNORMAL LOW (ref 3.5–5.1)
Sodium: 141 mmol/L (ref 135–145)

## 2023-03-21 LAB — CBC
HCT: 42.1 % (ref 36.0–46.0)
Hemoglobin: 13.6 g/dL (ref 12.0–15.0)
MCH: 27.5 pg (ref 26.0–34.0)
MCHC: 32.3 g/dL (ref 30.0–36.0)
MCV: 85.2 fL (ref 80.0–100.0)
Platelets: 176 10*3/uL (ref 150–400)
RBC: 4.94 MIL/uL (ref 3.87–5.11)
RDW: 13.6 % (ref 11.5–15.5)
WBC: 5.4 10*3/uL (ref 4.0–10.5)
nRBC: 0 % (ref 0.0–0.2)

## 2023-03-21 LAB — TROPONIN I (HIGH SENSITIVITY): Troponin I (High Sensitivity): 5 ng/L (ref <18)

## 2023-03-21 MED ORDER — ASPIRIN 81 MG PO CHEW
324.0000 mg | CHEWABLE_TABLET | Freq: Once | ORAL | Status: AC
  Administered 2023-03-21: 324 mg via ORAL
  Filled 2023-03-21: qty 4

## 2023-03-21 NOTE — ED Triage Notes (Signed)
 The pt is c/o chest pain  she had an mi in 2021 she  has  not had dizziness   no blood thinner

## 2023-03-21 NOTE — ED Notes (Signed)
 Pt to xray

## 2023-03-21 NOTE — ED Provider Triage Note (Signed)
 Emergency Medicine Provider Triage Evaluation Note  Roberta [name] , a 58 y.o. female  was evaluated in triage.  Pt complains of chest pain. She reports that her current chest pain feels like something is sitting on her chest. Feels very similar to prior episode in which she had an MI in 2021. Denies use of blood thinners. No radiating pain into the neck, shoulders, nausea, vomiting, or diaphoresis.  Review of Systems  Positive: As above Negative: As above  Physical Exam  BP (!) 161/95 (BP Location: Right Arm)   Pulse 77   Temp 98.4 F (36.9 C)   Resp 16   Ht 5\' 6"  (1.676 m)   Wt 127.9 kg   LMP 09/04/2018 (Exact Date)   SpO2 99%   BMI 45.51 kg/m  Gen:   Awake, no distress, uncomfortable   Resp:  Normal effort  MSK:   Moves extremities without difficulty, no chest wall tenderness  Other:    Medical Decision Making  Medically screening exam initiated at 10:43 PM.  Appropriate orders placed.  Roberta [name] was informed that the remainder of the evaluation will be completed by another provider, this initial triage assessment does not replace that evaluation, and the importance of remaining in the ED until their evaluation is complete.  Given patient's history of prior MI and identical symptoms, ASA 324mg  administered in triage.   Smitty Knudsen, PA-C 03/21/23 2243

## 2023-03-22 LAB — TROPONIN I (HIGH SENSITIVITY): Troponin I (High Sensitivity): 6 ng/L (ref <18)

## 2023-03-22 NOTE — ED Provider Notes (Signed)
 [place] EMERGENCY DEPARTMENT AT [facility] Provider Note   CSN: [id number] Arrival date & time: 03/21/23  2141     History  Chief Complaint  Patient presents with   Chest Pain    Roberta [name] is a 58 y.o. female.  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  Patient states that she woke up from her sleep Wednesday morning at approximately 2 AM with some chest pressure.  She states she got up and do things around the house and was able to go back to sleep at around 5 AM.  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  She went to dinner with a friend this evening and continued to feel worse.  At that time she decided come to the emergency department for evaluation because she felt that her chest pressure was similar in nature to what she felt when she had the MI in 2021.  Patient describes a chest heaviness or pressure across her entire chest.  She denies nausea, vomiting, shortness of breath, radiation of symptoms.  She does endorse hypertension and takes her medications as prescribed.  She follows with cardiology.   Chest Pain      Home Medications Prior to Admission medications   Medication Sig Start Date End Date Taking? Authorizing Provider  anastrozole (ARIMIDEX) 1 MG tablet TAKE 1 TABLET BY MOUTH EVERY DAY 01/12/23   Rachel Moulds, MD  aspirin EC 81 MG EC tablet Take 1 tablet (81 mg total) by mouth daily. Swallow whole. 12/13/19   Kroeger, Ovidio Kin., PA-C  atorvastatin (LIPITOR) 80 MG tablet Take 1 tablet (80 mg total) by mouth daily. 01/22/23   Pincus Sanes, MD  Ferrous Sulfate (IRON PO) Take 65 mg by mouth daily.    [provider]  furosemide (LASIX) 20 MG tablet TAKE 1 TABLET BY MOUTH EVERY DAY 10/12/22   Pincus Sanes, MD  metoprolol tartrate (LOPRESSOR) 25 MG tablet TAKE 1/2 TABLET TWICE A DAY BY MOUTH 10/12/22   Lewayne Bunting, MD  Multiple Vitamin (MULTIVITAMIN) tablet Take 1 tablet by mouth daily.    [provider]  nitroGLYCERIN (NITROSTAT) 0.4 MG SL tablet Place 1 tablet (0.4 mg total) under the tongue every 5 (five) minutes x 3 doses as needed for chest pain. 12/24/20   Jodelle Gross, NP      Allergies    Lisinopril, Amlodipine, Penicillins, Tetanus toxoid, Imdur [isosorbide nitrate], Norvasc [amlodipine besylate], Red dye #40 (allura red), and Tetanus toxoids    Review of Systems   Review of Systems  Cardiovascular:  Positive for chest pain.    Physical Exam Updated Vital Signs BP (!) 151/93 (BP Location: Right Arm)   Pulse 70   Temp 98.4 F (36.9 C)   Resp 18   Ht 5\' 6"  (1.676 m)   Wt 127.9 kg   LMP 09/04/2018 (Exact Date)   SpO2 96%   BMI 45.51 kg/m  Physical Exam Vitals and nursing note reviewed.  Constitutional:      General: She is not in acute distress.    Appearance: She is well-developed.  HENT:     Head: Normocephalic and atraumatic.  Eyes:     Conjunctiva/sclera: Conjunctivae normal.  Cardiovascular:     Rate and Rhythm: Normal rate  and regular rhythm.  Pulmonary:     Effort: Pulmonary effort is normal. No respiratory distress.     Breath sounds: Normal breath sounds.  Chest:     Chest wall: No tenderness.  Abdominal:     Palpations: Abdomen is soft.     Tenderness: There is no abdominal tenderness.  Musculoskeletal:        General: No swelling.     Cervical back: Neck supple.     Right lower leg: No edema.     Left lower leg: No edema.  Skin:    General: Skin is warm and dry.     Capillary Refill: Capillary refill takes less than 2 seconds.  Neurological:     Mental Status: She is alert.  Psychiatric:        Mood and Affect: Mood normal.     ED Results / Procedures / Treatments   Labs (all labs ordered are listed, but only  abnormal results are displayed) Labs Reviewed  BASIC METABOLIC PANEL - Abnormal; Notable for the following components:      Result Value   Potassium 3.4 (*)    Glucose, Bld 158 (*)    BUN 22 (*)    Calcium 8.7 (*)    All other components within normal limits  CBC  TROPONIN I (HIGH SENSITIVITY)  TROPONIN I (HIGH SENSITIVITY)    EKG EKG Interpretation Date/Time:  Wednesday March 21 2023 22:16:31 EDT Ventricular Rate:  75 PR Interval:  180 QRS Duration:  88 QT Interval:  362 QTC Calculation: 404 R Axis:   9  Text Interpretation: Normal sinus rhythm Minimal voltage criteria for LVH, may be normal variant ( R in aVL ) Nonspecific T wave abnormality Abnormal ECG Confirmed by Tilden Fossa [id number]) on 03/22/2023 1:25:37 AM  Radiology DG Chest 2 View Result Date: 03/21/2023 CLINICAL DATA:  Chest pain EXAM: CHEST - 2 VIEW COMPARISON:  02/03/2022 FINDINGS: The heart size and mediastinal contours are within normal limits. Both lungs are clear. The visualized skeletal structures are unremarkable. IMPRESSION: No active cardiopulmonary disease. Electronically Signed   By: Charlett Nose M.D.   On: 03/21/2023 22:29    Procedures Procedures    Medications Ordered in ED Medications  aspirin chewable tablet 324 mg (324 mg Oral Given 03/21/23 2246)    ED Course/ Medical Decision Making/ A&P                                 Medical Decision Making  This patient presents to the ED for concern of chest pressure, this involves an extensive number of treatment options, and is a complaint that carries with it a high risk of complications and morbidity.  The differential diagnosis includes ACS, pneumonia, PE, anxiety, musculoskeletal pain, others   Co morbidities that complicate the patient evaluation  Previous MI, hypertension   Additional history obtained:   External records from outside source obtained and reviewed including internal medicine notes   Lab Tests:  I Ordered, and  personally interpreted labs.  The pertinent results include: Initial troponin 5, repeat 6, grossly unremarkable CBC, BMP   Imaging Studies ordered:  I ordered imaging studies including chest x-ray I independently visualized and interpreted imaging which showed no active disease I agree with the radiologist interpretation   Cardiac Monitoring: / EKG:  The patient was maintained on a cardiac monitor.  I personally viewed and interpreted the cardiac monitored which showed an underlying  rhythm of: Normal sinus rhythm   Problem List / ED Course / Critical interventions / Medication management   I ordered medication including aspirin for chest pain  Reevaluation of the patient after these medicines showed that the patient improved I have reviewed the patients home medicines and have made adjustments as needed   Test / Admission - Considered:  Patient with negative troponins x 2, nonischemic EKG.  Does not appear to be ACS.  No shortness of breath to suggest pneumonia or pulmonary embolism.  Normal chest x-ray.  Patient is not tachycardic.  No radiation of symptoms.  No nausea or vomiting.  At this time patient does appear to be stable for discharge home.  Patient has a cardiologist and will recommend follow-up as an outpatient with her cardiologist for further evaluation.  Strict return precautions provided.          Final Clinical Impression(s) / ED Diagnoses Final diagnoses:  Chest pain, unspecified type    Rx / DC Orders ED Discharge Orders     None         Pamala Duffel 03/22/23 0309    Tilden Fossa, MD 03/22/23 [phone]

## 2023-03-22 NOTE — Discharge Instructions (Signed)
 Your workup this evening was reassuring with troponin values which were in the normal range and a EKG not showing signs of a heart attack at this time.  Please call your cardiologist in the morning to schedule a follow-up appointment.  If you develop worsening symptoms, shortness of breath, or other life-threatening symptoms please return to the emergency department.

## 2023-04-11 ENCOUNTER — Other Ambulatory Visit: Payer: Self-pay | Admitting: Internal Medicine

## 2023-04-19 ENCOUNTER — Ambulatory Visit: Attending: Emergency Medicine | Admitting: Emergency Medicine

## 2023-04-19 ENCOUNTER — Encounter: Payer: Self-pay | Admitting: Emergency Medicine

## 2023-04-19 VITALS — BP 128/88 | HR 71 | Ht 66.0 in | Wt 281.8 lb

## 2023-04-19 DIAGNOSIS — I10 Essential (primary) hypertension: Secondary | ICD-10-CM

## 2023-04-19 DIAGNOSIS — E785 Hyperlipidemia, unspecified: Secondary | ICD-10-CM

## 2023-04-19 DIAGNOSIS — I2510 Atherosclerotic heart disease of native coronary artery without angina pectoris: Secondary | ICD-10-CM

## 2023-04-19 DIAGNOSIS — R079 Chest pain, unspecified: Secondary | ICD-10-CM | POA: Diagnosis not present

## 2023-04-19 MED ORDER — METOPROLOL TARTRATE 100 MG PO TABS: 100.0000 mg | ORAL_TABLET | Freq: Two times a day (BID) | ORAL | 3 refills | Status: DC

## 2023-04-19 MED ORDER — METOPROLOL TARTRATE 100 MG PO TABS: 100.0000 mg | ORAL_TABLET | Freq: Two times a day (BID) | ORAL | 0 refills | Status: DC

## 2023-04-19 NOTE — Patient Instructions (Signed)
 Medication Instructions:  TAKE METOPROLOL TARTRATE 100 MG (1 TABLET) 2 HOURS BEFORE YOUR CT SCAN.   Lab Work: Nutritional therapist AND CBC BEFORE CT SCAN  Testing/Procedures:   Your cardiac CT will be scheduled at one of the below locations:   [facility] [address] [place], [place] [address] [phone]   If scheduled at [facility], please arrive at the [facility] (Entrance C2) of [facility] 30 minutes prior to test start time. You can use the FREE valet parking offered at entrance C (encouraged to control the heart rate for the test)  Proceed to the [facility] Radiology Department (first floor) to check-in and test prep.  All radiology patients and guests should use entrance C2 at [facility], accessed from [facility], even though the hospital's physical address listed is [address]     If scheduled at [facility], please arrive 30 minutes early for check-in and test prep.  Please follow these instructions carefully (unless otherwise directed):   On the Night Before the Test: Be sure to Drink plenty of water. Do not consume any caffeinated/decaffeinated beverages or chocolate 12 hours prior to your test. Do not take any antihistamines 12 hours prior to your test. If the patient has contrast allergy: Patient will need a prescription for Prednisone and very clear instructions (as follows): Prednisone 50 mg - take 13 hours prior to test Take another Prednisone 50 mg 7 hours prior to test Take another Prednisone 50 mg 1 hour prior to test Take Benadryl 50 mg 1 hour prior to test Patient must complete all four doses of above prophylactic medications. Patient will need a ride after test due to Benadryl.  On the Day of the Test: Drink plenty of water until 1 hour prior to the test. Do not eat any food 1 hour prior to test. You may take your regular medications prior to the test.  Take  metoprolol (Lopressor) two hours prior to test. If you take Furosemide/Hydrochlorothiazide/Spironolactone/Chlorthalidone, please HOLD on the morning of the test. Patients who wear a continuous glucose monitor MUST remove the device prior to scanning. FEMALES- please wear underwire-free bra if available, avoid dresses & tight clothing      After the Test: Drink plenty of water. After receiving IV contrast, you may experience a mild flushed feeling. This is normal. On occasion, you may experience a mild rash up to 24 hours after the test. This is not dangerous. If this occurs, you can take Benadryl 25 mg, Zyrtec, Claritin, or Allegra and increase your fluid intake. (Patients taking Tikosyn should avoid Benadryl, and may take Zyrtec, Claritin, or Allegra) If you experience trouble breathing, this can be serious. If it is severe call [phone] IMMEDIATELY. If it is mild, please call our office.  We will call to schedule your test 2-4 weeks out understanding that some insurance companies will need an authorization prior to the service being performed.   For more information and frequently asked questions, please visit our website : [email or web address]  For non-scheduling related questions, please contact the cardiac imaging nurse navigator should you have any questions/concerns: Cardiac Imaging Nurse Navigators Direct Office Dial: [phone]   For scheduling needs, including cancellations and rescheduling, please call [place], [phone].   Follow-Up: At [facility], you and your health needs are our priority.  As part of our continuing mission to provide you with exceptional heart care, our providers are all part of one team.  This team includes your primary Cardiologist (physician) and Advanced Practice Providers or APPs (Physician Assistants and Nurse Practitioners) who all work together to provide you with the care you need, when you need it.  Your next appointment:    3 month(s)  Provider:   MADISON FOUNTAIN, DNP   Other Instructions:    1st Floor: - Lobby - Registration  - Pharmacy  - Lab - Cafe  2nd Floor: - PV Lab - Diagnostic Testing (echo, CT, nuclear med)  3rd Floor: - Vacant  4th Floor: - TCTS (cardiothoracic surgery) - AFib Clinic - Structural Heart Clinic - Vascular Surgery  - Vascular Ultrasound  5th Floor: - HeartCare Cardiology (general and EP) - Clinical Pharmacy for coumadin, hypertension, lipid, weight-loss medications, and med management appointments    Valet parking services will be available as well.

## 2023-04-19 NOTE — Progress Notes (Signed)
 Cardiology Office Note:    Date:  04/19/2023  ID:  Roberta [name], DOB Jun 17, 1965, MRN [id number] PCP: Colene Dauphin, MD  [facility] Providers Cardiologist:  Alexandria Angel, MD       Patient Profile:      Chief Complaint: Acute visit for chest pains History of Present Illness:  Roberta [name] is a 58 y.o. female with visit-pertinent history of coronary artery disease, hypertension, hyperlipidemia  Patient admitted in December 2021 with NSTEMI.  Cardiac catheterization revealed thrombotic occlusion of small ramus intermedius which was felt to be the culprit and 50% stenosis of small PDA.  Patient had PTCA of proximal ramus intermedius with resectable angiographic result and restoration of TIMI-3 flow, stent was not placed as the vessel was too small.  Echocardiogram at that time showed LVEF 65-70%, grade 1 DD, no valvular abnormalities.  Last seen in clinic on 08/17/2022 by Dr. Audery Blazing.  Patient was doing well without recurrent chest pain.  No medication changes were made and she was to follow-up in 1 year.  Patient was recently seen in the ED on 03/21/2023 with complaints of chest pain.  She felt that her chest pain was similar in nature to what she felt when she had her NSTEMI in 2021.  Patient's EKG was nonischemic.  High-sensitivity troponins were negative x 2.  Her chest x-ray was normal.  She was discharged home in stable condition to follow-up with cardiology.   Discussed the use of AI scribe software for clinical note transcription with the patient, who gave verbal consent to proceed.  History of Present Illness The patient presents today with a recent episode of chest pain that occurred 1 month ago. The pain was described as a dull pressure, similar to the sensation she experienced during her NSTEMI in 2021. The pain was not exertional and lasted the entire day, coming and going. The patient reported that the pain started while she was getting ready for the day and  did not worsen with any specific activity.  She denied any radiation of her pain as well as any alleviating or aggravating factors.  She had noted the pain had improved that night while in the ER.  She denies any reoccurrence of chest pain since this event.  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  She denies shortness of breath, lower extremity edema, fatigue, palpitations, melena, hematuria, hemoptysis, diaphoresis, weakness, presyncope, syncope, orthopnea, and PND.  Review of systems:  Please see the history of present illness. All other systems are reviewed and otherwise negative.     Home Medications:    Current Meds  Medication Sig   anastrozole (ARIMIDEX) 1 MG tablet TAKE 1 TABLET BY MOUTH EVERY DAY   aspirin EC 81 MG EC tablet Take 1 tablet (81 mg total) by mouth daily. Swallow whole.   atorvastatin (LIPITOR) 80 MG tablet Take 1 tablet (80 mg total) by mouth daily.   Ferrous Sulfate (IRON PO) Take 65 mg by mouth daily.   furosemide (LASIX) 20 MG tablet TAKE 1 TABLET BY MOUTH EVERY DAY   metoprolol tartrate (LOPRESSOR) 25 MG tablet TAKE 1/2 TABLET TWICE A DAY BY MOUTH   Multiple Vitamin (MULTIVITAMIN) tablet Take 1 tablet by mouth daily.   nitroGLYCERIN (NITROSTAT) 0.4 MG SL tablet Place 1 tablet (0.4 mg total) under the tongue every 5 (five) minutes x 3 doses as needed for chest pain.   [DISCONTINUED] metoprolol  tartrate (LOPRESSOR) 100 MG tablet Take 1 tablet (100 mg total) by mouth 2 (two) times daily.   Studies Reviewed:       Echocardiogram 12/12/2019 1. Left ventricular ejection fraction, by estimation, is 65 to 70%. The  left ventricle has normal function. The left ventricle has no regional  wall motion abnormalities. Left ventricular diastolic parameters are  consistent with Grade I diastolic  dysfunction (impaired relaxation).   2. Right ventricular systolic function is normal. The right  ventricular  size is normal.   3. The mitral valve is normal in structure. No evidence of mitral valve  regurgitation. No evidence of mitral stenosis.   4. The aortic valve was not well visualized. Aortic valve regurgitation  is not visualized. No aortic stenosis is present.   5. The inferior vena cava is normal in size with greater than 50%  respiratory variability, suggesting right atrial pressure of 3 mmHg.   Cardiac catheterization 12/11/2019 Two-vessel coronary artery disease with thrombotic occlusion of small ramus intermedius (culprit lesion) and 50% stenosis of small rPDA. Mildly reduced left ventricular systolic function with mid anterolateral hypokinesis.  Left ventricular filling pressure is normal. Successful PTCA of proximal ramus intermedius with acceptable angiographic result and restoration of TIMI-3 flow.  Stent placement was not performed due to small vessel size and tortuosity. Diagnostic Dominance: Co-dominant  Intervention   Risk Assessment/Calculations:             Physical Exam:   VS:  BP 128/88 (BP Location: Left Arm, Patient Position: Sitting, Cuff Size: Normal)   Pulse 71   Ht 5\' 6"  (1.676 m)   Wt 281 lb 12.8 oz (127.8 kg)   LMP 09/04/2018 (Exact Date)   SpO2 98%   BMI 45.48 kg/m    Wt Readings from Last 3 Encounters:  04/19/23 281 lb 12.8 oz (127.8 kg)  03/21/23 281 lb 15.5 oz (127.9 kg)  01/22/23 282 lb (127.9 kg)    GEN: Well nourished, well developed in no acute distress NECK: No JVD; No carotid bruits CARDIAC: RRR, no murmurs, rubs, gallops RESPIRATORY:  Clear to auscultation without rales, wheezing or rhonchi  ABDOMEN: Soft, non-tender, non-distended EXTREMITIES:  No edema; No acute deformity     Assessment and Plan:  Coronary artery disease NSTEMI 12/2019 with successful PTCA of proximal ramus intermedius with acceptable angiographic result and restoration of TIMI-3 flow.  Stent placement was not performed due to small vessel size and  tortuosity. Echo 12/2019 with LVEF 65-70% - Patient notes 1 episode of intermittent nonexertional chest pains that occurred approximately 1 month ago.  Chest pain described as dull and lasted most of the day.  No radiation, no alleviating/aggravating factors.  Resolved shortly after 325 ASA during ED visit.  Noted pain was similar to presenting symptoms of NSTEMI - She has had no recurrent chest pain since this event 1 month ago - EKG was reviewed from recent ED visit showing no acute ischemic changes - Had long discussion with patient over ischemic evaluation.  We agree for further testing, will order Coronary CTA for further ischemic evaluation given history of thrombotic occlusion to ramus intermedius and 50% stenosis of small rPDA - Continue aspirin 81 mg daily, atorvastatin 80 mg daily, nitroglycerin as needed - ED precautions given - BMET and CBC today  Hypertension Blood pressure today 128/88 Diastolic BP elevated today.  It seems to have been >80 with several previous office visits with cardiology - Plan to measure blood pressure at home and bring in  BP log to next office visit - Consider on follow-up visit if diastolic BP remains elevated, addition of ARB to better control BP - Continue metoprolol tartrate 12.5 mg twice daily and Lasix 20 mg daily  Hyperlipidemia LDL 45, HDL 61, TG 65 on 01/2023 LDL under excellent control and under goal of less than 70 - Continue atorvastatin 80 mg daily     Dispo:  Return in about 3 months (around 07/19/2023).  Signed, Ava Boatman, NP

## 2023-04-20 ENCOUNTER — Other Ambulatory Visit: Payer: Self-pay | Admitting: *Deleted

## 2023-04-20 NOTE — Addendum Note (Signed)
 Addended by: Carmelita Ching on: 04/20/2023 08:16 AM   Modules accepted: Orders

## 2023-06-12 ENCOUNTER — Encounter: Payer: Self-pay | Admitting: Hematology and Oncology

## 2023-06-15 ENCOUNTER — Other Ambulatory Visit: Payer: Self-pay | Admitting: *Deleted

## 2023-06-15 DIAGNOSIS — C50212 Malignant neoplasm of upper-inner quadrant of left female breast: Secondary | ICD-10-CM

## 2023-06-18 ENCOUNTER — Other Ambulatory Visit (HOSPITAL_COMMUNITY)

## 2023-06-19 ENCOUNTER — Telehealth (HOSPITAL_COMMUNITY): Payer: Self-pay | Admitting: Emergency Medicine

## 2023-06-19 NOTE — Telephone Encounter (Signed)
 Reaching out to patient to offer assistance regarding upcoming cardiac imaging study; pt verbalizes understanding of appt date/time, parking situation and where to check in, pre-test NPO status and medications ordered, and verified current allergies; name and call back number provided for further questions should they arise Rockwell Alexandria RN Navigator Cardiac Imaging [facility] [phone] office [phone] cell

## 2023-06-20 ENCOUNTER — Ambulatory Visit (HOSPITAL_COMMUNITY)
Admission: RE | Admit: 2023-06-20 | Discharge: 2023-06-20 | Disposition: A | Discharge status: Home / Self Care | Source: Ambulatory Visit | Attending: Emergency Medicine | Admitting: Emergency Medicine

## 2023-06-20 ENCOUNTER — Ambulatory Visit: Payer: Self-pay | Admitting: Emergency Medicine

## 2023-06-20 DIAGNOSIS — I2510 Atherosclerotic heart disease of native coronary artery without angina pectoris: Secondary | ICD-10-CM | POA: Diagnosis not present

## 2023-06-20 DIAGNOSIS — R079 Chest pain, unspecified: Secondary | ICD-10-CM | POA: Insufficient documentation

## 2023-06-20 MED ORDER — NITROGLYCERIN 0.4 MG SL SUBL
0.8000 mg | SUBLINGUAL_TABLET | Freq: Once | SUBLINGUAL | Status: AC
  Administered 2023-06-20: 0.8 mg via SUBLINGUAL

## 2023-06-20 MED ORDER — IOHEXOL 350 MG/ML SOLN
100.0000 mL | Freq: Once | INTRAVENOUS | Status: AC | PRN
  Administered 2023-06-20: 100 mL via INTRAVENOUS

## 2023-06-20 MED ORDER — IOHEXOL 350 MG/ML SOLN: 100.0000 mL | Freq: Once | INTRAVENOUS | Status: DC | PRN

## 2023-06-21 LAB — CBC
Hematocrit: 44.3 % (ref 34.0–46.6)
Hemoglobin: 14.6 g/dL (ref 11.1–15.9)
MCH: 28.5 pg (ref 26.6–33.0)
MCHC: 33 g/dL (ref 31.5–35.7)
MCV: 86 fL (ref 79–97)
Platelets: 219 10*3/uL (ref 150–450)
RBC: 5.13 x10E6/uL (ref 3.77–5.28)
RDW: 12.9 % (ref 11.7–15.4)
WBC: 3.9 10*3/uL (ref 3.4–10.8)

## 2023-06-21 LAB — BASIC METABOLIC PANEL WITH GFR
BUN/Creatinine Ratio: 22 (ref 12–28)
BUN: 22 mg/dL (ref 8–27)
CO2: 22 mmol/L (ref 20–29)
Calcium: 9 mg/dL (ref 8.7–10.3)
Chloride: 105 mmol/L (ref 96–106)
Creatinine, Ser: 0.98 mg/dL (ref 0.57–1.00)
Glucose: 85 mg/dL (ref 70–99)
Potassium: 4.4 mmol/L (ref 3.5–5.2)
Sodium: 142 mmol/L (ref 134–144)
eGFR: 66 mL/min/{1.73_m2} (ref >59)

## 2023-06-25 ENCOUNTER — Encounter: Payer: Self-pay | Admitting: Internal Medicine

## 2023-06-25 LAB — HM DIABETES EYE EXAM

## 2023-06-28 ENCOUNTER — Telehealth (HOSPITAL_BASED_OUTPATIENT_CLINIC_OR_DEPARTMENT_OTHER): Payer: Self-pay

## 2023-07-01 NOTE — Progress Notes (Unsigned)
 Subjective:    Patient ID: Roberta [name], female    DOB: [birth year], 58 y.o.   MRN: [id number]      HPI Roberta [name] is here for a Physical exam and her chronic medical problems.   Doing well.     Medications and allergies reviewed with patient and updated if appropriate.  Current Outpatient Medications on File Prior to Visit  Medication Sig Dispense Refill   anastrozole  (ARIMIDEX ) 1 MG tablet TAKE 1 TABLET BY MOUTH EVERY DAY 90 tablet 4   aspirin  EC 81 MG EC tablet Take 1 tablet (81 mg total) by mouth daily. Swallow whole. 30 tablet 11   atorvastatin  (LIPITOR ) 80 MG tablet Take 1 tablet (80 mg total) by mouth daily. 90 tablet 1   Ferrous Sulfate (IRON  PO) Take 65 mg by mouth daily.     furosemide  (LASIX ) 20 MG tablet TAKE 1 TABLET BY MOUTH EVERY DAY 90 tablet 1   metoprolol  tartrate (LOPRESSOR ) 100 MG tablet Take 1 tablet (100 mg total) by mouth 2 (two) times daily. TAKE 1 TABLET 2 HOURS BEFORE YOUR CT SCAN. (Patient taking differently: TAKE 1 TABLET 2 HOURS BEFORE YOUR CT SCAN.) 1 tablet 0   metoprolol  tartrate (LOPRESSOR ) 25 MG tablet TAKE 1/2 TABLET TWICE A DAY BY MOUTH 90 tablet 2   Multiple Vitamin (MULTIVITAMIN) tablet Take 1 tablet by mouth daily.     nitroGLYCERIN  (NITROSTAT ) 0.4 MG SL tablet Place 1 tablet (0.4 mg total) under the tongue every 5 (five) minutes x 3 doses as needed for chest pain. 25 tablet 5   No current facility-administered medications on file prior to visit.    Review of Systems  Constitutional:  Negative for fever.  Eyes:  Negative for visual disturbance.  Respiratory:  Negative for cough, shortness of breath and wheezing.   Cardiovascular:  Negative for chest pain, palpitations and leg swelling.  Gastrointestinal:  Negative for abdominal pain, blood in stool, constipation and diarrhea.       No gerd  Genitourinary:  Negative for dysuria.  Musculoskeletal:  Positive for arthralgias (knees) and back pain (stiff in morning).  Skin:  Negative for  rash.  Neurological:  Negative for light-headedness and headaches.  Psychiatric/Behavioral:  Negative for dysphoric mood. The patient is not nervous/anxious.        Objective:   Vitals:   07/03/23 1413  BP: 128/70  Pulse: (!) 59  Temp: 97.9 F (36.6 C)  SpO2: 99%   Filed Weights   07/03/23 1413  Weight: 290 lb (131.5 kg)   Body mass index is 46.81 kg/m.  BP Readings from Last 3 Encounters:  07/03/23 128/70  06/20/23 (!) 149/96  04/19/23 128/88    Wt Readings from Last 3 Encounters:  07/03/23 290 lb (131.5 kg)  04/19/23 281 lb 12.8 oz (127.8 kg)  03/21/23 281 lb 15.5 oz (127.9 kg)       Physical Exam Constitutional: She appears well-developed and well-nourished. No distress.  HENT:  Head: Normocephalic and atraumatic.  Right Ear: External ear normal. Normal ear canal and TM Left Ear: External ear normal.  Normal ear canal and TM Mouth/Throat: Oropharynx is clear and moist.  Eyes: Conjunctivae normal.  Neck: Neck supple. No tracheal deviation present. No thyromegaly present.  No carotid bruit  Cardiovascular: Normal rate, regular rhythm and normal heart sounds.   No murmur heard.  No edema. Pulmonary/Chest: Effort normal and breath sounds normal. No respiratory distress. She has no wheezes. She has no rales.  Breast:  deferred   Abdominal: Soft. She exhibits no distension. There is no tenderness.  Lymphadenopathy: She has no cervical adenopathy.  Skin: Skin is warm and dry. She is not diaphoretic.  Psychiatric: She has a normal mood and affect. Her behavior is normal.     Lab Results  Component Value Date   WBC 3.9 06/20/2023   HGB 14.6 06/20/2023   HCT 44.3 06/20/2023   PLT 219 06/20/2023   GLUCOSE 85 06/20/2023   CHOL 120 01/22/2023   TRIG 65.0 01/22/2023   HDL 61.50 01/22/2023   LDLCALC 45 01/22/2023   ALT 24 01/22/2023   AST 28 01/22/2023   NA 142 06/20/2023   K 4.4 06/20/2023   CL 105 06/20/2023   CREATININE 0.98 06/20/2023   BUN 22  06/20/2023   CO2 22 06/20/2023   TSH 1.51 06/21/2022   HGBA1C 6.1 01/22/2023         Assessment & Plan:   Physical exam: Screening blood work  ordered Exercise  going to gym 3/week x 45 min Weight  obese - working on weight loss Substance abuse  none   Reviewed recommended immunizations.   Health Maintenance  Topic Date Due   Pneumococcal Vaccine 35-66 Years old (1 of 2 - PCV) Never done   Zoster Vaccines- Shingrix (1 of 2) Never done   Cervical Cancer Screening (HPV/Pap Cotest)  03/28/2018   COVID-19 Vaccine (5 - 2024-25 season) 09/03/2022   INFLUENZA VACCINE  08/03/2023   MAMMOGRAM  07/30/2024   Colonoscopy  08/15/2028   Hepatitis C Screening  Completed   HIV Screening  Completed   Hepatitis B Vaccines  Aged Out   HPV VACCINES  Aged Out   Meningococcal B Vaccine  Aged Out   DTaP/Tdap/Td  Discontinued          See Problem List for Assessment and Plan of chronic medical problems.

## 2023-07-03 ENCOUNTER — Encounter: Payer: Self-pay | Admitting: Internal Medicine

## 2023-07-03 ENCOUNTER — Ambulatory Visit (INDEPENDENT_AMBULATORY_CARE_PROVIDER_SITE_OTHER): Admitting: Internal Medicine

## 2023-07-03 VITALS — BP 128/70 | HR 59 | Temp 97.9°F | Ht 66.0 in | Wt 290.0 lb

## 2023-07-03 DIAGNOSIS — E66813 Obesity, class 3: Secondary | ICD-10-CM | POA: Diagnosis not present

## 2023-07-03 DIAGNOSIS — R600 Localized edema: Secondary | ICD-10-CM

## 2023-07-03 DIAGNOSIS — R7303 Prediabetes: Secondary | ICD-10-CM

## 2023-07-03 DIAGNOSIS — E559 Vitamin D deficiency, unspecified: Secondary | ICD-10-CM | POA: Diagnosis not present

## 2023-07-03 DIAGNOSIS — I2510 Atherosclerotic heart disease of native coronary artery without angina pectoris: Secondary | ICD-10-CM | POA: Diagnosis not present

## 2023-07-03 DIAGNOSIS — Z0000 Encounter for general adult medical examination without abnormal findings: Secondary | ICD-10-CM

## 2023-07-03 DIAGNOSIS — Z6841 Body Mass Index (BMI) 40.0 and over, adult: Secondary | ICD-10-CM

## 2023-07-03 DIAGNOSIS — I10 Essential (primary) hypertension: Secondary | ICD-10-CM

## 2023-07-03 DIAGNOSIS — Z9861 Coronary angioplasty status: Secondary | ICD-10-CM

## 2023-07-03 LAB — COMPREHENSIVE METABOLIC PANEL WITH GFR
ALT: 31 U/L (ref 0–35)
AST: 32 U/L (ref 0–37)
Albumin: 3.8 g/dL (ref 3.5–5.2)
Alkaline Phosphatase: 105 U/L (ref 39–117)
BUN: 22 mg/dL (ref 6–23)
CO2: 29 meq/L (ref 19–32)
Calcium: 8.9 mg/dL (ref 8.4–10.5)
Chloride: 105 meq/L (ref 96–112)
Creatinine, Ser: 1.1 mg/dL (ref 0.40–1.20)
GFR: 54.7 mL/min — ABNORMAL LOW (ref >60.00)
Glucose, Bld: 87 mg/dL (ref 70–99)
Potassium: 3.9 meq/L (ref 3.5–5.1)
Sodium: 140 meq/L (ref 135–145)
Total Bilirubin: 0.5 mg/dL (ref 0.2–1.2)
Total Protein: 7.6 g/dL (ref 6.0–8.3)

## 2023-07-03 LAB — HEMOGLOBIN A1C: Hgb A1c MFr Bld: 6.1 % (ref 4.6–6.5)

## 2023-07-03 LAB — LIPID PANEL
Cholesterol: 115 mg/dL (ref 0–200)
HDL: 61.8 mg/dL (ref >39.00)
LDL Cholesterol: 37 mg/dL (ref 0–99)
NonHDL: 53.55
Total CHOL/HDL Ratio: 2
Triglycerides: 84 mg/dL (ref 0.0–149.0)
VLDL: 16.8 mg/dL (ref 0.0–40.0)

## 2023-07-03 LAB — CBC WITH DIFFERENTIAL/PLATELET
Basophils Absolute: 0 10*3/uL (ref 0.0–0.1)
Basophils Relative: 0.7 % (ref 0.0–3.0)
Eosinophils Absolute: 0.1 10*3/uL (ref 0.0–0.7)
Eosinophils Relative: 2.6 % (ref 0.0–5.0)
HCT: 43.7 % (ref 36.0–46.0)
Hemoglobin: 14.4 g/dL (ref 12.0–15.0)
Lymphocytes Relative: 36.7 % (ref 12.0–46.0)
Lymphs Abs: 2 10*3/uL (ref 0.7–4.0)
MCHC: 32.9 g/dL (ref 30.0–36.0)
MCV: 84 fl (ref 78.0–100.0)
Monocytes Absolute: 0.5 10*3/uL (ref 0.1–1.0)
Monocytes Relative: 9.3 % (ref 3.0–12.0)
Neutro Abs: 2.8 10*3/uL (ref 1.4–7.7)
Neutrophils Relative %: 50.7 % (ref 43.0–77.0)
Platelets: 185 10*3/uL (ref 150.0–400.0)
RBC: 5.2 Mil/uL — ABNORMAL HIGH (ref 3.87–5.11)
RDW: 13.3 % (ref 11.5–15.5)
WBC: 5.6 10*3/uL (ref 4.0–10.5)

## 2023-07-03 LAB — VITAMIN D 25 HYDROXY (VIT D DEFICIENCY, FRACTURES): VITD: 41.42 ng/mL (ref 30.00–100.00)

## 2023-07-03 LAB — TSH: TSH: 1.56 u[IU]/mL (ref 0.35–5.50)

## 2023-07-03 NOTE — Assessment & Plan Note (Signed)
Chronic Controlled Likely venous insufficiency Continue Lasix 20 mg daily Stressed low sodium diet, exercise and weight loss Advised compression socks as much as possible

## 2023-07-03 NOTE — Assessment & Plan Note (Signed)
 Chronic Actively working on weight loss Stressed regular exercise Stressed decrease portions, high in protein, fiber and vegetables Concentrate eating healthy but keeping calorie intake low

## 2023-07-03 NOTE — Assessment & Plan Note (Signed)
 Chronic S/p PTCA  On metoprolol 12.5 mg twice daily, atorvastatin 80 mg daily, ASA Following with cardiology Denies any symptoms consistent with angina Exercising, working on weight loss BP well controlled, sugars controlled

## 2023-07-03 NOTE — Assessment & Plan Note (Signed)
 Chronic Blood pressure controlled CMP, cbc Stressed low sodium diet, exercise, weight loss, which she is currently doing Continue  metoprolol  12.5 mg twice daily, lasix  20 mg daily for edema

## 2023-07-03 NOTE — Assessment & Plan Note (Signed)
 Chronic Lab Results  Component Value Date   HGBA1C 6.1 07/03/2023   Check A1c Continue diabetic diet, regular exercise Continue weight loss efforts

## 2023-07-03 NOTE — Patient Instructions (Addendum)
 Blood work was ordered.       Medications changes include :   None      Return in about 6 months (around 01/03/2024) for follow up.   Health Maintenance, Female Adopting a healthy lifestyle and getting preventive care are important in promoting health and wellness. Ask your health care provider about: The right schedule for you to have regular tests and exams. Things you can do on your own to prevent diseases and keep yourself healthy. What should I know about diet, weight, and exercise? Eat a healthy diet  Eat a diet that includes plenty of vegetables, fruits, low-fat dairy products, and lean protein. Do not eat a lot of foods that are high in solid fats, added sugars, or sodium. Maintain a healthy weight Body mass index (BMI) is used to identify weight problems. It estimates body fat based on height and weight. Your health care provider can help determine your BMI and help you achieve or maintain a healthy weight. Get regular exercise Get regular exercise. This is one of the most important things you can do for your health. Most adults should: Exercise for at least 150 minutes each week. The exercise should increase your heart rate and make you sweat (moderate-intensity exercise). Do strengthening exercises at least twice a week. This is in addition to the moderate-intensity exercise. Spend less time sitting. Even light physical activity can be beneficial. Watch cholesterol and blood lipids Have your blood tested for lipids and cholesterol at 60 years of age, then have this test every 5 years. Have your cholesterol levels checked more often if: Your lipid or cholesterol levels are high. You are older than 60 years of age. You are at high risk for heart disease. What should I know about cancer screening? Depending on your health history and family history, you may need to have cancer screening at various ages. This may include screening for: Breast cancer. Cervical  cancer. Colorectal cancer. Skin cancer. Lung cancer. What should I know about heart disease, diabetes, and high blood pressure? Blood pressure and heart disease High blood pressure causes heart disease and increases the risk of stroke. This is more likely to develop in people who have high blood pressure readings or are overweight. Have your blood pressure checked: Every 3-5 years if you are 70-27 years of age. Every year if you are 67 years old or older. Diabetes Have regular diabetes screenings. This checks your fasting blood sugar level. Have the screening done: Once every three years after age 44 if you are at a normal weight and have a low risk for diabetes. More often and at a younger age if you are overweight or have a high risk for diabetes. What should I know about preventing infection? Hepatitis B If you have a higher risk for hepatitis B, you should be screened for this virus. Talk with your health care provider to find out if you are at risk for hepatitis B infection. Hepatitis C Testing is recommended for: Everyone born from [birth year] through 1967. Anyone with known risk factors for hepatitis C. Sexually transmitted infections (STIs) Get screened for STIs, including gonorrhea and chlamydia, if: You are sexually active and are younger than 60 years of age. You are older than 60 years of age and your health care provider tells you that you are at risk for this type of infection. Your sexual activity has changed since you were last screened, and you are at increased risk for chlamydia or  gonorrhea. Ask your health care provider if you are at risk. Ask your health care provider about whether you are at high risk for HIV. Your health care provider may recommend a prescription medicine to help prevent HIV infection. If you choose to take medicine to prevent HIV, you should first get tested for HIV. You should then be tested every 3 months for as long as you are taking the  medicine. Pregnancy If you are about to stop having your period (premenopausal) and you may become pregnant, seek counseling before you get pregnant. Take 400 to 800 micrograms (mcg) of folic acid every day if you become pregnant. Ask for birth control (contraception) if you want to prevent pregnancy. Osteoporosis and menopause Osteoporosis is a disease in which the bones lose minerals and strength with aging. This can result in bone fractures. If you are 61 years old or older, or if you are at risk for osteoporosis and fractures, ask your health care provider if you should: Be screened for bone loss. Take a calcium  or vitamin D  supplement to lower your risk of fractures. Be given hormone replacement therapy (HRT) to treat symptoms of menopause. Follow these instructions at home: Alcohol use Do not drink alcohol if: Your health care provider tells you not to drink. You are pregnant, may be pregnant, or are planning to become pregnant. If you drink alcohol: Limit how much you have to: 0-1 drink a day. Know how much alcohol is in your drink. In the [place] one drink equals one 12 oz bottle of beer (355 mL), one 5 oz glass of wine (148 mL), or one 1 oz glass of hard liquor (44 mL). Lifestyle Do not use any products that contain nicotine or tobacco. These products include cigarettes, chewing tobacco, and vaping devices, such as e-cigarettes. If you need help quitting, ask your health care provider. Do not use street drugs. Do not share needles. Ask your health care provider for help if you need support or information about quitting drugs. General instructions Schedule regular health, dental, and eye exams. Stay current with your vaccines. Tell your health care provider if: You often feel depressed. You have ever been abused or do not feel safe at home. Summary Adopting a healthy lifestyle and getting preventive care are important in promoting health and wellness. Follow your health care  provider's instructions about healthy diet, exercising, and getting tested or screened for diseases. Follow your health care provider's instructions on monitoring your cholesterol and blood pressure. This information is not intended to replace advice given to you by your health care provider. Make sure you discuss any questions you have with your health care provider. Document Revised: 05/10/2020 Document Reviewed: 05/10/2020 Elsevier Patient Education  2024 [employer].

## 2023-07-03 NOTE — Assessment & Plan Note (Signed)
 Chronic Taking vitamin D daily Check vitamin D level

## 2023-07-04 ENCOUNTER — Ambulatory Visit: Payer: Self-pay | Admitting: Internal Medicine

## 2023-07-09 ENCOUNTER — Telehealth: Payer: Self-pay | Admitting: Hematology and Oncology

## 2023-07-09 NOTE — Telephone Encounter (Signed)
 Rescheule appointment per provider pal.  Called left VM with changes made to the upcoming appointment.

## 2023-07-11 ENCOUNTER — Other Ambulatory Visit: Payer: Self-pay

## 2023-07-11 MED ORDER — ATORVASTATIN CALCIUM 80 MG PO TABS: 80.0000 mg | ORAL_TABLET | Freq: Every day | ORAL | 1 refills | Status: DC

## 2023-07-12 ENCOUNTER — Other Ambulatory Visit: Payer: Self-pay | Admitting: Cardiology

## 2023-07-16 ENCOUNTER — Ambulatory Visit: Admitting: Emergency Medicine

## 2023-07-16 ENCOUNTER — Inpatient Hospital Stay: Payer: BC Managed Care – PPO | Admitting: Hematology and Oncology

## 2023-08-01 ENCOUNTER — Other Ambulatory Visit: Payer: BC Managed Care – PPO

## 2023-08-01 ENCOUNTER — Ambulatory Visit
Admission: RE | Admit: 2023-08-01 | Discharge: 2023-08-01 | Disposition: A | Discharge status: Home / Self Care | Payer: BC Managed Care – PPO | Source: Ambulatory Visit | Attending: Hematology and Oncology | Admitting: Hematology and Oncology

## 2023-08-01 DIAGNOSIS — C50212 Malignant neoplasm of upper-inner quadrant of left female breast: Secondary | ICD-10-CM

## 2023-08-01 NOTE — Progress Notes (Unsigned)
 [facility]   Telephone:([phone] Fax:([phone]) [phone]    Patient Care Team: Geofm Glade PARAS, MD as PCP - General (Internal Medicine) Pietro Redell RAMAN, MD as PCP - Cardiology (Cardiology) Dewey Rush, MD as Consulting Physician (Radiation Oncology) Kristie Lamprey, MD as Consulting Physician (Gastroenterology) Delana Ted Morrison, DO as Consulting Physician (Obstetrics and Gynecology)   CHIEF COMPLAINT: Follow up left breast cancer   Oncology History  Malignant neoplasm of upper-inner quadrant of left breast in female, estrogen receptor positive (HCC)  08/29/2018 Oncotype testing   The Oncotype DX score was 20 predicting a risk of outside the breast recurrence over the next 9 years of 6% if the patient's only systemic therapy is tamoxifen  for 5 years.    09/02/2018 Initial Diagnosis   Malignant neoplasm of upper-inner quadrant of left breast in female, estrogen receptor positive (HCC)   10/01/2018 Cancer Staging   Staging form: Breast, AJCC 8th Edition - Pathologic stage from 10/01/2018: Stage IA (pT1b, pN0, cM0, G2, ER+, PR+, HER2-, Oncotype DX score: 20)    10/01/2018 Surgery   left mastectomy [facility]) [phone]): pT1b pN0, stage IA invasive ductal carcinoma, grade 2, margins negative. ER+, PR+, HER2-. 5 lymph nodes were negative for carcinoma.   11/2018 - 11/2023 Anti-estrogen oral therapy   Tamoxifen       CURRENT THERAPY: Anastrozole    INTERVAL HISTORY Roberta [name] returns for follow up as scheduled. Last seen by Dr. Loretha 11/07/22. Doing well in the interim. Continues actively trying to lose weight.  Was told she has cysts in right breast, does not palpate a lump. Left chest wall gets tight with exercise, baseline since surgery. Tolerating anastrozole  and thinks she is nearing completion. Sees PCP, Gyn and is up to date on routine/age appropriate health care.   ROS  All other systems reviewed and negative  Past Medical History:  Diagnosis Date   AMI  (acute myocardial infarction) (HCC)    Back pain    Cancer (HCC)    multifocal left breast   Coronary artery disease    Depression    Hip pain    Hypertension    Obesity    PONV (postoperative nausea and vomiting)    Prediabetes    Swelling    bilat LE   Wears glasses      Past Surgical History:  Procedure Laterality Date   CHOLECYSTECTOMY     COLONOSCOPY     CORONARY BALLOON ANGIOPLASTY N/A 12/11/2019   Procedure: CORONARY BALLOON ANGIOPLASTY;  Surgeon: Mady Bruckner, MD;  Location: [facility];  Service: Cardiovascular;  Laterality: N/A;   DILATION AND CURETTAGE OF UTERUS     GASTRIC BYPASS     KNEE SURGERY  2002   Ligament repair   LEFT HEART CATH AND CORONARY ANGIOGRAPHY N/A 12/11/2019   Procedure: LEFT HEART CATH AND CORONARY ANGIOGRAPHY;  Surgeon: Mady Bruckner, MD;  Location: [facility];  Service: Cardiovascular;  Laterality: N/A;   MASTECTOMY Left 2019   MASTECTOMY W/ SENTINEL NODE BIOPSY Left 10/01/2018   Procedure: LEFT TOTAL MASTECTOMY WITH SENTINEL LYMPH NODE BIOPSY AND BLUE DYE INJECTION;  Surgeon: Gail Favorite, MD;  Location: [facility] OR;  Service: General;  Laterality: Left;     Outpatient Encounter Medications as of 08/02/2023  Medication Sig   anastrozole  (ARIMIDEX ) 1 MG tablet TAKE 1 TABLET BY MOUTH EVERY DAY   aspirin  EC 81 MG EC tablet Take 1 tablet (81 mg total) by mouth daily. Swallow whole.  atorvastatin  (LIPITOR ) 80 MG tablet Take 1 tablet (80 mg total) by mouth daily.   Ferrous Sulfate (IRON  PO) Take 65 mg by mouth daily.   furosemide  (LASIX ) 20 MG tablet TAKE 1 TABLET BY MOUTH EVERY DAY   metoprolol  tartrate (LOPRESSOR ) 25 MG tablet TAKE 1/2 TABLET TWICE A DAY BY MOUTH   Multiple Vitamin (MULTIVITAMIN) tablet Take 1 tablet by mouth daily.   nitroGLYCERIN  (NITROSTAT ) 0.4 MG SL tablet Place 1 tablet (0.4 mg total) under the tongue every 5 (five) minutes x 3 doses as needed for chest pain.   No facility-administered encounter  medications on file as of 08/02/2023.     Today's Vitals   08/02/23 1127  BP: 130/80  Pulse: 62  Resp: 17  Temp: 98.1 F (36.7 C)  SpO2: 98%  Weight: 293 lb 1.6 oz (132.9 kg)   Body mass index is 47.31 kg/m.   ECOG PERFORMANCE STATUS: 0 - Asymptomatic  PHYSICAL EXAM GENERAL:alert, no distress and comfortable SKIN: no rash  EYES: sclera clear NECK: without mass LYMPH:  no palpable cervical or supraclavicular lymphadenopathy  LUNGS: clear with normal breathing effort HEART: regular rate & rhythm, no lower extremity edema ABDOMEN: abdomen soft, non-tender and normal bowel sounds NEURO: alert & oriented x 3 with fluent speech, no focal motor/sensory deficits Breast exam: s/p left mastectomy, incisions completely healed, no palpable mass or nodularity in the right breast, left chest wall, or either axilla that I could appreciate    CBC    Latest Ref Rng & Units 07/03/2023    2:49 PM 06/20/2023    9:27 AM 03/21/2023   10:20 PM  CBC  WBC 4.0 - 10.5 K/uL 5.6  3.9  5.4   Hemoglobin 12.0 - 15.0 g/dL 85.5  85.3  86.3   Hematocrit 36.0 - 46.0 % 43.7  44.3  42.1   Platelets 150.0 - 400.0 K/uL 185.0  219  176       CMP     Latest Ref Rng & Units 07/03/2023    2:49 PM 06/20/2023    9:27 AM 03/21/2023   10:20 PM  CMP  Glucose 70 - 99 mg/dL 87  85  841   BUN 6 - 23 mg/dL 22  22  22    Creatinine 0.40 - 1.20 mg/dL 8.89  9.01  9.03   Sodium 135 - 145 mEq/L 140  142  141   Potassium 3.5 - 5.1 mEq/L 3.9  4.4  3.4   Chloride 96 - 112 mEq/L 105  105  107   CO2 19 - 32 mEq/L 29  22  29    Calcium  8.4 - 10.5 mg/dL 8.9  9.0  8.7   Total Protein 6.0 - 8.3 g/dL 7.6     Total Bilirubin 0.2 - 1.2 mg/dL 0.5     Alkaline Phos 39 - 117 U/L 105     AST 0 - 37 U/L 32     ALT 0 - 35 U/L 31         ASSESSMENT & PLAN: 58 y.o. 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 A total of 5 lymph nodes biopsied were negative.   -Oncotype  score of 20 predicts a risk of recurrence outside the breast within the next 9 years of 6% if her only systemic therapy is antiestrogens for 5 years.  Also predicts no benefit from chemotherapy. -Adjuvant radiation not indicated (due to node negative mastectomy) -Tamoxifen  started November 2020, switched to anastrozole  beginning March 2022 following NSTEMI and angioplasty December 2021 -Roberta. Sookdeo is clinically doing well. Tolerating Anastrozole , exam is benign, recent labs are normal. Overall no clinical concern for recurrence.  -Will follow up the R screening mammogram from 08/01/23.  -She has nearly completed 5 years surveillance and anti-estrogen therapy. We discussed the potential benefit of continuing AI for additional 2 years. She would like to stop now when current supply runs out. Due to her low risk features and good prognostic panel, that is reasonable.  -I offered her to continue annual surveillance follow up in our clinic, she prefers to f/up with us  if needed. She sees PCP and ob/gyn.      PLAN: -Recent labs reviewed -R screening mammo 08/01/23 result is pending -Completed 5 years cancer surveillance and AI, pt prefers to f/up with PCP/gyn -F/up with us  if needed in the future   All questions were answered. The patient knows to call the clinic with any problems, questions or concerns. No barriers to learning were detected.  Roberta Hattery K Philis Doke, NP 08/02/2023

## 2023-08-02 ENCOUNTER — Inpatient Hospital Stay: Attending: Nurse Practitioner | Admitting: Nurse Practitioner

## 2023-08-02 ENCOUNTER — Encounter: Payer: Self-pay | Admitting: Nurse Practitioner

## 2023-08-02 VITALS — BP 130/80 | HR 62 | Temp 98.1°F | Resp 17 | Wt 293.1 lb

## 2023-08-02 DIAGNOSIS — Z1732 Human epidermal growth factor receptor 2 negative status: Secondary | ICD-10-CM | POA: Insufficient documentation

## 2023-08-02 DIAGNOSIS — Z9012 Acquired absence of left breast and nipple: Secondary | ICD-10-CM | POA: Diagnosis not present

## 2023-08-02 DIAGNOSIS — Z1721 Progesterone receptor positive status: Secondary | ICD-10-CM | POA: Insufficient documentation

## 2023-08-02 DIAGNOSIS — Z170 Estrogen receptor positive status [ER+]: Secondary | ICD-10-CM | POA: Diagnosis not present

## 2023-08-02 DIAGNOSIS — Z79811 Long term (current) use of aromatase inhibitors: Secondary | ICD-10-CM | POA: Diagnosis not present

## 2023-08-02 DIAGNOSIS — C50212 Malignant neoplasm of upper-inner quadrant of left female breast: Secondary | ICD-10-CM | POA: Insufficient documentation

## 2023-08-09 ENCOUNTER — Ambulatory Visit: Admitting: Hematology and Oncology

## 2023-08-31 ENCOUNTER — Other Ambulatory Visit: Payer: Self-pay

## 2023-08-31 ENCOUNTER — Encounter: Payer: Self-pay | Admitting: Internal Medicine

## 2023-08-31 MED ORDER — ATORVASTATIN CALCIUM 80 MG PO TABS: 80.0000 mg | ORAL_TABLET | Freq: Every day | ORAL | 1 refills | Status: AC

## 2023-10-10 ENCOUNTER — Other Ambulatory Visit: Payer: Self-pay | Admitting: Internal Medicine

## 2023-10-10 ENCOUNTER — Other Ambulatory Visit: Payer: Self-pay | Admitting: Cardiology

## 2023-11-05 ENCOUNTER — Encounter: Payer: Self-pay | Admitting: Radiology

## 2023-11-26 ENCOUNTER — Encounter: Payer: Self-pay | Admitting: Internal Medicine

## 2023-11-27 ENCOUNTER — Other Ambulatory Visit: Payer: Self-pay

## 2023-11-27 DIAGNOSIS — Z13820 Encounter for screening for osteoporosis: Secondary | ICD-10-CM

## 2023-12-26 ENCOUNTER — Ambulatory Visit
Admission: RE | Admit: 2023-12-26 | Discharge: 2023-12-26 | Disposition: A | Source: Ambulatory Visit | Attending: Internal Medicine

## 2023-12-26 DIAGNOSIS — Z13820 Encounter for screening for osteoporosis: Secondary | ICD-10-CM

## 2023-12-29 ENCOUNTER — Ambulatory Visit: Payer: Self-pay | Admitting: Internal Medicine

## 2024-01-04 ENCOUNTER — Ambulatory Visit: Admitting: Internal Medicine

## 2024-01-20 ENCOUNTER — Encounter: Payer: Self-pay | Admitting: Internal Medicine

## 2024-01-20 DIAGNOSIS — N1831 Chronic kidney disease, stage 3a: Secondary | ICD-10-CM | POA: Insufficient documentation

## 2024-01-20 NOTE — Progress Notes (Unsigned)
" ° ° ° ° °  Subjective:    Patient ID: Roberta [name], female    DOB: [birth year], 59 y.o.   MRN: [id number]     HPI Kaysie is here for follow up of her chronic medical problems.  Mild ckd, obesity  Medications and allergies reviewed with patient and updated if appropriate.  Medications Ordered Prior to Encounter[1]   Review of Systems     Objective:  There were no vitals filed for this visit. BP Readings from Last 3 Encounters:  08/02/23 130/80  07/03/23 128/70  06/20/23 (!) 149/96   Wt Readings from Last 3 Encounters:  08/02/23 293 lb 1.6 oz (132.9 kg)  07/03/23 290 lb (131.5 kg)  04/19/23 281 lb 12.8 oz (127.8 kg)   There is no height or weight on file to calculate BMI.    Physical Exam     Lab Results  Component Value Date   WBC 5.6 07/03/2023   HGB 14.4 07/03/2023   HCT 43.7 07/03/2023   PLT 185.0 07/03/2023   GLUCOSE 87 07/03/2023   CHOL 115 07/03/2023   TRIG 84.0 07/03/2023   HDL 61.80 07/03/2023   LDLCALC 37 07/03/2023   ALT 31 07/03/2023   AST 32 07/03/2023   NA 140 07/03/2023   K 3.9 07/03/2023   CL 105 07/03/2023   CREATININE 1.10 07/03/2023   BUN 22 07/03/2023   CO2 29 07/03/2023   TSH 1.56 07/03/2023   HGBA1C 6.1 07/03/2023     Assessment & Plan:    See Problem List for Assessment and Plan of chronic medical problems.       [1]  Current Outpatient Medications on File Prior to Visit  Medication Sig Dispense Refill   aspirin  EC 81 MG EC tablet Take 1 tablet (81 mg total) by mouth daily. Swallow whole. 30 tablet 11   atorvastatin  (LIPITOR ) 80 MG tablet Take 1 tablet (80 mg total) by mouth daily. 90 tablet 1   Ferrous Sulfate (IRON  PO) Take 65 mg by mouth daily.     furosemide  (LASIX ) 20 MG tablet TAKE 1 TABLET BY MOUTH EVERY DAY 90 tablet 1   metoprolol  tartrate (LOPRESSOR ) 25 MG tablet Take 0.5 tablets (12.5 mg total) by mouth 2 (two) times daily. 90 tablet 1   Multiple Vitamin (MULTIVITAMIN) tablet Take 1 tablet by mouth daily.      nitroGLYCERIN  (NITROSTAT ) 0.4 MG SL tablet Place 1 tablet (0.4 mg total) under the tongue every 5 (five) minutes x 3 doses as needed for chest pain. 25 tablet 5   No current facility-administered medications on file prior to visit.   "

## 2024-01-20 NOTE — Patient Instructions (Addendum)
" ° ° °  Pneumonia vaccine given today    Blood work was ordered.       Medications changes include :   None      Return in about 6 months (around 07/20/2024) for Physical Exam.  "

## 2024-01-21 ENCOUNTER — Ambulatory Visit: Admitting: Internal Medicine

## 2024-01-21 VITALS — BP 124/78 | HR 70 | Temp 98.1°F | Ht 66.0 in | Wt 275.0 lb

## 2024-01-21 DIAGNOSIS — Z9861 Coronary angioplasty status: Secondary | ICD-10-CM | POA: Diagnosis not present

## 2024-01-21 DIAGNOSIS — Z23 Encounter for immunization: Secondary | ICD-10-CM | POA: Diagnosis not present

## 2024-01-21 DIAGNOSIS — E559 Vitamin D deficiency, unspecified: Secondary | ICD-10-CM

## 2024-01-21 DIAGNOSIS — R600 Localized edema: Secondary | ICD-10-CM

## 2024-01-21 DIAGNOSIS — E66813 Obesity, class 3: Secondary | ICD-10-CM

## 2024-01-21 DIAGNOSIS — R7303 Prediabetes: Secondary | ICD-10-CM

## 2024-01-21 DIAGNOSIS — Z6841 Body Mass Index (BMI) 40.0 and over, adult: Secondary | ICD-10-CM | POA: Diagnosis not present

## 2024-01-21 DIAGNOSIS — I2510 Atherosclerotic heart disease of native coronary artery without angina pectoris: Secondary | ICD-10-CM | POA: Diagnosis not present

## 2024-01-21 DIAGNOSIS — K9089 Other intestinal malabsorption: Secondary | ICD-10-CM | POA: Diagnosis not present

## 2024-01-21 DIAGNOSIS — I10 Essential (primary) hypertension: Secondary | ICD-10-CM | POA: Diagnosis not present

## 2024-01-21 DIAGNOSIS — N1831 Chronic kidney disease, stage 3a: Secondary | ICD-10-CM | POA: Diagnosis not present

## 2024-01-21 LAB — LIPID PANEL
Cholesterol: 105 mg/dL (ref 28–200)
HDL: 60.5 mg/dL
LDL Cholesterol: 32 mg/dL (ref 10–99)
NonHDL: 44.39
Total CHOL/HDL Ratio: 2
Triglycerides: 63 mg/dL (ref 10.0–149.0)
VLDL: 12.6 mg/dL (ref 0.0–40.0)

## 2024-01-21 LAB — CBC
HCT: 42.7 % (ref 36.0–46.0)
Hemoglobin: 14.1 g/dL (ref 12.0–15.0)
MCHC: 32.9 g/dL (ref 30.0–36.0)
MCV: 84.5 fl (ref 78.0–100.0)
Platelets: 193 K/uL (ref 150.0–400.0)
RBC: 5.06 Mil/uL (ref 3.87–5.11)
RDW: 13.9 % (ref 11.5–15.5)
WBC: 4.6 K/uL (ref 4.0–10.5)

## 2024-01-21 LAB — COMPREHENSIVE METABOLIC PANEL WITH GFR
ALT: 32 U/L (ref 3–35)
AST: 33 U/L (ref 5–37)
Albumin: 3.8 g/dL (ref 3.5–5.2)
Alkaline Phosphatase: 108 U/L (ref 39–117)
BUN: 18 mg/dL (ref 6–23)
CO2: 29 meq/L (ref 19–32)
Calcium: 8.9 mg/dL (ref 8.4–10.5)
Chloride: 103 meq/L (ref 96–112)
Creatinine, Ser: 0.94 mg/dL (ref 0.40–1.20)
GFR: 65.8 mL/min
Glucose, Bld: 103 mg/dL — ABNORMAL HIGH (ref 70–99)
Potassium: 3.3 meq/L — ABNORMAL LOW (ref 3.5–5.1)
Sodium: 139 meq/L (ref 135–145)
Total Bilirubin: 0.7 mg/dL (ref 0.2–1.2)
Total Protein: 7.7 g/dL (ref 6.0–8.3)

## 2024-01-21 LAB — HEMOGLOBIN A1C: Hgb A1c MFr Bld: 5.8 % (ref 4.6–6.5)

## 2024-01-21 LAB — VITAMIN D 25 HYDROXY (VIT D DEFICIENCY, FRACTURES): VITD: 34.8 ng/mL (ref 30.00–100.00)

## 2024-01-21 LAB — VITAMIN B12: Vitamin B-12: 680 pg/mL (ref 211–911)

## 2024-01-21 NOTE — Assessment & Plan Note (Signed)
 Chronic Lab Results  Component Value Date   HGBA1C 6.1 07/03/2023   Check A1c Continue diabetic diet, regular exercise Continue weight loss efforts

## 2024-01-21 NOTE — Assessment & Plan Note (Signed)
 Chronic Blood pressure controlled CMP, cbc Stressed low sodium diet, exercise, weight loss, which she is currently doing Continue  metoprolol  12.5 mg twice daily, lasix  20 mg daily for edema

## 2024-01-21 NOTE — Assessment & Plan Note (Signed)
 Chronic Taking vitamin D daily Check vitamin D level

## 2024-01-21 NOTE — Assessment & Plan Note (Signed)
 Chronic  Mild CKD-stage 3a, GFR varies Stressed good BP, sugar control Stressed good water intake, low sugar/sodium diet, weight loss Avoid NSAIDs

## 2024-01-21 NOTE — Assessment & Plan Note (Signed)
 Taking an MVI Check B12, D

## 2024-01-21 NOTE — Assessment & Plan Note (Addendum)
 Chronic Hx of NSTEMI, S/p PTCA  On metoprolol  12.5 mg twice daily, atorvastatin  80 mg daily, ASA Following with cardiology Denies any symptoms consistent with angina Exercising, working on weight loss and has been successful-continue efforts BP well controlled, sugars controlled

## 2024-01-21 NOTE — Assessment & Plan Note (Signed)
Chronic Controlled Likely venous insufficiency Continue Lasix 20 mg daily Stressed low sodium diet, exercise and weight loss Advised compression socks as much as possible

## 2024-01-21 NOTE — Assessment & Plan Note (Signed)
 Chronic BMI 44.3 With comorbidities of prediabetes, hypertension, CAD, hyperlipidemia Has been going to the gym regularly and eating better and has successfully lost weight She will continue her efforts-stressed how important the weight loss is

## 2024-01-22 ENCOUNTER — Encounter: Payer: Self-pay | Admitting: Internal Medicine

## 2024-01-23 MED ORDER — POTASSIUM CHLORIDE CRYS ER 20 MEQ PO TBCR: 20.0000 meq | EXTENDED_RELEASE_TABLET | Freq: Every day | ORAL | 3 refills | Status: AC

## 2024-01-24 ENCOUNTER — Ambulatory Visit: Payer: Self-pay | Admitting: Internal Medicine

## 2024-01-29 ENCOUNTER — Encounter: Payer: Self-pay | Admitting: Internal Medicine

## 2024-07-07 ENCOUNTER — Encounter: Admitting: Internal Medicine

## 2024-07-22 ENCOUNTER — Encounter: Admitting: Internal Medicine
